# Patient Record
Sex: Male | Born: 1940 | Race: White | Hispanic: No | Marital: Married | Smoking: Former smoker
Health system: Southern US, Community
[De-identification: ages and names within clinical notes are randomized; demographics above are authoritative.]

## PROBLEM LIST (undated history)

## (undated) DIAGNOSIS — I2510 Atherosclerotic heart disease of native coronary artery without angina pectoris: Secondary | ICD-10-CM

## (undated) DIAGNOSIS — N19 Unspecified kidney failure: Secondary | ICD-10-CM

## (undated) DIAGNOSIS — N186 End stage renal disease: Secondary | ICD-10-CM

## (undated) DIAGNOSIS — Z992 Dependence on renal dialysis: Secondary | ICD-10-CM

## (undated) DIAGNOSIS — N289 Disorder of kidney and ureter, unspecified: Secondary | ICD-10-CM

## (undated) DIAGNOSIS — I351 Nonrheumatic aortic (valve) insufficiency: Secondary | ICD-10-CM

## (undated) DIAGNOSIS — E785 Hyperlipidemia, unspecified: Secondary | ICD-10-CM

## (undated) DIAGNOSIS — I10 Essential (primary) hypertension: Secondary | ICD-10-CM

## (undated) HISTORY — DX: Nonrheumatic aortic (valve) insufficiency: I35.1

## (undated) HISTORY — PX: FOOT SURGERY: SHX648

## (undated) HISTORY — DX: Hyperlipidemia, unspecified: E78.5

## (undated) HISTORY — PX: CHOLECYSTECTOMY: SHX55

## (undated) HISTORY — DX: Disorder of kidney and ureter, unspecified: N28.9

## (undated) HISTORY — DX: Essential (primary) hypertension: I10

## (undated) HISTORY — PX: HERNIA REPAIR: SHX51

## (undated) HISTORY — PX: APPENDECTOMY: SHX54

---

## 2009-05-24 HISTORY — PX: US ECHOCARDIOGRAPHY: HXRAD669

## 2009-06-08 HISTORY — PX: CARDIOVASCULAR STRESS TEST: SHX262

## 2009-09-10 ENCOUNTER — Emergency Department (HOSPITAL_BASED_OUTPATIENT_CLINIC_OR_DEPARTMENT_OTHER): Admission: EM | Admit: 2009-09-10 | Discharge: 2009-09-10 | Payer: Self-pay | Admitting: Emergency Medicine

## 2009-09-10 ENCOUNTER — Ambulatory Visit: Payer: Self-pay | Admitting: Interventional Radiology

## 2009-12-26 ENCOUNTER — Ambulatory Visit: Payer: Self-pay | Admitting: Cardiology

## 2010-03-28 ENCOUNTER — Ambulatory Visit: Payer: Self-pay | Admitting: Cardiology

## 2010-05-21 LAB — DIFFERENTIAL
Basophils Absolute: 0 10*3/uL (ref 0.0–0.1)
Basophils Relative: 1 % (ref 0–1)
Lymphs Abs: 1.8 10*3/uL (ref 0.7–4.0)
Monocytes Relative: 6 % (ref 3–12)

## 2010-05-21 LAB — POCT CARDIAC MARKERS: Troponin i, poc: <0.05 ng/mL (ref 0.00–0.09)

## 2010-05-21 LAB — BASIC METABOLIC PANEL
BUN: 18 mg/dL (ref 6–23)
CO2: 25 mEq/L (ref 19–32)
Chloride: 106 mEq/L (ref 96–112)
Creatinine, Ser: 1.7 mg/dL — ABNORMAL HIGH (ref 0.4–1.5)

## 2010-05-21 LAB — CBC
MCH: 32.9 pg (ref 26.0–34.0)
MCV: 97.7 fL (ref 78.0–100.0)
RDW: 11.9 % (ref 11.5–15.5)
WBC: 6.3 10*3/uL (ref 4.0–10.5)

## 2010-08-14 ENCOUNTER — Other Ambulatory Visit: Payer: Self-pay | Admitting: Cardiology

## 2010-08-14 MED ORDER — LOSARTAN POTASSIUM-HCTZ 100-25 MG PO TABS: 1.0000 | ORAL_TABLET | Freq: Every day | ORAL | Status: DC

## 2010-08-14 MED ORDER — AMLODIPINE BESYLATE 5 MG PO TABS: 5.0000 mg | ORAL_TABLET | Freq: Every day | ORAL | Status: DC

## 2010-08-14 NOTE — Telephone Encounter (Signed)
escribe medication per fax request  

## 2010-08-14 NOTE — Telephone Encounter (Signed)
PT NEEDS AMLODIPINE AND LOSARTAN REFILLED/USES [facility] AT [address] IN [place].

## 2010-09-27 ENCOUNTER — Encounter: Payer: Self-pay | Admitting: Cardiology

## 2010-09-29 ENCOUNTER — Encounter: Payer: Self-pay | Admitting: Cardiology

## 2010-09-29 ENCOUNTER — Ambulatory Visit (INDEPENDENT_AMBULATORY_CARE_PROVIDER_SITE_OTHER): Payer: Medicare Other | Admitting: Cardiology

## 2010-09-29 DIAGNOSIS — I359 Nonrheumatic aortic valve disorder, unspecified: Secondary | ICD-10-CM

## 2010-09-29 DIAGNOSIS — I10 Essential (primary) hypertension: Secondary | ICD-10-CM | POA: Insufficient documentation

## 2010-09-29 DIAGNOSIS — I351 Nonrheumatic aortic (valve) insufficiency: Secondary | ICD-10-CM

## 2010-09-29 DIAGNOSIS — E785 Hyperlipidemia, unspecified: Secondary | ICD-10-CM

## 2010-09-29 DIAGNOSIS — N289 Disorder of kidney and ureter, unspecified: Secondary | ICD-10-CM

## 2010-09-29 NOTE — Progress Notes (Signed)
   William [name] Date of Birth: [birth year]   History of Present Illness: William [name] is seen today for followup. He has done very well from a cardiac standpoint denies any symptoms of chest pain, shortness of breath, or palpitations. His blood pressure has been under good control. He states he is scheduled to see nephrology back in September and will have a renal ultrasound at that time.  Current Outpatient Prescriptions on File Prior to Visit  Medication Sig Dispense Refill  . amLODipine (NORVASC) 5 MG tablet Take 1 tablet (5 mg total) by mouth daily.  30 tablet  5  . Cholecalciferol (VITAMIN D PO) Take 200 Units by mouth daily.        . clonazePAM (KLONOPIN) 0.5 MG tablet Take 0.5 mg by mouth at bedtime.        . ranitidine (ZANTAC) 75 MG tablet Take 150 mg by mouth at bedtime.        Marland Kitchen DISCONTD: losartan-hydrochlorothiazide (HYZAAR) 100-25 MG per tablet Take 1 tablet by mouth daily.  30 tablet  5    Allergies  Allergen Reactions  . Lisinopril     Past Medical History  Diagnosis Date  . Aortic insufficiency   . Hypertension   . Renal insufficiency     WITH PROTEINURIA  . Hyperlipidemia     Past Surgical History  Procedure Date  . Cholecystectomy   . Appendectomy   . Hernia repair   . Foot surgery   . US echocardiography 05/24/2009    EF 60%  . Cardiovascular stress test 06/08/2009    EF 43%    History  Smoking status  . Former Smoker  . Quit date: 03/05/1966  Smokeless tobacco  . Not on file    History  Alcohol Use No    Family History  Problem Relation Age of Onset  . Cancer Mother   . Cirrhosis Father   . Diabetes Brother     Review of Systems: As noted in history of present illness.  All other systems were reviewed and are negative.  Physical Exam: BP 132/78  Pulse 60  Ht 6' (1.829 m)  Wt 179 lb (81.194 kg)  BMI 24.28 kg/m2 He is a pleasant white male in no acute distress. He is normocephalic, atraumatic. Pupils are round and reactive to light  accommodation. Extraocular movements are full. Oropharynx is clear. Neck is supple without JVD, adenopathy, thyromegaly, or bruits. Lungs are clear. Cardiac exam reveals a regular rate and rhythm. Normal S1-S2. No gallop, murmur, or click. Abdomen is soft and nontender. He has no hepatosplenomegaly. Femoral and pedal pulses are 2+. He has no edema. Neurologic exam is nonfocal. LABORATORY DATA:   Assessment / Plan:

## 2010-09-29 NOTE — Patient Instructions (Signed)
Continue your current medications.  We will see you again in 6 months.

## 2010-09-29 NOTE — Assessment & Plan Note (Signed)
Blood pressure control is excellent on his current medication.

## 2010-09-29 NOTE — Assessment & Plan Note (Signed)
Moderate aortic insufficiency was noted by an echocardiogram in March of 2011. His exam is benign. He is asymptomatic. His blood pressure control is optimal. Will continue on his current therapy and consider followup echocardiogram in one year.

## 2010-10-05 ENCOUNTER — Telehealth: Payer: Self-pay | Admitting: *Deleted

## 2010-10-05 NOTE — Telephone Encounter (Signed)
Faxed office note to Cristina Gong at [facility] center at [phone]

## 2010-11-29 ENCOUNTER — Emergency Department (INDEPENDENT_AMBULATORY_CARE_PROVIDER_SITE_OTHER): Payer: Medicare Other

## 2010-11-29 ENCOUNTER — Emergency Department (HOSPITAL_BASED_OUTPATIENT_CLINIC_OR_DEPARTMENT_OTHER)
Admission: EM | Admit: 2010-11-29 | Discharge: 2010-11-29 | Disposition: A | Discharge status: Home / Self Care | Payer: Medicare Other | Attending: Emergency Medicine | Admitting: Emergency Medicine

## 2010-11-29 ENCOUNTER — Encounter (HOSPITAL_BASED_OUTPATIENT_CLINIC_OR_DEPARTMENT_OTHER): Payer: Self-pay | Admitting: *Deleted

## 2010-11-29 ENCOUNTER — Other Ambulatory Visit: Payer: Self-pay

## 2010-11-29 DIAGNOSIS — R079 Chest pain, unspecified: Secondary | ICD-10-CM | POA: Insufficient documentation

## 2010-11-29 DIAGNOSIS — I359 Nonrheumatic aortic valve disorder, unspecified: Secondary | ICD-10-CM

## 2010-11-29 DIAGNOSIS — R0789 Other chest pain: Secondary | ICD-10-CM

## 2010-11-29 DIAGNOSIS — I10 Essential (primary) hypertension: Secondary | ICD-10-CM

## 2010-11-29 LAB — BASIC METABOLIC PANEL
CO2: 24 mEq/L (ref 19–32)
Calcium: 9.5 mg/dL (ref 8.4–10.5)
Chloride: 101 mEq/L (ref 96–112)
Creatinine, Ser: 1.9 mg/dL — ABNORMAL HIGH (ref 0.50–1.35)
GFR calc Af Amer: 43 mL/min — ABNORMAL LOW (ref >60)
GFR calc non Af Amer: 35 mL/min — ABNORMAL LOW (ref >60)
Glucose, Bld: 89 mg/dL (ref 70–99)
Potassium: 4.2 mEq/L (ref 3.5–5.1)

## 2010-11-29 LAB — CARDIAC PANEL(CRET KIN+CKTOT+MB+TROPI)
CK, MB: 3.2 ng/mL (ref 0.3–4.0)
Total CK: 182 U/L (ref 7–232)

## 2010-11-29 LAB — CBC
RBC: 4.29 MIL/uL (ref 4.22–5.81)
WBC: 6.5 10*3/uL (ref 4.0–10.5)

## 2010-11-29 NOTE — ED Notes (Signed)
Pt sts "normal heart rate is in the 50s".

## 2010-11-29 NOTE — ED Provider Notes (Signed)
History     CSN: [id number] Arrival date & time: 11/29/2010 12:44 PM  Chief Complaint  Patient presents with  . Chest Pain    (Consider location/radiation/quality/duration/timing/severity/associated sxs/prior treatment) HPI Comments: Patient presents with intermittent left-sided chest pain since 9 AM this morning. He notes that it actually improves with walking around. He was driving around in his truck when it started and he took a Zantac. He noted some symptom improvement but his symptoms did return. He did take another Zantac. At this point in time he has no chest pain. He has not had symptoms quite like this before. He has no associated shortness of breath, nausea, diaphoresis. No recent cough or fevers. No new leg swelling or leg pain.  Patient is a 70 y.o. male presenting with chest pain. The history is provided by the patient. No language interpreter was used.  Chest Pain The chest pain began 3 - 5 hours ago. Chest pain occurs intermittently. The chest pain is resolved. The severity of the pain is mild. The quality of the pain is described as aching. The pain does not radiate. Pertinent negatives for primary symptoms include no fever, no fatigue, no syncope, no shortness of breath, no cough, no wheezing, no palpitations, no abdominal pain, no nausea, no vomiting, no dizziness and no altered mental status.  Associated symptoms include weakness.  Pertinent negatives for associated symptoms include no claudication, no diaphoresis, no lower extremity edema, no near-syncope, no numbness, no orthopnea and no paroxysmal nocturnal dyspnea. He tried histamine-2 blockers for the symptoms. Risk factors include male gender.     Past Medical History  Diagnosis Date  . Aortic insufficiency   . Hypertension   . Renal insufficiency     WITH PROTEINURIA  . Hyperlipidemia     Past Surgical History  Procedure Date  . Cholecystectomy   . Appendectomy   . Hernia repair   . Foot surgery   . US  echocardiography 05/24/2009    EF 60%  . Cardiovascular stress test 06/08/2009    EF 43%    Family History  Problem Relation Age of Onset  . Cancer Mother   . Cirrhosis Father   . Diabetes Brother     History  Substance Use Topics  . Smoking status: Former Smoker    Quit date: 03/05/1966  . Smokeless tobacco: Not on file  . Alcohol Use: No      Review of Systems  Constitutional: Negative for fever, chills, diaphoresis and fatigue.  HENT: Negative.   Eyes: Negative.  Negative for discharge and redness.  Respiratory: Negative.  Negative for cough, shortness of breath and wheezing.   Cardiovascular: Positive for chest pain. Negative for palpitations, orthopnea, claudication, syncope and near-syncope.  Gastrointestinal: Negative.  Negative for nausea, vomiting and abdominal pain.  Genitourinary: Negative.  Negative for hematuria.  Musculoskeletal: Negative.  Negative for back pain.  Skin: Negative.  Negative for color change and rash.  Neurological: Positive for weakness. Negative for dizziness, syncope, numbness and headaches.       Patient notes some generalized weakness but no focal weakness.  Hematological: Negative.  Negative for adenopathy.  Psychiatric/Behavioral: Negative.  Negative for confusion and altered mental status.  All other systems reviewed and are negative.    Allergies  Lisinopril  Home Medications   Current Outpatient Rx  Name Route Sig Dispense Refill  . AMLODIPINE BESYLATE 5 MG PO TABS Oral Take 1 tablet (5 mg total) by mouth daily. 30 tablet 5  . VITAMIN D  PO Oral Take 200 Units by mouth daily.      Marland Kitchen CLONAZEPAM 0.5 MG PO TABS Oral Take 0.5 mg by mouth at bedtime.      Marland Kitchen LOSARTAN POTASSIUM-HCTZ 100-25 MG PO TABS Oral Take 1 tablet by mouth daily. Takes 1/2 tablet daily     . RANITIDINE HCL 75 MG PO TABS Oral Take 150 mg by mouth at bedtime.        BP 163/72  Pulse 56  Temp(Src) 98.2 F (36.8 C) (Oral)  Resp 18  Ht 6' (1.829 m)  Wt 172 lb  (78.019 kg)  BMI 23.33 kg/m2  SpO2 100%  Physical Exam  Constitutional: He is oriented to person, place, and time. He appears well-developed and well-nourished.  Non-toxic appearance. He does not have a sickly appearance.  HENT:  Head: Normocephalic and atraumatic.  Eyes: Conjunctivae, EOM and lids are normal. Pupils are equal, round, and reactive to light.  Neck: Trachea normal, normal range of motion and full passive range of motion without pain. Neck supple.  Cardiovascular: Normal rate, regular rhythm and normal heart sounds.   Pulmonary/Chest: Effort normal and breath sounds normal. No respiratory distress. He has no wheezes. He has no rales. He exhibits no tenderness.  Abdominal: Soft. Normal appearance. He exhibits no distension. There is no tenderness. There is no rebound and no CVA tenderness.  Musculoskeletal: Normal range of motion.  Neurological: He is alert and oriented to person, place, and time. He has normal strength.  Skin: Skin is warm, dry and intact. No rash noted.  Psychiatric: He has a normal mood and affect. His behavior is normal. Judgment and thought content normal.    ED Course  Procedures (including critical care time) Results for orders placed during the hospital encounter of 11/29/10  CBC      Component Value Range   WBC 6.5  4.0 - 10.5 (K/uL)   RBC 4.29  4.22 - 5.81 (MIL/uL)   Hemoglobin 14.2  13.0 - 17.0 (g/dL)   HCT 40.7  39.0 - 52.0 (%)   MCV 94.9  78.0 - 100.0 (fL)   MCH 33.1  26.0 - 34.0 (pg)   MCHC 34.9  30.0 - 36.0 (g/dL)   RDW 12.1  11.5 - 15.5 (%)   Platelets 202  150 - 400 (K/uL)  CARDIAC PANEL(CRET KIN+CKTOT+MB+TROPI)      Component Value Range   Total CK 182  7 - 232 (U/L)   CK, MB 3.2  0.3 - 4.0 (ng/mL)   Troponin I <0.30  <0.30 (ng/mL)   Relative Index 1.8  0.0 - 2.5   BASIC METABOLIC PANEL      Component Value Range   Sodium 136  135 - 145 (mEq/L)   Potassium 4.2  3.5 - 5.1 (mEq/L)   Chloride 101  96 - 112 (mEq/L)   CO2 24  19 -  32 (mEq/L)   Glucose, Bld 89  70 - 99 (mg/dL)   BUN 29 (*) 6 - 23 (mg/dL)   Creatinine, Ser 1.90 (*) 0.50 - 1.35 (mg/dL)   Calcium 9.5  8.4 - 10.5 (mg/dL)   GFR calc non Af Amer 35 (*) >60 (mL/min)   GFR calc Af Amer 43 (*) >60 (mL/min)   Dg Chest 2 View  11/29/2010  *RADIOLOGY REPORT*  Clinical Data: Left-sided chest pain.  Hypertension.  Aortic insufficiency.  CHEST - 2 VIEW  Comparison: 09/10/2009  Findings: Heart size is within normal limits.  Both lungs are clear.  No  evidence of pleural effusion.  No mass or lymphadenopathy identified.  No significant change seen compared to prior exam.  IMPRESSION: Stable exam.  No active disease.  Original Report Authenticated By: Marlaine Hind, M.D.       Date: 11/29/2010  Rate: 54  Rhythm: sinus bradycardia  QRS Axis: normal  Intervals: normal  ST/T Wave abnormalities: normal  Conduction Disutrbances:none  Narrative Interpretation:   Old EKG Reviewed: none available    MDM  Patient with symptoms that are atypical for ACS. They actually improve with exertion and are intermittent in origin. Patient also has no past history of coronary artery disease or MI. They are not typical for PE as they are not pleuritic and he has no specific risk factors by Wells criteria for PE. Patient has no symptoms for pneumonia. His symptoms are not characteristic for aortic dissection. Patient's EKG is normal and shows no signs of acute ischemia. Patient actually has no pain at the current time. He may have symptoms related to his known acid reflux disease. I will check a set of cardiac markers to further rule out MI but again this is unlikely.   Patient has normal laboratory studies at this point in time except an elevated BUN and creatinine. Evaluation of past medical records patient had creatinine of 1.7 on 09/10/2009. Patient does see a nephrologist regarding his renal insufficiency so this is not a new abnormality for him. Patient has normal cardiac markers  and no anemia. He has no acute changes on his chest x-ray. As noted previously this is unlikely to be ACS patient has had no pain while here in the emergency department. His symptoms may be due to acid reflux and at this point in time I feel he is safe for discharge home with followup with his primary care physician and/or his cardiologist as an outpatient.     Lezlie Octave, MD 11/29/10 [phone]

## 2010-11-29 NOTE — ED Notes (Signed)
Pt declines w/c, amb to room 7 with quick steady gait in nad. Reports sudden onset of sharp left sided chest pain at 9am, intermittant, improves with activity. Denies any sob or other c/o.

## 2010-11-29 NOTE — ED Notes (Signed)
Pt denies pain, shob, dizziness, n/v at present

## 2011-03-08 ENCOUNTER — Ambulatory Visit (INDEPENDENT_AMBULATORY_CARE_PROVIDER_SITE_OTHER): Payer: Medicare Other | Admitting: Cardiology

## 2011-03-08 ENCOUNTER — Encounter: Payer: Self-pay | Admitting: Cardiology

## 2011-03-08 VITALS — BP 120/60 | HR 55 | Ht 72.0 in | Wt 183.4 lb

## 2011-03-08 DIAGNOSIS — I10 Essential (primary) hypertension: Secondary | ICD-10-CM

## 2011-03-08 DIAGNOSIS — N289 Disorder of kidney and ureter, unspecified: Secondary | ICD-10-CM

## 2011-03-08 DIAGNOSIS — I359 Nonrheumatic aortic valve disorder, unspecified: Secondary | ICD-10-CM

## 2011-03-08 DIAGNOSIS — I351 Nonrheumatic aortic (valve) insufficiency: Secondary | ICD-10-CM

## 2011-03-08 MED ORDER — LOSARTAN POTASSIUM-HCTZ 100-25 MG PO TABS: 0.5000 | ORAL_TABLET | Freq: Every day | ORAL | Status: DC

## 2011-03-08 MED ORDER — AMLODIPINE BESYLATE 5 MG PO TABS: 5.0000 mg | ORAL_TABLET | Freq: Every day | ORAL | Status: DC

## 2011-03-08 NOTE — Assessment & Plan Note (Signed)
Blood pressure is under excellent control. I have renewed his losartan HCT and amlodipine today.

## 2011-03-08 NOTE — Progress Notes (Signed)
   William [name] Date of Birth: [birth year]   History of Present Illness: William [name] is seen today for followup. He has done very well from a cardiac standpoint denies any symptoms of chest pain, shortness of breath, or palpitations. His blood pressure has been under good control. He states he is saw nephrology back in September and that his creatinine was stable. His most recent lab work showed a creatinine of 2.1. He had complete lab work done in December was told that everything else looked okay. He remains active walking on a treadmill 3 days a week. He is also active on his job.  Current Outpatient Prescriptions on File Prior to Visit  Medication Sig Dispense Refill  . Cholecalciferol (VITAMIN D PO) Take 200 Units by mouth daily.        . ranitidine (ZANTAC) 75 MG tablet Take 150 mg by mouth at bedtime.        Marland Kitchen DISCONTD: amLODipine (NORVASC) 5 MG tablet Take 1 tablet (5 mg total) by mouth daily.  30 tablet  5  . DISCONTD: losartan-hydrochlorothiazide (HYZAAR) 100-25 MG per tablet Take by mouth daily. Takes 1/2 tablet daily      . DISCONTD: losartan-hydrochlorothiazide (HYZAAR) 100-25 MG per tablet Take 1 tablet by mouth daily.  30 tablet  5    Allergies  Allergen Reactions  . Lisinopril     Past Medical History  Diagnosis Date  . Aortic insufficiency   . Hypertension   . Renal insufficiency     WITH PROTEINURIA, creatnine 2.1  . Hyperlipidemia     Past Surgical History  Procedure Date  . Cholecystectomy   . Appendectomy   . Hernia repair   . Foot surgery   . US echocardiography 05/24/2009    EF 60%  . Cardiovascular stress test 06/08/2009    EF 43%    History  Smoking status  . Former Smoker  . Quit date: 03/05/1966  Smokeless tobacco  . Not on file    History  Alcohol Use No    Family History  Problem Relation Age of Onset  . Cancer Mother   . Cirrhosis Father   . Diabetes Brother     Review of Systems: As noted in history of present illness.  All  other systems were reviewed and are negative.  Physical Exam: BP 120/60  Pulse 55  Ht 6' (1.829 m)  Wt 183 lb 6.4 oz (83.19 kg)  BMI 24.87 kg/m2 He is a pleasant white male in no acute distress. He is normocephalic, atraumatic. Pupils are round and reactive to light accommodation. Extraocular movements are full. Oropharynx is clear. Neck is supple without JVD, adenopathy, thyromegaly, or bruits. Lungs are clear. Cardiac exam reveals a regular rate and rhythm. Normal S1-S2. No gallop, murmur, or click. Abdomen is soft and nontender. He has no hepatosplenomegaly. Femoral and pedal pulses are 2+. He has no edema. Neurologic exam is nonfocal. LABORATORY DATA:   Assessment / Plan:

## 2011-03-08 NOTE — Assessment & Plan Note (Signed)
Creatinine stable at 2.1.

## 2011-03-08 NOTE — Patient Instructions (Signed)
We will schedule you for an Echocardiogram.  Continue your current medication.  I will see you again in 6 months.

## 2011-03-08 NOTE — Assessment & Plan Note (Signed)
He has a history of moderate aortic insufficiency with last echocardiogram in March of 2011. We will repeat his echocardiogram at this time. Continue with his current antihypertensive therapy.

## 2011-03-12 ENCOUNTER — Other Ambulatory Visit (HOSPITAL_COMMUNITY): Payer: Medicare Other | Admitting: Radiology

## 2011-03-16 ENCOUNTER — Other Ambulatory Visit (HOSPITAL_COMMUNITY): Payer: Medicare Other | Admitting: Radiology

## 2011-03-23 ENCOUNTER — Other Ambulatory Visit (HOSPITAL_COMMUNITY): Payer: Medicare Other | Admitting: Radiology

## 2011-03-29 ENCOUNTER — Ambulatory Visit (HOSPITAL_COMMUNITY): Payer: Medicare Other | Attending: Cardiology | Admitting: Radiology

## 2011-03-29 DIAGNOSIS — Z87891 Personal history of nicotine dependence: Secondary | ICD-10-CM | POA: Insufficient documentation

## 2011-03-29 DIAGNOSIS — I351 Nonrheumatic aortic (valve) insufficiency: Secondary | ICD-10-CM

## 2011-03-29 DIAGNOSIS — I10 Essential (primary) hypertension: Secondary | ICD-10-CM | POA: Insufficient documentation

## 2011-03-29 DIAGNOSIS — E785 Hyperlipidemia, unspecified: Secondary | ICD-10-CM | POA: Insufficient documentation

## 2011-03-29 DIAGNOSIS — I359 Nonrheumatic aortic valve disorder, unspecified: Secondary | ICD-10-CM

## 2013-10-15 DIAGNOSIS — R972 Elevated prostate specific antigen [PSA]: Secondary | ICD-10-CM | POA: Insufficient documentation

## 2013-10-15 DIAGNOSIS — N401 Enlarged prostate with lower urinary tract symptoms: Secondary | ICD-10-CM | POA: Insufficient documentation

## 2013-10-26 DIAGNOSIS — M810 Age-related osteoporosis without current pathological fracture: Secondary | ICD-10-CM | POA: Insufficient documentation

## 2014-07-01 DIAGNOSIS — N186 End stage renal disease: Secondary | ICD-10-CM | POA: Insufficient documentation

## 2014-07-01 DIAGNOSIS — N184 Chronic kidney disease, stage 4 (severe): Secondary | ICD-10-CM | POA: Insufficient documentation

## 2014-07-21 DIAGNOSIS — M79604 Pain in right leg: Secondary | ICD-10-CM | POA: Insufficient documentation

## 2014-12-30 ENCOUNTER — Encounter (HOSPITAL_BASED_OUTPATIENT_CLINIC_OR_DEPARTMENT_OTHER): Payer: Self-pay | Admitting: Emergency Medicine

## 2014-12-30 ENCOUNTER — Emergency Department (HOSPITAL_BASED_OUTPATIENT_CLINIC_OR_DEPARTMENT_OTHER): Payer: Medicare HMO

## 2014-12-30 ENCOUNTER — Emergency Department (HOSPITAL_BASED_OUTPATIENT_CLINIC_OR_DEPARTMENT_OTHER)
Admission: EM | Admit: 2014-12-30 | Discharge: 2014-12-30 | Disposition: A | Discharge status: Home / Self Care | Payer: Medicare HMO | Attending: Emergency Medicine | Admitting: Emergency Medicine

## 2014-12-30 DIAGNOSIS — J159 Unspecified bacterial pneumonia: Secondary | ICD-10-CM | POA: Insufficient documentation

## 2014-12-30 DIAGNOSIS — R1013 Epigastric pain: Secondary | ICD-10-CM

## 2014-12-30 DIAGNOSIS — R0602 Shortness of breath: Secondary | ICD-10-CM | POA: Diagnosis present

## 2014-12-30 DIAGNOSIS — Z87448 Personal history of other diseases of urinary system: Secondary | ICD-10-CM | POA: Insufficient documentation

## 2014-12-30 DIAGNOSIS — Z79899 Other long term (current) drug therapy: Secondary | ICD-10-CM | POA: Insufficient documentation

## 2014-12-30 DIAGNOSIS — K219 Gastro-esophageal reflux disease without esophagitis: Secondary | ICD-10-CM | POA: Insufficient documentation

## 2014-12-30 DIAGNOSIS — E785 Hyperlipidemia, unspecified: Secondary | ICD-10-CM | POA: Diagnosis not present

## 2014-12-30 DIAGNOSIS — J189 Pneumonia, unspecified organism: Secondary | ICD-10-CM

## 2014-12-30 DIAGNOSIS — I10 Essential (primary) hypertension: Secondary | ICD-10-CM | POA: Insufficient documentation

## 2014-12-30 DIAGNOSIS — Z87891 Personal history of nicotine dependence: Secondary | ICD-10-CM | POA: Insufficient documentation

## 2014-12-30 LAB — URINALYSIS, ROUTINE W REFLEX MICROSCOPIC
BILIRUBIN URINE: NEGATIVE
GLUCOSE, UA: NEGATIVE mg/dL
KETONES UR: NEGATIVE mg/dL
NITRITE: NEGATIVE
PH: 5.5 (ref 5.0–8.0)
Protein, ur: 100 mg/dL — AB
SPECIFIC GRAVITY, URINE: 1.013 (ref 1.005–1.030)
Urobilinogen, UA: 0.2 mg/dL (ref 0.0–1.0)

## 2014-12-30 LAB — CBC
HCT: 38.2 % — ABNORMAL LOW (ref 39.0–52.0)
Hemoglobin: 13 g/dL (ref 13.0–17.0)
MCH: 33.2 pg (ref 26.0–34.0)
MCHC: 34 g/dL (ref 30.0–36.0)
MCV: 97.7 fL (ref 78.0–100.0)
PLATELETS: 241 10*3/uL (ref 150–400)
RBC: 3.91 MIL/uL — AB (ref 4.22–5.81)
RDW: 12.1 % (ref 11.5–15.5)
WBC: 8 10*3/uL (ref 4.0–10.5)

## 2014-12-30 LAB — URINE MICROSCOPIC-ADD ON

## 2014-12-30 LAB — HEPATIC FUNCTION PANEL
ALK PHOS: 50 U/L (ref 38–126)
ALT: 18 U/L (ref 17–63)
AST: 25 U/L (ref 15–41)
Albumin: 4.1 g/dL (ref 3.5–5.0)
BILIRUBIN INDIRECT: 0.7 mg/dL (ref 0.3–0.9)
BILIRUBIN TOTAL: 0.8 mg/dL (ref 0.3–1.2)
Bilirubin, Direct: 0.1 mg/dL (ref 0.1–0.5)
TOTAL PROTEIN: 6.9 g/dL (ref 6.5–8.1)

## 2014-12-30 LAB — BASIC METABOLIC PANEL
Anion gap: 8 (ref 5–15)
BUN: 34 mg/dL — AB (ref 6–20)
CALCIUM: 8.6 mg/dL — AB (ref 8.9–10.3)
CHLORIDE: 107 mmol/L (ref 101–111)
CO2: 24 mmol/L (ref 22–32)
CREATININE: 2.55 mg/dL — AB (ref 0.61–1.24)
GFR calc non Af Amer: 23 mL/min — ABNORMAL LOW (ref >60)
GFR, EST AFRICAN AMERICAN: 27 mL/min — AB (ref >60)
GLUCOSE: 97 mg/dL (ref 65–99)
Potassium: 4 mmol/L (ref 3.5–5.1)
Sodium: 139 mmol/L (ref 135–145)

## 2014-12-30 LAB — TROPONIN I

## 2014-12-30 LAB — LIPASE, BLOOD: Lipase: 31 U/L (ref 11–51)

## 2014-12-30 LAB — I-STAT CG4 LACTIC ACID, ED: Lactic Acid, Venous: 0.87 mmol/L (ref 0.5–2.0)

## 2014-12-30 MED ORDER — ONDANSETRON HCL 4 MG/2ML IJ SOLN
4.0000 mg | Freq: Once | INTRAMUSCULAR | Status: AC
  Administered 2014-12-30: 4 mg via INTRAVENOUS
  Filled 2014-12-30: qty 2

## 2014-12-30 MED ORDER — LEVOFLOXACIN 750 MG PO TABS: 750.0000 mg | ORAL_TABLET | Freq: Every day | ORAL | Status: DC

## 2014-12-30 MED ORDER — HYDROMORPHONE HCL 1 MG/ML IJ SOLN
0.5000 mg | Freq: Once | INTRAMUSCULAR | Status: AC
  Administered 2014-12-30: 0.5 mg via INTRAVENOUS
  Filled 2014-12-30: qty 1

## 2014-12-30 MED ORDER — LEVOFLOXACIN 750 MG PO TABS
750.0000 mg | ORAL_TABLET | Freq: Every day | ORAL | Status: DC
  Administered 2014-12-30: 750 mg via ORAL
  Filled 2014-12-30: qty 1

## 2014-12-30 MED ORDER — SODIUM CHLORIDE 0.9 % IV BOLUS (SEPSIS)
1000.0000 mL | Freq: Once | INTRAVENOUS | Status: AC
  Administered 2014-12-30: 1000 mL via INTRAVENOUS

## 2014-12-30 MED ORDER — DEXAMETHASONE SODIUM PHOSPHATE 10 MG/ML IJ SOLN
10.0000 mg | Freq: Once | INTRAMUSCULAR | Status: AC
  Administered 2014-12-30: 10 mg via INTRAVENOUS
  Filled 2014-12-30: qty 1

## 2014-12-30 MED ORDER — OXYCODONE-ACETAMINOPHEN 5-325 MG PO TABS
1.0000 | ORAL_TABLET | Freq: Once | ORAL | Status: AC
  Administered 2014-12-30: 1 via ORAL
  Filled 2014-12-30: qty 1

## 2014-12-30 MED ORDER — ONDANSETRON 4 MG PO TBDP: 4.0000 mg | ORAL_TABLET | Freq: Three times a day (TID) | ORAL | Status: DC | PRN

## 2014-12-30 MED ORDER — OXYCODONE-ACETAMINOPHEN 5-325 MG PO TABS: 1.0000 | ORAL_TABLET | ORAL | Status: DC | PRN

## 2014-12-30 NOTE — Discharge Instructions (Signed)

## 2014-12-30 NOTE — ED Notes (Signed)
Pt c/o SOB with throat tightening x 1 hour-received PNE booster this am-pt NAD but is coughing

## 2014-12-30 NOTE — ED Notes (Signed)
Patient with no pain at this time. The patient states that it resolved when he got up and walked around.

## 2014-12-31 NOTE — ED Provider Notes (Signed)
CSN: [id number]     Arrival date & time 12/30/14  1451 History   First MD Initiated Contact with Patient 12/30/14 1500     Chief Complaint  Patient presents with  . Shortness of Breath     (Consider location/radiation/quality/duration/timing/severity/associated sxs/prior Treatment) HPI Comments: 74 year old male with past medical history including hypertension, HLD, CKD, aortic insufficiency who presents with throat pain and nausea. Patient states that this morning at 10 AM he had a pneumonia booster and flu vaccination. This afternoon around 2 PM, he was working in his garage when he had a sudden onset of his throat feeling raw with a foreign body sensation when he swallowed. He also began having nausea. He came in for concern for allergic reaction to the medications. He denies any chest pain, shortness of breath, rash, or focal swelling. No abdominal pain or vomiting. 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 He reports several weeks of cough/cold symptoms for which his PCP has written a Z-Pak but patient has not started it yet. No fevers. He has had these vaccinations in the past with no difficulty.  Patient is a 74 y.o. male presenting with shortness of breath. The history is provided by the patient.  Shortness of Breath   Past Medical History  Diagnosis Date  . Aortic insufficiency   . Hypertension   . Renal insufficiency     WITH PROTEINURIA, creatnine 2.1  . Hyperlipidemia    Past Surgical History  Procedure Laterality Date  . Cholecystectomy    . Appendectomy    . Hernia repair    . Foot surgery    . US echocardiography  05/24/2009    EF 60%  . Cardiovascular stress test  06/08/2009    EF 43%   Family History  Problem Relation Age of Onset  . Cancer Mother   . Cirrhosis Father   . Diabetes Brother    Social History  Substance Use Topics  . Smoking status: Former Smoker    Quit date: 03/05/1966  . Smokeless tobacco: None  . Alcohol Use: No    Review of Systems   Respiratory: Positive for shortness of breath.     10 Systems reviewed and are negative for acute change except as noted in the HPI.   Allergies  Lisinopril  Home Medications   Prior to Admission medications   Medication Sig Start Date End Date Taking? Authorizing Provider  hydrochlorothiazide (MICROZIDE) 12.5 MG capsule Take 12.5 mg by mouth daily.   Yes Historical Provider, MD  losartan (COZAAR) 50 MG tablet Take 50 mg by mouth daily.   Yes Historical Provider, MD  omeprazole (PRILOSEC) 20 MG capsule Take 20 mg by mouth daily.   Yes Historical Provider, MD  tadalafil (CIALIS) 5 MG tablet Take 5 mg by mouth daily as needed for erectile dysfunction.   Yes Historical Provider, MD  amLODipine (NORVASC) 5 MG tablet Take 1 tablet (5 mg total) by mouth daily. 03/08/11 03/07/12  Peter M Martinique, MD  Cholecalciferol (VITAMIN D PO) Take 200 Units by mouth daily.      Historical Provider, MD  levofloxacin (LEVAQUIN) 750 MG tablet Take 1 tablet (750 mg total) by mouth daily. 12/30/14   Sharlett Iles, MD  losartan-hydrochlorothiazide (HYZAAR) 100-25 MG per tablet Take 0.5 tablets by mouth daily. Takes 1/2 tablet daily 03/08/11 03/07/12  Peter M Martinique, MD  ondansetron (ZOFRAN ODT) 4 MG disintegrating tablet Take 1 tablet (4 mg total) by mouth every 8 (eight) hours as needed for nausea or  vomiting. 12/30/14   Sharlett Iles, MD  oxyCODONE-acetaminophen (PERCOCET) 5-325 MG tablet Take 1-2 tablets by mouth every 4 (four) hours as needed. 12/30/14   Sharlett Iles, MD  ranitidine (ZANTAC) 75 MG tablet Take 150 mg by mouth at bedtime.      Historical Provider, MD   BP 139/76 mmHg  Pulse 68  Temp(Src) 99.1 F (37.3 C) (Oral)  Resp 20  Ht 6' (1.829 m)  Wt 175 lb (79.379 kg)  BMI 23.73 kg/m2  SpO2 95% Physical Exam  Constitutional: He is oriented to person, place, and time. He appears well-developed and well-nourished. No distress.  HENT:  Head: Normocephalic and atraumatic.  Nose:  Nose normal.  Mouth/Throat: Oropharynx is clear and moist.  Moist mucous membranes  Eyes: Conjunctivae are normal. Pupils are equal, round, and reactive to light.  Neck: Neck supple. No tracheal deviation present.  Cardiovascular: Normal rate, regular rhythm and normal heart sounds.   No murmur heard. Pulmonary/Chest: Effort normal and breath sounds normal. No stridor.  Abdominal: Soft. Bowel sounds are normal. He exhibits no distension. There is no tenderness.  Musculoskeletal: He exhibits no edema.  Neurological: He is alert and oriented to person, place, and time.  Fluent speech  Skin: Skin is warm and dry. No rash noted.  Psychiatric: He has a normal mood and affect. Judgment normal.  Nursing note and vitals reviewed.   ED Course  Procedures (including critical care time) Labs Review Labs Reviewed  BASIC METABOLIC PANEL - Abnormal; Notable for the following:    BUN 34 (*)    Creatinine, Ser 2.55 (*)    Calcium 8.6 (*)    GFR calc non Af Amer 23 (*)    GFR calc Af Amer 27 (*)    All other components within normal limits  CBC - Abnormal; Notable for the following:    RBC 3.91 (*)    HCT 38.2 (*)    All other components within normal limits  URINALYSIS, ROUTINE W REFLEX MICROSCOPIC (NOT AT [facility]) - Abnormal; Notable for the following:    APPearance CLOUDY (*)    Hgb urine dipstick TRACE (*)    Protein, ur 100 (*)    Leukocytes, UA LARGE (*)    All other components within normal limits  URINE MICROSCOPIC-ADD ON - Abnormal; Notable for the following:    Bacteria, UA FEW (*)    All other components within normal limits  TROPONIN I  LIPASE, BLOOD  HEPATIC FUNCTION PANEL  I-STAT CG4 LACTIC ACID, ED    Imaging Review Ct Abdomen Pelvis Wo Contrast  12/30/2014  ADDENDUM REPORT: 12/30/2014 19:35 ADDENDUM: Small parapelvic cysts are noted in both kidneys. There is also small exophytic cyst arising from the lower pole of the left kidney. Otherwise, the kidneys have normal  appearances. Electronically Signed   By: Claudie Revering M.D.   On: 12/30/2014 19:35  12/30/2014  CLINICAL DATA:  Sudden onset severe epigastric abdominal pain and nausea. Cough with some shortness of breath for the past 3 weeks. Increased density at the right lung base on chest radiographs earlier today. EXAM: CT ABDOMEN AND PELVIS WITHOUT CONTRAST TECHNIQUE: Multidetector CT imaging of the abdomen and pelvis was performed following the standard protocol without IV contrast. COMPARISON:  Chest radiographs obtained earlier today. FINDINGS: Small amount of patchy and linear density at the right lung base, not included in its entirety. Cholecystectomy clips. Calcified splenic granulomata. Unremarkable non contrasted appearance of the liver, pancreas, adrenal glands, ureters and urinary  bladder. Moderately enlarged prostate gland. Multiple sigmoid colon diverticula. No gastric or small bowel abnormalities. Atheromatous arterial calcifications. Lumbar and lower thoracic spine degenerative changes. IMPRESSION: 1. Small amount of atelectasis and probable pneumonia at the right lung base. 2. No acute abdomen or pelvic abnormality. 3. Sigmoid diverticulosis. 4. Moderately enlarged prostate gland. Electronically Signed: By: Claudie Revering M.D. On: 12/30/2014 17:32   Dg Chest 2 View  12/30/2014  CLINICAL DATA:  Shortness of breath with throat tightening. Cough for 3 weeks. EXAM: CHEST  2 VIEW COMPARISON:  11/29/2010 FINDINGS: Slightly prominent lung markings at the right lung base could represent normal markings versus mild atelectasis. No significant airspace disease. Heart and mediastinum are within normal limits. Trachea is midline. No pleural effusions. No acute bone abnormality. IMPRESSION: Few densities at the right lung base as described. No significant airspace disease. Electronically Signed   By: Markus Daft M.D.   On: 12/30/2014 15:41   I have personally reviewed and evaluated these lab results as part of my  medical decision-making.   EKG Interpretation   Date/Time:  Thursday December 30 2014 15:05:38 EDT Ventricular Rate:  69 PR Interval:  158 QRS Duration: 108 QT Interval:  420 QTC Calculation: 450 R Axis:   26 Text Interpretation:  Normal sinus rhythm Normal ECG No significant change  since last tracing Confirmed by LITTLE MD, RACHEL [id number]) on 12/30/2014  4:04:51 PM     Medications  ondansetron (ZOFRAN) injection 4 mg (4 mg Intravenous Given 12/30/14 1545)  dexamethasone (DECADRON) injection 10 mg (10 mg Intravenous Given 12/30/14 1546)  ondansetron (ZOFRAN) injection 4 mg (4 mg Intravenous Given 12/30/14 1632)  HYDROmorphone (DILAUDID) injection 0.5 mg (0.5 mg Intravenous Given 12/30/14 1633)  sodium chloride 0.9 % bolus 1,000 mL (0 mLs Intravenous Stopped 12/30/14 1944)  HYDROmorphone (DILAUDID) injection 0.5 mg (0.5 mg Intravenous Given 12/30/14 1742)  oxyCODONE-acetaminophen (PERCOCET/ROXICET) 5-325 MG per tablet 1 tablet (1 tablet Oral Given 12/30/14 1947)    MDM   Final diagnoses:  Community acquired pneumonia  Epigastric pain   74 year old male who presents with sudden onset of throat soreness and nausea that began this afternoon in the setting of receiving vaccinations this morning. Patient uncomfortable but in no acute distress at presentation. Vital signs unremarkable. O2 sat 100% on room air. No hives or facial edema noted on exam. EKG was unremarkable. Chest x-ray without acute process. Because of the patient's description of scratchy throat and nausea, ddx does include reaction to medications. Gave pt decadron.   Shortly after my evaluation, the patient began having severe midepigastric abdominal pain and was in moderate distress on exam. Gave him Zofran and Dilaudid and obtained above labs. Cr 2.5; pt aware of worsening CKD. Otherwise troponin, lipase, and LFTs normal. Because of severity of patient's upper abdominal pain and sudden onset, obtained CT to evaluate for  acute process such as obstruction. Also obtained a lactate to evaluate for mesenteric ischemia. Lactate was normal and CT showed no intra-abdominal process. CT did show probable right lower lung pneumonia. Gave the patient Levaquin. On reexamination, the patient was comfortable and stated that his symptoms had improved. He has demonstrated no vital sign abnormalities, rash, or angioedema to suggest anaphylaxis. I have provided him with Levaquin instead of z-pack given comorbidities and instructed to follow closely with PCP as well as nephrologist. Instructed to use Benadryl and Pepcid at home for the next few days in the event that he did have an adverse reaction to medication. Extensively reviewed return  precautions and patient voice understanding. Patient discharged in satisfactory condition.    Sharlett Iles, MD 12/31/14 [phone]

## 2015-02-16 ENCOUNTER — Encounter: Payer: Self-pay | Admitting: Podiatry

## 2015-02-16 ENCOUNTER — Ambulatory Visit (INDEPENDENT_AMBULATORY_CARE_PROVIDER_SITE_OTHER): Payer: Medicare HMO

## 2015-02-16 ENCOUNTER — Ambulatory Visit (INDEPENDENT_AMBULATORY_CARE_PROVIDER_SITE_OTHER): Payer: Medicare HMO | Admitting: Podiatry

## 2015-02-16 VITALS — BP 131/66 | HR 66 | Resp 16

## 2015-02-16 DIAGNOSIS — M8430XA Stress fracture, unspecified site, initial encounter for fracture: Secondary | ICD-10-CM | POA: Diagnosis not present

## 2015-02-16 DIAGNOSIS — M722 Plantar fascial fibromatosis: Secondary | ICD-10-CM | POA: Diagnosis not present

## 2015-02-16 DIAGNOSIS — L84 Corns and callosities: Secondary | ICD-10-CM

## 2015-02-16 DIAGNOSIS — M79673 Pain in unspecified foot: Secondary | ICD-10-CM

## 2015-02-16 MED ORDER — TRIAMCINOLONE ACETONIDE 10 MG/ML IJ SUSP
10.0000 mg | Freq: Once | INTRAMUSCULAR | Status: AC
  Administered 2015-02-16: 10 mg

## 2015-02-16 NOTE — Patient Instructions (Signed)

## 2015-02-16 NOTE — Progress Notes (Signed)
   Subjective:    Patient ID: William [name], male    DOB: [birth year], 74 y.o.   MRN: [id number]  HPI  Pt presents with right foot pain in heel and dorsal forefoot lasting 1 month, worse in the mornings. He also has a painful callus on the left foot sub 5th met  Review of Systems  All other systems reviewed and are negative.      Objective:   Physical Exam        Assessment & Plan:

## 2015-02-17 NOTE — Progress Notes (Signed)
Subjective:     Patient ID: William [name], male   DOB: 03-26-1940, 74 y.o.   MRN: [id number]  HPI patient presents stating I'm having a lot of pain in my right heel for a couple months and over the last couple weeks I have developed exquisite tenderness in the top of my right foot with swelling and also I have lesions on my fifth metatarsal of both feet.   Review of Systems  All other systems reviewed and are negative.      Objective:   Physical Exam  Constitutional: He is oriented to person, place, and time.  Cardiovascular: Intact distal pulses.   Musculoskeletal: Normal range of motion.  Neurological: He is oriented to person, place, and time.  Skin: Skin is warm and dry.  Nursing note and vitals reviewed.  neurovascular status found to be intact muscle strength adequate range of motion within normal limits with patient noted to have exquisite discomfort of the plantar heel right at the insertional point tendon into the calcaneus with significant edema in the forefoot right centered around the third metatarsal neck. Also found have keratotic lesion fifth metatarsal left over right negative Homan sign was noted and good digital perfusion was noted     Assessment:      acute plantar fasciitis right with probable change in gait and strong possibility for stress fracture right along with keratotic lesion formation    Plan:      H&P and x-rays reviewed with patient. Today I injected the plantar fascia right 3 mg Kenalog 5 mill grams Xylocaine reviewed x-rays and applied air fracture walker due to fracture of the neck of the third metatarsal right. Debrided lesions and reappoint 3 weeks and dispensed Ace wrap for compression and instructed on the fact this will probably take 8-12 weeks to heal completely

## 2015-03-16 ENCOUNTER — Encounter: Payer: Self-pay | Admitting: Podiatry

## 2015-03-16 ENCOUNTER — Ambulatory Visit (INDEPENDENT_AMBULATORY_CARE_PROVIDER_SITE_OTHER): Payer: Medicare HMO | Admitting: Podiatry

## 2015-03-16 ENCOUNTER — Ambulatory Visit (INDEPENDENT_AMBULATORY_CARE_PROVIDER_SITE_OTHER): Payer: Medicare HMO

## 2015-03-16 DIAGNOSIS — M8430XA Stress fracture, unspecified site, initial encounter for fracture: Secondary | ICD-10-CM

## 2015-03-16 DIAGNOSIS — M79673 Pain in unspecified foot: Secondary | ICD-10-CM

## 2015-03-18 NOTE — Progress Notes (Signed)
Subjective:     Patient ID: William [name], male   DOB: [birth year], 75 y.o.   MRN: [id number]  HPI patient states I'm doing quite a bit better still have swelling and pain but it is improved markedly   Review of Systems     Objective:   Physical Exam Neurovascular status intact with improvement second metatarsal shaft with pain still present upon deep palpation and mild plantar heel pain    Assessment:     Stress fracture right doing well with healing occurring and plantar fasciitis right    Plan:     X-ray taken and advised on ice therapy and continued boot usage with reduced usage over the next several weeks

## 2015-07-27 DIAGNOSIS — K591 Functional diarrhea: Secondary | ICD-10-CM | POA: Insufficient documentation

## 2015-08-08 DIAGNOSIS — I340 Nonrheumatic mitral (valve) insufficiency: Secondary | ICD-10-CM | POA: Insufficient documentation

## 2015-10-26 DIAGNOSIS — L989 Disorder of the skin and subcutaneous tissue, unspecified: Secondary | ICD-10-CM | POA: Insufficient documentation

## 2016-06-25 ENCOUNTER — Ambulatory Visit (INDEPENDENT_AMBULATORY_CARE_PROVIDER_SITE_OTHER): Payer: Medicare HMO | Admitting: Orthopaedic Surgery

## 2016-06-25 ENCOUNTER — Ambulatory Visit (INDEPENDENT_AMBULATORY_CARE_PROVIDER_SITE_OTHER): Payer: Medicare HMO

## 2016-06-25 ENCOUNTER — Encounter (INDEPENDENT_AMBULATORY_CARE_PROVIDER_SITE_OTHER): Payer: Self-pay | Admitting: Orthopaedic Surgery

## 2016-06-25 VITALS — BP 153/72 | HR 54 | Resp 12 | Ht 72.0 in | Wt 185.0 lb

## 2016-06-25 DIAGNOSIS — M25562 Pain in left knee: Secondary | ICD-10-CM

## 2016-06-25 MED ORDER — BUPIVACAINE HCL 0.5 % IJ SOLN
3.0000 mL | INTRAMUSCULAR | Status: AC | PRN
  Administered 2016-06-25: 3 mL via INTRA_ARTICULAR

## 2016-06-25 MED ORDER — LIDOCAINE HCL 1 % IJ SOLN
5.0000 mL | INTRAMUSCULAR | Status: AC | PRN
  Administered 2016-06-25: 5 mL

## 2016-06-25 MED ORDER — METHYLPREDNISOLONE ACETATE 40 MG/ML IJ SUSP
80.0000 mg | INTRAMUSCULAR | Status: AC | PRN
  Administered 2016-06-25: 80 mg

## 2016-06-25 NOTE — Progress Notes (Signed)
Office Visit Note   Patient: William [name]           Date of Birth: 12-Mar-1940           MRN: [id number] Visit Date: 06/25/2016              Requested by: Loraine Leriche, MD [place], [place] [address] PCP: Pcp Not In System   Assessment & Plan: Visit Diagnoses:  1. Left knee pain, unspecified chronicity   Osteoarthritis left knee  Plan: Long discussion regarding the arthritis in different treatment options. We'll try a cortisone injection today. Have discussed follow-up cortisone injections and even Visco supplementation and supplement with NSAIDs  Follow-Up Instructions: Return if symptoms worsen or fail to improve.   Orders:  Orders Placed This Encounter  Procedures  . XR KNEE 3 VIEW LEFT   No orders of the defined types were placed in this encounter.     Procedures: Large Joint Inj Date/Time: 06/25/2016 11:33 AM Performed by: Garald Balding Authorized by: Garald Balding   Consent Given by:  Patient Timeout: prior to procedure the correct patient, procedure, and site was verified   Indications:  Pain and joint swelling Location:  Knee Site:  L knee Prep: patient was prepped and draped in usual sterile fashion   Needle Size:  25 G Needle Length:  1.5 inches Approach:  Anteromedial Ultrasound Guidance: No   Fluoroscopic Guidance: No   Arthrogram: No   Medications:  5 mL lidocaine 1 %; 80 mg methylPREDNISolone acetate 40 MG/ML; 3 mL bupivacaine 0.5 % Aspiration Attempted: No   Patient tolerance:  Patient tolerated the procedure well with no immediate complications     Clinical Data: No additional findings.   Subjective: Chief Complaint  Patient presents with  . Left Knee - Pain    William [name] is a 76 y o that presents with L knee pain x 1 month. 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 He experienced insidious onset of pain approximately 1 month ago.  Pain is localized more along the anterior than either the medial lateral compartments. He feels like he's had slow but is "swelling". He has tried some over-the-counter medicines that seemed to help "a little bit HPI  Review of Systems   Objective: Vital Signs: BP (!) 153/72   Pulse (!) 54   Resp 12   Ht 6' (1.829 m)   Wt 185 lb (83.9 kg)   BMI 25.09 kg/m   Physical Exam  Ortho Exam left knee exam demonstrates positive patellar crepitation. Some pain with patella motion. Small palpable plica that's not symptomatic. Minimal medial joint pain. Mild increased varus. No pain laterally. Lacks just a few degrees to full extension and flexes over 105. No popliteal pain. No calf pain or swelling. +1 pulses distally. Foot was warm.  Specialty Comments:  No specialty comments available.  Imaging: Xr Knee 3 View Left  Result Date: 06/25/2016 Films of the left knee retained 3 projections standing. There is decrease in the medial joint space compared to the lateral joint space. 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 Small osteophytes are identified in the medial compartment on both femur and tibia to a lesser extent degenerative changes are identified the patellofemoral joint and lateral compartment    PMFS History: Patient Active Problem List   Diagnosis Date Noted  . Aortic insufficiency   .  Hypertension   . Renal insufficiency   . Hyperlipidemia    Past Medical History:  Diagnosis Date  . Aortic insufficiency   . Hyperlipidemia   . Hypertension   . Renal insufficiency    WITH PROTEINURIA, creatnine 2.1    Family History  Problem Relation Age of Onset  . Cancer Mother   . Cirrhosis Father   . Diabetes Brother     Past Surgical History:  Procedure Laterality Date  . APPENDECTOMY    . CARDIOVASCULAR STRESS TEST  06/08/2009   EF 43%  . CHOLECYSTECTOMY    . FOOT SURGERY    . HERNIA REPAIR    . US ECHOCARDIOGRAPHY  05/24/2009     EF 60%   Social History   Occupational History  . general contractor Retired   Social History Main Topics  . Smoking status: Former Smoker    Quit date: 03/05/1966  . Smokeless tobacco: Never Used  . Alcohol use No  . Drug use: No  . Sexual activity: Not on file     Garald Balding, MD   Note - This record has been created using [employer].  Chart creation errors have been sought, but may not always  have been located. Such creation errors do not reflect on  the standard of medical care.

## 2016-07-13 DIAGNOSIS — D649 Anemia, unspecified: Secondary | ICD-10-CM | POA: Insufficient documentation

## 2016-07-13 DIAGNOSIS — E038 Other specified hypothyroidism: Secondary | ICD-10-CM | POA: Insufficient documentation

## 2016-07-13 DIAGNOSIS — E039 Hypothyroidism, unspecified: Secondary | ICD-10-CM | POA: Insufficient documentation

## 2016-10-02 DIAGNOSIS — N529 Male erectile dysfunction, unspecified: Secondary | ICD-10-CM | POA: Insufficient documentation

## 2017-03-06 IMAGING — CT CT ABD-PELV W/O CM
1 of 2 series · 15 of 32 positions shown, 19 images · non-contrast
Comparison: Chest radiographs obtained earlier today.

ADDENDUM:
Small parapelvic cysts are noted in both kidneys. There is also
small exophytic cyst arising from the lower pole of the left kidney.
Otherwise, the kidneys have normal appearances.
CLINICAL DATA: Sudden onset severe epigastric abdominal pain and
nausea. Cough with some shortness of breath for the past 3 weeks.
Increased density at the right lung base on chest radiographs
earlier today.

EXAM:
CT ABDOMEN AND PELVIS WITHOUT CONTRAST
TECHNIQUE: Multidetector CT imaging of the abdomen and pelvis was performed
following the standard protocol without IV contrast.

[Series 2: abd/pelvis 5.0 b31f · axial · 0.79mm/px · z∈[-498,-63]mm · 15 of 95 slices shown, 19 images]
[im 4/95  soft-tissue]
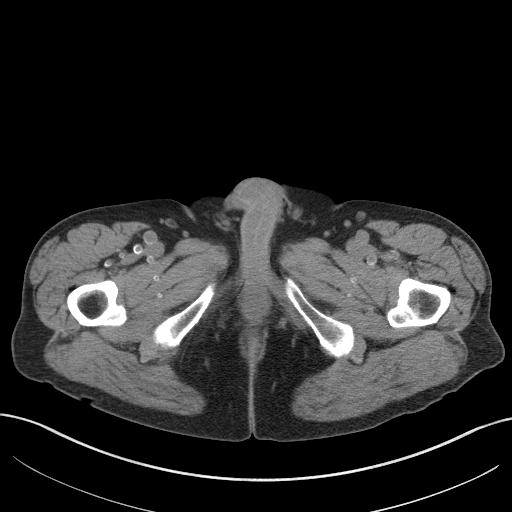
[im 4/95  bone]
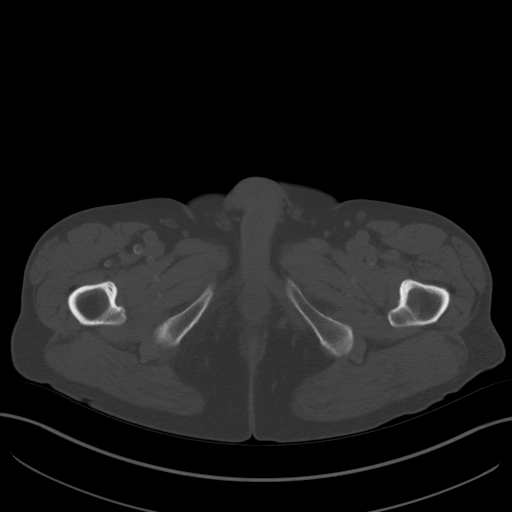
[im 12/95  soft-tissue]
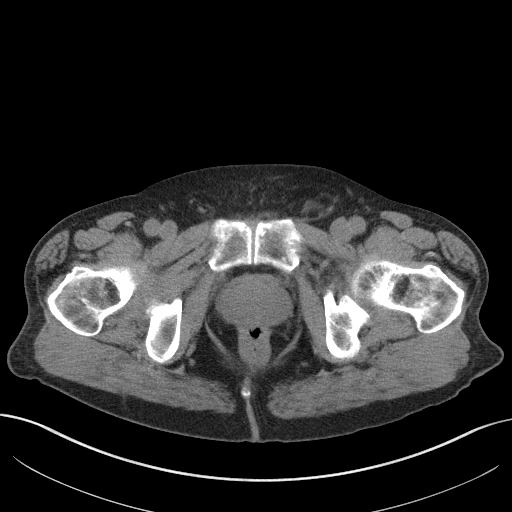
[im 20/95  soft-tissue]
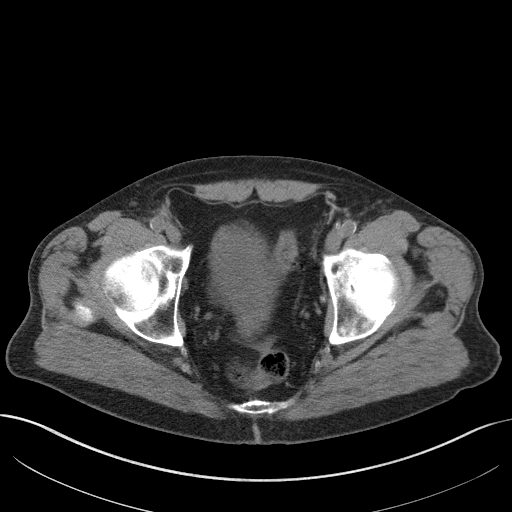
[im 28/95  soft-tissue]
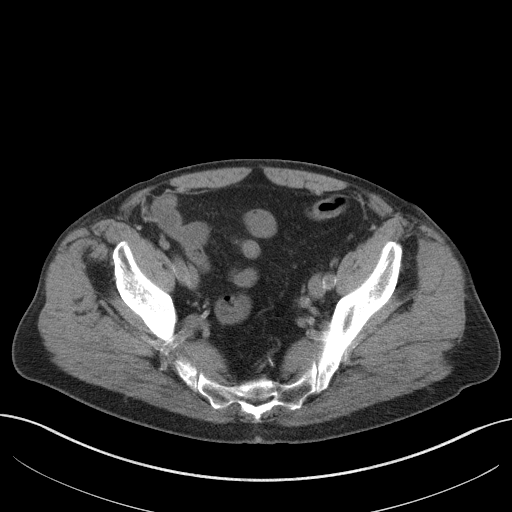
[im 32/95  soft-tissue]
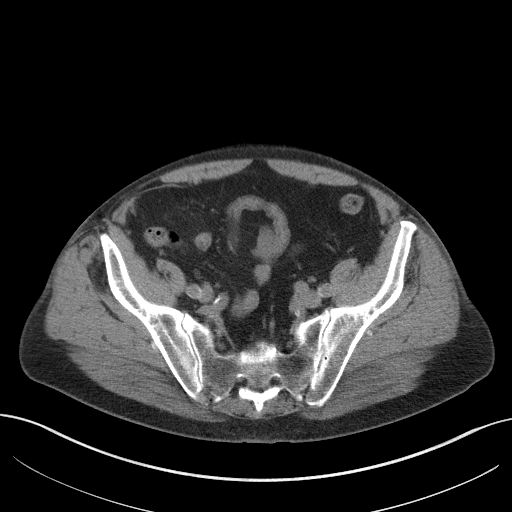
[im 40/95  soft-tissue]
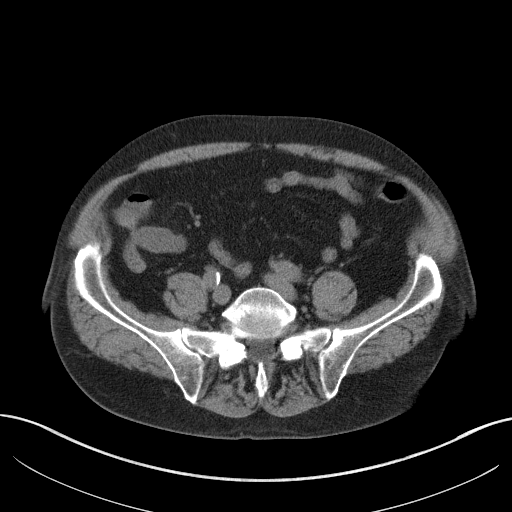
[im 48/95  soft-tissue]
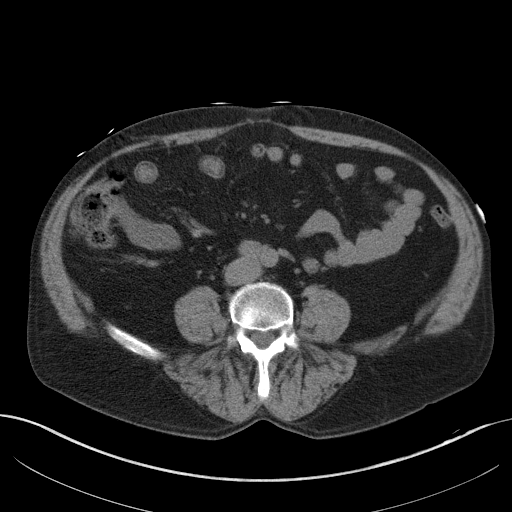
[im 55/95  soft-tissue]
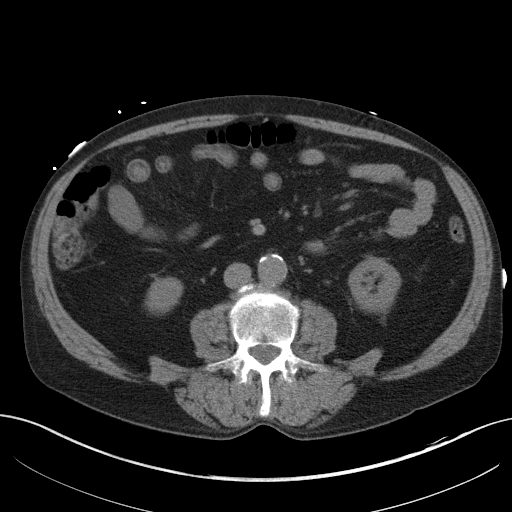
[im 63/95  soft-tissue]
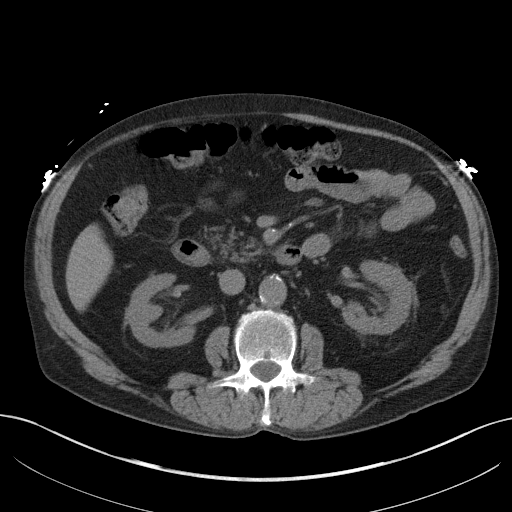
[im 63/95  bone]
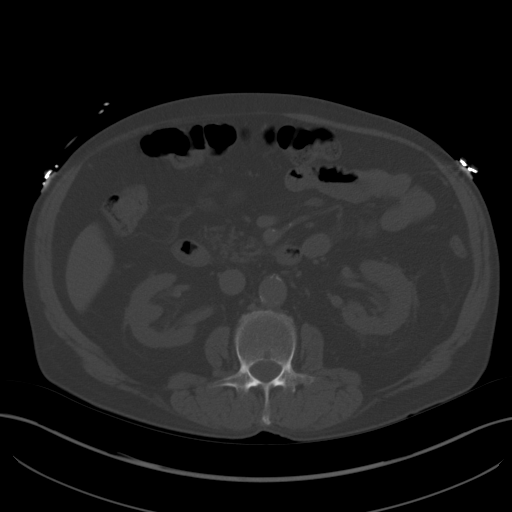
[im 67/95  soft-tissue]
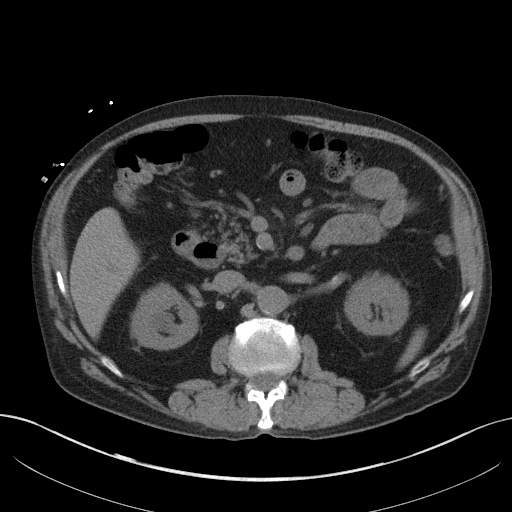
[im 75/95  soft-tissue]
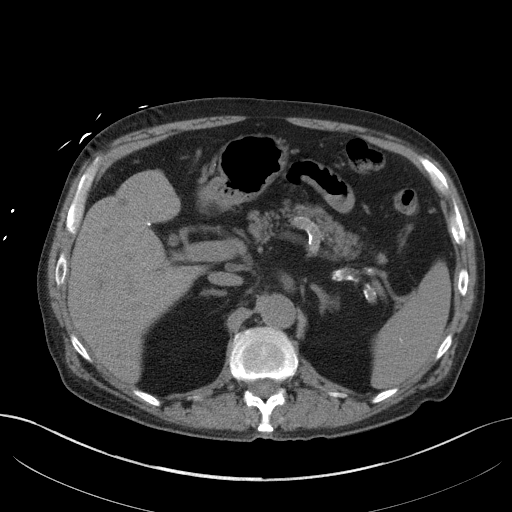
[im 79/95  lung]
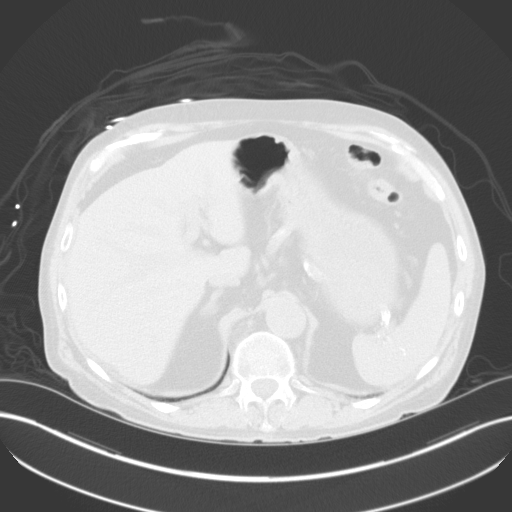
[im 83/95  soft-tissue]
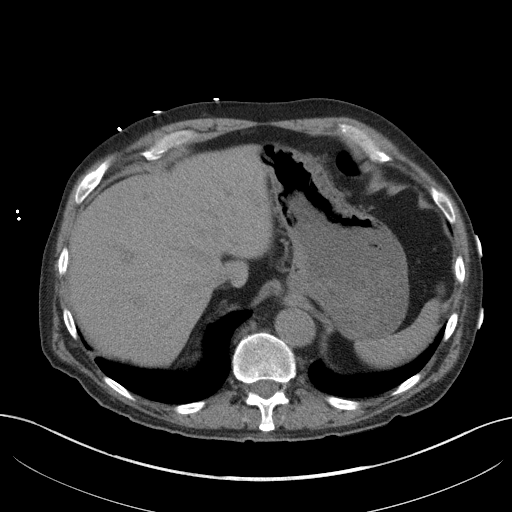
[im 83/95  lung]
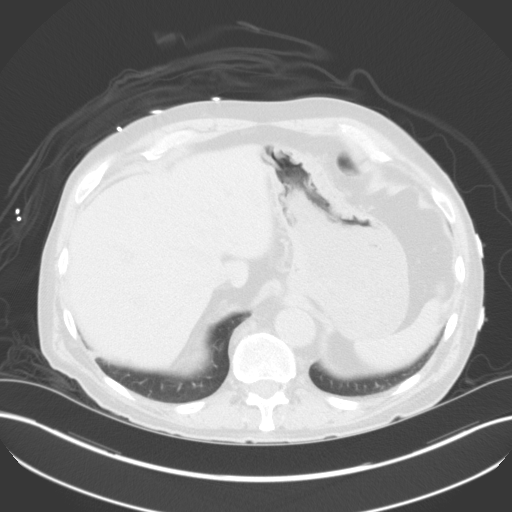
[im 87/95  lung]
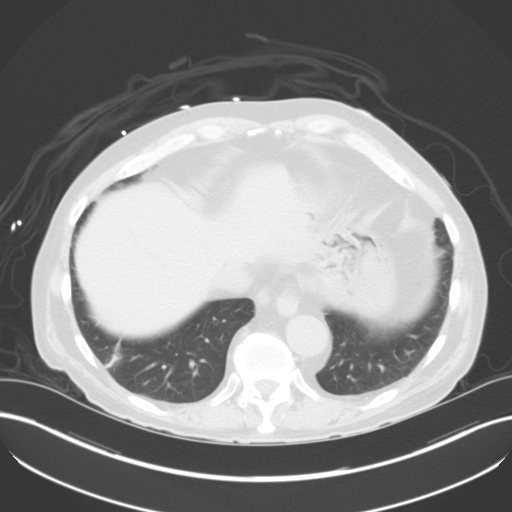
[im 91/95  soft-tissue]
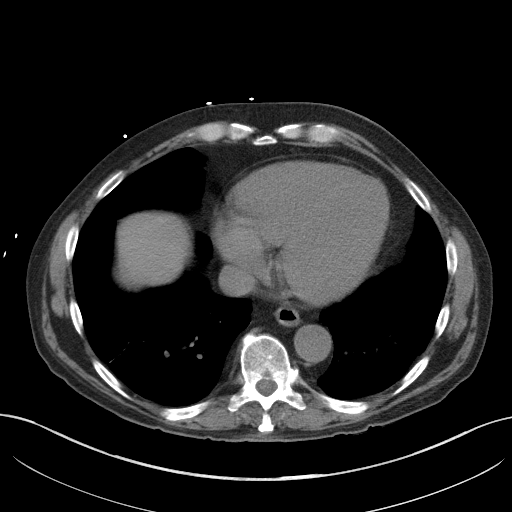
[im 91/95  lung]
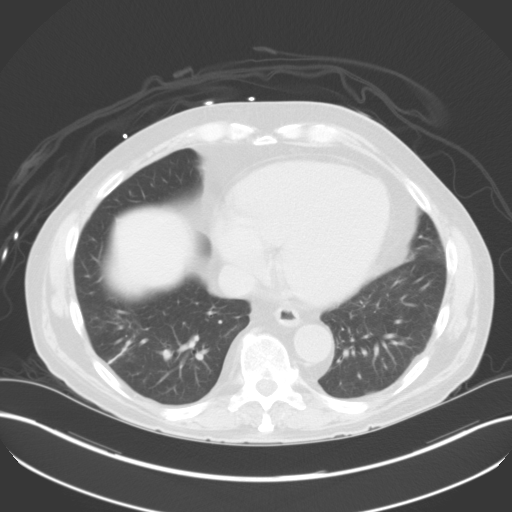

[15 of 32 positions shown; findings below may reference images not displayed]

FINDINGS: Small amount of patchy and linear density at the right lung base,
not included in its entirety. Cholecystectomy clips.

Calcified splenic granulomata. Unremarkable non contrasted
appearance of the liver, pancreas, adrenal glands, ureters and
urinary bladder.

Moderately enlarged prostate gland. Multiple sigmoid colon
diverticula. No gastric or small bowel abnormalities.

Atheromatous arterial calcifications. Lumbar and lower thoracic
spine degenerative changes.
IMPRESSION: 1. Small amount of atelectasis and probable pneumonia at the right
lung base.
2. No acute abdomen or pelvic abnormality.
3. Sigmoid diverticulosis.
4. Moderately enlarged prostate gland.

## 2017-04-04 DIAGNOSIS — R809 Proteinuria, unspecified: Secondary | ICD-10-CM | POA: Insufficient documentation

## 2018-02-03 DIAGNOSIS — R7301 Impaired fasting glucose: Secondary | ICD-10-CM | POA: Insufficient documentation

## 2018-02-19 ENCOUNTER — Encounter: Payer: Self-pay | Admitting: Podiatry

## 2018-02-19 ENCOUNTER — Ambulatory Visit (INDEPENDENT_AMBULATORY_CARE_PROVIDER_SITE_OTHER): Payer: Medicare HMO

## 2018-02-19 ENCOUNTER — Ambulatory Visit: Payer: Medicare HMO | Admitting: Podiatry

## 2018-02-19 DIAGNOSIS — S9031XA Contusion of right foot, initial encounter: Secondary | ICD-10-CM

## 2018-02-19 DIAGNOSIS — I059 Rheumatic mitral valve disease, unspecified: Secondary | ICD-10-CM | POA: Insufficient documentation

## 2018-02-19 NOTE — Progress Notes (Signed)
Subjective:   Patient ID: William [name], male   DOB: 77 y.o.   MRN: [id number]   HPI Patient presents stating that he traumatized his right foot a week ago and its been swollen and he is worried that he broke something   ROS      Objective:  Physical Exam  Neurovascular status intact muscle strength is adequate patient's right lateral forefoot showing swelling and pain upon palpation     Assessment:  Trauma of the right forefoot with possibility for fracture with history of stress fracture     Plan:  H&P x-ray reviewed and at this point I have recommended continued compression ice therapy and wearing a rigid bottom shoes.  Patient will be seen back if symptoms were to recur  X-ray indicates stress fracture third metatarsal but no indications of acute fracture

## 2018-08-18 DIAGNOSIS — I77810 Thoracic aortic ectasia: Secondary | ICD-10-CM | POA: Insufficient documentation

## 2019-02-05 DIAGNOSIS — K118 Other diseases of salivary glands: Secondary | ICD-10-CM | POA: Insufficient documentation

## 2019-02-05 DIAGNOSIS — E041 Nontoxic single thyroid nodule: Secondary | ICD-10-CM | POA: Insufficient documentation

## 2019-02-25 ENCOUNTER — Other Ambulatory Visit: Payer: Self-pay

## 2019-02-25 ENCOUNTER — Emergency Department (HOSPITAL_BASED_OUTPATIENT_CLINIC_OR_DEPARTMENT_OTHER)
Admission: EM | Admit: 2019-02-25 | Discharge: 2019-02-25 | Disposition: A | Discharge status: Home / Self Care | Payer: Medicare HMO | Attending: Emergency Medicine | Admitting: Emergency Medicine

## 2019-02-25 ENCOUNTER — Emergency Department (HOSPITAL_BASED_OUTPATIENT_CLINIC_OR_DEPARTMENT_OTHER): Payer: Medicare HMO

## 2019-02-25 ENCOUNTER — Encounter (HOSPITAL_BASED_OUTPATIENT_CLINIC_OR_DEPARTMENT_OTHER): Payer: Self-pay

## 2019-02-25 DIAGNOSIS — I129 Hypertensive chronic kidney disease with stage 1 through stage 4 chronic kidney disease, or unspecified chronic kidney disease: Secondary | ICD-10-CM | POA: Diagnosis not present

## 2019-02-25 DIAGNOSIS — M79671 Pain in right foot: Secondary | ICD-10-CM | POA: Diagnosis not present

## 2019-02-25 DIAGNOSIS — N184 Chronic kidney disease, stage 4 (severe): Secondary | ICD-10-CM | POA: Insufficient documentation

## 2019-02-25 DIAGNOSIS — Z79899 Other long term (current) drug therapy: Secondary | ICD-10-CM | POA: Diagnosis not present

## 2019-02-25 DIAGNOSIS — Z87891 Personal history of nicotine dependence: Secondary | ICD-10-CM | POA: Diagnosis not present

## 2019-02-25 HISTORY — DX: Dependence on renal dialysis: Z99.2

## 2019-02-25 HISTORY — DX: Unspecified kidney failure: N19

## 2019-02-25 MED ORDER — ACETAMINOPHEN 325 MG PO TABS
650.0000 mg | ORAL_TABLET | Freq: Once | ORAL | Status: AC
Start: 2019-02-25 — End: 2019-02-25
  Administered 2019-02-25: 650 mg via ORAL
  Filled 2019-02-25: qty 2

## 2019-02-25 NOTE — ED Notes (Signed)
ED Provider at bedside. 

## 2019-02-25 NOTE — Discharge Instructions (Addendum)
Ice affected area.  Take Tylenol or Motrin as needed for pain.  Please keep your follow-up appointment with your podiatrist next week.  Return to the ED immediately for new or worsening symptoms or concerns, such as weakness, numbness, tingling, new or worsening pain or any concerns at all.

## 2019-02-25 NOTE — ED Triage Notes (Signed)
Pt c/o right foot pain x 3 weeks-denies injury-pain worse x today-to triage in w/c

## 2019-02-25 NOTE — ED Provider Notes (Signed)
[name] Provider Note   CSN: [id number] Arrival date & time: 02/25/19  1608     History Chief Complaint  Patient presents with  . Foot Pain    [name] is a 78 y.o. male.  HPI   78 year old male presents with right foot pain for 3 weeks.  Patient states he has been working through the pain.  However today while he was at work when he was putting pressure on his right foot he noted significant pain.  He states a pinpoint area of pain to the plantar aspect of the right foot, approximately at the fifth metatarsal-tarsal joint.  He denies any numbness, tingling.  He denies any decreased range of motion or swelling.  Past Medical History:  Diagnosis Date  . Aortic insufficiency   . Hemodialysis patient ([facility])   . Hyperlipidemia   . Hypertension   . Renal failure   . Renal insufficiency    WITH PROTEINURIA, creatnine 2.1    Patient Active Problem List   Diagnosis Date Noted  . Mitral valve disorder 02/19/2018  . Impaired fasting glucose 02/03/2018  . Proteinuria 04/04/2017  . Impotence 10/02/2016  . Normocytic anemia 07/13/2016  . Subclinical hypothyroidism 07/13/2016  . Lesion of neck 10/26/2015  . Non-rheumatic mitral regurgitation 08/08/2015  . Functional diarrhea 07/27/2015  . Gastroesophageal reflux disease 12/30/2014  . Leg pain, right 07/21/2014  . CKD (chronic kidney disease) stage 4, GFR 15-29 ml/min (HCC) 07/01/2014  . Osteoporosis 10/26/2013  . Benign non-nodular prostatic hyperplasia with lower urinary tract symptoms 10/15/2013  . Elevated prostate specific antigen (PSA) 10/15/2013  . Aortic insufficiency   . Hypertension   . Renal insufficiency   . Hyperlipidemia     Past Surgical History:  Procedure Laterality Date  . APPENDECTOMY    . CARDIOVASCULAR STRESS TEST  06/08/2009   EF 43%  . CHOLECYSTECTOMY    . FOOT SURGERY    . HERNIA REPAIR    . US ECHOCARDIOGRAPHY  05/24/2009   EF 60%       Family History    Problem Relation Age of Onset  . Cancer Mother   . Cirrhosis Father   . Diabetes Brother     Social History   Tobacco Use  . Smoking status: Former Smoker    Quit date: 03/05/1966    Years since quitting: 53.0  . Smokeless tobacco: Never Used  Substance Use Topics  . Alcohol use: No  . Drug use: No    Home Medications Prior to Admission medications   Medication Sig Start Date End Date Taking? Authorizing Provider  amLODipine (NORVASC) 10 MG tablet TAKE ONE TABLET BY MOUTH ONCE DAILY 08/08/15  Yes [provider]  losartan (COZAAR) 50 MG tablet Take 50 mg by mouth daily. Reported on 02/16/2015   Yes [provider]  omeprazole (PRILOSEC) 20 MG capsule Take 20 mg by mouth daily. Reported on 02/16/2015   Yes [provider]  torsemide (DEMADEX) 20 MG tablet Take by mouth. 07/18/17  Yes [provider]  sodium bicarbonate 1 mEq/mL SOLN Take by mouth.    [provider]    Allergies    Nsaids and Lisinopril  Review of Systems   Review of Systems  Constitutional: Negative for chills and fever.  Respiratory: Negative for shortness of breath.   Cardiovascular: Negative for chest pain.  Gastrointestinal: Negative for abdominal pain, nausea and vomiting.  Musculoskeletal: Positive for arthralgias. Negative for joint swelling.    Physical Exam  Updated Vital Signs BP (!) 142/70 (BP Location: Left Arm)   Pulse 65   Temp 98.3 F (36.8 C) (Oral)   Resp 20   Ht 6' (1.829 m)   Wt 83.9 kg   SpO2 100%   BMI 25.09 kg/m   Physical Exam Vitals and nursing note reviewed.  Constitutional:      Appearance: He is well-developed.  HENT:     Head: Normocephalic and atraumatic.  Eyes:     Conjunctiva/sclera: Conjunctivae normal.  Cardiovascular:     Pulses:          Dorsalis pedis pulses are 3+ on the right side.  Pulmonary:     Effort: Pulmonary effort is normal.  Musculoskeletal:       Feet:  Feet:     Right foot:     Toenail  Condition: Right toenails are normal.  Skin:    Findings: No erythema or rash.  Neurological:     Mental Status: He is alert and oriented to person, place, and time.     ED Results / Procedures / Treatments   Labs (all labs ordered are listed, but only abnormal results are displayed) Labs Reviewed - No data to display  EKG None  Radiology DG Foot Complete Right  Result Date: 02/25/2019 CLINICAL DATA:  78 year old male with right foot pain. EXAM: RIGHT FOOT COMPLETE - 3+ VIEW COMPARISON:  Right foot radiograph dated 02/19/2018. FINDINGS: There is no acute fracture or dislocation. Old healed fracture of the third metatarsal. The bones are osteopenic. Vascular calcifications noted. The soft tissues are unremarkable. IMPRESSION: No acute fracture or dislocation. Electronically Signed   By: Anner Crete M.D.   On: 02/25/2019 17:11    Procedures Procedures (including critical care time)  Medications Ordered in ED Medications  acetaminophen (TYLENOL) tablet 650 mg (650 mg Oral Given 02/25/19 1633)    ED Course  I have reviewed the triage vital signs and the nursing notes.  Pertinent labs & imaging results that were available during my care of the patient were reviewed by me and considered in my medical decision making (see chart for details).    MDM Rules/Calculators/A&P                      Presents with right foot pain on the fifth metatarsal/tarsal joint.  There is no erythema, no crepitus, no palpable deformity.  There is no evidence of cellulitis or gout on physical exam.  Patient has full active range of motion of left foot and ankle and is neurovascularly intact distally.  X-ray is unremarkable.  I discussed with patient and encourage close follow-up with podiatry.  Patient states he already has an appointment with podiatrist next week.  Encouraged ice, Tylenol and Motrin.  Pain could be secondary to arthritis.  Patient agreeable with plan.  Given return precautions.  He  is ready and stable for discharge.   At this time there does not appear to be any evidence of an acute emergency medical condition and the patient appears stable for discharge with appropriate outpatient follow up.Diagnosis was discussed with patient who verbalizes understanding and is agreeable to discharge. Pt case discussed with Dr. Ronnald Nian who agrees with my plan.     Final Clinical Impression(s) / ED Diagnoses Final diagnoses:  Foot pain, right    Rx / DC Orders ED Discharge Orders    None       Rachel Moulds 02/25/19 1806    Lennice Sites,  DO 02/25/19 1951

## 2019-02-26 DIAGNOSIS — M7671 Peroneal tendinitis, right leg: Secondary | ICD-10-CM | POA: Insufficient documentation

## 2019-03-05 ENCOUNTER — Ambulatory Visit: Payer: Medicare HMO | Admitting: Podiatry

## 2019-03-05 DIAGNOSIS — H903 Sensorineural hearing loss, bilateral: Secondary | ICD-10-CM | POA: Insufficient documentation

## 2019-03-05 DIAGNOSIS — H6123 Impacted cerumen, bilateral: Secondary | ICD-10-CM | POA: Insufficient documentation

## 2019-03-06 DIAGNOSIS — D509 Iron deficiency anemia, unspecified: Secondary | ICD-10-CM | POA: Diagnosis not present

## 2019-03-06 DIAGNOSIS — N2581 Secondary hyperparathyroidism of renal origin: Secondary | ICD-10-CM | POA: Diagnosis not present

## 2019-03-06 DIAGNOSIS — D631 Anemia in chronic kidney disease: Secondary | ICD-10-CM | POA: Diagnosis not present

## 2019-03-06 DIAGNOSIS — Z23 Encounter for immunization: Secondary | ICD-10-CM | POA: Diagnosis not present

## 2019-03-06 DIAGNOSIS — N186 End stage renal disease: Secondary | ICD-10-CM | POA: Diagnosis not present

## 2019-03-06 DIAGNOSIS — I10 Essential (primary) hypertension: Secondary | ICD-10-CM | POA: Diagnosis not present

## 2019-04-06 DIAGNOSIS — Z4932 Encounter for adequacy testing for peritoneal dialysis: Secondary | ICD-10-CM | POA: Diagnosis not present

## 2019-04-06 DIAGNOSIS — N2581 Secondary hyperparathyroidism of renal origin: Secondary | ICD-10-CM | POA: Diagnosis not present

## 2019-04-06 DIAGNOSIS — D509 Iron deficiency anemia, unspecified: Secondary | ICD-10-CM | POA: Diagnosis not present

## 2019-04-06 DIAGNOSIS — I10 Essential (primary) hypertension: Secondary | ICD-10-CM | POA: Diagnosis not present

## 2019-04-06 DIAGNOSIS — N186 End stage renal disease: Secondary | ICD-10-CM | POA: Diagnosis not present

## 2019-04-06 DIAGNOSIS — D631 Anemia in chronic kidney disease: Secondary | ICD-10-CM | POA: Diagnosis not present

## 2019-04-07 DIAGNOSIS — H43812 Vitreous degeneration, left eye: Secondary | ICD-10-CM | POA: Diagnosis not present

## 2019-05-04 DIAGNOSIS — I10 Essential (primary) hypertension: Secondary | ICD-10-CM | POA: Diagnosis not present

## 2019-05-04 DIAGNOSIS — N2581 Secondary hyperparathyroidism of renal origin: Secondary | ICD-10-CM | POA: Diagnosis not present

## 2019-05-04 DIAGNOSIS — N186 End stage renal disease: Secondary | ICD-10-CM | POA: Diagnosis not present

## 2019-05-04 DIAGNOSIS — D509 Iron deficiency anemia, unspecified: Secondary | ICD-10-CM | POA: Diagnosis not present

## 2019-05-04 DIAGNOSIS — D631 Anemia in chronic kidney disease: Secondary | ICD-10-CM | POA: Diagnosis not present

## 2019-05-05 DIAGNOSIS — Z1159 Encounter for screening for other viral diseases: Secondary | ICD-10-CM | POA: Diagnosis not present

## 2019-05-05 DIAGNOSIS — Z114 Encounter for screening for human immunodeficiency virus [HIV]: Secondary | ICD-10-CM | POA: Diagnosis not present

## 2019-05-07 DIAGNOSIS — R600 Localized edema: Secondary | ICD-10-CM | POA: Diagnosis not present

## 2019-05-11 ENCOUNTER — Other Ambulatory Visit: Payer: Self-pay

## 2019-05-11 ENCOUNTER — Encounter: Payer: Self-pay | Admitting: Podiatry

## 2019-05-11 ENCOUNTER — Ambulatory Visit: Payer: PPO | Admitting: Podiatry

## 2019-05-11 VITALS — Temp 97.6°F

## 2019-05-11 DIAGNOSIS — M779 Enthesopathy, unspecified: Secondary | ICD-10-CM

## 2019-05-11 DIAGNOSIS — B351 Tinea unguium: Secondary | ICD-10-CM

## 2019-05-11 DIAGNOSIS — M79675 Pain in left toe(s): Secondary | ICD-10-CM | POA: Diagnosis not present

## 2019-05-11 DIAGNOSIS — M79674 Pain in right toe(s): Secondary | ICD-10-CM

## 2019-05-11 DIAGNOSIS — M778 Other enthesopathies, not elsewhere classified: Secondary | ICD-10-CM | POA: Diagnosis not present

## 2019-05-11 DIAGNOSIS — L84 Corns and callosities: Secondary | ICD-10-CM

## 2019-05-14 NOTE — Progress Notes (Signed)
Subjective:   Patient ID: [name], male   DOB: 79 y.o.   MRN: [id number]   HPI Patient presents stating is got a very painful lesion with fluid buildup around his fifth metatarsal left that is hard for him to walk on along with a separate lesion on his fourth toe.  Also states his nails are becoming increasingly sore incurvated and hard for him to cut both feet.  Patient does have good digital perfusion and he states he is not smoking   ROS      Objective:  Physical Exam  Neurovascular status intact muscle strength was found to be adequate range of motion within normal limits.  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  Patient tolerated well will be seen back to recheck

## 2019-06-04 DIAGNOSIS — N186 End stage renal disease: Secondary | ICD-10-CM | POA: Diagnosis not present

## 2019-06-04 DIAGNOSIS — D509 Iron deficiency anemia, unspecified: Secondary | ICD-10-CM | POA: Diagnosis not present

## 2019-06-04 DIAGNOSIS — I10 Essential (primary) hypertension: Secondary | ICD-10-CM | POA: Diagnosis not present

## 2019-06-04 DIAGNOSIS — N2581 Secondary hyperparathyroidism of renal origin: Secondary | ICD-10-CM | POA: Diagnosis not present

## 2019-06-04 DIAGNOSIS — D631 Anemia in chronic kidney disease: Secondary | ICD-10-CM | POA: Diagnosis not present

## 2019-07-04 DIAGNOSIS — N2581 Secondary hyperparathyroidism of renal origin: Secondary | ICD-10-CM | POA: Diagnosis not present

## 2019-07-04 DIAGNOSIS — N186 End stage renal disease: Secondary | ICD-10-CM | POA: Diagnosis not present

## 2019-07-04 DIAGNOSIS — D509 Iron deficiency anemia, unspecified: Secondary | ICD-10-CM | POA: Diagnosis not present

## 2019-07-04 DIAGNOSIS — I10 Essential (primary) hypertension: Secondary | ICD-10-CM | POA: Diagnosis not present

## 2019-07-04 DIAGNOSIS — D631 Anemia in chronic kidney disease: Secondary | ICD-10-CM | POA: Diagnosis not present

## 2019-07-06 ENCOUNTER — Ambulatory Visit: Payer: PPO | Admitting: Podiatry

## 2019-07-06 ENCOUNTER — Other Ambulatory Visit: Payer: Self-pay

## 2019-07-06 ENCOUNTER — Encounter: Payer: Self-pay | Admitting: Podiatry

## 2019-07-06 VITALS — Temp 97.7°F

## 2019-07-06 DIAGNOSIS — M2042 Other hammer toe(s) (acquired), left foot: Secondary | ICD-10-CM | POA: Diagnosis not present

## 2019-07-06 DIAGNOSIS — L84 Corns and callosities: Secondary | ICD-10-CM | POA: Diagnosis not present

## 2019-07-07 DIAGNOSIS — Z4932 Encounter for adequacy testing for peritoneal dialysis: Secondary | ICD-10-CM | POA: Diagnosis not present

## 2019-07-08 NOTE — Progress Notes (Signed)
Subjective:   Patient ID: [name], male   DOB: 79 y.o.   MRN: [id number]   HPI Patient presents stating this toe has started to bother me again and ultimately I know I might need surgery and I wanted to review   ROS      Objective:  Physical Exam  Neurovascular status intact with patient's left second digit moderately elongated with distal keratotic lesion noted on it that is painful with a period of relief that was short relatively last visit and also looking for some kind of pad therapy     Assessment:  Digital deformity second digit left with hammertoe and keratotic lesion formation that is painful when pressed secondary to the structure of the digit     Plan:  H&P reviewed condition recommended that at 1 point we will need to consider correction I discussed arthroplasty possible tenotomy but at this point I did do deep debridement of a sharp nature second digit applied padding discussed shoe gear modifications that could be tried along with activities.  Patient will be seen back as symptoms indicate and if it is a short period of time we will need to consider more aggressive which was all discussed with patient today

## 2019-07-22 DIAGNOSIS — N186 End stage renal disease: Secondary | ICD-10-CM | POA: Diagnosis not present

## 2019-07-22 DIAGNOSIS — I351 Nonrheumatic aortic (valve) insufficiency: Secondary | ICD-10-CM | POA: Diagnosis not present

## 2019-07-22 DIAGNOSIS — I120 Hypertensive chronic kidney disease with stage 5 chronic kidney disease or end stage renal disease: Secondary | ICD-10-CM | POA: Diagnosis not present

## 2019-07-22 DIAGNOSIS — I77810 Thoracic aortic ectasia: Secondary | ICD-10-CM | POA: Diagnosis not present

## 2019-07-22 DIAGNOSIS — I341 Nonrheumatic mitral (valve) prolapse: Secondary | ICD-10-CM | POA: Diagnosis not present

## 2019-07-22 DIAGNOSIS — E039 Hypothyroidism, unspecified: Secondary | ICD-10-CM | POA: Diagnosis not present

## 2019-07-22 DIAGNOSIS — B029 Zoster without complications: Secondary | ICD-10-CM | POA: Diagnosis not present

## 2019-07-22 DIAGNOSIS — Z992 Dependence on renal dialysis: Secondary | ICD-10-CM | POA: Diagnosis not present

## 2019-07-22 DIAGNOSIS — D631 Anemia in chronic kidney disease: Secondary | ICD-10-CM | POA: Diagnosis not present

## 2019-07-22 DIAGNOSIS — R21 Rash and other nonspecific skin eruption: Secondary | ICD-10-CM | POA: Diagnosis not present

## 2019-08-04 DIAGNOSIS — N186 End stage renal disease: Secondary | ICD-10-CM | POA: Diagnosis not present

## 2019-08-04 DIAGNOSIS — N2581 Secondary hyperparathyroidism of renal origin: Secondary | ICD-10-CM | POA: Diagnosis not present

## 2019-08-04 DIAGNOSIS — D509 Iron deficiency anemia, unspecified: Secondary | ICD-10-CM | POA: Diagnosis not present

## 2019-08-04 DIAGNOSIS — D631 Anemia in chronic kidney disease: Secondary | ICD-10-CM | POA: Diagnosis not present

## 2019-08-04 DIAGNOSIS — I10 Essential (primary) hypertension: Secondary | ICD-10-CM | POA: Diagnosis not present

## 2019-08-05 DIAGNOSIS — Z01818 Encounter for other preprocedural examination: Secondary | ICD-10-CM | POA: Diagnosis not present

## 2019-08-19 ENCOUNTER — Telehealth: Payer: Self-pay | Admitting: Gastroenterology

## 2019-08-19 NOTE — Telephone Encounter (Signed)
Received last colonoscopy report for patient, sending to Dr. Silverio Decamp for review per patient request. Patient states he is due for a colonoscopy.

## 2019-08-24 DIAGNOSIS — E785 Hyperlipidemia, unspecified: Secondary | ICD-10-CM | POA: Diagnosis not present

## 2019-08-24 DIAGNOSIS — Z992 Dependence on renal dialysis: Secondary | ICD-10-CM | POA: Diagnosis not present

## 2019-08-24 DIAGNOSIS — K219 Gastro-esophageal reflux disease without esophagitis: Secondary | ICD-10-CM | POA: Diagnosis not present

## 2019-08-24 DIAGNOSIS — Z01810 Encounter for preprocedural cardiovascular examination: Secondary | ICD-10-CM | POA: Diagnosis not present

## 2019-08-24 DIAGNOSIS — Z125 Encounter for screening for malignant neoplasm of prostate: Secondary | ICD-10-CM | POA: Diagnosis not present

## 2019-08-24 DIAGNOSIS — I120 Hypertensive chronic kidney disease with stage 5 chronic kidney disease or end stage renal disease: Secondary | ICD-10-CM | POA: Diagnosis not present

## 2019-08-24 DIAGNOSIS — N186 End stage renal disease: Secondary | ICD-10-CM | POA: Diagnosis not present

## 2019-08-24 DIAGNOSIS — H9190 Unspecified hearing loss, unspecified ear: Secondary | ICD-10-CM | POA: Diagnosis not present

## 2019-08-24 DIAGNOSIS — Z7289 Other problems related to lifestyle: Secondary | ICD-10-CM | POA: Diagnosis not present

## 2019-08-24 DIAGNOSIS — Z114 Encounter for screening for human immunodeficiency virus [HIV]: Secondary | ICD-10-CM | POA: Diagnosis not present

## 2019-08-24 DIAGNOSIS — Z9049 Acquired absence of other specified parts of digestive tract: Secondary | ICD-10-CM | POA: Diagnosis not present

## 2019-08-24 DIAGNOSIS — Z729 Problem related to lifestyle, unspecified: Secondary | ICD-10-CM | POA: Diagnosis not present

## 2019-08-24 DIAGNOSIS — R93429 Abnormal radiologic findings on diagnostic imaging of unspecified kidney: Secondary | ICD-10-CM | POA: Diagnosis not present

## 2019-08-24 DIAGNOSIS — Z01818 Encounter for other preprocedural examination: Secondary | ICD-10-CM | POA: Diagnosis not present

## 2019-08-25 NOTE — Telephone Encounter (Signed)
Ok to schedule for direct colonoscopy for colorectal ca screening. Last exam in 2013 at [facility], ?inadequate bowel prep.

## 2019-08-25 NOTE — Telephone Encounter (Signed)
Records are in your IN box Dr Silverio Decamp can you look at this

## 2019-08-25 NOTE — Telephone Encounter (Signed)
[name], could you please update [place] on the status of this records. Pt just called.

## 2019-09-03 DIAGNOSIS — N186 End stage renal disease: Secondary | ICD-10-CM | POA: Diagnosis not present

## 2019-09-03 DIAGNOSIS — I10 Essential (primary) hypertension: Secondary | ICD-10-CM | POA: Diagnosis not present

## 2019-09-03 DIAGNOSIS — N2581 Secondary hyperparathyroidism of renal origin: Secondary | ICD-10-CM | POA: Diagnosis not present

## 2019-09-03 DIAGNOSIS — D631 Anemia in chronic kidney disease: Secondary | ICD-10-CM | POA: Diagnosis not present

## 2019-09-10 DIAGNOSIS — Z1211 Encounter for screening for malignant neoplasm of colon: Secondary | ICD-10-CM | POA: Diagnosis not present

## 2019-09-10 DIAGNOSIS — K529 Noninfective gastroenteritis and colitis, unspecified: Secondary | ICD-10-CM | POA: Diagnosis not present

## 2019-09-11 DIAGNOSIS — K529 Noninfective gastroenteritis and colitis, unspecified: Secondary | ICD-10-CM | POA: Diagnosis not present

## 2019-10-04 DIAGNOSIS — D631 Anemia in chronic kidney disease: Secondary | ICD-10-CM | POA: Diagnosis not present

## 2019-10-04 DIAGNOSIS — N186 End stage renal disease: Secondary | ICD-10-CM | POA: Diagnosis not present

## 2019-10-04 DIAGNOSIS — I10 Essential (primary) hypertension: Secondary | ICD-10-CM | POA: Diagnosis not present

## 2019-10-04 DIAGNOSIS — N2581 Secondary hyperparathyroidism of renal origin: Secondary | ICD-10-CM | POA: Diagnosis not present

## 2019-10-06 DIAGNOSIS — Z4932 Encounter for adequacy testing for peritoneal dialysis: Secondary | ICD-10-CM | POA: Diagnosis not present

## 2019-10-30 DIAGNOSIS — D126 Benign neoplasm of colon, unspecified: Secondary | ICD-10-CM | POA: Diagnosis not present

## 2019-10-30 DIAGNOSIS — Z1211 Encounter for screening for malignant neoplasm of colon: Secondary | ICD-10-CM | POA: Diagnosis not present

## 2019-10-30 DIAGNOSIS — R197 Diarrhea, unspecified: Secondary | ICD-10-CM | POA: Diagnosis not present

## 2019-10-30 DIAGNOSIS — K529 Noninfective gastroenteritis and colitis, unspecified: Secondary | ICD-10-CM | POA: Diagnosis not present

## 2019-10-30 DIAGNOSIS — N186 End stage renal disease: Secondary | ICD-10-CM | POA: Diagnosis not present

## 2019-10-30 DIAGNOSIS — K635 Polyp of colon: Secondary | ICD-10-CM | POA: Diagnosis not present

## 2019-10-30 DIAGNOSIS — D122 Benign neoplasm of ascending colon: Secondary | ICD-10-CM | POA: Diagnosis not present

## 2019-10-30 DIAGNOSIS — K648 Other hemorrhoids: Secondary | ICD-10-CM | POA: Diagnosis not present

## 2019-11-04 DIAGNOSIS — D631 Anemia in chronic kidney disease: Secondary | ICD-10-CM | POA: Diagnosis not present

## 2019-11-04 DIAGNOSIS — N186 End stage renal disease: Secondary | ICD-10-CM | POA: Diagnosis not present

## 2019-11-04 DIAGNOSIS — N2581 Secondary hyperparathyroidism of renal origin: Secondary | ICD-10-CM | POA: Diagnosis not present

## 2019-11-04 DIAGNOSIS — I10 Essential (primary) hypertension: Secondary | ICD-10-CM | POA: Diagnosis not present

## 2019-11-04 DIAGNOSIS — D509 Iron deficiency anemia, unspecified: Secondary | ICD-10-CM | POA: Diagnosis not present

## 2019-11-05 ENCOUNTER — Ambulatory Visit: Payer: PPO | Admitting: Podiatry

## 2019-11-05 ENCOUNTER — Encounter: Payer: Self-pay | Admitting: Podiatry

## 2019-11-05 ENCOUNTER — Other Ambulatory Visit: Payer: Self-pay

## 2019-11-05 VITALS — Temp 97.2°F

## 2019-11-05 DIAGNOSIS — M722 Plantar fascial fibromatosis: Secondary | ICD-10-CM

## 2019-11-05 DIAGNOSIS — L84 Corns and callosities: Secondary | ICD-10-CM

## 2019-11-06 NOTE — Progress Notes (Signed)
Subjective:   Patient ID: [name], male   DOB: 79 y.o.   MRN: [id number]   HPI Patient presents stating that the right heel has started to get very sore again and makes it hard to walk and he has a lesion on the bottom of the left and right foot fifth metatarsal that gets sore when he tries to bear weight on them.  States that this is been getting gradually worse over the last month   ROS      Objective:  Physical Exam  Neurovascular status intact with inflammation fluid buildup of the plantar fascial right at the insertional point of the tendon into the calcaneus and I did note that there is keratotic lesion subfifth metatarsal bilateral     Assessment:  Plantar fasciitis right with inflammation fluid buildup along with keratotic lesion bilateral     Plan:  H&P conditions reviewed and I went ahead did sterile prep injected the plantar fascial right 3 mg Kenalog 5 mg Xylocaine and did sterile sharp debridement of lesions on both feet fifth metatarsal no iatrogenic bleeding noted

## 2019-11-30 DIAGNOSIS — D485 Neoplasm of uncertain behavior of skin: Secondary | ICD-10-CM | POA: Diagnosis not present

## 2019-11-30 DIAGNOSIS — Z86008 Personal history of in-situ neoplasm of other site: Secondary | ICD-10-CM | POA: Diagnosis not present

## 2019-11-30 DIAGNOSIS — L570 Actinic keratosis: Secondary | ICD-10-CM | POA: Diagnosis not present

## 2019-11-30 DIAGNOSIS — L820 Inflamed seborrheic keratosis: Secondary | ICD-10-CM | POA: Diagnosis not present

## 2019-12-02 DIAGNOSIS — Z005 Encounter for examination of potential donor of organ and tissue: Secondary | ICD-10-CM | POA: Diagnosis not present

## 2019-12-04 DIAGNOSIS — D509 Iron deficiency anemia, unspecified: Secondary | ICD-10-CM | POA: Diagnosis not present

## 2019-12-04 DIAGNOSIS — N186 End stage renal disease: Secondary | ICD-10-CM | POA: Diagnosis not present

## 2019-12-04 DIAGNOSIS — Z23 Encounter for immunization: Secondary | ICD-10-CM | POA: Diagnosis not present

## 2019-12-04 DIAGNOSIS — N2581 Secondary hyperparathyroidism of renal origin: Secondary | ICD-10-CM | POA: Diagnosis not present

## 2019-12-04 DIAGNOSIS — D631 Anemia in chronic kidney disease: Secondary | ICD-10-CM | POA: Diagnosis not present

## 2019-12-04 DIAGNOSIS — I10 Essential (primary) hypertension: Secondary | ICD-10-CM | POA: Diagnosis not present

## 2019-12-08 DIAGNOSIS — Z7682 Awaiting organ transplant status: Secondary | ICD-10-CM | POA: Diagnosis not present

## 2019-12-08 DIAGNOSIS — Z01818 Encounter for other preprocedural examination: Secondary | ICD-10-CM | POA: Diagnosis not present

## 2019-12-08 DIAGNOSIS — N186 End stage renal disease: Secondary | ICD-10-CM | POA: Diagnosis not present

## 2020-01-04 DIAGNOSIS — I10 Essential (primary) hypertension: Secondary | ICD-10-CM | POA: Diagnosis not present

## 2020-01-04 DIAGNOSIS — D631 Anemia in chronic kidney disease: Secondary | ICD-10-CM | POA: Diagnosis not present

## 2020-01-04 DIAGNOSIS — N2581 Secondary hyperparathyroidism of renal origin: Secondary | ICD-10-CM | POA: Diagnosis not present

## 2020-01-04 DIAGNOSIS — N186 End stage renal disease: Secondary | ICD-10-CM | POA: Diagnosis not present

## 2020-01-04 DIAGNOSIS — D509 Iron deficiency anemia, unspecified: Secondary | ICD-10-CM | POA: Diagnosis not present

## 2020-01-06 DIAGNOSIS — E042 Nontoxic multinodular goiter: Secondary | ICD-10-CM | POA: Diagnosis not present

## 2020-01-06 DIAGNOSIS — E041 Nontoxic single thyroid nodule: Secondary | ICD-10-CM | POA: Diagnosis not present

## 2020-01-07 DIAGNOSIS — Z992 Dependence on renal dialysis: Secondary | ICD-10-CM | POA: Diagnosis not present

## 2020-01-07 DIAGNOSIS — E782 Mixed hyperlipidemia: Secondary | ICD-10-CM | POA: Diagnosis not present

## 2020-01-07 DIAGNOSIS — I120 Hypertensive chronic kidney disease with stage 5 chronic kidney disease or end stage renal disease: Secondary | ICD-10-CM | POA: Diagnosis not present

## 2020-01-07 DIAGNOSIS — N186 End stage renal disease: Secondary | ICD-10-CM | POA: Diagnosis not present

## 2020-01-11 DIAGNOSIS — Z4932 Encounter for adequacy testing for peritoneal dialysis: Secondary | ICD-10-CM | POA: Diagnosis not present

## 2020-01-11 DIAGNOSIS — M109 Gout, unspecified: Secondary | ICD-10-CM | POA: Diagnosis not present

## 2020-01-15 ENCOUNTER — Encounter: Payer: Self-pay | Admitting: Podiatry

## 2020-01-15 ENCOUNTER — Ambulatory Visit (INDEPENDENT_AMBULATORY_CARE_PROVIDER_SITE_OTHER): Payer: PPO | Admitting: Podiatry

## 2020-01-15 ENCOUNTER — Other Ambulatory Visit: Payer: Self-pay

## 2020-01-15 DIAGNOSIS — L84 Corns and callosities: Secondary | ICD-10-CM | POA: Diagnosis not present

## 2020-01-15 DIAGNOSIS — M779 Enthesopathy, unspecified: Secondary | ICD-10-CM

## 2020-01-16 NOTE — Progress Notes (Signed)
Subjective:   Patient ID: [name], male   DOB: 79 y.o.   MRN: [id number]   HPI Patient presents stating he has a lesion on the plantar aspect of his right foot which has been very painful and make shoe gear difficult.  States it has been treated but it seems like it came back quick this time and he feels like there is fluid around it   ROS      Objective:  Physical Exam  Patient presents with inflammatory changes around the fourth metatarsal phalangeal joint right with deep keratotic lesion present that is painful when palpated     Assessment:  Inflammatory capsulitis right fourth MPJ with deep seeded lesion formation     Plan:  H&P reviewed condition discussed that there is no way most likely will be able to get rid of this permanently but we will continue to try to help him and I did sterile prep I injected the capsule 3 mg dexamethasone 5 mg Xylocaine I did deep debridement of the lesion with no iatrogenic bleeding noted.  Patient will be seen back to recheck as needed

## 2020-02-03 DIAGNOSIS — N2581 Secondary hyperparathyroidism of renal origin: Secondary | ICD-10-CM | POA: Diagnosis not present

## 2020-02-03 DIAGNOSIS — D631 Anemia in chronic kidney disease: Secondary | ICD-10-CM | POA: Diagnosis not present

## 2020-02-03 DIAGNOSIS — I10 Essential (primary) hypertension: Secondary | ICD-10-CM | POA: Diagnosis not present

## 2020-02-03 DIAGNOSIS — D509 Iron deficiency anemia, unspecified: Secondary | ICD-10-CM | POA: Diagnosis not present

## 2020-02-03 DIAGNOSIS — N186 End stage renal disease: Secondary | ICD-10-CM | POA: Diagnosis not present

## 2020-03-05 DIAGNOSIS — D509 Iron deficiency anemia, unspecified: Secondary | ICD-10-CM | POA: Diagnosis not present

## 2020-03-05 DIAGNOSIS — D631 Anemia in chronic kidney disease: Secondary | ICD-10-CM | POA: Diagnosis not present

## 2020-03-05 DIAGNOSIS — N2581 Secondary hyperparathyroidism of renal origin: Secondary | ICD-10-CM | POA: Diagnosis not present

## 2020-03-05 DIAGNOSIS — I10 Essential (primary) hypertension: Secondary | ICD-10-CM | POA: Diagnosis not present

## 2020-03-05 DIAGNOSIS — N186 End stage renal disease: Secondary | ICD-10-CM | POA: Diagnosis not present

## 2020-03-08 DIAGNOSIS — N186 End stage renal disease: Secondary | ICD-10-CM | POA: Diagnosis not present

## 2020-03-08 DIAGNOSIS — Z7682 Awaiting organ transplant status: Secondary | ICD-10-CM | POA: Diagnosis not present

## 2020-03-08 DIAGNOSIS — Z01818 Encounter for other preprocedural examination: Secondary | ICD-10-CM | POA: Diagnosis not present

## 2020-04-05 DIAGNOSIS — N2581 Secondary hyperparathyroidism of renal origin: Secondary | ICD-10-CM | POA: Diagnosis not present

## 2020-04-05 DIAGNOSIS — I10 Essential (primary) hypertension: Secondary | ICD-10-CM | POA: Diagnosis not present

## 2020-04-05 DIAGNOSIS — N186 End stage renal disease: Secondary | ICD-10-CM | POA: Diagnosis not present

## 2020-04-05 DIAGNOSIS — D631 Anemia in chronic kidney disease: Secondary | ICD-10-CM | POA: Diagnosis not present

## 2020-04-05 DIAGNOSIS — D509 Iron deficiency anemia, unspecified: Secondary | ICD-10-CM | POA: Diagnosis not present

## 2020-04-13 DIAGNOSIS — M1712 Unilateral primary osteoarthritis, left knee: Secondary | ICD-10-CM | POA: Diagnosis not present

## 2020-04-13 DIAGNOSIS — M25562 Pain in left knee: Secondary | ICD-10-CM | POA: Diagnosis not present

## 2020-04-13 DIAGNOSIS — G8929 Other chronic pain: Secondary | ICD-10-CM | POA: Diagnosis not present

## 2020-04-18 ENCOUNTER — Ambulatory Visit (INDEPENDENT_AMBULATORY_CARE_PROVIDER_SITE_OTHER): Payer: PPO

## 2020-04-18 ENCOUNTER — Ambulatory Visit: Payer: PPO | Admitting: Podiatry

## 2020-04-18 ENCOUNTER — Other Ambulatory Visit: Payer: Self-pay

## 2020-04-18 DIAGNOSIS — M79672 Pain in left foot: Secondary | ICD-10-CM | POA: Diagnosis not present

## 2020-04-18 DIAGNOSIS — M79671 Pain in right foot: Secondary | ICD-10-CM

## 2020-04-18 DIAGNOSIS — L84 Corns and callosities: Secondary | ICD-10-CM | POA: Diagnosis not present

## 2020-04-18 DIAGNOSIS — M722 Plantar fascial fibromatosis: Secondary | ICD-10-CM | POA: Diagnosis not present

## 2020-04-18 NOTE — Progress Notes (Signed)
Subjective:   Patient ID: [name], male   DOB: 80 y.o.   MRN: [id number]   HPI Patient presents stating of having a lot of pain in the bottom of my feet and I am walking on my bones and it is making increasingly difficult for me to be active.  States he also has knee problems and may have to have a knee replacement and is limping is not helping   ROS      Objective:  Physical Exam  Neurovascular status intact with prominent metatarsal hurt fourth metatarsal right fifth metatarsal rough that are painful with keratotic lesion and also discomfort into his arch and heels at times with limping pattern and only short periods of time relief with treatment      Assessment:  Chronic structural changes consistent with foot structure with inflammatory changes bone structural changes and callus formation      Plan:  H&P reviewed conditions and debrided lesions bilateral advised on long-term orthotics to try to control the symptoms and reduce the stress that he is experiencing on his feet.  Patient will be casted for functional orthotics to try to reduce the chronic stress that he is experiencing and patient also is on dialysis and has a lot of stress on his system

## 2020-04-20 ENCOUNTER — Telehealth: Payer: Self-pay | Admitting: Podiatry

## 2020-04-20 NOTE — Telephone Encounter (Signed)
Left message for pt that we did get authorization for the orthotics from [employer] and of course he may have deductible/copay or co insurance due and to call if any further questions.

## 2020-04-28 DIAGNOSIS — Z005 Encounter for examination of potential donor of organ and tissue: Secondary | ICD-10-CM | POA: Diagnosis not present

## 2020-04-28 DIAGNOSIS — Z125 Encounter for screening for malignant neoplasm of prostate: Secondary | ICD-10-CM | POA: Diagnosis not present

## 2020-05-03 DIAGNOSIS — I10 Essential (primary) hypertension: Secondary | ICD-10-CM | POA: Diagnosis not present

## 2020-05-03 DIAGNOSIS — D509 Iron deficiency anemia, unspecified: Secondary | ICD-10-CM | POA: Diagnosis not present

## 2020-05-03 DIAGNOSIS — N186 End stage renal disease: Secondary | ICD-10-CM | POA: Diagnosis not present

## 2020-05-03 DIAGNOSIS — D631 Anemia in chronic kidney disease: Secondary | ICD-10-CM | POA: Diagnosis not present

## 2020-05-03 DIAGNOSIS — N2581 Secondary hyperparathyroidism of renal origin: Secondary | ICD-10-CM | POA: Diagnosis not present

## 2020-05-24 ENCOUNTER — Ambulatory Visit (INDEPENDENT_AMBULATORY_CARE_PROVIDER_SITE_OTHER): Payer: PPO | Admitting: *Deleted

## 2020-05-24 ENCOUNTER — Other Ambulatory Visit: Payer: Self-pay

## 2020-05-24 DIAGNOSIS — M722 Plantar fascial fibromatosis: Secondary | ICD-10-CM

## 2020-05-24 NOTE — Progress Notes (Signed)
Patient presents today to pick up custom molded foot orthotics, diagnosed with plantar fasciitis by Dr. Paulla Dolly.   Orthotics were dispensed and fit was satisfactory. Reviewed instructions for break-in and wear. Written instructions given to patient.  Patient will follow up as needed.   [facility] - order # [id number]

## 2020-06-03 DIAGNOSIS — N2581 Secondary hyperparathyroidism of renal origin: Secondary | ICD-10-CM | POA: Diagnosis not present

## 2020-06-03 DIAGNOSIS — D631 Anemia in chronic kidney disease: Secondary | ICD-10-CM | POA: Diagnosis not present

## 2020-06-03 DIAGNOSIS — N186 End stage renal disease: Secondary | ICD-10-CM | POA: Diagnosis not present

## 2020-06-04 DIAGNOSIS — I10 Essential (primary) hypertension: Secondary | ICD-10-CM | POA: Diagnosis not present

## 2020-06-04 DIAGNOSIS — N186 End stage renal disease: Secondary | ICD-10-CM | POA: Diagnosis not present

## 2020-06-04 DIAGNOSIS — D649 Anemia, unspecified: Secondary | ICD-10-CM | POA: Diagnosis not present

## 2020-06-09 DIAGNOSIS — Z7682 Awaiting organ transplant status: Secondary | ICD-10-CM | POA: Diagnosis not present

## 2020-06-09 DIAGNOSIS — Z01818 Encounter for other preprocedural examination: Secondary | ICD-10-CM | POA: Diagnosis not present

## 2020-06-09 DIAGNOSIS — N186 End stage renal disease: Secondary | ICD-10-CM | POA: Diagnosis not present

## 2020-06-23 ENCOUNTER — Encounter: Payer: Self-pay | Admitting: Podiatry

## 2020-06-23 ENCOUNTER — Ambulatory Visit: Payer: PPO | Admitting: Podiatry

## 2020-06-23 ENCOUNTER — Other Ambulatory Visit: Payer: Self-pay

## 2020-06-23 DIAGNOSIS — L84 Corns and callosities: Secondary | ICD-10-CM | POA: Diagnosis not present

## 2020-06-24 DIAGNOSIS — R52 Pain, unspecified: Secondary | ICD-10-CM | POA: Diagnosis not present

## 2020-06-24 DIAGNOSIS — R109 Unspecified abdominal pain: Secondary | ICD-10-CM | POA: Diagnosis not present

## 2020-06-28 NOTE — Progress Notes (Signed)
Subjective:   Patient ID: [name], male   DOB: 80 y.o.   MRN: [id number]   HPI Patient presents stating that he is still having pain in his right foot and that the orthotics are helping but still having pain   ROS      Objective:  Physical Exam  Neurovascular status intact with lesions which appear to be more due to bone pressure which is occurring left over right with chronic pain associated with this     Assessment:  Bone pressure creating stress against the joint surfaces with keratotic tissue formation     Plan:  Orthotics are helping and working to try to modify them for different shoe gear and I debrided lesions today no iatrogenic bleeding which can be done routinely

## 2020-07-03 DIAGNOSIS — I10 Essential (primary) hypertension: Secondary | ICD-10-CM | POA: Diagnosis not present

## 2020-07-03 DIAGNOSIS — D509 Iron deficiency anemia, unspecified: Secondary | ICD-10-CM | POA: Diagnosis not present

## 2020-07-03 DIAGNOSIS — N186 End stage renal disease: Secondary | ICD-10-CM | POA: Diagnosis not present

## 2020-07-03 DIAGNOSIS — N2581 Secondary hyperparathyroidism of renal origin: Secondary | ICD-10-CM | POA: Diagnosis not present

## 2020-07-03 DIAGNOSIS — D631 Anemia in chronic kidney disease: Secondary | ICD-10-CM | POA: Diagnosis not present

## 2020-07-06 DIAGNOSIS — Z992 Dependence on renal dialysis: Secondary | ICD-10-CM | POA: Diagnosis not present

## 2020-07-06 DIAGNOSIS — N186 End stage renal disease: Secondary | ICD-10-CM | POA: Diagnosis not present

## 2020-07-06 DIAGNOSIS — E782 Mixed hyperlipidemia: Secondary | ICD-10-CM | POA: Diagnosis not present

## 2020-07-06 DIAGNOSIS — D631 Anemia in chronic kidney disease: Secondary | ICD-10-CM | POA: Diagnosis not present

## 2020-07-06 DIAGNOSIS — I120 Hypertensive chronic kidney disease with stage 5 chronic kidney disease or end stage renal disease: Secondary | ICD-10-CM | POA: Diagnosis not present

## 2020-07-06 DIAGNOSIS — E038 Other specified hypothyroidism: Secondary | ICD-10-CM | POA: Diagnosis not present

## 2020-07-06 DIAGNOSIS — I77810 Thoracic aortic ectasia: Secondary | ICD-10-CM | POA: Diagnosis not present

## 2020-08-03 DIAGNOSIS — Z961 Presence of intraocular lens: Secondary | ICD-10-CM | POA: Diagnosis not present

## 2020-08-03 DIAGNOSIS — D509 Iron deficiency anemia, unspecified: Secondary | ICD-10-CM | POA: Diagnosis not present

## 2020-08-03 DIAGNOSIS — D631 Anemia in chronic kidney disease: Secondary | ICD-10-CM | POA: Diagnosis not present

## 2020-08-03 DIAGNOSIS — Z01818 Encounter for other preprocedural examination: Secondary | ICD-10-CM | POA: Diagnosis not present

## 2020-08-03 DIAGNOSIS — N2581 Secondary hyperparathyroidism of renal origin: Secondary | ICD-10-CM | POA: Diagnosis not present

## 2020-08-03 DIAGNOSIS — Z7682 Awaiting organ transplant status: Secondary | ICD-10-CM | POA: Diagnosis not present

## 2020-08-03 DIAGNOSIS — H43812 Vitreous degeneration, left eye: Secondary | ICD-10-CM | POA: Diagnosis not present

## 2020-08-03 DIAGNOSIS — I10 Essential (primary) hypertension: Secondary | ICD-10-CM | POA: Diagnosis not present

## 2020-08-03 DIAGNOSIS — N186 End stage renal disease: Secondary | ICD-10-CM | POA: Diagnosis not present

## 2020-08-25 ENCOUNTER — Ambulatory Visit (INDEPENDENT_AMBULATORY_CARE_PROVIDER_SITE_OTHER): Payer: PPO | Admitting: Podiatry

## 2020-08-25 ENCOUNTER — Ambulatory Visit (INDEPENDENT_AMBULATORY_CARE_PROVIDER_SITE_OTHER): Payer: PPO

## 2020-08-25 ENCOUNTER — Other Ambulatory Visit: Payer: Self-pay

## 2020-08-25 DIAGNOSIS — L84 Corns and callosities: Secondary | ICD-10-CM

## 2020-08-25 DIAGNOSIS — B351 Tinea unguium: Secondary | ICD-10-CM

## 2020-08-25 DIAGNOSIS — M79675 Pain in left toe(s): Secondary | ICD-10-CM

## 2020-08-25 DIAGNOSIS — M79674 Pain in right toe(s): Secondary | ICD-10-CM | POA: Diagnosis not present

## 2020-08-25 DIAGNOSIS — M21622 Bunionette of left foot: Secondary | ICD-10-CM

## 2020-08-26 ENCOUNTER — Telehealth: Payer: Self-pay | Admitting: Urology

## 2020-08-26 NOTE — Telephone Encounter (Signed)
DOS - 09/13/20  MET HEAD RESECTION 5TH LEFT --- 28113   [employer] EFFECTIVE DATE - 03/06/19  RECEIVED FAX FROM [employer] STATING CPT CODE 40375 HAS BEEN APPROVED, AUTH # [id number], GOOD FROM 09/13/20 - 12/12/20.

## 2020-08-26 NOTE — Progress Notes (Signed)
Subjective:   Patient ID: [name], male   DOB: 80 y.o.   MRN: [id number]   HPI Patient presents stating this lesion on my left foot is very sore and I am simply not able to bear weight and I need a more permanent solution.  My nails are getting thickened again I cannot cut them and these lesions are very tender bilateral   ROS      Objective:  Physical Exam  Neurovascular status found to be intact good digital perfusion noted with patient found to have prominent fifth metatarsal head left that is painful when pressed and is making walking difficult with lesions bilateral feet first and fifth metatarsal thickened elongated nailbeds 1-5 both feet that become bothersome.  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  I did review there is no guarantees of healing or that this will solve his problem and he understands this completely and wants procedure done.  I then debrided lesions on both feet first and fifth metatarsal and nailbeds 1 through 5 both feet with no iatrogenic bleeding and patient will be seen back to recheck for surgical intervention in the next several weeks and is scheduled today with all questions answered

## 2020-09-02 DIAGNOSIS — N2581 Secondary hyperparathyroidism of renal origin: Secondary | ICD-10-CM | POA: Diagnosis not present

## 2020-09-02 DIAGNOSIS — I10 Essential (primary) hypertension: Secondary | ICD-10-CM | POA: Diagnosis not present

## 2020-09-02 DIAGNOSIS — Z7682 Awaiting organ transplant status: Secondary | ICD-10-CM | POA: Diagnosis not present

## 2020-09-02 DIAGNOSIS — D509 Iron deficiency anemia, unspecified: Secondary | ICD-10-CM | POA: Diagnosis not present

## 2020-09-02 DIAGNOSIS — N186 End stage renal disease: Secondary | ICD-10-CM | POA: Diagnosis not present

## 2020-09-02 DIAGNOSIS — D631 Anemia in chronic kidney disease: Secondary | ICD-10-CM | POA: Diagnosis not present

## 2020-09-02 DIAGNOSIS — Z01818 Encounter for other preprocedural examination: Secondary | ICD-10-CM | POA: Diagnosis not present

## 2020-09-12 MED ORDER — HYDROCODONE-ACETAMINOPHEN 10-325 MG PO TABS: 1.0000 | ORAL_TABLET | Freq: Three times a day (TID) | ORAL | 0 refills | Status: AC | PRN

## 2020-09-12 NOTE — Addendum Note (Signed)
Addended by: Wallene Huh on: 09/12/2020 01:39 PM   Modules accepted: Orders

## 2020-09-13 ENCOUNTER — Encounter: Payer: Self-pay | Admitting: Podiatry

## 2020-09-13 DIAGNOSIS — M2012 Hallux valgus (acquired), left foot: Secondary | ICD-10-CM | POA: Diagnosis not present

## 2020-09-13 DIAGNOSIS — M21622 Bunionette of left foot: Secondary | ICD-10-CM | POA: Diagnosis not present

## 2020-09-13 DIAGNOSIS — M21612 Bunion of left foot: Secondary | ICD-10-CM | POA: Diagnosis not present

## 2020-09-15 ENCOUNTER — Telehealth: Payer: Self-pay

## 2020-09-19 ENCOUNTER — Ambulatory Visit (INDEPENDENT_AMBULATORY_CARE_PROVIDER_SITE_OTHER): Payer: PPO | Admitting: Podiatry

## 2020-09-19 ENCOUNTER — Encounter: Payer: Self-pay | Admitting: Podiatry

## 2020-09-19 ENCOUNTER — Ambulatory Visit (INDEPENDENT_AMBULATORY_CARE_PROVIDER_SITE_OTHER): Payer: PPO

## 2020-09-19 ENCOUNTER — Other Ambulatory Visit: Payer: Self-pay

## 2020-09-19 DIAGNOSIS — Z9889 Other specified postprocedural states: Secondary | ICD-10-CM

## 2020-09-20 NOTE — Progress Notes (Signed)
Subjective:   Patient ID: [name], male   DOB: 80 y.o.   MRN: [id number]   HPI Patient presents stating that I am doing very well with minimal discomfort   ROS      Objective:  Physical Exam  Neurovascular status intact with patient's left fifth metatarsal healing well stitches intact wound edges well coapted     Assessment:  Been well post fifth metatarsal head resection left     Plan:  H&P reviewed condition sterile dressing reapplied reviewed x-ray and advised this patient on gradual increase in activity continue boot surgical shoe usage and reappoint 2 weeks for suture removal  X-rays indicate satisfactory removal of head of fifth metatarsal left

## 2020-10-03 DIAGNOSIS — N2581 Secondary hyperparathyroidism of renal origin: Secondary | ICD-10-CM | POA: Diagnosis not present

## 2020-10-03 DIAGNOSIS — I10 Essential (primary) hypertension: Secondary | ICD-10-CM | POA: Diagnosis not present

## 2020-10-03 DIAGNOSIS — D631 Anemia in chronic kidney disease: Secondary | ICD-10-CM | POA: Diagnosis not present

## 2020-10-03 DIAGNOSIS — D509 Iron deficiency anemia, unspecified: Secondary | ICD-10-CM | POA: Diagnosis not present

## 2020-10-03 DIAGNOSIS — N186 End stage renal disease: Secondary | ICD-10-CM | POA: Diagnosis not present

## 2020-10-05 ENCOUNTER — Other Ambulatory Visit: Payer: Self-pay

## 2020-10-05 ENCOUNTER — Ambulatory Visit (INDEPENDENT_AMBULATORY_CARE_PROVIDER_SITE_OTHER): Payer: PPO

## 2020-10-05 DIAGNOSIS — Z9889 Other specified postprocedural states: Secondary | ICD-10-CM

## 2020-10-05 NOTE — Progress Notes (Signed)
Patient in office today for suture removal. Sutures removed from the 5th digit, lateral aspect of the left foot. Patient tolerated the removal well. Patient denies nausea, vomiting, fever and chills. Advised patient to call the office with any questions, comments or concerns. Patient verbalized understanding.

## 2020-10-17 NOTE — Telephone Encounter (Signed)
POC

## 2020-10-19 ENCOUNTER — Other Ambulatory Visit: Payer: Self-pay

## 2020-10-19 ENCOUNTER — Ambulatory Visit (INDEPENDENT_AMBULATORY_CARE_PROVIDER_SITE_OTHER): Payer: PPO

## 2020-10-19 ENCOUNTER — Encounter: Payer: Self-pay | Admitting: Podiatry

## 2020-10-19 ENCOUNTER — Ambulatory Visit (INDEPENDENT_AMBULATORY_CARE_PROVIDER_SITE_OTHER): Payer: PPO | Admitting: Podiatry

## 2020-10-19 DIAGNOSIS — Z9889 Other specified postprocedural states: Secondary | ICD-10-CM | POA: Diagnosis not present

## 2020-10-19 DIAGNOSIS — L84 Corns and callosities: Secondary | ICD-10-CM

## 2020-10-20 NOTE — Progress Notes (Signed)
Subjective:   Patient ID: [name], male   DOB: 80 y.o.   MRN: [id number]   HPI Patient presents stating he is very happy with his left foot healing well and his right he is got a very painful lesion that he needs to trim   ROS      Objective:  Physical Exam  Neurovascular status intact negative Bevelyn Buckles' sign noted wound edges well coapted left no discomfort noted plantarly with patient right foot found to have plantar keratotic lesions of fourth fifth metatarsal painful when palpated     Assessment:  Doing well post fifth metatarsal head resection left with good healing occurring with right showing 2 keratotic lesions painful     Plan:  H&P reviewed condition and for the left I have recommended the continuation of wider shoe gear and gradual increase in activity and for the right I did deep debridement of lesions no iatrogenic bleeding reappoint as needed  X-rays indicate satisfactory resection head of metatarsal left

## 2020-11-03 DIAGNOSIS — D509 Iron deficiency anemia, unspecified: Secondary | ICD-10-CM | POA: Diagnosis not present

## 2020-11-03 DIAGNOSIS — D631 Anemia in chronic kidney disease: Secondary | ICD-10-CM | POA: Diagnosis not present

## 2020-11-03 DIAGNOSIS — I10 Essential (primary) hypertension: Secondary | ICD-10-CM | POA: Diagnosis not present

## 2020-11-03 DIAGNOSIS — D649 Anemia, unspecified: Secondary | ICD-10-CM | POA: Diagnosis not present

## 2020-11-03 DIAGNOSIS — N186 End stage renal disease: Secondary | ICD-10-CM | POA: Diagnosis not present

## 2020-11-03 DIAGNOSIS — N2581 Secondary hyperparathyroidism of renal origin: Secondary | ICD-10-CM | POA: Diagnosis not present

## 2020-12-03 DIAGNOSIS — I10 Essential (primary) hypertension: Secondary | ICD-10-CM | POA: Diagnosis not present

## 2020-12-03 DIAGNOSIS — D509 Iron deficiency anemia, unspecified: Secondary | ICD-10-CM | POA: Diagnosis not present

## 2020-12-03 DIAGNOSIS — N186 End stage renal disease: Secondary | ICD-10-CM | POA: Diagnosis not present

## 2020-12-03 DIAGNOSIS — N2581 Secondary hyperparathyroidism of renal origin: Secondary | ICD-10-CM | POA: Diagnosis not present

## 2020-12-03 DIAGNOSIS — D631 Anemia in chronic kidney disease: Secondary | ICD-10-CM | POA: Diagnosis not present

## 2020-12-03 DIAGNOSIS — D649 Anemia, unspecified: Secondary | ICD-10-CM | POA: Diagnosis not present

## 2020-12-07 DIAGNOSIS — L570 Actinic keratosis: Secondary | ICD-10-CM | POA: Diagnosis not present

## 2020-12-07 DIAGNOSIS — L821 Other seborrheic keratosis: Secondary | ICD-10-CM | POA: Diagnosis not present

## 2020-12-07 DIAGNOSIS — Z86008 Personal history of in-situ neoplasm of other site: Secondary | ICD-10-CM | POA: Diagnosis not present

## 2020-12-07 DIAGNOSIS — L218 Other seborrheic dermatitis: Secondary | ICD-10-CM | POA: Diagnosis not present

## 2021-01-03 DIAGNOSIS — D509 Iron deficiency anemia, unspecified: Secondary | ICD-10-CM | POA: Diagnosis not present

## 2021-01-03 DIAGNOSIS — Z23 Encounter for immunization: Secondary | ICD-10-CM | POA: Diagnosis not present

## 2021-01-03 DIAGNOSIS — D631 Anemia in chronic kidney disease: Secondary | ICD-10-CM | POA: Diagnosis not present

## 2021-01-03 DIAGNOSIS — N186 End stage renal disease: Secondary | ICD-10-CM | POA: Diagnosis not present

## 2021-01-03 DIAGNOSIS — N2581 Secondary hyperparathyroidism of renal origin: Secondary | ICD-10-CM | POA: Diagnosis not present

## 2021-01-03 DIAGNOSIS — I10 Essential (primary) hypertension: Secondary | ICD-10-CM | POA: Diagnosis not present

## 2021-01-03 DIAGNOSIS — D649 Anemia, unspecified: Secondary | ICD-10-CM | POA: Diagnosis not present

## 2021-01-06 DIAGNOSIS — Z992 Dependence on renal dialysis: Secondary | ICD-10-CM | POA: Diagnosis not present

## 2021-01-06 DIAGNOSIS — E782 Mixed hyperlipidemia: Secondary | ICD-10-CM | POA: Diagnosis not present

## 2021-01-06 DIAGNOSIS — E038 Other specified hypothyroidism: Secondary | ICD-10-CM | POA: Diagnosis not present

## 2021-01-06 DIAGNOSIS — D631 Anemia in chronic kidney disease: Secondary | ICD-10-CM | POA: Diagnosis not present

## 2021-01-06 DIAGNOSIS — I120 Hypertensive chronic kidney disease with stage 5 chronic kidney disease or end stage renal disease: Secondary | ICD-10-CM | POA: Diagnosis not present

## 2021-01-06 DIAGNOSIS — N186 End stage renal disease: Secondary | ICD-10-CM | POA: Diagnosis not present

## 2021-01-17 DIAGNOSIS — E038 Other specified hypothyroidism: Secondary | ICD-10-CM | POA: Diagnosis not present

## 2021-01-17 DIAGNOSIS — Z992 Dependence on renal dialysis: Secondary | ICD-10-CM | POA: Diagnosis not present

## 2021-01-17 DIAGNOSIS — N186 End stage renal disease: Secondary | ICD-10-CM | POA: Diagnosis not present

## 2021-01-17 DIAGNOSIS — E782 Mixed hyperlipidemia: Secondary | ICD-10-CM | POA: Diagnosis not present

## 2021-01-17 DIAGNOSIS — I120 Hypertensive chronic kidney disease with stage 5 chronic kidney disease or end stage renal disease: Secondary | ICD-10-CM | POA: Diagnosis not present

## 2021-05-02 IMAGING — DX DG FOOT COMPLETE 3+V*R*
3 series · 3 of 3 positions shown · non-contrast
Comparison: Right foot radiograph dated 02/19/2018.

CLINICAL DATA: 78-year-old male with right foot pain.

EXAM:
RIGHT FOOT COMPLETE - 3+ VIEW

[foot ap]
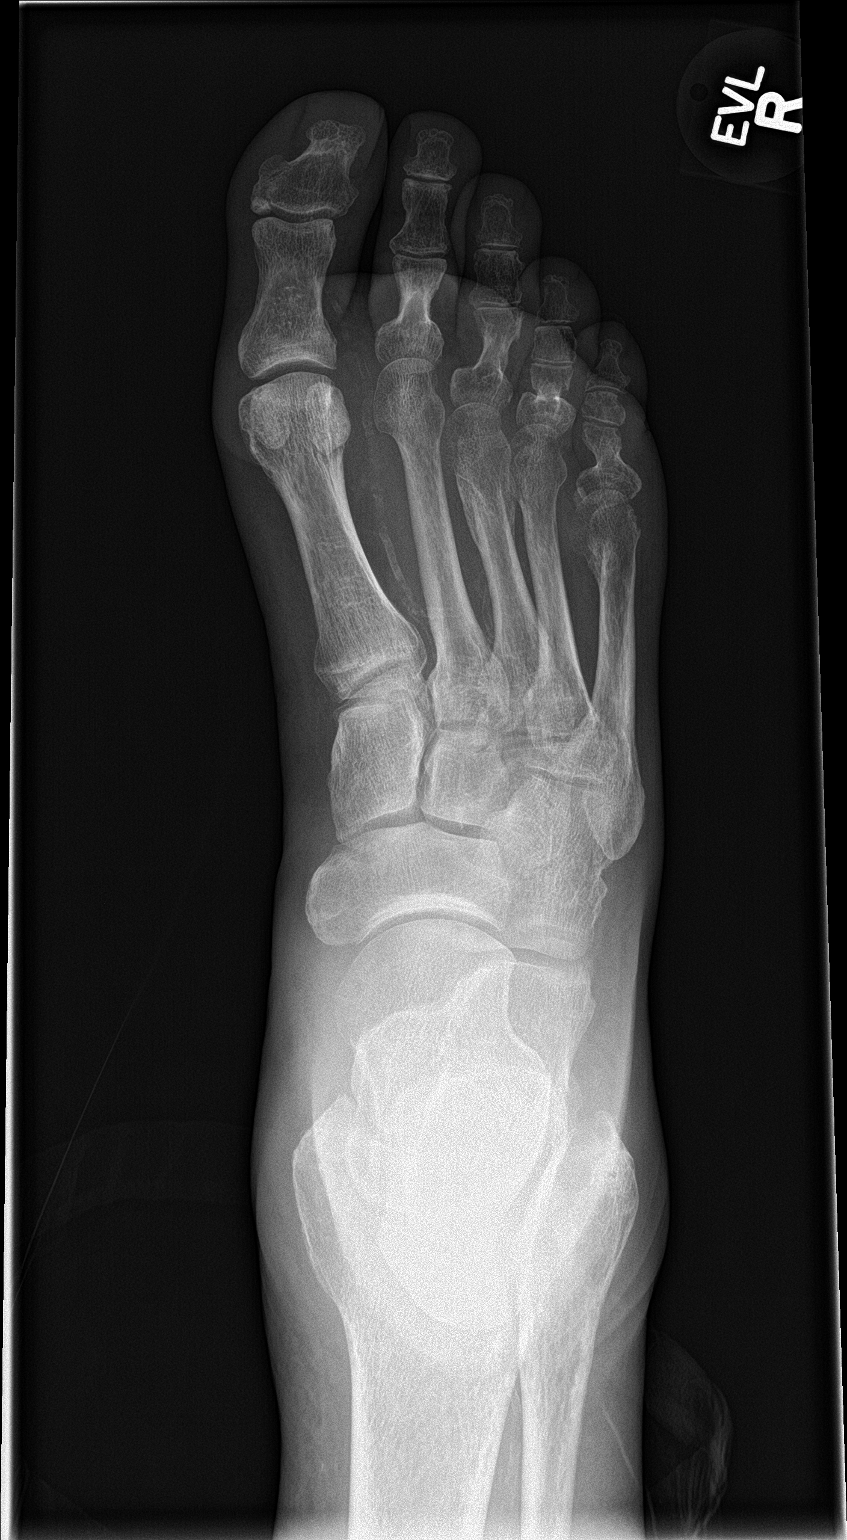

[foot obl]
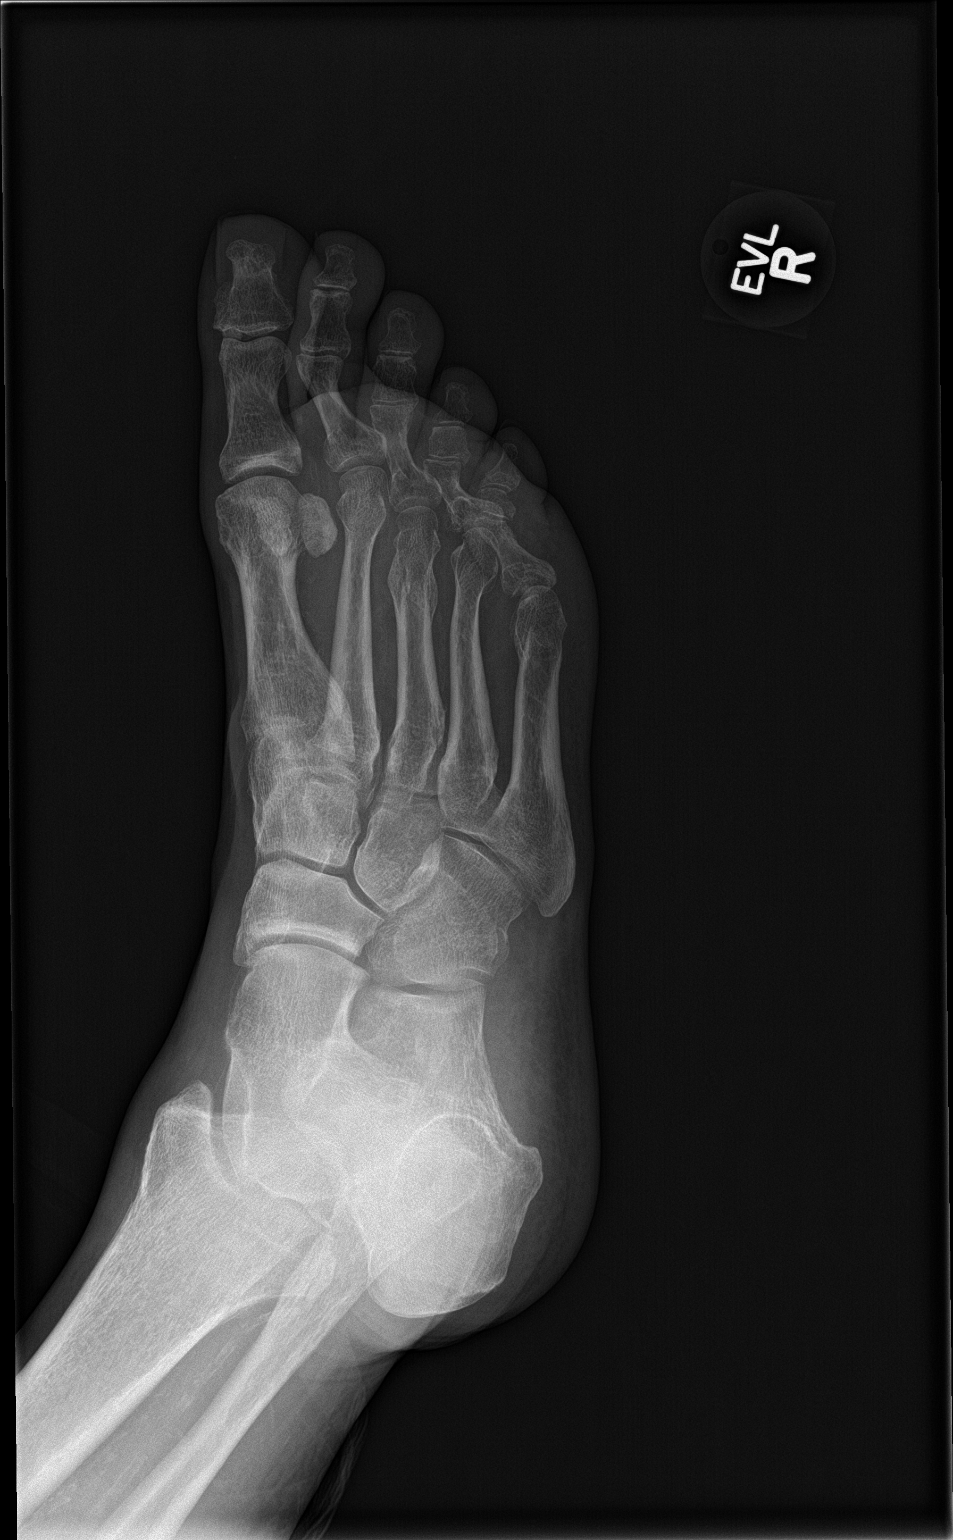

[foot lat]
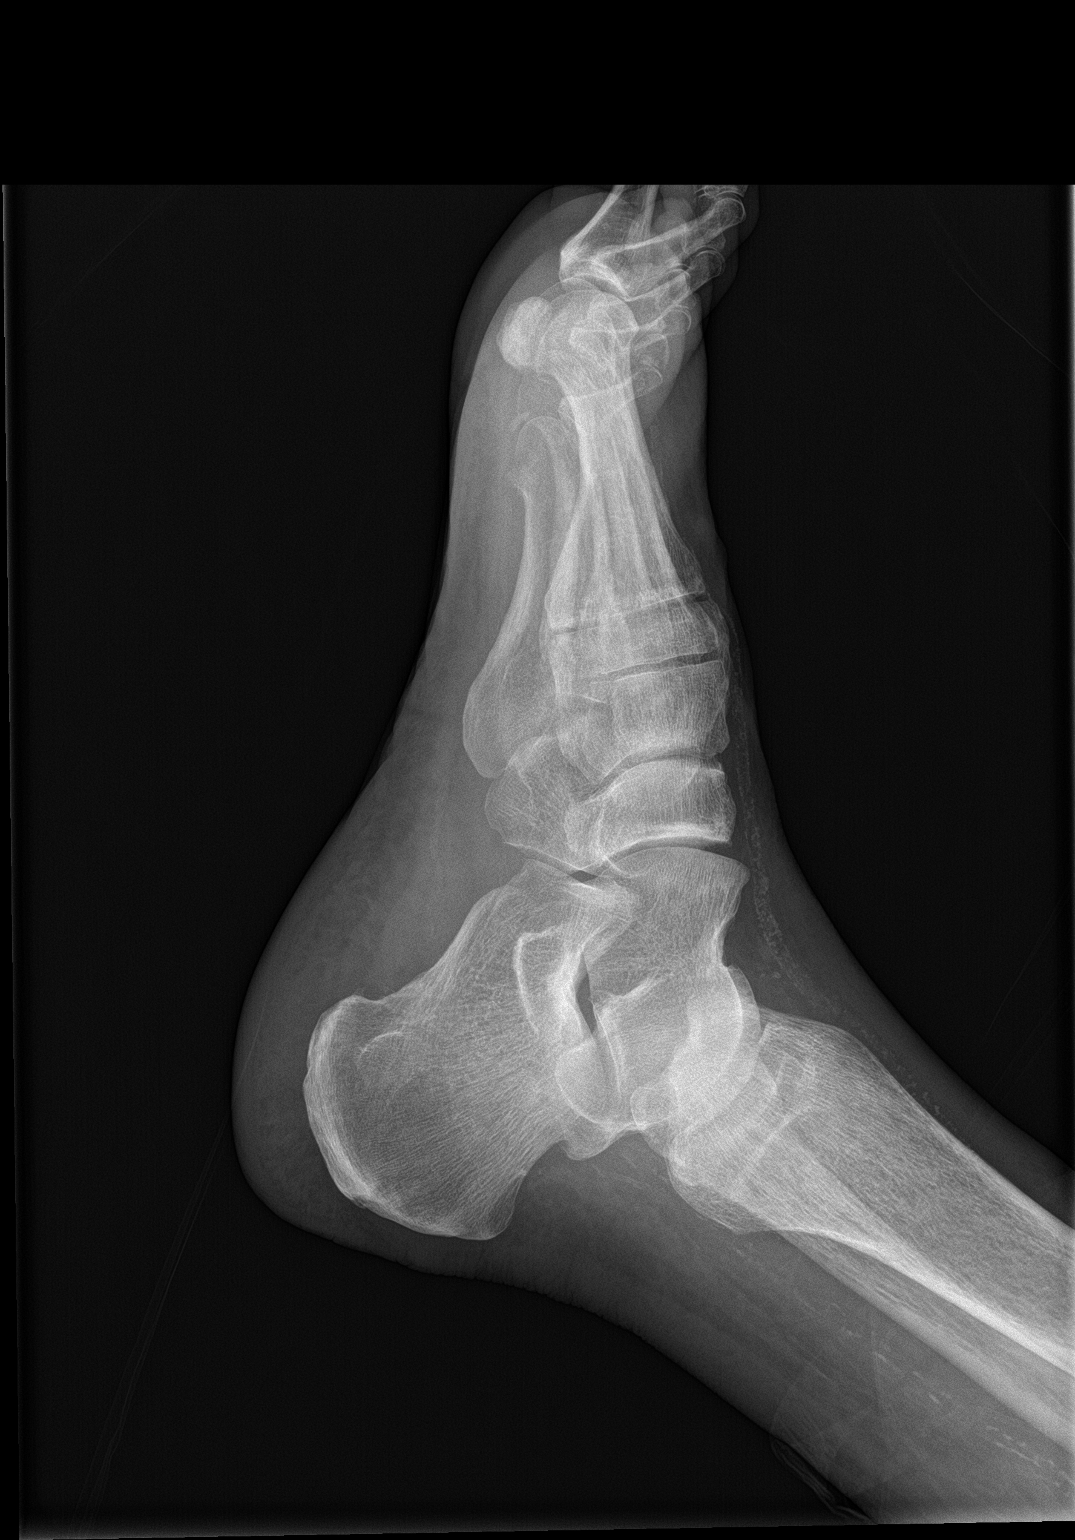

[3 of 3 positions shown; findings below may reference images not displayed]

FINDINGS: There is no acute fracture or dislocation. Old healed fracture of
the third metatarsal. The bones are osteopenic. Vascular
calcifications noted. The soft tissues are unremarkable.
IMPRESSION: No acute fracture or dislocation.

## 2021-05-03 ENCOUNTER — Other Ambulatory Visit: Payer: Self-pay

## 2021-05-03 ENCOUNTER — Encounter: Payer: Self-pay | Admitting: Podiatry

## 2021-05-03 ENCOUNTER — Ambulatory Visit: Payer: PPO | Admitting: Podiatry

## 2021-05-03 DIAGNOSIS — L84 Corns and callosities: Secondary | ICD-10-CM

## 2021-05-04 NOTE — Progress Notes (Signed)
Subjective:  ? ?Patient ID: [name], male   DOB: 81 y.o.   MRN: [id number]  ? ?HPI ?Patient has done well with surgery but does have lesions on both feet that are tender and make walking difficult ? ? ?ROS ? ? ?   ?Objective:  ?Physical Exam  ?Neurovascular status unchanged with thick keratotic lesions of fifth metatarsal bilateral lucent course pain ? ?   ?Assessment:  ?Corn callus formation painful with prominent bone structure ? ?   ?Plan:  ?Debridement of lesions no iatrogenic bleeding reappoint routine care ?   ? ? ?

## 2021-06-22 ENCOUNTER — Ambulatory Visit: Payer: PPO | Admitting: Podiatry

## 2021-06-22 ENCOUNTER — Encounter: Payer: Self-pay | Admitting: Podiatry

## 2021-06-22 ENCOUNTER — Ambulatory Visit (INDEPENDENT_AMBULATORY_CARE_PROVIDER_SITE_OTHER): Payer: PPO

## 2021-06-22 DIAGNOSIS — M722 Plantar fascial fibromatosis: Secondary | ICD-10-CM

## 2021-06-22 DIAGNOSIS — M7672 Peroneal tendinitis, left leg: Secondary | ICD-10-CM | POA: Diagnosis not present

## 2021-06-22 MED ORDER — TRIAMCINOLONE ACETONIDE 10 MG/ML IJ SUSP
10.0000 mg | Freq: Once | INTRAMUSCULAR | Status: AC
  Administered 2021-06-22: 10 mg

## 2021-06-23 NOTE — Progress Notes (Signed)
Subjective:  ? ?Patient ID: [name], male   DOB: 81 y.o.   MRN: [id number]  ? ?HPI ?Patient states he is doing very well with his previous surgery but is having pain in the base of the fifth metatarsal of his left foot ? ? ?ROS ? ? ?   ?Objective:  ?Physical Exam  ?Neurovascular status intact with pain discomfort at the base of the fifth metatarsal left with inflammation at the insertion of the tendon ? ?   ?Assessment:  ?Peroneal tendinitis left with inflammation ? ?   ?Plan:  ?Reviewed condition and did careful sterile prep and injected the sheath of the tendon 3 mg dexamethasone Kenalog 5 mg Xylocaine and advised on ice therapy support shoes and reappoint as needed ? ?X-rays were negative that there is a fracture associated with this increased pain and swelling.  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   ? ? ?

## 2021-07-12 ENCOUNTER — Ambulatory Visit: Payer: PPO | Admitting: Podiatry

## 2021-07-12 ENCOUNTER — Encounter: Payer: Self-pay | Admitting: Podiatry

## 2021-07-12 DIAGNOSIS — M778 Other enthesopathies, not elsewhere classified: Secondary | ICD-10-CM

## 2021-07-12 MED ORDER — TRIAMCINOLONE ACETONIDE 10 MG/ML IJ SUSP
10.0000 mg | Freq: Once | INTRAMUSCULAR | Status: AC
  Administered 2021-07-12: 10 mg

## 2021-07-12 NOTE — Progress Notes (Signed)
Subjective:  ? ?Patient ID: [name], male   DOB: 81 y.o.   MRN: [id number]  ? ?HPI ?Patient states I am still having 1 spot on my left foot that is sore and the surgery itself is done great ? ? ?ROS ? ? ?   ?Objective:  ?Physical Exam  ?Neurovascular status intact with inflammation now of the fifth metatarsal base around the plantar capsule and I also noted a small lesion in the area ? ?   ?Assessment:  ?Inflammatory capsulitis base of fifth MPJ left with keratotic tissue formation ? ?   ?Plan:  ?I injected the base 3 mg dexamethasone 5 mg Xylocaine Kenalog and I debrided the lesion and he will be seen back as needed hopefully this will be the end of the problem ?   ? ? ?

## 2021-08-24 ENCOUNTER — Encounter: Payer: Self-pay | Admitting: Podiatry

## 2021-08-24 ENCOUNTER — Ambulatory Visit: Payer: PPO | Admitting: Podiatry

## 2021-08-24 DIAGNOSIS — L84 Corns and callosities: Secondary | ICD-10-CM | POA: Diagnosis not present

## 2021-08-24 DIAGNOSIS — M7672 Peroneal tendinitis, left leg: Secondary | ICD-10-CM | POA: Diagnosis not present

## 2022-03-02 ENCOUNTER — Encounter (HOSPITAL_BASED_OUTPATIENT_CLINIC_OR_DEPARTMENT_OTHER): Payer: Self-pay | Admitting: Urology

## 2022-03-02 ENCOUNTER — Other Ambulatory Visit: Payer: Self-pay

## 2022-03-02 ENCOUNTER — Emergency Department (HOSPITAL_BASED_OUTPATIENT_CLINIC_OR_DEPARTMENT_OTHER): Payer: PPO

## 2022-03-02 ENCOUNTER — Encounter (HOSPITAL_COMMUNITY): Payer: Self-pay

## 2022-03-02 ENCOUNTER — Inpatient Hospital Stay (HOSPITAL_BASED_OUTPATIENT_CLINIC_OR_DEPARTMENT_OTHER)
Admission: EM | Admit: 2022-03-02 | Discharge: 2022-03-15 | DRG: 981 | Disposition: A | Discharge status: Home / Self Care | Payer: PPO | Attending: Student | Admitting: Student

## 2022-03-02 DIAGNOSIS — Z9049 Acquired absence of other specified parts of digestive tract: Secondary | ICD-10-CM

## 2022-03-02 DIAGNOSIS — I959 Hypotension, unspecified: Secondary | ICD-10-CM | POA: Diagnosis not present

## 2022-03-02 DIAGNOSIS — Z79899 Other long term (current) drug therapy: Secondary | ICD-10-CM

## 2022-03-02 DIAGNOSIS — Z955 Presence of coronary angioplasty implant and graft: Secondary | ICD-10-CM

## 2022-03-02 DIAGNOSIS — E876 Hypokalemia: Secondary | ICD-10-CM | POA: Diagnosis not present

## 2022-03-02 DIAGNOSIS — R12 Heartburn: Secondary | ICD-10-CM | POA: Diagnosis not present

## 2022-03-02 DIAGNOSIS — Z7189 Other specified counseling: Secondary | ICD-10-CM

## 2022-03-02 DIAGNOSIS — J13 Pneumonia due to Streptococcus pneumoniae: Secondary | ICD-10-CM | POA: Diagnosis not present

## 2022-03-02 DIAGNOSIS — K219 Gastro-esophageal reflux disease without esophagitis: Secondary | ICD-10-CM | POA: Diagnosis present

## 2022-03-02 DIAGNOSIS — J9601 Acute respiratory failure with hypoxia: Principal | ICD-10-CM | POA: Diagnosis present

## 2022-03-02 DIAGNOSIS — J189 Pneumonia, unspecified organism: Secondary | ICD-10-CM | POA: Insufficient documentation

## 2022-03-02 DIAGNOSIS — Z7682 Awaiting organ transplant status: Secondary | ICD-10-CM

## 2022-03-02 DIAGNOSIS — Z886 Allergy status to analgesic agent status: Secondary | ICD-10-CM

## 2022-03-02 DIAGNOSIS — K5900 Constipation, unspecified: Secondary | ICD-10-CM | POA: Diagnosis not present

## 2022-03-02 DIAGNOSIS — N186 End stage renal disease: Secondary | ICD-10-CM | POA: Diagnosis present

## 2022-03-02 DIAGNOSIS — Z1152 Encounter for screening for COVID-19: Secondary | ICD-10-CM

## 2022-03-02 DIAGNOSIS — I77810 Thoracic aortic ectasia: Secondary | ICD-10-CM | POA: Diagnosis present

## 2022-03-02 DIAGNOSIS — R7989 Other specified abnormal findings of blood chemistry: Secondary | ICD-10-CM

## 2022-03-02 DIAGNOSIS — I10 Essential (primary) hypertension: Secondary | ICD-10-CM | POA: Diagnosis present

## 2022-03-02 DIAGNOSIS — G4700 Insomnia, unspecified: Secondary | ICD-10-CM | POA: Diagnosis not present

## 2022-03-02 DIAGNOSIS — Z515 Encounter for palliative care: Secondary | ICD-10-CM

## 2022-03-02 DIAGNOSIS — I472 Ventricular tachycardia, unspecified: Secondary | ICD-10-CM | POA: Diagnosis not present

## 2022-03-02 DIAGNOSIS — N400 Enlarged prostate without lower urinary tract symptoms: Secondary | ICD-10-CM | POA: Diagnosis present

## 2022-03-02 DIAGNOSIS — E8721 Acute metabolic acidosis: Secondary | ICD-10-CM | POA: Diagnosis present

## 2022-03-02 DIAGNOSIS — N250 Renal osteodystrophy: Secondary | ICD-10-CM | POA: Diagnosis present

## 2022-03-02 DIAGNOSIS — I5043 Acute on chronic combined systolic (congestive) and diastolic (congestive) heart failure: Secondary | ICD-10-CM | POA: Diagnosis not present

## 2022-03-02 DIAGNOSIS — I132 Hypertensive heart and chronic kidney disease with heart failure and with stage 5 chronic kidney disease, or end stage renal disease: Secondary | ICD-10-CM | POA: Diagnosis present

## 2022-03-02 DIAGNOSIS — Z992 Dependence on renal dialysis: Secondary | ICD-10-CM

## 2022-03-02 DIAGNOSIS — E871 Hypo-osmolality and hyponatremia: Secondary | ICD-10-CM | POA: Diagnosis present

## 2022-03-02 DIAGNOSIS — R531 Weakness: Secondary | ICD-10-CM | POA: Diagnosis present

## 2022-03-02 DIAGNOSIS — I471 Supraventricular tachycardia, unspecified: Secondary | ICD-10-CM | POA: Diagnosis not present

## 2022-03-02 DIAGNOSIS — F05 Delirium due to known physiological condition: Secondary | ICD-10-CM | POA: Diagnosis not present

## 2022-03-02 DIAGNOSIS — I493 Ventricular premature depolarization: Secondary | ICD-10-CM | POA: Diagnosis not present

## 2022-03-02 DIAGNOSIS — R410 Disorientation, unspecified: Secondary | ICD-10-CM | POA: Insufficient documentation

## 2022-03-02 DIAGNOSIS — Z87891 Personal history of nicotine dependence: Secondary | ICD-10-CM

## 2022-03-02 DIAGNOSIS — R109 Unspecified abdominal pain: Secondary | ICD-10-CM | POA: Diagnosis not present

## 2022-03-02 DIAGNOSIS — E038 Other specified hypothyroidism: Secondary | ICD-10-CM | POA: Diagnosis present

## 2022-03-02 DIAGNOSIS — I2510 Atherosclerotic heart disease of native coronary artery without angina pectoris: Secondary | ICD-10-CM | POA: Diagnosis present

## 2022-03-02 DIAGNOSIS — I429 Cardiomyopathy, unspecified: Secondary | ICD-10-CM | POA: Diagnosis not present

## 2022-03-02 DIAGNOSIS — Z888 Allergy status to other drugs, medicaments and biological substances status: Secondary | ICD-10-CM

## 2022-03-02 DIAGNOSIS — D539 Nutritional anemia, unspecified: Secondary | ICD-10-CM | POA: Insufficient documentation

## 2022-03-02 DIAGNOSIS — I080 Rheumatic disorders of both mitral and aortic valves: Secondary | ICD-10-CM | POA: Diagnosis present

## 2022-03-02 DIAGNOSIS — I21A1 Myocardial infarction type 2: Secondary | ICD-10-CM | POA: Diagnosis present

## 2022-03-02 DIAGNOSIS — D631 Anemia in chronic kidney disease: Secondary | ICD-10-CM | POA: Diagnosis present

## 2022-03-02 DIAGNOSIS — Z951 Presence of aortocoronary bypass graft: Secondary | ICD-10-CM

## 2022-03-02 DIAGNOSIS — E7800 Pure hypercholesterolemia, unspecified: Secondary | ICD-10-CM | POA: Diagnosis present

## 2022-03-02 DIAGNOSIS — I214 Non-ST elevation (NSTEMI) myocardial infarction: Secondary | ICD-10-CM

## 2022-03-02 DIAGNOSIS — N2581 Secondary hyperparathyroidism of renal origin: Secondary | ICD-10-CM | POA: Diagnosis present

## 2022-03-02 DIAGNOSIS — Z66 Do not resuscitate: Secondary | ICD-10-CM | POA: Diagnosis present

## 2022-03-02 DIAGNOSIS — Z833 Family history of diabetes mellitus: Secondary | ICD-10-CM

## 2022-03-02 HISTORY — DX: Dependence on renal dialysis: N18.6

## 2022-03-02 HISTORY — DX: End stage renal disease: Z99.2

## 2022-03-02 LAB — CBC WITH DIFFERENTIAL/PLATELET
Abs Immature Granulocytes: 0.07 10*3/uL (ref 0.00–0.07)
Basophils Absolute: 0.1 10*3/uL (ref 0.0–0.1)
Basophils Relative: 0 %
Eosinophils Absolute: 0.1 10*3/uL (ref 0.0–0.5)
Eosinophils Relative: 1 %
HCT: 29.2 % — ABNORMAL LOW (ref 39.0–52.0)
Hemoglobin: 10 g/dL — ABNORMAL LOW (ref 13.0–17.0)
Immature Granulocytes: 1 %
Lymphocytes Relative: 3 %
Lymphs Abs: 0.3 10*3/uL — ABNORMAL LOW (ref 0.7–4.0)
MCH: 33.6 pg (ref 26.0–34.0)
MCHC: 34.2 g/dL (ref 30.0–36.0)
MCV: 98 fL (ref 80.0–100.0)
Monocytes Absolute: 0.8 10*3/uL (ref 0.1–1.0)
Monocytes Relative: 7 %
Neutro Abs: 9.8 10*3/uL — ABNORMAL HIGH (ref 1.7–7.7)
Neutrophils Relative %: 88 %
Platelets: 323 10*3/uL (ref 150–400)
RBC: 2.98 MIL/uL — ABNORMAL LOW (ref 4.22–5.81)
RDW: 11.8 % (ref 11.5–15.5)
WBC: 11.2 10*3/uL — ABNORMAL HIGH (ref 4.0–10.5)
nRBC: 0 % (ref 0.0–0.2)

## 2022-03-02 LAB — COMPREHENSIVE METABOLIC PANEL
ALT: 18 U/L (ref 0–44)
AST: 24 U/L (ref 15–41)
Albumin: 3.2 g/dL — ABNORMAL LOW (ref 3.5–5.0)
Alkaline Phosphatase: 52 U/L (ref 38–126)
Anion gap: 13 (ref 5–15)
BUN: 59 mg/dL — ABNORMAL HIGH (ref 8–23)
CO2: 20 mmol/L — ABNORMAL LOW (ref 22–32)
Calcium: 8.4 mg/dL — ABNORMAL LOW (ref 8.9–10.3)
Chloride: 96 mmol/L — ABNORMAL LOW (ref 98–111)
Creatinine, Ser: 6.08 mg/dL — ABNORMAL HIGH (ref 0.61–1.24)
GFR, Estimated: 9 mL/min — ABNORMAL LOW (ref >60)
Glucose, Bld: 120 mg/dL — ABNORMAL HIGH (ref 70–99)
Potassium: 4.3 mmol/L (ref 3.5–5.1)
Sodium: 129 mmol/L — ABNORMAL LOW (ref 135–145)
Total Bilirubin: 0.3 mg/dL (ref 0.3–1.2)
Total Protein: 7.1 g/dL (ref 6.5–8.1)

## 2022-03-02 LAB — RESP PANEL BY RT-PCR (RSV, FLU A&B, COVID)  RVPGX2
Influenza A by PCR: NEGATIVE
Influenza B by PCR: NEGATIVE
Resp Syncytial Virus by PCR: NEGATIVE
SARS Coronavirus 2 by RT PCR: NEGATIVE

## 2022-03-02 LAB — BRAIN NATRIURETIC PEPTIDE: B Natriuretic Peptide: 1301.2 pg/mL — ABNORMAL HIGH (ref 0.0–100.0)

## 2022-03-02 LAB — LIPASE, BLOOD: Lipase: 26 U/L (ref 11–51)

## 2022-03-02 LAB — TROPONIN I (HIGH SENSITIVITY): Troponin I (High Sensitivity): 690 ng/L (ref <18)

## 2022-03-02 MED ORDER — SODIUM CHLORIDE 0.9 % IV SOLN
500.0000 mg | Freq: Once | INTRAVENOUS | Status: AC
  Administered 2022-03-02: 500 mg via INTRAVENOUS
  Filled 2022-03-02: qty 5

## 2022-03-02 MED ORDER — ONDANSETRON HCL 4 MG/2ML IJ SOLN
4.0000 mg | Freq: Once | INTRAMUSCULAR | Status: AC
  Administered 2022-03-02: 4 mg via INTRAVENOUS
  Filled 2022-03-02: qty 2

## 2022-03-02 MED ORDER — SODIUM CHLORIDE 0.9 % IV SOLN
1.0000 g | Freq: Once | INTRAVENOUS | Status: AC
  Administered 2022-03-02: 1 g via INTRAVENOUS
  Filled 2022-03-02: qty 10

## 2022-03-02 NOTE — Progress Notes (Signed)
Patient's SPO2 was 88% on 4 liter nasal cannula.  Placed patient on 10 liter Hi-Flo nasal cannula.  Patient's SPO2 is now 94%.  RT will continue to monitor.

## 2022-03-02 NOTE — ED Triage Notes (Signed)
Per pt family has been vomiting x 2 days  SOB that started yesterday with exertion  Unable to tolerating po intake   Denies pain at this time   H/o Peritoneal Dialysis

## 2022-03-02 NOTE — ED Notes (Signed)
Rounded on pt and adjusted cardiac monitor leads. Pt appears to be comfortable; O2 sat steadily at 89%. Bumped O2 from 2 to 3L via [facility].

## 2022-03-02 NOTE — ED Provider Notes (Signed)
[facility] Provider Note   CSN: [id number] Arrival date & time: 03/02/22  2038     History  Chief Complaint  Patient presents with   Emesis   Shortness of Breath    William [name] is a 81 y.o. male.  Patient here with shortness of breath, cough, nausea.  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  History of high cholesterol, aortic insufficiency, hypertension.  He has not felt well the last couple days.  Has had cough and sputum production.  Denies any chest pain.  He is felt short of breath.  He had a lot of decreased energy.  He has been doing his dialysis at home.  Nothing makes it worse or better.  Denies any sick contacts.  Maybe has had some fevers.  The history is provided by the patient.       Home Medications Prior to Admission medications   Medication Sig Start Date End Date Taking? Authorizing Provider  amLODipine (NORVASC) 10 MG tablet TAKE ONE TABLET BY MOUTH ONCE DAILY 08/08/15   [provider]  doxazosin (CARDURA) 2 MG tablet Take 2 mg by mouth 2 (two) times daily. 05/04/19   [provider]  losartan (COZAAR) 50 MG tablet Take 50 mg by mouth daily. Reported on 02/16/2015    [provider]  omeprazole (PRILOSEC) 20 MG capsule Take 20 mg by mouth daily. Reported on 02/16/2015    [provider]  sodium bicarbonate 1 mEq/mL SOLN Take by mouth.    [provider]  torsemide (DEMADEX) 20 MG tablet Take by mouth. 07/18/17   [provider]      Allergies    Nsaids, Valacyclovir, and Lisinopril    Review of Systems   Review of Systems  Physical Exam Updated Vital Signs BP (!) 141/79   Pulse 87   Temp 97.7 F (36.5 C) (Oral)   Resp (!) 24   Ht 6' (1.829 m)   Wt 83.9 kg   SpO2 92%   BMI 25.09 kg/m  Physical Exam Vitals and nursing note reviewed.  Constitutional:      General: He is not in acute distress.    Appearance: He is well-developed. He is  ill-appearing.  HENT:     Head: Normocephalic and atraumatic.  Eyes:     Extraocular Movements: Extraocular movements intact.     Conjunctiva/sclera: Conjunctivae normal.     Pupils: Pupils are equal, round, and reactive to light.  Cardiovascular:     Rate and Rhythm: Normal rate and regular rhythm.     Pulses: Normal pulses.     Heart sounds: Normal heart sounds. No murmur heard. Pulmonary:     Effort: Pulmonary effort is normal. No respiratory distress.     Breath sounds: Decreased breath sounds present. No wheezing, rhonchi or rales.  Abdominal:     Palpations: Abdomen is soft.     Tenderness: There is no abdominal tenderness.  Musculoskeletal:        General: No swelling.     Cervical back: Normal range of motion and neck supple.     Right lower leg: Edema present.     Left lower leg: Edema present.  Skin:    General: Skin is warm and dry.     Capillary Refill: Capillary refill takes less than 2 seconds.  Neurological:     General: No focal deficit present.     Mental Status: He is alert.  Psychiatric:  Mood and Affect: Mood normal.     ED Results / Procedures / Treatments   Labs (all labs ordered are listed, but only abnormal results are displayed) Labs Reviewed  CBC WITH DIFFERENTIAL/PLATELET - Abnormal; Notable for the following components:      Result Value   WBC 11.2 (*)    RBC 2.98 (*)    Hemoglobin 10.0 (*)    HCT 29.2 (*)    Neutro Abs 9.8 (*)    Lymphs Abs 0.3 (*)    All other components within normal limits  COMPREHENSIVE METABOLIC PANEL - Abnormal; Notable for the following components:   Sodium 129 (*)    Chloride 96 (*)    CO2 20 (*)    Glucose, Bld 120 (*)    BUN 59 (*)    Creatinine, Ser 6.08 (*)    Calcium 8.4 (*)    Albumin 3.2 (*)    GFR, Estimated 9 (*)    All other components within normal limits  BRAIN NATRIURETIC PEPTIDE - Abnormal; Notable for the following components:   B Natriuretic Peptide 1,301.2 (*)    All other  components within normal limits  TROPONIN I (HIGH SENSITIVITY) - Abnormal; Notable for the following components:   Troponin I (High Sensitivity) 690 (*)    All other components within normal limits  RESP PANEL BY RT-PCR (RSV, FLU A&B, COVID)  RVPGX2  CULTURE, BLOOD (ROUTINE X 2)  CULTURE, BLOOD (ROUTINE X 2)  LIPASE, BLOOD  TROPONIN I (HIGH SENSITIVITY)    EKG EKG Interpretation  Date/Time:  Friday March 02 2022 21:16:09 EST Ventricular Rate:  95 PR Interval:  175 QRS Duration: 102 QT Interval:  382 QTC Calculation: 481 R Axis:   92 Text Interpretation: Sinus rhythm Right axis deviation Confirmed by Lennice Sites ([id number]) on 03/02/2022 9:39:25 PM  Radiology DG Chest 2 View  Result Date: 03/02/2022 CLINICAL DATA:  Dyspnea EXAM: CHEST - 2 VIEW COMPARISON:  03/11/2021 FINDINGS: There is consolidation within the right upper lobe in keeping with changes of acute lobar pneumonia in the appropriate clinical setting. Left lung is clear. No pneumothorax or pleural effusion. Cardiac size within normal limits. No acute bone abnormality. IMPRESSION: 1. Right upper lobe pneumonia. Follow-up chest radiograph is recommended in 3-4 weeks to document resolution. Electronically Signed   By: Fidela Salisbury M.D.   On: 03/02/2022 22:00    Procedures .Critical Care  Performed by: Lennice Sites, DO Authorized by: Lennice Sites, DO   Critical care provider statement:    Critical care time (minutes):  35   Critical care was necessary to treat or prevent imminent or life-threatening deterioration of the following conditions:  Respiratory failure   Critical care was time spent personally by me on the following activities:  Blood draw for specimens, development of treatment plan with patient or surrogate, discussions with primary provider, discussions with consultants, evaluation of patient's response to treatment, examination of patient, obtaining history from patient or surrogate, ordering and  performing treatments and interventions, ordering and review of laboratory studies, ordering and review of radiographic studies, pulse oximetry and re-evaluation of patient's condition   I assumed direction of critical care for this patient from another provider in my specialty: no       Medications Ordered in ED Medications  azithromycin (ZITHROMAX) 500 mg in sodium chloride 0.9 % 250 mL IVPB (500 mg Intravenous New Bag/Given 03/02/22 2345)  ondansetron (ZOFRAN) injection 4 mg (4 mg Intravenous Given 03/02/22 2146)  cefTRIAXone (ROCEPHIN) 1 g  in sodium chloride 0.9 % 100 mL IVPB (0 g Intravenous Stopped 03/02/22 2345)    ED Course/ Medical Decision Making/ A&P                           Medical Decision Making Amount and/or Complexity of Data Reviewed Labs: ordered. Radiology: ordered.  Risk Prescription drug management. Decision regarding hospitalization.   Jodey Burbano is here with nausea, vomiting, shortness of breath, cough.  Patient with history of CKD on peritoneal dialysis, hypertension, high cholesterol.  EKG shows sinus rhythm.  Some ST depressions inferiorly but otherwise unremarkable EKG.  Patient not feeling well the last few days.  Some viral symptoms with cough and nausea and vomiting.  No abdominal pain.  Differential includes viral process versus pneumonia versus possibly cardiac process including ACS or volume overload.  Patient has unremarkable vitals except for he is hypoxic in the upper 80s.  Placed on 2 L of oxygen.  Workup pursued including CBC, CMP, troponin, BNP, viral swab, chest x-ray.  Per my review and interpretation of labs he is negative for COVID and flu.  Chest x-ray consistent with pneumonia.  BNP is 1300, troponin is 690.  He has mild leukocytosis.  Otherwise no significant electrolyte abnormality.  Creatinine is 6.  Last creatinine I can see in the system was about a year or so ago that was almost 5.  Overall I suspect that he has pneumonia with may be  some secondary demand.  Will continue to trend troponin.  I talked with cardiology on the phone and at this time would continue to trend troponin.  He had a stress test in 2021 that did not show any signs of ischemia.  He has no history of CAD.  I have low suspicion for PE given pneumonia on chest x-ray.  Will admit to hospitalist for further care.  This chart was dictated using voice recognition software.  Despite best efforts to proofread,  errors can occur which can change the documentation meaning.         Final Clinical Impression(s) / ED Diagnoses Final diagnoses:  Acute respiratory failure with hypoxia ([place])  Community acquired pneumonia, unspecified laterality  Elevated troponin    Rx / DC Orders ED Discharge Orders     None         Lennice Sites, DO 03/02/22 2358

## 2022-03-02 NOTE — ED Notes (Signed)
Increased O2 from 3 to 4L [place]. Pt still hypoxic.

## 2022-03-03 ENCOUNTER — Inpatient Hospital Stay (HOSPITAL_COMMUNITY): Payer: PPO

## 2022-03-03 ENCOUNTER — Encounter (HOSPITAL_COMMUNITY): Payer: Self-pay | Admitting: Internal Medicine

## 2022-03-03 DIAGNOSIS — D539 Nutritional anemia, unspecified: Secondary | ICD-10-CM | POA: Insufficient documentation

## 2022-03-03 DIAGNOSIS — E785 Hyperlipidemia, unspecified: Secondary | ICD-10-CM | POA: Diagnosis not present

## 2022-03-03 DIAGNOSIS — F05 Delirium due to known physiological condition: Secondary | ICD-10-CM | POA: Diagnosis not present

## 2022-03-03 DIAGNOSIS — J9601 Acute respiratory failure with hypoxia: Principal | ICD-10-CM | POA: Diagnosis present

## 2022-03-03 DIAGNOSIS — E8721 Acute metabolic acidosis: Secondary | ICD-10-CM | POA: Insufficient documentation

## 2022-03-03 DIAGNOSIS — I255 Ischemic cardiomyopathy: Secondary | ICD-10-CM | POA: Diagnosis not present

## 2022-03-03 DIAGNOSIS — Z66 Do not resuscitate: Secondary | ICD-10-CM | POA: Diagnosis present

## 2022-03-03 DIAGNOSIS — N2581 Secondary hyperparathyroidism of renal origin: Secondary | ICD-10-CM | POA: Diagnosis present

## 2022-03-03 DIAGNOSIS — I214 Non-ST elevation (NSTEMI) myocardial infarction: Secondary | ICD-10-CM | POA: Diagnosis not present

## 2022-03-03 DIAGNOSIS — I5020 Unspecified systolic (congestive) heart failure: Secondary | ICD-10-CM | POA: Diagnosis not present

## 2022-03-03 DIAGNOSIS — I080 Rheumatic disorders of both mitral and aortic valves: Secondary | ICD-10-CM | POA: Diagnosis present

## 2022-03-03 DIAGNOSIS — I471 Supraventricular tachycardia, unspecified: Secondary | ICD-10-CM | POA: Diagnosis not present

## 2022-03-03 DIAGNOSIS — I2510 Atherosclerotic heart disease of native coronary artery without angina pectoris: Secondary | ICD-10-CM | POA: Diagnosis not present

## 2022-03-03 DIAGNOSIS — Z7682 Awaiting organ transplant status: Secondary | ICD-10-CM | POA: Diagnosis not present

## 2022-03-03 DIAGNOSIS — E871 Hypo-osmolality and hyponatremia: Secondary | ICD-10-CM | POA: Insufficient documentation

## 2022-03-03 DIAGNOSIS — D631 Anemia in chronic kidney disease: Secondary | ICD-10-CM | POA: Insufficient documentation

## 2022-03-03 DIAGNOSIS — R9431 Abnormal electrocardiogram [ECG] [EKG]: Secondary | ICD-10-CM | POA: Diagnosis not present

## 2022-03-03 DIAGNOSIS — I77810 Thoracic aortic ectasia: Secondary | ICD-10-CM | POA: Diagnosis present

## 2022-03-03 DIAGNOSIS — E038 Other specified hypothyroidism: Secondary | ICD-10-CM | POA: Diagnosis present

## 2022-03-03 DIAGNOSIS — Z1152 Encounter for screening for COVID-19: Secondary | ICD-10-CM | POA: Diagnosis not present

## 2022-03-03 DIAGNOSIS — N186 End stage renal disease: Secondary | ICD-10-CM | POA: Diagnosis present

## 2022-03-03 DIAGNOSIS — Z7189 Other specified counseling: Secondary | ICD-10-CM | POA: Diagnosis not present

## 2022-03-03 DIAGNOSIS — R410 Disorientation, unspecified: Secondary | ICD-10-CM | POA: Diagnosis not present

## 2022-03-03 DIAGNOSIS — I21A1 Myocardial infarction type 2: Secondary | ICD-10-CM | POA: Diagnosis present

## 2022-03-03 DIAGNOSIS — J189 Pneumonia, unspecified organism: Secondary | ICD-10-CM | POA: Insufficient documentation

## 2022-03-03 DIAGNOSIS — D638 Anemia in other chronic diseases classified elsewhere: Secondary | ICD-10-CM | POA: Diagnosis not present

## 2022-03-03 DIAGNOSIS — E7849 Other hyperlipidemia: Secondary | ICD-10-CM | POA: Diagnosis not present

## 2022-03-03 DIAGNOSIS — J13 Pneumonia due to Streptococcus pneumoniae: Secondary | ICD-10-CM | POA: Diagnosis present

## 2022-03-03 DIAGNOSIS — Z515 Encounter for palliative care: Secondary | ICD-10-CM | POA: Diagnosis not present

## 2022-03-03 DIAGNOSIS — I429 Cardiomyopathy, unspecified: Secondary | ICD-10-CM | POA: Diagnosis not present

## 2022-03-03 DIAGNOSIS — I959 Hypotension, unspecified: Secondary | ICD-10-CM | POA: Diagnosis not present

## 2022-03-03 DIAGNOSIS — I132 Hypertensive heart and chronic kidney disease with heart failure and with stage 5 chronic kidney disease, or end stage renal disease: Secondary | ICD-10-CM | POA: Diagnosis present

## 2022-03-03 DIAGNOSIS — Z992 Dependence on renal dialysis: Secondary | ICD-10-CM | POA: Diagnosis not present

## 2022-03-03 DIAGNOSIS — I5043 Acute on chronic combined systolic (congestive) and diastolic (congestive) heart failure: Secondary | ICD-10-CM | POA: Diagnosis not present

## 2022-03-03 LAB — RESPIRATORY PANEL BY PCR

## 2022-03-03 LAB — MRSA NEXT GEN BY PCR, NASAL: MRSA by PCR Next Gen: NOT DETECTED

## 2022-03-03 LAB — POCT I-STAT 7, (LYTES, BLD GAS, ICA,H+H)
Acid-base deficit: 9 mmol/L — ABNORMAL HIGH (ref 0.0–2.0)
Bicarbonate: 16.3 mmol/L — ABNORMAL LOW (ref 20.0–28.0)
Calcium, Ion: 1.11 mmol/L — ABNORMAL LOW (ref 1.15–1.40)
HCT: 24 % — ABNORMAL LOW (ref 39.0–52.0)
Hemoglobin: 8.2 g/dL — ABNORMAL LOW (ref 13.0–17.0)
O2 Saturation: 97 %
Patient temperature: 98.6
Potassium: 5.2 mmol/L — ABNORMAL HIGH (ref 3.5–5.1)
Sodium: 127 mmol/L — ABNORMAL LOW (ref 135–145)
TCO2: 17 mmol/L — ABNORMAL LOW (ref 22–32)
pCO2 arterial: 33 mmHg (ref 32–48)
pH, Arterial: 7.302 — ABNORMAL LOW (ref 7.35–7.45)
pO2, Arterial: 104 mmHg (ref 83–108)

## 2022-03-03 LAB — I-STAT ARTERIAL BLOOD GAS, ED
Acid-base deficit: 9 mmol/L — ABNORMAL HIGH (ref 0.0–2.0)
Bicarbonate: 16.7 mmol/L — ABNORMAL LOW (ref 20.0–28.0)
Calcium, Ion: 1.15 mmol/L (ref 1.15–1.40)
HCT: 28 % — ABNORMAL LOW (ref 39.0–52.0)
Hemoglobin: 9.5 g/dL — ABNORMAL LOW (ref 13.0–17.0)
O2 Saturation: 90 %
Patient temperature: 97.9
Potassium: 4.3 mmol/L (ref 3.5–5.1)
Sodium: 130 mmol/L — ABNORMAL LOW (ref 135–145)
TCO2: 18 mmol/L — ABNORMAL LOW (ref 22–32)
pCO2 arterial: 32.7 mmHg (ref 32–48)
pH, Arterial: 7.316 — ABNORMAL LOW (ref 7.35–7.45)
pO2, Arterial: 61 mmHg — ABNORMAL LOW (ref 83–108)

## 2022-03-03 LAB — STREP PNEUMONIAE URINARY ANTIGEN: Strep Pneumo Urinary Antigen: NEGATIVE

## 2022-03-03 LAB — PROCALCITONIN: Procalcitonin: 0.38 ng/mL

## 2022-03-03 LAB — TROPONIN I (HIGH SENSITIVITY)
Troponin I (High Sensitivity): 582 ng/L (ref <18)
Troponin I (High Sensitivity): 825 ng/L (ref <18)

## 2022-03-03 MED ORDER — ETOMIDATE 2 MG/ML IV SOLN: 20.0000 mg | Freq: Once | INTRAVENOUS | Status: AC

## 2022-03-03 MED ORDER — MIDAZOLAM HCL 2 MG/2ML IJ SOLN
2.0000 mg | Freq: Once | INTRAMUSCULAR | Status: AC
  Administered 2022-03-03: 2 mg via INTRAVENOUS

## 2022-03-03 MED ORDER — ACETAMINOPHEN 650 MG RE SUPP: 650.0000 mg | Freq: Four times a day (QID) | RECTAL | Status: DC | PRN

## 2022-03-03 MED ORDER — ETOMIDATE 2 MG/ML IV SOLN
INTRAVENOUS | Status: AC
  Administered 2022-03-03: 20 mg via INTRAVENOUS
  Filled 2022-03-03: qty 10

## 2022-03-03 MED ORDER — HEPARIN BOLUS VIA INFUSION
2000.0000 [IU] | Freq: Once | INTRAVENOUS | Status: DC
  Filled 2022-03-03: qty 2000

## 2022-03-03 MED ORDER — NOREPINEPHRINE 4 MG/250ML-% IV SOLN
0.0000 ug/min | INTRAVENOUS | Status: DC
  Administered 2022-03-03 – 2022-03-04 (×2): 5 ug/min via INTRAVENOUS
  Administered 2022-03-04: 7 ug/min via INTRAVENOUS
  Filled 2022-03-03 (×2): qty 250

## 2022-03-03 MED ORDER — FENTANYL CITRATE PF 50 MCG/ML IJ SOSY: 100.0000 ug | PREFILLED_SYRINGE | Freq: Once | INTRAMUSCULAR | Status: AC

## 2022-03-03 MED ORDER — FUROSEMIDE 10 MG/ML IJ SOLN
120.0000 mg | Freq: Once | INTRAVENOUS | Status: AC
  Administered 2022-03-03: 120 mg via INTRAVENOUS
  Filled 2022-03-03: qty 10

## 2022-03-03 MED ORDER — GENTAMICIN SULFATE 0.1 % EX CREA
1.0000 | TOPICAL_CREAM | Freq: Every day | CUTANEOUS | Status: DC
  Administered 2022-03-03 – 2022-03-08 (×7): 1 via TOPICAL
  Filled 2022-03-03 (×2): qty 15

## 2022-03-03 MED ORDER — FENTANYL CITRATE PF 50 MCG/ML IJ SOSY
PREFILLED_SYRINGE | INTRAMUSCULAR | Status: AC
  Administered 2022-03-03: 100 ug via INTRAVENOUS
  Filled 2022-03-03: qty 2

## 2022-03-03 MED ORDER — ROCURONIUM BROMIDE 50 MG/5ML IV SOLN: 80.0000 mg | Freq: Once | INTRAVENOUS | Status: AC

## 2022-03-03 MED ORDER — DOCUSATE SODIUM 50 MG/5ML PO LIQD
100.0000 mg | Freq: Two times a day (BID) | ORAL | Status: DC
  Administered 2022-03-03 – 2022-03-05 (×4): 100 mg
  Filled 2022-03-03 (×4): qty 10

## 2022-03-03 MED ORDER — ROCURONIUM BROMIDE 10 MG/ML (PF) SYRINGE
PREFILLED_SYRINGE | INTRAVENOUS | Status: AC
  Administered 2022-03-03: 80 mg via INTRAVENOUS
  Filled 2022-03-03: qty 10

## 2022-03-03 MED ORDER — FENTANYL CITRATE PF 50 MCG/ML IJ SOSY
PREFILLED_SYRINGE | INTRAMUSCULAR | Status: AC
  Filled 2022-03-03: qty 2

## 2022-03-03 MED ORDER — NOREPINEPHRINE 4 MG/250ML-% IV SOLN
INTRAVENOUS | Status: AC
  Administered 2022-03-03: 5 ug/min via INTRAVENOUS
  Filled 2022-03-03: qty 250

## 2022-03-03 MED ORDER — ASPIRIN 325 MG PO TABS
325.0000 mg | ORAL_TABLET | Freq: Once | ORAL | Status: AC
  Administered 2022-03-03: 325 mg
  Filled 2022-03-03: qty 1

## 2022-03-03 MED ORDER — HEPARIN BOLUS VIA INFUSION
4000.0000 [IU] | Freq: Once | INTRAVENOUS | Status: AC
  Administered 2022-03-03: 4000 [IU] via INTRAVENOUS
  Filled 2022-03-03: qty 4000

## 2022-03-03 MED ORDER — MIDAZOLAM HCL 2 MG/2ML IJ SOLN
INTRAMUSCULAR | Status: AC
  Filled 2022-03-03: qty 2

## 2022-03-03 MED ORDER — ENOXAPARIN SODIUM 30 MG/0.3ML IJ SOSY: 30.0000 mg | PREFILLED_SYRINGE | INTRAMUSCULAR | Status: DC

## 2022-03-03 MED ORDER — PANTOPRAZOLE SODIUM 40 MG PO TBEC
40.0000 mg | DELAYED_RELEASE_TABLET | Freq: Every day | ORAL | Status: DC
  Administered 2022-03-03 – 2022-03-04 (×2): 40 mg via ORAL
  Filled 2022-03-03 (×3): qty 1

## 2022-03-03 MED ORDER — IPRATROPIUM-ALBUTEROL 0.5-2.5 (3) MG/3ML IN SOLN
RESPIRATORY_TRACT | Status: AC
  Administered 2022-03-03: 3 mL via RESPIRATORY_TRACT
  Filled 2022-03-03: qty 3

## 2022-03-03 MED ORDER — SODIUM CHLORIDE 0.9 % IV SOLN: INTRAVENOUS | Status: DC | PRN

## 2022-03-03 MED ORDER — MIDAZOLAM HCL 2 MG/2ML IJ SOLN
INTRAMUSCULAR | Status: AC
  Administered 2022-03-03: 2 mg via INTRAVENOUS
  Filled 2022-03-03: qty 4

## 2022-03-03 MED ORDER — HEPARIN SODIUM (PORCINE) 5000 UNIT/ML IJ SOLN: 5000.0000 [IU] | Freq: Three times a day (TID) | INTRAMUSCULAR | Status: DC

## 2022-03-03 MED ORDER — ONDANSETRON HCL 4 MG PO TABS: 4.0000 mg | ORAL_TABLET | Freq: Four times a day (QID) | ORAL | Status: DC | PRN

## 2022-03-03 MED ORDER — METHYLPREDNISOLONE SODIUM SUCC 125 MG IJ SOLR
125.0000 mg | Freq: Every day | INTRAMUSCULAR | Status: DC
  Administered 2022-03-03 – 2022-03-04 (×2): 125 mg via INTRAVENOUS
  Filled 2022-03-03 (×2): qty 2

## 2022-03-03 MED ORDER — DELFLEX-LC/4.25% DEXTROSE 483 MOSM/L IP SOLN: INTRAPERITONEAL | Status: DC

## 2022-03-03 MED ORDER — POLYETHYLENE GLYCOL 3350 17 G PO PACK
17.0000 g | PACK | Freq: Every day | ORAL | Status: DC
  Administered 2022-03-03 – 2022-03-05 (×3): 17 g
  Filled 2022-03-03 (×4): qty 1

## 2022-03-03 MED ORDER — VANCOMYCIN VARIABLE DOSE PER UNSTABLE RENAL FUNCTION (PHARMACIST DOSING): Status: DC

## 2022-03-03 MED ORDER — ETOMIDATE 2 MG/ML IV SOLN
INTRAVENOUS | Status: AC
  Filled 2022-03-03: qty 20

## 2022-03-03 MED ORDER — ACETAMINOPHEN 325 MG PO TABS
650.0000 mg | ORAL_TABLET | Freq: Four times a day (QID) | ORAL | Status: DC | PRN
  Administered 2022-03-07 – 2022-03-14 (×3): 650 mg via ORAL
  Filled 2022-03-03 (×3): qty 2

## 2022-03-03 MED ORDER — MIDAZOLAM HCL 2 MG/2ML IJ SOLN: 2.0000 mg | Freq: Once | INTRAMUSCULAR | Status: AC

## 2022-03-03 MED ORDER — DEXMEDETOMIDINE HCL IN NACL 400 MCG/100ML IV SOLN
0.0000 ug/kg/h | INTRAVENOUS | Status: DC
  Administered 2022-03-03: 0.4 ug/kg/h via INTRAVENOUS
  Administered 2022-03-04: 0.6 ug/kg/h via INTRAVENOUS
  Filled 2022-03-03 (×2): qty 100

## 2022-03-03 MED ORDER — ORAL CARE MOUTH RINSE
15.0000 mL | OROMUCOSAL | Status: DC
  Administered 2022-03-03 – 2022-03-05 (×19): 15 mL via OROMUCOSAL

## 2022-03-03 MED ORDER — ONDANSETRON HCL 4 MG/2ML IJ SOLN
4.0000 mg | Freq: Four times a day (QID) | INTRAMUSCULAR | Status: DC | PRN
  Administered 2022-03-06 – 2022-03-08 (×2): 4 mg via INTRAVENOUS
  Filled 2022-03-03 (×3): qty 2

## 2022-03-03 MED ORDER — ONDANSETRON HCL 4 MG/2ML IJ SOLN
4.0000 mg | Freq: Once | INTRAMUSCULAR | Status: AC
  Administered 2022-03-03: 4 mg via INTRAVENOUS
  Filled 2022-03-03: qty 2

## 2022-03-03 MED ORDER — SUCCINYLCHOLINE CHLORIDE 200 MG/10ML IV SOSY
PREFILLED_SYRINGE | INTRAVENOUS | Status: AC
  Filled 2022-03-03: qty 10

## 2022-03-03 MED ORDER — ORAL CARE MOUTH RINSE: 15.0000 mL | OROMUCOSAL | Status: DC | PRN

## 2022-03-03 MED ORDER — ROCURONIUM BROMIDE 10 MG/ML (PF) SYRINGE
PREFILLED_SYRINGE | INTRAVENOUS | Status: AC
  Filled 2022-03-03: qty 10

## 2022-03-03 MED ORDER — SODIUM CHLORIDE 0.9% FLUSH: 10.0000 mL | INTRAVENOUS | Status: DC | PRN

## 2022-03-03 MED ORDER — VANCOMYCIN HCL 2000 MG/400ML IV SOLN
2000.0000 mg | INTRAVENOUS | Status: AC
  Administered 2022-03-03: 2000 mg via INTRAVENOUS
  Filled 2022-03-03: qty 400

## 2022-03-03 MED ORDER — CHLORHEXIDINE GLUCONATE CLOTH 2 % EX PADS
6.0000 | MEDICATED_PAD | Freq: Every day | CUTANEOUS | Status: DC
  Administered 2022-03-03 – 2022-03-15 (×11): 6 via TOPICAL

## 2022-03-03 MED ORDER — FENTANYL 2500MCG IN NS 250ML (10MCG/ML) PREMIX INFUSION
25.0000 ug/h | INTRAVENOUS | Status: DC
  Administered 2022-03-03: 100 ug/h via INTRAVENOUS
  Administered 2022-03-04 – 2022-03-05 (×3): 200 ug/h via INTRAVENOUS
  Filled 2022-03-03 (×4): qty 250

## 2022-03-03 MED ORDER — SODIUM CHLORIDE 0.9% FLUSH
10.0000 mL | Freq: Two times a day (BID) | INTRAVENOUS | Status: DC
  Administered 2022-03-03 – 2022-03-04 (×2): 10 mL
  Administered 2022-03-05: 30 mL
  Administered 2022-03-06 – 2022-03-07 (×4): 10 mL
  Administered 2022-03-08: 20 mL
  Administered 2022-03-08 – 2022-03-11 (×6): 10 mL

## 2022-03-03 MED ORDER — SODIUM CHLORIDE 0.9 % IV SOLN
500.0000 mg | INTRAVENOUS | Status: AC
  Administered 2022-03-03 – 2022-03-06 (×4): 500 mg via INTRAVENOUS
  Filled 2022-03-03 (×5): qty 5

## 2022-03-03 MED ORDER — HEPARIN (PORCINE) 25000 UT/250ML-% IV SOLN
1300.0000 [IU]/h | INTRAVENOUS | Status: DC
  Administered 2022-03-03: 1150 [IU]/h via INTRAVENOUS
  Administered 2022-03-04: 1300 [IU]/h via INTRAVENOUS
  Filled 2022-03-03 (×2): qty 250

## 2022-03-03 MED ORDER — KETAMINE HCL 50 MG/5ML IJ SOSY
PREFILLED_SYRINGE | INTRAMUSCULAR | Status: AC
  Filled 2022-03-03: qty 10

## 2022-03-03 MED ORDER — FENTANYL BOLUS VIA INFUSION: 25.0000 ug | INTRAVENOUS | Status: DC | PRN

## 2022-03-03 MED ORDER — IPRATROPIUM-ALBUTEROL 0.5-2.5 (3) MG/3ML IN SOLN: 3.0000 mL | Freq: Once | RESPIRATORY_TRACT | Status: AC

## 2022-03-03 MED ORDER — SODIUM CHLORIDE 0.9 % IN NEBU: 3.0000 mL | INHALATION_SOLUTION | Freq: Three times a day (TID) | RESPIRATORY_TRACT | Status: DC | PRN

## 2022-03-03 MED ORDER — FENTANYL CITRATE PF 50 MCG/ML IJ SOSY
50.0000 ug | PREFILLED_SYRINGE | Freq: Once | INTRAMUSCULAR | Status: AC
  Administered 2022-03-03: 50 ug via INTRAVENOUS
  Filled 2022-03-03: qty 1

## 2022-03-03 MED ORDER — SODIUM CHLORIDE 0.9 % IV SOLN
2.0000 g | INTRAVENOUS | Status: AC
  Administered 2022-03-03 – 2022-03-09 (×7): 2 g via INTRAVENOUS
  Filled 2022-03-03 (×7): qty 20

## 2022-03-03 MED ORDER — FENTANYL CITRATE PF 50 MCG/ML IJ SOSY: 25.0000 ug | PREFILLED_SYRINGE | Freq: Once | INTRAMUSCULAR | Status: DC

## 2022-03-03 MED ORDER — FAMOTIDINE 20 MG PO TABS
20.0000 mg | ORAL_TABLET | Freq: Every day | ORAL | Status: DC
  Administered 2022-03-03 – 2022-03-06 (×4): 20 mg
  Filled 2022-03-03 (×5): qty 1

## 2022-03-03 NOTE — Progress Notes (Addendum)
Pt arrived to unit. Pt is SOB and currently on BiPAP at 88-91% oxygen saturation. Respiratory at bedside.  Paged admissions to have attending assigned.  Pt placed on cardiac monitoring.  Pt requesting to have peritoneal dialysis done today since he already missed one.

## 2022-03-03 NOTE — Procedures (Signed)
Central Venous Catheter Insertion Procedure Note  Estel Scholze  [id number]  1940-06-09  Date:03/03/22  Time:6:18 PM   Provider Performing:Rosita Guzzetta Loletha Grayer Tamala Julian   Procedure: Insertion of Non-tunneled Central Venous Catheter(36556) with US guidance (83291)   Indication(s) Difficult access  Consent Unable to obtain consent due to emergent nature of procedure.  Anesthesia Topical only with 1% lidocaine   Timeout Verified patient identification, verified procedure, site/side was marked, verified correct patient position, special equipment/implants available, medications/allergies/relevant history reviewed, required imaging and test results available.  Sterile Technique Maximal sterile technique including full sterile barrier drape, hand hygiene, sterile gown, sterile gloves, mask, hair covering, sterile ultrasound probe cover (if used).  Procedure Description Area of catheter insertion was cleaned with chlorhexidine and draped in sterile fashion.  With real-time ultrasound guidance a central venous catheter was placed into the left internal jugular vein. Nonpulsatile blood flow and easy flushing noted in all ports.  The catheter was sutured in place and sterile dressing applied.  Complications/Tolerance None; patient tolerated the procedure well. Chest X-ray is ordered to verify placement for internal jugular or subclavian cannulation.   Chest x-ray is not ordered for femoral cannulation.  EBL Minimal  Specimen(s) None

## 2022-03-03 NOTE — Progress Notes (Addendum)
ANTICOAGULATION CONSULT NOTE - Initial Consult  Pharmacy Consult for Heparin Indication: chest pain/ACS  Allergies  Allergen Reactions   Nsaids Other (See Comments)    Chronic kidney disease   Valacyclovir Nausea And Vomiting   Lisinopril Rash    Patient Measurements: Height: 6' (182.9 cm) Weight: 83.9 kg (184 lb 15.5 oz) IBW/kg (Calculated) : 77.6 Heparin Dosing Weight: 84 kg  Vital Signs: Temp: 98.4 F (36.9 C) (12/30 1403) Temp Source: Axillary (12/30 1403) BP: 135/81 (12/30 1848) Pulse Rate: 119 (12/30 1848)  Labs: Recent Labs    03/02/22 2153 03/02/22 2352 03/03/22 0133  HGB 10.0*  --  9.5*  HCT 29.2*  --  28.0*  PLT 323  --   --   CREATININE 6.08*  --   --   TROPONINIHS 690* 582*  --     Estimated Creatinine Clearance: 10.6 mL/min (A) (by C-G formula based on SCr of 6.08 mg/dL (H)).   Medical History: Past Medical History:  Diagnosis Date   Aortic insufficiency    ESRD on peritoneal dialysis ([place])    Hyperlipidemia    Hypertension     Medications:  Medications Prior to Admission  Medication Sig Dispense Refill Last Dose   amLODipine (NORVASC) 10 MG tablet Take 10 mg by mouth daily.      doxazosin (CARDURA) 2 MG tablet Take 2 mg by mouth 2 (two) times daily.      losartan (COZAAR) 50 MG tablet Take 50 mg by mouth daily. Reported on 02/16/2015      omeprazole (PRILOSEC) 20 MG capsule Take 20 mg by mouth daily. Reported on 02/16/2015      sodium bicarbonate 1 mEq/mL SOLN Take by mouth.      torsemide (DEMADEX) 20 MG tablet Take 20 mg by mouth daily.       Assessment: 81 y.o.M presents with PNA and SOB. Pt to begin heparin for ACS. Trop up to 582. Hgb 9.5. Noted pt with ESRD and on PD. Not on AC PTA.  Goal of Therapy:  Heparin level 0.3-0.7 units/ml Monitor platelets by anticoagulation protocol: Yes   Plan:  Heparin IV bolus 4000 units Heparin gtt at 1150 units/hr Will f/u heparin level in 8 hours Daily heparin level and CBC   Sherlon Handing,  PharmD, BCPS Please see amion for complete clinical pharmacist phone list 03/03/2022,7:05 PM

## 2022-03-03 NOTE — Procedures (Signed)
Intubation Procedure Note  Harutyun Monteverde  [id number]  November 30, 1940  Date:03/03/22  Time:6:18 PM   Provider Performing:Lynwood Kubisiak C Tamala Julian    Procedure: Intubation (14239)  Indication(s) Respiratory Failure  Consent Verbal   Anesthesia Etomidate, Versed, and Fentanyl   Time Out Verified patient identification, verified procedure, site/side was marked, verified correct patient position, special equipment/implants available, medications/allergies/relevant history reviewed, required imaging and test results available.   Sterile Technique Usual hand hygeine, masks, and gloves were used   Procedure Description Patient positioned in bed supine.  Sedation given as noted above.  Patient was intubated with endotracheal tube using Glidescope.  View was Grade 1 full glottis .  Number of attempts was 1.  Colorimetric CO2 detector was consistent with tracheal placement.   Complications/Tolerance None; patient tolerated the procedure well. Chest X-ray is ordered to verify placement.   EBL Minimal   Specimen(s) None

## 2022-03-03 NOTE — ED Provider Notes (Signed)
Patient signed out to me at 0700 pending admission for pneumonia on high flow nasal cannula.  Patient has had his high flow increased to 30 L and he is currently satting 90%.  He is persistent coughing on exam and is mildly tachypneic but has no retractions and is speaking in full sentences.  Patient will have repeat ABG performed due to his persistent hypoxia.  He is agreeable for a trial on BiPAP.  He was trialed with a DuoNeb without significant improvement and will be trialed with a saline neb.  He was recommended to lay with his left side up to help with oxygenation.  Patient was discussed if he continues to have worsening hypoxia they may require intubation and he states that he would like to be DNI.  Patient does have a bed assigned at [facility] and is pending transport at this time.   Leanord Asal K, DO 03/03/22 [phone]

## 2022-03-03 NOTE — Progress Notes (Signed)
[facility] Progress Note Patient Name: Deadrick Stidd DOB: [birth year] MRN: [id number]   Date of Service  03/03/2022  HPI/Events of Note  Patient prescribed  Aspirin in the context of elevated troponin, acute coronary syndrome.  eICU Interventions  Okay to continue 81 mg of ASA daily, benefit outweighs risk.        Christell Steinmiller U Kajah Santizo 03/03/2022, 10:00 PM

## 2022-03-03 NOTE — ED Notes (Signed)
Pt had BM. Used bedside commode.

## 2022-03-03 NOTE — ED Notes (Signed)
Pt was provided 4oz of orange juice, 4 oz of milk 2%, 6oz coffee, dry cereal for breakfast

## 2022-03-03 NOTE — ED Notes (Signed)
Pt is in pain at this time and states it is from coughing so much that it makes him throw up. Pt noted to be continuously coughing. Provider assessed patient and spoke with him.

## 2022-03-03 NOTE — H&P (Signed)
History and Physical    Patient: William [name] HYQ:[id number] DOB: 09/30/1940 DOA: 03/02/2022 DOS: the patient was seen and examined on 03/03/2022 PCP: [name], PA-C  Patient coming from: Home  Chief Complaint:  Chief Complaint  Patient presents with   Emesis   Shortness of Breath       HPI:  William [name] is an 81 y.o. M with ESRD on PD for 3 years, followed at [place], HTN and subclinical hypothyroidism who presented with few days malaise and SOB, cough.  In the ER, CXR showed a lobar pneumonia, and he was hypoxic on room air.  Started on antibiotics and planned for transfer.  Had significant worsening of oxygen needs overnight while waiting in the ER.      Review of Systems  Constitutional:  Positive for malaise/fatigue. Negative for chills and fever.  Respiratory:  Positive for cough and shortness of breath. Negative for hemoptysis and sputum production.   Cardiovascular:  Positive for orthopnea. Negative for chest pain and leg swelling.  Musculoskeletal:  Negative for myalgias.  All other systems reviewed and are negative.    Past Medical History:  Diagnosis Date   Aortic insufficiency    ESRD on peritoneal dialysis ([place])    Hyperlipidemia    Hypertension    Past Surgical History:  Procedure Laterality Date   APPENDECTOMY     CARDIOVASCULAR STRESS TEST  06/08/2009   EF 43%   CHOLECYSTECTOMY     FOOT SURGERY     HERNIA REPAIR     US ECHOCARDIOGRAPHY  05/24/2009   EF 60%   Social History:  reports that he quit smoking about 56 years ago. He has never used smokeless tobacco. He reports that he does not drink alcohol and does not use drugs.  Allergies  Allergen Reactions   Nsaids Other (See Comments)    Chronic kidney disease   Valacyclovir Nausea And Vomiting   Lisinopril Rash    Family History  Problem Relation Age of Onset   Cancer Mother    Cirrhosis Father    Diabetes Brother     Prior to Admission medications   Medication  Sig Start Date End Date Taking? Authorizing Provider  amLODipine (NORVASC) 10 MG tablet TAKE ONE TABLET BY MOUTH ONCE DAILY 08/08/15   [provider]  doxazosin (CARDURA) 2 MG tablet Take 2 mg by mouth 2 (two) times daily. 05/04/19   [provider]  losartan (COZAAR) 50 MG tablet Take 50 mg by mouth daily. Reported on 02/16/2015    [provider]  omeprazole (PRILOSEC) 20 MG capsule Take 20 mg by mouth daily. Reported on 02/16/2015    [provider]  sodium bicarbonate 1 mEq/mL SOLN Take by mouth.    [provider]  torsemide (DEMADEX) 20 MG tablet Take by mouth. 07/18/17   [provider]    Physical Exam: Vitals:   03/03/22 1245 03/03/22 1403 03/03/22 1500 03/03/22 1516  BP:  137/79    Pulse: 80 91    Resp:  (!) 32    Temp:  98.4 F (36.9 C)    TempSrc:  Axillary    SpO2: 91% 91% 92% 92%  Weight:  83.9 kg    Height:  6' (1.829 m)     Elderly adult male, on BiPAP, appears dyspneic and in respiratory distress, makes eye contact and is alert and oriented Tachycardic, regular, no murmurs, JVP not visible, no peripheral edema Lung sounds diminished, rales on the right, lung sounds are  very audible in the left, no wheezing Abdomen soft without tenderness palpation or guarding, no ascites or distention Attention normal, affect blunted by respiratory distress, face symmetric, speech fluent, moves all extremities with generalized weakness but symmetric strength    Data Reviewed: Discussed with nephrology critical care Patient metabolic panel shows sodium 129, BUN 59, creatinine 6.08 Transaminases normal Complete blood count shows mild anemia, leukocytes 11.2 K COVID, flu, and RSV negative Lipase normal Troponin in the 600s, myocardial injury due to renal failure and pneumonia BNP 1300 ABG 7.3, pCO2 32, pO2 61, bicarb 16 Chest x-ray with clear focal right upper lobe pneumonia Repeat chest x-ray, personally reviewed by me, shows  now bilateral infiltrates    Assessment and Plan: * Acute respiratory failure with hypoxia (HCC)    Acute metabolic acidosis    Anemia in chronic kidney disease (CKD)    Hyponatremia    Community acquired pneumonia of right upper lobe of lung    ESRD (end stage renal disease) ([place])    Hypertension     The patient deteriorated overnight while at the [facility].  This morning he had deteriorated to the point that he had to be escalated to BiPAP and ABG obtained, but the hospitalist service were not notified.  I was paged about the patient's arrival at 2:27PM.  I evaluated the patient immediately and found the patient in significant distress.  I discussed the case with Respiratory therapy and Nephrology and started IV Lasix.  For his pneumonia, I initiated broad spectrum antibiotics and ordered urine antigens for strep, and MRSA swab for the nose.  Blood cultures were obtained in the ER.  For his worsening respiratory failure, after discussion with family the patient reversed his CODE STATUS to full code, critical care was consulted, and recommended transfer to the ICU.  For his metabolic acidosis and end stage renal disease with fluid overload, nephrology plan dialysis, will coordinate with critical care the medium.      Advance Care Planning: See above, patient initially reported to me being DNR.  Subsequent discussions with family, they convinced him to undergo trial of intubation if needed  Consults: Nephrology, Critical Care  Family Communication: Wife, daughter  Severity of Illness: The appropriate patient status for this patient is INPATIENT. Inpatient status is judged to be reasonable and necessary in order to provide the required intensity of service to ensure the patient's safety. The patient's presenting symptoms, physical exam findings, and initial radiographic and laboratory data in the context of their chronic comorbidities is felt to place  them at high risk for further clinical deterioration. Furthermore, it is not anticipated that the patient will be medically stable for discharge from the hospital within 2 midnights of admission.   * I certify that at the point of admission it is my clinical judgment that the patient will require inpatient hospital care spanning beyond 2 midnights from the point of admission due to high intensity of service, high risk for further deterioration and high frequency of surveillance required.*  The patient is critically ill with multi-organ failure.  Critical care was necessary to treat or prevent imminent or life-threatening deterioration of   respiratory failure, renal failure and was exclusive of separately billable procedures and treating other patients. Total critical care time spent by me: 70 minutes Time spent personally by me on obtaining history from patient or surrogate, evaluation of the patient, evaluation of patient's response to treatment, ordering and review of laboratory studies, ordering and review of radiographic  studies, ordering and performing treatments and interventions, and re-evaluation of the patient's condition.     Author: Edwin Dada, MD 03/03/2022 6:04 PM  For on call review [email or web address].

## 2022-03-03 NOTE — ED Notes (Signed)
Pt on heated high flow at 20L and set at 35 celcius. Pt seems to be doing much better with this set-up.

## 2022-03-03 NOTE — Progress Notes (Signed)
Patient's SPO2 is 89% on 10 liter Hi Flo nasal cannula.  Increased patient's Hi-Flo nasal cannula to 15 liters.  Patient tolerating well.  Patient's SPO2 is now 95%.  RT will continue to monitor.

## 2022-03-03 NOTE — Progress Notes (Signed)
[place]ter Hi-Flo irritated patien't nasal passages.  Patient did not tolerate non-rebreather well. Placed patient on heated Hi-Flo at 20 liters, Celcius 35, and blender set at 100%.  Patient tolerating well.  RT will continue to monitor.

## 2022-03-03 NOTE — Progress Notes (Signed)
Pharmacy Antibiotic Note  William [name] is a 81 y.o. male admitted on 03/02/2022 with pneumonia.  Pharmacy has been consulted for Vancomycin dosing. Pt also on Rocephin and Azithromycin.   Pt with ESRD - on peritoneal dialysis and will continue this in the hospital.  Plan: Vancomycin '2000mg'$  IV now  Will f/u vancomycin level in 3-4 days and redose if <20 Will f/u peritoneal dialysis tolerance and micro data  Height: 6' (182.9 cm) Weight: 83.9 kg (184 lb 15.5 oz) IBW/kg (Calculated) : 77.6  Temp (24hrs), Avg:97.9 F (36.6 C), Min:97.7 F (36.5 C), Max:98.4 F (36.9 C)  Recent Labs  Lab 03/02/22 2153  WBC 11.2*  CREATININE 6.08*    Estimated Creatinine Clearance: 10.6 mL/min (A) (by C-G formula based on SCr of 6.08 mg/dL (H)).    Allergies  Allergen Reactions   Nsaids Other (See Comments)    Chronic kidney disease   Valacyclovir Nausea And Vomiting   Lisinopril Rash    Antimicrobials this admission: 12/29 Ceftriaxone >>  12/29 Azith >>  12/30 Vanc >>  Microbiology results: 12/29 BCx:   Sputum:    MRSA PCR:   Thank you for allowing pharmacy to be a part of this patient's care.  Sherlon Handing, PharmD, BCPS Please see amion for complete clinical pharmacist phone list 03/03/2022 3:50 PM

## 2022-03-03 NOTE — Progress Notes (Signed)
Patient admitted to [place] around 1700 on 45L 100 FiO2 HHFNC. Patient arrived via wheelchair and walked from it to bed. Was SOB with that activity. Patient insisting on eating dinner. Increasing SOB and SpO2 dropped to 83% with eating. Tamala Julian MD to bedside. Patient lost IV access before transfer. This RN tried twice for IV for intubation without success. IV team to bedside and placed ultrasound guided IV for intubation medications. Patient intubated around 1800. See MAR for medications given. Triple lumen CVC also placed in left IJ. HR up to 130s, monitor alarming ST elevation. EKG obtained and sent to [facility] MD. Orders received for heparin gtt, aspiring, and limited echo. Will update night shift RN on new orders.

## 2022-03-03 NOTE — ED Provider Notes (Addendum)
Care of the patient assumed at the change of shift pending consult from [facility] with Dr. [place] Crane who will accept the patient for admission. She has requested an ABG.    5:07 AM Patient continues to cough, SpO2 improved on HFNC but now also having abdominal pain and headache, likely from the persistent cough. He is not tender and no peritoneal signs to suggest SBP. Additional pain meds ordered while awaiting transport to inpatient bed.   Truddie Hidden, MD 03/03/22 [phone]

## 2022-03-03 NOTE — Progress Notes (Signed)
Report given to receiving 2H RN.

## 2022-03-03 NOTE — Progress Notes (Signed)
Plan of Care Note for accepted transfer   Patient: William [name] MRN: [id number]   DOA: 03/02/2022  Facility requesting transfer: [facility] ED Requesting Provider: Dr. Karle Starch, [place]  Reason for transfer: Right upper lobe pneumonia.  Facility course: The patient is an 81 year old male with a history of ESRD on peritoneal dialysis, presents with a few days of cough, congestion.  In the ED, hypoxic on arrival.  COVID-19 virus screening test, RSV by PCR, influenza A, influenza B, all negative.  Chest x-ray showing right upper lobe infiltrates suggestive of pneumonia.    Lab studies remarkable for mild leukocytosis with WBC 11.2 K.  Elevated troponin, peaked at 690 and downtrended.    EDP discussed the case with cardiology, elevation in troponin thought to be demand ischemia secondary to hypoxia.  He was placed on high flow nasal cannula up to 20 L, to maintain O2 saturation greater than 92%.  ABG 7.316/32.7/61/16.7.  The patient was accepted at [facility] progressive care unit as inpatient status.  Contact nephrology once the patient arrives to the hospital to assist with peritoneal dialysis management.    Plan of care: The patient is accepted for admission to Progressive unit, at [facility] as inpatient status.  Author: Kayleen Memos, DO 03/03/2022  Check [email or web address] for on-call coverage.  Nursing staff, Please call [place] number on Amion as soon as patient's arrival, so appropriate admitting provider can evaluate the pt.

## 2022-03-03 NOTE — Progress Notes (Signed)
Pt transported from 3east to 2 H 23  on HHFNC without difficulty.

## 2022-03-03 NOTE — Progress Notes (Addendum)
Dialysis consent signed and placed on pt chart. Urinary antigen, Mrsa pcr , Respiratory panel collected.

## 2022-03-03 NOTE — Consult Note (Signed)
Renal Service Consult Note [facility]  William [name] 03/03/2022 William Blazing, MD Requesting Physician: Dr. Loleta Books  Reason for Consult: ESRD pt on PD w/ acute SOB and PNA HPI: The patient is a 81 y.o. year-old w/ PMH as below who presented to ED for N/V and SOB x 2 days, unable to take pos. Hx of CCPD x 3 yrs for ESRD.  In ED CXR showed RUL PNA. O2 needs rising rapidly and repeat CXR shows sig worsening R > L. Asked to see for ESRD.   Pt seen in room. Has had good appetite up until the last 1-2 days. Has not been losing weight. Usual wt is 181- 185lb. Wt here is 185 lb. No swelling of LE's.     ROS - denies CP, no joint pain, no HA, no blurry vision, no rash, no diarrhea, no nausea/ vomiting, no dysuria, no difficulty voiding   Past Medical History  Past Medical History:  Diagnosis Date   Aortic insufficiency    Hemodialysis patient ([place])    Hyperlipidemia    Hypertension    Renal failure    Renal insufficiency    WITH PROTEINURIA, creatnine 2.1   Past Surgical History  Past Surgical History:  Procedure Laterality Date   APPENDECTOMY     CARDIOVASCULAR STRESS TEST  06/08/2009   EF 43%   CHOLECYSTECTOMY     FOOT SURGERY     HERNIA REPAIR     US ECHOCARDIOGRAPHY  05/24/2009   EF 60%   Family History  Family History  Problem Relation Age of Onset   Cancer Mother    Cirrhosis Father    Diabetes Brother    Social History  reports that he quit smoking about 56 years ago. He has never used smokeless tobacco. He reports that he does not drink alcohol and does not use drugs. Allergies  Allergies  Allergen Reactions   Nsaids Other (See Comments)    Chronic kidney disease   Valacyclovir Nausea And Vomiting   Lisinopril Rash   Home medications Prior to Admission medications   Medication Sig Start Date End Date Taking? Authorizing Provider  amLODipine (NORVASC) 10 MG tablet TAKE ONE TABLET BY MOUTH ONCE DAILY 08/08/15   [provider]   doxazosin (CARDURA) 2 MG tablet Take 2 mg by mouth 2 (two) times daily. 05/04/19   [provider]  losartan (COZAAR) 50 MG tablet Take 50 mg by mouth daily. Reported on 02/16/2015    [provider]  omeprazole (PRILOSEC) 20 MG capsule Take 20 mg by mouth daily. Reported on 02/16/2015    [provider]  sodium bicarbonate 1 mEq/mL SOLN Take by mouth.    [provider]  torsemide (DEMADEX) 20 MG tablet Take by mouth. 07/18/17   [provider]     Vitals:   03/03/22 1245 03/03/22 1403 03/03/22 1500 03/03/22 1516  BP:  137/79    Pulse: 80 91    Resp:  (!) 32    Temp:  98.4 F (36.9 C)    TempSrc:  Axillary    SpO2: 91% 91% 92% 92%  Weight:  83.9 kg    Height:  6' (1.829 m)     Exam Gen alert, HFNC, mild-mod ^wob No rash, cyanosis or gangrene Sclera anicteric, throat clear  JVP is 8-10, no  bruits Chest scattered rales R > L, no wheezing RRR no MRG Abd soft ntnd no mass or ascites +bs GU normal male MS no joint effusions or  deformity Ext no LE or UE edema, no wounds or ulcers Neuro is alert, Ox 3 , nf    PD cath in mid abdomen      Home meds include - norvasc 10, cardura 2 bid, losartan 50 qd, prilosec, sod bicarb, torsemide 20 qd, prns     OP PD: f/b Dr Smitty Cords from [facility]. 4 exchanges overnight, 2500 cc dwell vol, uses mostly 1.5% and 2.5% bags.  On PD for 3 years.     Assessment/ Plan: PNA - R > L, worsening clinically and by CXR over last 24 hrs. Seen by CCM, will be moved to ICU. Agrees to intubation if needed.  ESRD - on PD x 3 yrs. Plan PD tonight, all 4.25% just in case there is a volume component.  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  HTN - home BP lowering meds are on hold, BP's are good      Kelly Splinter  MD 03/03/2022, 5:11 PM Recent Labs  Lab 03/02/22 2153 03/03/22 0133  HGB 10.0* 9.5*  ALBUMIN 3.2*  --    CALCIUM 8.4*  --   CREATININE 6.08*  --   K 4.3 4.3   Inpatient medications:  Chlorhexidine Gluconate Cloth  6 each Topical Daily   gentamicin cream  1 Application Topical Daily   heparin injection (subcutaneous)  5,000 Units Subcutaneous Q8H   pantoprazole  40 mg Oral Daily   vancomycin variable dose per unstable renal function (pharmacist dosing)   Does not apply See admin instructions    azithromycin     cefTRIAXone (ROCEPHIN)  IV     dialysis solution 4.25% low-MG/low-CA     vancomycin     acetaminophen **OR** acetaminophen, ondansetron **OR** ondansetron (ZOFRAN) IV

## 2022-03-03 NOTE — Procedures (Signed)
Bronchoscopy Procedure Note  William [name]  [id number]  1940/09/19  Date:03/03/22  Time:6:19 PM   Provider Performing:Rylinn Linzy C Tamala Julian   Procedure(s):  Flexible bronchoscopy with bronchial alveolar lavage (88828)  Indication(s) Pneumonia  Consent Verbal  Anesthesia In place from intubation   Time Out Verified patient identification, verified procedure, site/side was marked, verified correct patient position, special equipment/implants available, medications/allergies/relevant history reviewed, required imaging and test results available.   Sterile Technique Usual hand hygiene, masks, gowns, and gloves were used   Procedure Description Bronchoscope advanced through endotracheal tube and into airway.  Airways were examined down to subsegmental level with findings noted below.   Following diagnostic evaluation, BAL(s) performed in RUL with normal saline and return of mostly clear fluid  Findings:  - ETT slightly high, advanced - Thin secretions - Return suprisingly not very purulent   Complications/Tolerance None; patient tolerated the procedure well. Chest X-ray is not needed post procedure.   EBL Minimal   Specimen(s) RUL BAL

## 2022-03-03 NOTE — Consult Note (Signed)
NAME:  William [name], MRN:  [id number], DOB:  Jul 15, 1940, LOS: 0 ADMISSION DATE:  03/02/2022, CONSULTATION DATE:  03/03/22 REFERRING MD:  Loleta Books, CHIEF COMPLAINT:  SOB   History of Present Illness:  81 year old man with hx of CHF, ESRD on PD, HTN, HLD presenting with cough, DOE, intermittent sputum production.  Denies sick contacts.  +subjective fevers.  CXR showing RUL PNA.  O2 needs have escalated rapidly and CXR deteriorated.  PCCM consulted.   Pertinent  Medical History  ESRD on PD Aortic root dilation Mildly reduced EF HTN BPH  Interim History / Subjective:  Consulted  Objective   Blood pressure 137/79, pulse 91, temperature 98.4 F (36.9 C), temperature source Axillary, resp. rate (!) 32, height 6' (1.829 m), weight 83.9 kg, SpO2 92 %.    FiO2 (%):  [60 %-100 %] 100 %   Intake/Output Summary (Last 24 hours) at 03/03/2022 1603 Last data filed at 03/03/2022 0108 Gross per 24 hour  Intake 350 ml  Output --  Net 350 ml   Filed Weights   03/02/22 2107 03/03/22 1403  Weight: 83.9 kg 83.9 kg    Examination: Sitting up in bed coughing HHFNC in place 40LPM sats 92% Ext warm without edema PD catheter dressed Abd soft  CXR reviewed: severe developing ARDS Trops mildly up TTE June 2023 SUMMARY  There is mild-moderate eccentric left ventricular hypertrophy.  The left ventricle is moderately dilated.  Left ventricular systolic function is moderately reduced.  The LV inferoseptal and inferior walls are akinetic.  LV ejection fraction = 35-40%.  There is aortic valve sclerosis.  There is no aortic stenosis.  There is mild to moderate aortic regurgitation.  There is moderate mitral regurgitation.  The aortic root is moderately dilated at 4.6 cm.  There is no comparison study available.    Resolved Hospital Problem list   N/A  Assessment & Plan:  Acute hypoxemic respiratory failure secondary to severe R sided CAP  ESRD on PD  Hx HTN  Hx  HFrEF  Troponemia, type II response  His clinical trajectory is concerning. Pilar Plate talk with him and family at bedside that if intubation is not on table we should consider moving to a more comfort-based approach.  On discussing this with family, patient now is open to trial of mechanical ventilation but does not want to die on ventilator.  - Vanc/azithro/ceftriaxone - PD per Nephro - HHFNC for now, move to ICU in anticipation for needing intubation (already getting restless, BIPAP not helpful) - If intubated, bronch - Check strep/legionella Ag, Pct  Best Practice (right click and "Reselect all SmartList Selections" daily)   Diet/type: NPO DVT prophylaxis: prophylactic heparin  GI prophylaxis: N/A Lines: N/A Foley:  N/A Code Status:  DNR Last date of multidisciplinary goals of care discussion [Today]  Labs   CBC: Recent Labs  Lab 03/02/22 2153 03/03/22 0133  WBC 11.2*  --   NEUTROABS 9.8*  --   HGB 10.0* 9.5*  HCT 29.2* 28.0*  MCV 98.0  --   PLT 323  --     Basic Metabolic Panel: Recent Labs  Lab 03/02/22 2153 03/03/22 0133  NA 129* 130*  K 4.3 4.3  CL 96*  --   CO2 20*  --   GLUCOSE 120*  --   BUN 59*  --   CREATININE 6.08*  --   CALCIUM 8.4*  --    GFR: Estimated Creatinine Clearance: 10.6 mL/min (A) (by C-G formula based on SCr of  6.08 mg/dL (H)). Recent Labs  Lab 03/02/22 2153  WBC 11.2*    Liver Function Tests: Recent Labs  Lab 03/02/22 2153  AST 24  ALT 18  ALKPHOS 52  BILITOT 0.3  PROT 7.1  ALBUMIN 3.2*   Recent Labs  Lab 03/02/22 2153  LIPASE 26   No results for input(s): "AMMONIA" in the last 168 hours.  ABG    Component Value Date/Time   PHART 7.316 (L) 03/03/2022 0133   PCO2ART 32.7 03/03/2022 0133   PO2ART 61 (L) 03/03/2022 0133   HCO3 16.7 (L) 03/03/2022 0133   TCO2 18 (L) 03/03/2022 0133   ACIDBASEDEF 9.0 (H) 03/03/2022 0133   O2SAT 90 03/03/2022 0133     Coagulation Profile: No results for input(s): "INR",  "PROTIME" in the last 168 hours.  Cardiac Enzymes: No results for input(s): "CKTOTAL", "CKMB", "CKMBINDEX", "TROPONINI" in the last 168 hours.  HbA1C: No results found for: "HGBA1C"  CBG: No results for input(s): "GLUCAP" in the last 168 hours.  Review of Systems:    Positive Symptoms in bold:  Constitutional fevers, chills, weight loss, fatigue, anorexia, malaise  Eyes decreased vision, double vision, eye irritation  Ears, Nose, Mouth, Throat sore throat, trouble swallowing, sinus congestion  Cardiovascular chest pain, paroxysmal nocturnal dyspnea, lower ext edema, palpitations   Respiratory SOB, cough, DOE, hemoptysis, wheezing  Gastrointestinal nausea, vomiting, diarrhea  Genitourinary burning with urination, trouble urinating  Musculoskeletal joint aches, joint swelling, back pain  Integumentary  rashes, skin lesions  Neurological focal weakness, focal numbness, trouble speaking, headaches  Psychiatric depression, anxiety, confusion  Endocrine polyuria, polydipsia, cold intolerance, heat intolerance  Hematologic abnormal bruising, abnormal bleeding, unexplained nose bleeds  Allergic/Immunologic recurrent infections, hives, swollen lymph nodes     Past Medical History:  He,  has a past medical history of Aortic insufficiency, Hemodialysis patient ([place]), Hyperlipidemia, Hypertension, Renal failure, and Renal insufficiency.   Surgical History:   Past Surgical History:  Procedure Laterality Date   APPENDECTOMY     CARDIOVASCULAR STRESS TEST  06/08/2009   EF 43%   CHOLECYSTECTOMY     FOOT SURGERY     HERNIA REPAIR     US ECHOCARDIOGRAPHY  05/24/2009   EF 60%     Social History:   reports that he quit smoking about 56 years ago. He has never used smokeless tobacco. He reports that he does not drink alcohol and does not use drugs.   Family History:  His family history includes Cancer in his mother; Cirrhosis in his father; Diabetes in his brother.    Allergies Allergies  Allergen Reactions   Nsaids Other (See Comments)    Chronic kidney disease   Valacyclovir Nausea And Vomiting   Lisinopril Rash     Home Medications  Prior to Admission medications   Medication Sig Start Date End Date Taking? Authorizing Provider  amLODipine (NORVASC) 10 MG tablet TAKE ONE TABLET BY MOUTH ONCE DAILY 08/08/15   [provider]  doxazosin (CARDURA) 2 MG tablet Take 2 mg by mouth 2 (two) times daily. 05/04/19   [provider]  losartan (COZAAR) 50 MG tablet Take 50 mg by mouth daily. Reported on 02/16/2015    [provider]  omeprazole (PRILOSEC) 20 MG capsule Take 20 mg by mouth daily. Reported on 02/16/2015    [provider]  sodium bicarbonate 1 mEq/mL SOLN Take by mouth.    [provider]  torsemide (DEMADEX) 20 MG tablet Take by mouth. 07/18/17   [provider]     Critical care time: 34 minutes independent of procedures

## 2022-03-03 NOTE — Hospital Course (Signed)
Mr. Woodmansee is an 81 y.o. M with ESRD on PD for 3 years, followed at [place], HTN and subclinical hypothyroidism who presented with few days malaise and SOB, cough.  In the ER, CXR showed a lobar pneumonia, and he was hypoxic on room air.  Started on antibiotics and planned for transfer.  Had significant worsening of oxygen needs overnight while waiting in the ER.

## 2022-03-04 ENCOUNTER — Inpatient Hospital Stay (HOSPITAL_COMMUNITY): Payer: PPO

## 2022-03-04 DIAGNOSIS — I255 Ischemic cardiomyopathy: Secondary | ICD-10-CM

## 2022-03-04 DIAGNOSIS — R9431 Abnormal electrocardiogram [ECG] [EKG]: Secondary | ICD-10-CM

## 2022-03-04 DIAGNOSIS — I5020 Unspecified systolic (congestive) heart failure: Secondary | ICD-10-CM

## 2022-03-04 DIAGNOSIS — J9601 Acute respiratory failure with hypoxia: Secondary | ICD-10-CM | POA: Diagnosis not present

## 2022-03-04 LAB — ECHOCARDIOGRAM COMPLETE
AR max vel: 3.34 cm2
AV Area VTI: 3.31 cm2
AV Area mean vel: 3.54 cm2
AV Mean grad: 4 mmHg
AV Peak grad: 8 mmHg
Ao pk vel: 1.41 m/s
Area-P 1/2: 1.96 cm2
Calc EF: 43.8 %
Height: 72 in
P 1/2 time: 775 msec
S' Lateral: 4.9 cm
Single Plane A2C EF: 46.1 %
Single Plane A4C EF: 39.8 %
Weight: 3054.69 oz

## 2022-03-04 LAB — POCT I-STAT 7, (LYTES, BLD GAS, ICA,H+H)
Acid-base deficit: 6 mmol/L — ABNORMAL HIGH (ref 0.0–2.0)
Bicarbonate: 18.1 mmol/L — ABNORMAL LOW (ref 20.0–28.0)
Calcium, Ion: 1.11 mmol/L — ABNORMAL LOW (ref 1.15–1.40)
HCT: 28 % — ABNORMAL LOW (ref 39.0–52.0)
Hemoglobin: 9.5 g/dL — ABNORMAL LOW (ref 13.0–17.0)
O2 Saturation: 96 %
Patient temperature: 97
Potassium: 3.9 mmol/L (ref 3.5–5.1)
Sodium: 132 mmol/L — ABNORMAL LOW (ref 135–145)
TCO2: 19 mmol/L — ABNORMAL LOW (ref 22–32)
pCO2 arterial: 29.2 mmHg — ABNORMAL LOW (ref 32–48)
pH, Arterial: 7.396 (ref 7.35–7.45)
pO2, Arterial: 79 mmHg — ABNORMAL LOW (ref 83–108)

## 2022-03-04 LAB — GLUCOSE, CAPILLARY
Glucose-Capillary: 105 mg/dL — ABNORMAL HIGH (ref 70–99)
Glucose-Capillary: 113 mg/dL — ABNORMAL HIGH (ref 70–99)
Glucose-Capillary: 128 mg/dL — ABNORMAL HIGH (ref 70–99)
Glucose-Capillary: 130 mg/dL — ABNORMAL HIGH (ref 70–99)
Glucose-Capillary: 134 mg/dL — ABNORMAL HIGH (ref 70–99)
Glucose-Capillary: 141 mg/dL — ABNORMAL HIGH (ref 70–99)
Glucose-Capillary: 151 mg/dL — ABNORMAL HIGH (ref 70–99)
Glucose-Capillary: 185 mg/dL — ABNORMAL HIGH (ref 70–99)
Glucose-Capillary: 200 mg/dL — ABNORMAL HIGH (ref 70–99)
Glucose-Capillary: 322 mg/dL — ABNORMAL HIGH (ref 70–99)
Glucose-Capillary: 73 mg/dL (ref 70–99)
Glucose-Capillary: 99 mg/dL (ref 70–99)

## 2022-03-04 LAB — BASIC METABOLIC PANEL
Anion gap: 16 — ABNORMAL HIGH (ref 5–15)
BUN: 61 mg/dL — ABNORMAL HIGH (ref 8–23)
CO2: 17 mmol/L — ABNORMAL LOW (ref 22–32)
Calcium: 8 mg/dL — ABNORMAL LOW (ref 8.9–10.3)
Chloride: 95 mmol/L — ABNORMAL LOW (ref 98–111)
Creatinine, Ser: 6.32 mg/dL — ABNORMAL HIGH (ref 0.61–1.24)
GFR, Estimated: 8 mL/min — ABNORMAL LOW (ref >60)
Glucose, Bld: 374 mg/dL — ABNORMAL HIGH (ref 70–99)
Potassium: 4.1 mmol/L (ref 3.5–5.1)
Sodium: 128 mmol/L — ABNORMAL LOW (ref 135–145)

## 2022-03-04 LAB — CBC
HCT: 25 % — ABNORMAL LOW (ref 39.0–52.0)
Hemoglobin: 8.3 g/dL — ABNORMAL LOW (ref 13.0–17.0)
MCH: 33.7 pg (ref 26.0–34.0)
MCHC: 33.2 g/dL (ref 30.0–36.0)
MCV: 101.6 fL — ABNORMAL HIGH (ref 80.0–100.0)
Platelets: 360 10*3/uL (ref 150–400)
RBC: 2.46 MIL/uL — ABNORMAL LOW (ref 4.22–5.81)
RDW: 11.9 % (ref 11.5–15.5)
WBC: 12.7 10*3/uL — ABNORMAL HIGH (ref 4.0–10.5)
nRBC: 0 % (ref 0.0–0.2)

## 2022-03-04 LAB — TROPONIN I (HIGH SENSITIVITY)
Troponin I (High Sensitivity): 912 ng/L (ref <18)
Troponin I (High Sensitivity): 933 ng/L (ref <18)

## 2022-03-04 LAB — HEPARIN LEVEL (UNFRACTIONATED)
Heparin Unfractionated: 0.27 IU/mL — ABNORMAL LOW (ref 0.30–0.70)
Heparin Unfractionated: 0.44 IU/mL (ref 0.30–0.70)

## 2022-03-04 LAB — PHOSPHORUS: Phosphorus: 6.2 mg/dL — ABNORMAL HIGH (ref 2.5–4.6)

## 2022-03-04 LAB — MAGNESIUM: Magnesium: 1.7 mg/dL (ref 1.7–2.4)

## 2022-03-04 MED ORDER — MAGNESIUM SULFATE 2 GM/50ML IV SOLN
2.0000 g | Freq: Once | INTRAVENOUS | Status: AC
  Administered 2022-03-04: 2 g via INTRAVENOUS
  Filled 2022-03-04: qty 50

## 2022-03-04 MED ORDER — INSULIN REGULAR(HUMAN) IN NACL 100-0.9 UT/100ML-% IV SOLN
INTRAVENOUS | Status: DC
  Administered 2022-03-04: 11 [IU]/h via INTRAVENOUS
  Filled 2022-03-04: qty 100

## 2022-03-04 MED ORDER — INSULIN DETEMIR 100 UNIT/ML ~~LOC~~ SOLN
5.0000 [IU] | Freq: Two times a day (BID) | SUBCUTANEOUS | Status: DC
  Administered 2022-03-04 – 2022-03-14 (×17): 5 [IU] via SUBCUTANEOUS
  Filled 2022-03-04 (×25): qty 0.05

## 2022-03-04 MED ORDER — MIDAZOLAM HCL 2 MG/2ML IJ SOLN
1.0000 mg | INTRAMUSCULAR | Status: DC | PRN
  Administered 2022-03-04 – 2022-03-05 (×7): 2 mg via INTRAVENOUS
  Filled 2022-03-04 (×7): qty 2

## 2022-03-04 MED ORDER — DEXTROSE 50 % IV SOLN: 0.0000 mL | INTRAVENOUS | Status: DC | PRN

## 2022-03-04 MED ORDER — QUETIAPINE FUMARATE 50 MG PO TABS
50.0000 mg | ORAL_TABLET | Freq: Two times a day (BID) | ORAL | Status: DC
  Administered 2022-03-04 – 2022-03-05 (×3): 50 mg
  Filled 2022-03-04 (×3): qty 1

## 2022-03-04 MED ORDER — DELFLEX-LC/2.5% DEXTROSE 394 MOSM/L IP SOLN: INTRAPERITONEAL | Status: DC

## 2022-03-04 MED ORDER — HEPARIN SODIUM (PORCINE) 5000 UNIT/ML IJ SOLN
5000.0000 [IU] | Freq: Three times a day (TID) | INTRAMUSCULAR | Status: DC
  Administered 2022-03-04 – 2022-03-12 (×23): 5000 [IU] via SUBCUTANEOUS
  Filled 2022-03-04 (×23): qty 1

## 2022-03-04 MED ORDER — PANTOPRAZOLE SODIUM 40 MG IV SOLR
40.0000 mg | INTRAVENOUS | Status: DC
  Administered 2022-03-05 – 2022-03-06 (×2): 40 mg via INTRAVENOUS
  Filled 2022-03-04 (×2): qty 10

## 2022-03-04 MED ORDER — METHYLPREDNISOLONE SODIUM SUCC 40 MG IJ SOLR
40.0000 mg | Freq: Every day | INTRAMUSCULAR | Status: AC
  Administered 2022-03-05 – 2022-03-09 (×5): 40 mg via INTRAVENOUS
  Filled 2022-03-04 (×5): qty 1

## 2022-03-04 MED ORDER — ASPIRIN 81 MG PO CHEW
81.0000 mg | CHEWABLE_TABLET | Freq: Every day | ORAL | Status: DC
  Administered 2022-03-04 – 2022-03-06 (×3): 81 mg
  Filled 2022-03-04 (×3): qty 1

## 2022-03-04 MED ORDER — INSULIN ASPART 100 UNIT/ML IJ SOLN
1.0000 [IU] | INTRAMUSCULAR | Status: DC
  Administered 2022-03-04: 2 [IU] via SUBCUTANEOUS
  Administered 2022-03-04 – 2022-03-06 (×3): 1 [IU] via SUBCUTANEOUS
  Administered 2022-03-06: 2 [IU] via SUBCUTANEOUS
  Administered 2022-03-07: 3 [IU] via SUBCUTANEOUS
  Administered 2022-03-07 – 2022-03-08 (×2): 1 [IU] via SUBCUTANEOUS
  Administered 2022-03-08: 2 [IU] via SUBCUTANEOUS
  Administered 2022-03-08: 1 [IU] via SUBCUTANEOUS
  Administered 2022-03-08 – 2022-03-09 (×2): 2 [IU] via SUBCUTANEOUS
  Administered 2022-03-09: 3 [IU] via SUBCUTANEOUS
  Administered 2022-03-09: 2 [IU] via SUBCUTANEOUS
  Administered 2022-03-09 – 2022-03-10 (×3): 1 [IU] via SUBCUTANEOUS
  Administered 2022-03-10 (×2): 2 [IU] via SUBCUTANEOUS
  Administered 2022-03-11: 1 [IU] via SUBCUTANEOUS
  Administered 2022-03-11: 2 [IU] via SUBCUTANEOUS
  Administered 2022-03-11 – 2022-03-12 (×3): 1 [IU] via SUBCUTANEOUS
  Administered 2022-03-12: 3 [IU] via SUBCUTANEOUS
  Administered 2022-03-12: 1 [IU] via SUBCUTANEOUS
  Administered 2022-03-13: 2 [IU] via SUBCUTANEOUS
  Administered 2022-03-13: 3 [IU] via SUBCUTANEOUS
  Administered 2022-03-13 (×2): 2 [IU] via SUBCUTANEOUS
  Administered 2022-03-14 (×2): 1 [IU] via SUBCUTANEOUS
  Administered 2022-03-14 (×2): 2 [IU] via SUBCUTANEOUS

## 2022-03-04 NOTE — Progress Notes (Signed)
ANTICOAGULATION CONSULT NOTE  Pharmacy Consult for Heparin Indication: chest pain/ACS Brief A/P: Heparin level subtherapeutic Increase Heparin rate  Allergies  Allergen Reactions   Nsaids Other (See Comments)    Chronic kidney disease   Valacyclovir Nausea And Vomiting   Lisinopril Rash    Patient Measurements: Height: 6' (182.9 cm) Weight: 83.9 kg (184 lb 15.5 oz) IBW/kg (Calculated) : 77.6 Heparin Dosing Weight: 84 kg  Vital Signs: Temp: 97.8 F (36.6 C) (12/31 0400) Temp Source: Axillary (12/31 0400) BP: 100/57 (12/31 0330) Pulse Rate: 45 (12/31 0330)  Labs: Recent Labs    03/02/22 2153 03/02/22 2352 03/03/22 0133 03/03/22 2015 03/03/22 2047 03/04/22 0036 03/04/22 0220  HGB 10.0*  --  9.5* 8.2*  --   --  8.3*  HCT 29.2*  --  28.0* 24.0*  --   --  25.0*  PLT 323  --   --   --   --   --  360  HEPARINUNFRC  --   --   --   --   --   --  0.27*  CREATININE 6.08*  --   --   --   --   --   --   TROPONINIHS 690* 582*  --   --  825* 912*  --      Estimated Creatinine Clearance: 10.6 mL/min (A) (by C-G formula based on SCr of 6.08 mg/dL (H)).  Assessment: 81 y.o. male with SOB, elevated troponin and possible ACS for heparin   Goal of Therapy:  Heparin level 0.3-0.7 units/ml Monitor platelets by anticoagulation protocol: Yes   Plan:  Increase Heparin 1300 units/hr Check heparin level in 6 hours.   Phillis Knack, PharmD, BCPS 03/04/2022,4:35 AM

## 2022-03-04 NOTE — Progress Notes (Addendum)
ANTICOAGULATION CONSULT NOTE  Pharmacy Consult for Heparin Indication: chest pain/ACS  Allergies  Allergen Reactions   Nsaids Other (See Comments)    Chronic kidney disease   Valacyclovir Nausea And Vomiting   Lisinopril Rash    Patient Measurements: Height: 6' (182.9 cm) Weight: 86.6 kg (190 lb 14.7 oz) IBW/kg (Calculated) : 77.6 Heparin Dosing Weight: 84 kg  Vital Signs: Temp: 96.7 F (35.9 C) (12/31 1142) Temp Source: Axillary (12/31 1142) BP: 84/64 (12/31 1345) Pulse Rate: 53 (12/31 1345)  Labs: Recent Labs    03/02/22 2153 03/02/22 2352 03/03/22 2015 03/03/22 2047 03/04/22 0036 03/04/22 0220 03/04/22 1116 03/04/22 1127  HGB 10.0*   < > 8.2*  --   --  8.3* 9.5*  --   HCT 29.2*   < > 24.0*  --   --  25.0* 28.0*  --   PLT 323  --   --   --   --  360  --   --   HEPARINUNFRC  --   --   --   --   --  0.27*  --  0.44  CREATININE 6.08*  --   --   --   --  6.32*  --   --   TROPONINIHS 690*   < >  --  825* 912* 933*  --   --    < > = values in this interval not displayed.     Estimated Creatinine Clearance: 10.2 mL/min (A) (by C-G formula based on SCr of 6.32 mg/dL (H)).   Medical History: Past Medical History:  Diagnosis Date   Aortic insufficiency    ESRD on peritoneal dialysis ([place])    Hyperlipidemia    Hypertension     Medications:  Medications Prior to Admission  Medication Sig Dispense Refill Last Dose   amLODipine (NORVASC) 10 MG tablet Take 10 mg by mouth daily.   03/02/2022   doxazosin (CARDURA) 2 MG tablet Take 2 mg by mouth 2 (two) times daily.   03/02/2022   losartan (COZAAR) 50 MG tablet Take 50 mg by mouth daily.   03/02/2022   omeprazole (PRILOSEC) 20 MG capsule Take 20 mg by mouth daily. Reported on 02/16/2015   03/02/2022   torsemide (DEMADEX) 20 MG tablet Take 20 mg by mouth daily.   03/02/2022    Assessment: 81 y.o.M presents with PNA and SOB. Pt to begin heparin for ACS. Trop up to 582. Hgb 9.5. Noted pt with ESRD and on PD. Not on  AC PTA.  Heparin level this afternoon is therapeutic at 0.44 on 1300 units/hr.  No overt bleeding or complications noted.  Goal of Therapy:  Heparin level 0.3-0.7 units/ml Monitor platelets by anticoagulation protocol: Yes   Plan:  Discussed with Dr. Harl Bowie, no plans for cath lab, okay to stop heparin infusion.  Will switch to DVT prophylaxis dosing.  [place] Crane, Roylene Reason, BCCP Clinical Pharmacist  03/04/2022 1:58 PM   [facility] pharmacy phone numbers are listed on [email or web address]

## 2022-03-04 NOTE — Progress Notes (Signed)
  Echocardiogram 2D Echocardiogram has been performed.  William [name] 03/04/2022, 7:49 AM

## 2022-03-04 NOTE — Progress Notes (Signed)
[facility] Progress Note  Subjective: seen in ICU, on vent now  Vitals:   03/04/22 1027 03/04/22 1046 03/04/22 1100 03/04/22 1107  BP: 133/64 108/60 (!) 92/58   Pulse: 77 (!) 53 (!) 52   Resp: (!) 28 (!) 24 (!) 22   Temp:      TempSrc:      SpO2: 100% 100% 100% 100%  Weight:      Height:        Exam: Gen on vent, sedated No rash, cyanosis or gangrene Sclera anicteric, throat w/ ETT No jvd or bruits Chest clear anterior/ lateral RRR no MRG Abd soft ntnd no mass or ascites +bs GU normal MS no joint effusions or deformity Ext no LE edema, no wounds or ulcers Neuro is on vent, sedated   PD cath mid-abdomen w/ dressing in place    Home meds include - norvasc 10, cardura 2 bid, losartan 50 qd, prilosec, sod bicarb, torsemide 20 qd, prns      OP PD: f/b Dr Smitty Cords from [facility]. 4 exchanges overnight, 2500 cc dwell vol, uses mostly 1.5% and 2.5% bags.  On PD for 3 years. Dry wt 181- 185 per the pt.    Assessment/ Plan: PNA severe - pt worsened clinically and was intubated 12/30. Per CCM, on IV abx.  Hypotension -- on levo gtt 4-8 ug/min Vol - CVP 9-15, up 2-3kg, no edema on exam. Will use all 2.5% w/ PD tonight.  ESRD - on PD x 3 yrs, f/b [facility]. PD nightly while here.  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 Follow.  H/o HTN - holding home BP meds DNR - pt agreed to intubation    Rob Royale Lennartz 03/04/2022, 11:13 AM   Recent Labs  Lab 03/02/22 2153 03/03/22 0133 03/03/22 2015 03/04/22 0220  HGB 10.0*   < > 8.2* 8.3*  ALBUMIN 3.2*  --   --   --   CALCIUM 8.4*  --   --  8.0*  PHOS  --   --   --  6.2*  CREATININE 6.08*  --   --  6.32*  K 4.3   < > 5.2* 4.1   < > = values in this interval not displayed.   No results for input(s): "IRON", "TIBC", "FERRITIN" in the last 168 hours. Inpatient medications:  aspirin  81 mg Per Tube Daily   Chlorhexidine Gluconate Cloth  6 each Topical  Daily   docusate  100 mg Per Tube BID   famotidine  20 mg Per Tube Daily   fentaNYL (SUBLIMAZE) injection  25 mcg Intravenous Once   gentamicin cream  1 Application Topical Daily   [START ON 03/05/2022] methylPREDNISolone (SOLU-MEDROL) injection  40 mg Intravenous Daily   mouth rinse  15 mL Mouth Rinse Q2H   [START ON 03/05/2022] pantoprazole (PROTONIX) IV  40 mg Intravenous Q24H   polyethylene glycol  17 g Per Tube Daily   QUEtiapine  50 mg Per Tube BID   sodium chloride flush  10-40 mL Intracatheter Q12H   vancomycin variable dose per unstable renal function (pharmacist dosing)   Does not apply See admin instructions    sodium chloride Stopped (03/04/22 1031)   azithromycin Stopped (03/03/22 2302)   cefTRIAXone (ROCEPHIN)  IV Stopped (03/04/22 0018)   dialysis solution 4.25% low-MG/low-CA     fentaNYL infusion INTRAVENOUS 200 mcg/hr (03/04/22 1100)   heparin 1,300 Units/hr (03/04/22 1100)  insulin 1.4 Units/hr (03/04/22 1100)   norepinephrine (LEVOPHED) Adult infusion 5 mcg/min (03/04/22 1100)   sodium chloride, acetaminophen **OR** acetaminophen, dextrose, fentaNYL, midazolam, ondansetron **OR** ondansetron (ZOFRAN) IV, mouth rinse, sodium chloride flush

## 2022-03-04 NOTE — Progress Notes (Signed)
[place] Progress Note Patient Name: William [name] DOB: [birth year] MRN: [id number]   Date of Service  03/04/2022  HPI/Events of Note  Patient is bradycardic into the 40's on 0.2 mcg of Precedex gtt. Evolving EKG changes.  eICU Interventions  Precedex discontinued and PRN iv Versed substituted. Continue to trend troponin.        Ashlan Dignan U Destony Prevost 03/04/2022, 3:10 AM

## 2022-03-04 NOTE — Consult Note (Signed)
NAME:  William [name], MRN:  [id number], DOB:  01/09/1941, LOS: 1 ADMISSION DATE:  03/02/2022, CONSULTATION DATE:  03/03/22 REFERRING MD:  Loleta Books, CHIEF COMPLAINT:  SOB   History of Present Illness:  81 year old man with hx of CHF, ESRD on PD, HTN, HLD presenting with cough, DOE, intermittent sputum production.  Denies sick contacts.  +subjective fevers.  CXR showing RUL PNA.  O2 needs have escalated rapidly and CXR deteriorated.  PCCM consulted.   Pertinent  Medical History  ESRD on PD Aortic root dilation Mildly reduced EF HTN BPH  Interim History / Subjective:  Intubated yesterday for increasing respiratory distress.  Improving oxygenation but periods of agitation.  Objective   Blood pressure 106/61, pulse (!) 50, temperature (!) 96.7 F (35.9 C), temperature source Axillary, resp. rate (!) 28, height 6' (1.829 m), weight 86.6 kg, SpO2 100 %. CVP:  [9 mmHg-25 mmHg] 9 mmHg  Vent Mode: PRVC FiO2 (%):  [40 %-100 %] 40 % Set Rate:  [24 bmp] 24 bmp Vt Set:  [620 mL] 620 mL PEEP:  [10 cmH20] 10 cmH20 Plateau Pressure:  [21 cmH20-26 cmH20] 23 cmH20   Intake/Output Summary (Last 24 hours) at 03/04/2022 1258 Last data filed at 03/04/2022 1200 Gross per 24 hour  Intake 1868.41 ml  Output 845 ml  Net 1023.41 ml    Filed Weights   03/02/22 2107 03/03/22 1403 03/04/22 0447  Weight: 83.9 kg 83.9 kg 86.6 kg   Examination: General: Intubated on sedation no distress HEENT: ET tube OG tube in place.  Trachea midline Pulmonary: Bronchial breath sounds at right base.  No adventitial sounds.  Acceptable airway pressures.  FiO2 weaned to 50% CV: Heart sounds normal.  No edema.  Extremities warm well-perfused. Abdomen: Soft nontender.  PD catheter in place. Extremities: No mottling. GU: Foley catheter in place still producing small amount of urine.  ancillary tests personally reviewed:  Chest x-ray shows markedly asymmetric infiltrate affecting predominantly the right lung. ABG:  7.39/29.2/79  Assessment & Plan:   Critically ill due to acute hypoxemic respiratory failure secondary to severe R sided CAP, requiring mechanical ventilation -Full ventilatory support wean FiO2 and PEEP as tolerated. -CVP is 7 therefore unlikely to need diuresis -Likely will be ready for SBT's in the next 48 hours. -Will need to be relatively short course of mechanical ventilation if patient is to have best chance of recovery. -Patient is open to trial of mechanical ventilation but does not want to die on ventilator.  Community-acquired pneumonia -Continue empiric ceftriaxone and azithromycin pending culture results. -Course of adjunctive steroids.  Agitated delirium -Continue fentanyl infusion.  Became excessively bradycardic with dexmedetomidine -Continue as needed Versed -Have added Seroquel as benzodiazepine sparing agent.  ESRD on PD -Continue PD exchanges running even for now  Hx HTN -Antihypertensives on hold. -Titrate norepinephrine to keep MAP greater than 65, likely offsetting effects of sedation at this time.  Hx HFrEF Troponemia, type II response -No signs of cardiac decompensation.  No specific therapy at this time.  Best Practice (right click and "Reselect all SmartList Selections" daily)   Diet/type: NPO start tube feeds DVT prophylaxis: prophylactic heparin  GI prophylaxis: Pantoprazole Lines: Central line left subclavian Foley: In place Code Status:  DNR Last date of multidisciplinary goals of care discussion [family updated at the bedside.]  CRITICAL CARE Performed by: Kipp Brood   Total critical care time: 40 minutes  Critical care time was exclusive of separately billable procedures and treating other patients.  Critical  care was necessary to treat or prevent imminent or life-threatening deterioration.  Critical care was time spent personally by me on the following activities: development of treatment plan with patient and/or surrogate as  well as nursing, discussions with consultants, evaluation of patient's response to treatment, examination of patient, obtaining history from patient or surrogate, ordering and performing treatments and interventions, ordering and review of laboratory studies, ordering and review of radiographic studies, pulse oximetry, re-evaluation of patient's condition and participation in multidisciplinary rounds.  Kipp Brood, MD [facility] ICU Physician [facility]  Pager: [phone] Mobile: [phone] After hours: [phone].

## 2022-03-04 NOTE — Progress Notes (Signed)
[facility] Progress Note Patient Name: William [name] DOB: 1940-10-16 MRN: [id number]   Date of Service  03/04/2022  HPI/Events of Note  Blood sugar 374 mg / dl, Mg++ 1.7, Rising Troponin, Heart rate in the mid-forties but MAP still > 65.  eICU Interventions  Insulin gtt ordered, Mg++ sulfate 2 gm iv x 1, Levophed gtt increased by 2 mcg to hopefully bring the heart rate into the 50's range, cardiology consulted for rising Troponin concerning for acute coronary syndrome.        Kerry Kass [name] 03/04/2022, 5:13 AM

## 2022-03-04 NOTE — Consult Note (Signed)
Cardiology Consultation   Patient ID: William [name] MRN: [id number]; DOB: 10/26/1940  Admit date: 03/02/2022 Date of Consult: 03/04/2022  PCP:  [name], [place] [facility] Cardiologist:  New  Patient Profile:   William [name] is a 81 y.o. male with a hx of ESRD on PD, hypertension, hypothyroidism, hyperlipidemia, mild MR who is being seen 03/04/2022 for the evaluation of elevated troponin at the request of Dr. Tamala Julian.  History of Present Illness:   William [name] is an 81 year old male with past medical history noted above.  He was seen remotely by heart care, Dr. Martinique back in 2012.   Presented to the ED on 12/29 with complaints of increased shortness of breath, nausea vomiting and fevers.  In the ED labs showed sodium 129, potassium 4.3, creatinine 6, albumin 3.2, BNP 1301, high-sensitivity troponin 690>> 582>> 825>> 912>> 933, WBC 11.2, hemoglobin 10.  Chest x-ray concerning for right upper lobe pneumonia.  EKG showed sinus rhythm, 95 bpm, T wave inversion in inferior lateral leads.  Respiratory panel negative.  Patient was accepted by internal medicine with with plans to transfer to progressive unit.  Developed progressive hypoxic respiratory failure and was transitioned to BiPAP.  Repeat EKG 12/30 showed sinus tachycardia, 104 bpm, ST depression in lateral leads with slight elevation in aVR, LVH.   Transferred to to heart and ultimately intubated with progressive acute hypoxic respiratory failure.  He was placed on IV antibiotics.  Nephrology consulted.  Cardiology asked to evaluate in regards to elevated troponin along with EKG changes.  In talking with his wife at the bedside she reports he began feeling unwell Tuesday of last week.  Had nausea and vomiting.  Poor oral intake and no appetite.  He actually went to a local urgent care on Thursday but the wait time was prolonged and he did not want to wait.  Ultimately presented to [place] with symptoms.  She denies any sick contacts in the home.  Regards to his medical care, she denies any regular follow-up with cardiology.  Notes indicate he did have a stress test back in 2021 which was negative for ischemia, EF was reported at 48% with dilated left ventricle, coronary calcifications noted.  He has been followed by [facility] on the transplant list.   Past Medical History:  Diagnosis Date   Aortic insufficiency    ESRD on peritoneal dialysis ([place])    Hyperlipidemia    Hypertension     Past Surgical History:  Procedure Laterality Date   APPENDECTOMY     CARDIOVASCULAR STRESS TEST  06/08/2009   EF 43%   CHOLECYSTECTOMY     FOOT SURGERY     HERNIA REPAIR     US ECHOCARDIOGRAPHY  05/24/2009   EF 60%     Home Medications:  Prior to Admission medications   Medication Sig Start Date End Date Taking? Authorizing Provider  amLODipine (NORVASC) 10 MG tablet Take 10 mg by mouth daily. 08/08/15  Yes [provider]  doxazosin (CARDURA) 2 MG tablet Take 2 mg by mouth 2 (two) times daily. 05/04/19  Yes [provider]  losartan (COZAAR) 50 MG tablet Take 50 mg by mouth daily.   Yes [provider]  omeprazole (PRILOSEC) 20 MG capsule Take 20 mg by mouth daily. Reported on 02/16/2015   Yes [provider]  torsemide (DEMADEX) 20 MG tablet Take 20 mg by mouth daily. 07/18/17  Yes [provider]    Inpatient  Medications: Scheduled Meds:  Chlorhexidine Gluconate Cloth  6 each Topical Daily   docusate  100 mg Per Tube BID   famotidine  20 mg Per Tube Daily   fentaNYL (SUBLIMAZE) injection  25 mcg Intravenous Once   gentamicin cream  1 Application Topical Daily   methylPREDNISolone (SOLU-MEDROL) injection  125 mg Intravenous Daily   mouth rinse  15 mL Mouth Rinse Q2H   pantoprazole  40 mg Oral Daily   polyethylene glycol  17 g Per Tube Daily   sodium chloride flush  10-40 mL Intracatheter Q12H   vancomycin variable dose per unstable  renal function (pharmacist dosing)   Does not apply See admin instructions   Continuous Infusions:  sodium chloride 10 mL/hr at 03/04/22 0800   azithromycin Stopped (03/03/22 2302)   cefTRIAXone (ROCEPHIN)  IV Stopped (03/04/22 0018)   dialysis solution 4.25% low-MG/low-CA     fentaNYL infusion INTRAVENOUS 200 mcg/hr (03/04/22 0800)   heparin 1,300 Units/hr (03/04/22 0800)   insulin Stopped (03/04/22 0740)   norepinephrine (LEVOPHED) Adult infusion 7 mcg/min (03/04/22 0800)   PRN Meds: sodium chloride, acetaminophen **OR** acetaminophen, dextrose, fentaNYL, midazolam, ondansetron **OR** ondansetron (ZOFRAN) IV, mouth rinse, sodium chloride flush  Allergies:    Allergies  Allergen Reactions   Nsaids Other (See Comments)    Chronic kidney disease   Valacyclovir Nausea And Vomiting   Lisinopril Rash    Social History:   Social History   Socioeconomic History   Marital status: Married    Spouse name: Not on file   Number of children: 4   Years of education: Not on file   Highest education level: Not on file  Occupational History   Occupation: general Marine scientist: RETIRED  Tobacco Use   Smoking status: Former    Types: Cigarettes    Quit date: 03/05/1966    Years since quitting: 56.0   Smokeless tobacco: Never  Substance and Sexual Activity   Alcohol use: No   Drug use: No   Sexual activity: Not on file  Other Topics Concern   Not on file  Social History Narrative   Not on file   Social Determinants of Health   Financial Resource Strain: Not on file  Food Insecurity: Not on file  Transportation Needs: Not on file  Physical Activity: Not on file  Stress: Not on file  Social Connections: Not on file  Intimate Partner Violence: Not on file    Family History:    Family History  Problem Relation Age of Onset   Cancer Mother    Cirrhosis Father    Diabetes Brother      ROS:  Please see the history of present illness.   All other ROS reviewed and  negative.     Physical Exam/Data:   Vitals:   03/04/22 0447 03/04/22 0700 03/04/22 0759 03/04/22 0800  BP:  (!) 98/58  114/62  Pulse:  (!) 46 (!) 58 (!) 51  Resp:  (!) 24 (!) 34 (!) 24  Temp:   (!) 97 F (36.1 C)   TempSrc:   Axillary   SpO2:  100% 100% 100%  Weight: 86.6 kg     Height:        Intake/Output Summary (Last 24 hours) at 03/04/2022 0854 Last data filed at 03/04/2022 0800 Gross per 24 hour  Intake 1617.41 ml  Output 805 ml  Net 812.41 ml      03/04/2022    4:47 AM 03/03/2022    2:03  PM 03/02/2022    9:07 PM  Last 3 Weights  Weight (lbs) 190 lb 14.7 oz 184 lb 15.5 oz 184 lb 15.5 oz  Weight (kg) 86.6 kg 83.9 kg 83.9 kg     Body mass index is 25.89 kg/m.  General: Patient is intubated, does respond to verbal stimuli HEENT:  ETT in place Neck: no JVD Vascular: No carotid bruits; Distal pulses 2+ bilaterally Cardiac:  normal S1, S2; RRR; no murmur  Lungs: Diminished, coarse rhonchi Abd: soft, nontender, no hepatomegaly  Ext: no edema Musculoskeletal:  No deformities, BUE and BLE strength normal and equal Skin: warm and dry  Neuro:  CNs 2-12 intact, no focal abnormalities noted  EKG:  The EKG was personally reviewed and demonstrates:   12/29 sinus rhythm, 95 bpm, T wave inversion in inferior lateral leads 12/30 sinus tachycardia, 104 bpm, ST depression in lateral leads with slight elevation in aVR, LVH  Telemetry:  Telemetry was personally reviewed and demonstrates: Sinus tachycardia--> sinus rhythm--> sinus bradycardia with PVCs--> intermittent junctional rhythm--> sinus bradycardia  Relevant CV Studies:  Echo: 03/2011  Study Conclusions   - Left ventricle: The cavity size was normal. Systolic    function was mildly reduced. The estimated ejection    fraction was in the range of 45% to 50%. Doppler    parameters are consistent with abnormal left ventricular    relaxation (grade 1 diastolic dysfunction).  - Aortic valve: Mild regurgitation.  -  Mitral valve: Mild regurgitation.  - Left atrium: The atrium was moderately dilated.  - Right ventricle: The cavity size was mildly dilated. Wall    thickness was normal.  - Right atrium: The atrium was mildly dilated.  Echocardiography.  M-mode, complete 2D, spectral Doppler,  and color Doppler.  Height:  Height: 182.9cm. Height: 72in.  Weight:  Weight: 78kg. Weight: 171.6lb.  Body mass index:  BMI: 23.3kg/m^2.  Body surface area:    BSA: 47m2.  Blood  pressure:     163/91.  Patient status:  Outpatient.  [facility]   Echo: 03/04/2022  IMPRESSIONS     1. Left ventricular ejection fraction, by estimation, is 40 to 45%. The  left ventricle has mildly decreased function. The left ventricle  demonstrates global hypokinesis. There is mild left ventricular  hypertrophy. Left ventricular diastolic parameters  are consistent with Grade I diastolic dysfunction (impaired relaxation).   2. Right ventricular systolic function is mildly reduced. The right  ventricular size is normal. Tricuspid regurgitation signal is inadequate  for assessing PA pressure.   3. Moderate pleural effusion.   4. Mild mitral valve regurgitation.   5. Aortic valve regurgitation is mild.   6. The inferior vena cava is normal in size with <50% respiratory  variability, suggesting right atrial pressure of 8 mmHg.   Comparison(s): No prior Echocardiogram.   FINDINGS   Left Ventricle: Left ventricular ejection fraction, by estimation, is 40  to 45%. The left ventricle has mildly decreased function. The left  ventricle demonstrates global hypokinesis. The left ventricular internal  cavity size was normal in size. There is   mild left ventricular hypertrophy. Left ventricular diastolic parameters  are consistent with Grade I diastolic dysfunction (impaired relaxation).   Right Ventricle: The right ventricular size is normal. Right ventricular  systolic function is mildly reduced. Tricuspid  regurgitation signal is  inadequate for assessing PA pressure.   Left Atrium: Left atrial size was normal in size.   Right Atrium: Right atrial size was  normal in size.   Pericardium: There is no evidence of pericardial effusion.   Mitral Valve: Mild mitral valve regurgitation.   Tricuspid Valve: Tricuspid valve regurgitation is not demonstrated.   Aortic Valve: Aortic valve regurgitation is mild. Aortic regurgitation PHT  measures 775 msec. Aortic valve mean gradient measures 4.0 mmHg. Aortic  valve peak gradient measures 8.0 mmHg. Aortic valve area, by VTI measures  3.31 cm.   Pulmonic Valve: Pulmonic valve regurgitation is mild.   Aorta: The aortic root and ascending aorta are structurally normal, with  no evidence of dilitation.   Venous: The inferior vena cava is normal in size with less than 50%  respiratory variability, suggesting right atrial pressure of 8 mmHg.   IAS/Shunts: The interatrial septum was not well visualized.   Additional Comments: There is a moderate pleural effusion.       Laboratory Data:  High Sensitivity Troponin:   Recent Labs  Lab 03/02/22 2153 03/02/22 2352 03/03/22 2047 03/04/22 0036 03/04/22 0220  TROPONINIHS 690* 582* 825* 912* 933*     Chemistry Recent Labs  Lab 03/02/22 2153 03/03/22 0133 03/03/22 2015 03/04/22 0220  NA 129* 130* 127* 128*  K 4.3 4.3 5.2* 4.1  CL 96*  --   --  95*  CO2 20*  --   --  17*  GLUCOSE 120*  --   --  374*  BUN 59*  --   --  61*  CREATININE 6.08*  --   --  6.32*  CALCIUM 8.4*  --   --  8.0*  MG  --   --   --  1.7  GFRNONAA 9*  --   --  8*  ANIONGAP 13  --   --  16*    Recent Labs  Lab 03/02/22 2153  PROT 7.1  ALBUMIN 3.2*  AST 24  ALT 18  ALKPHOS 52  BILITOT 0.3   Lipids No results for input(s): "CHOL", "TRIG", "HDL", "LABVLDL", "LDLCALC", "CHOLHDL" in the last 168 hours.  Hematology Recent Labs  Lab 03/02/22 2153 03/03/22 0133 03/03/22 2015 03/04/22 0220  WBC 11.2*  --   --   12.7*  RBC 2.98*  --   --  2.46*  HGB 10.0* 9.5* 8.2* 8.3*  HCT 29.2* 28.0* 24.0* 25.0*  MCV 98.0  --   --  101.6*  MCH 33.6  --   --  33.7  MCHC 34.2  --   --  33.2  RDW 11.8  --   --  11.9  PLT 323  --   --  360   Thyroid No results for input(s): "TSH", "FREET4" in the last 168 hours.  BNP Recent Labs  Lab 03/02/22 2153  BNP 1,301.2*    DDimer No results for input(s): "DDIMER" in the last 168 hours.   Radiology/Studies:  ECHOCARDIOGRAM COMPLETE  Result Date: 03/04/2022    ECHOCARDIOGRAM REPORT   Patient Name:   William [name] Date of Exam: 03/04/2022 Medical Rec #:  [id number]      Height:       72.0 in Accession #:    [id number]     Weight:       190.9 lb Date of Birth:  01-22-1941     BSA:          2.089 m Patient Age:    26 years       BP:           98/58 mmHg Patient Gender: M  HR:           51 bpm. Exam Location:  Inpatient Procedure: 2D Echo, Cardiac Doppler and Color Doppler                       STAT ECHO Reported to: Dr Phineas Inches on 03/04/2022 7:47:00 AM. Indications:    Cardiomyopathy-ischemic  History:        Patient has prior history of Echocardiogram examinations, most                 recent 03/29/2011. Risk Factors:Hypertension and Dyslipidemia.                 ESRD.  Sonographer:    Clayton Lefort RDCS (AE) Referring Phys: Frederik Pear  Sonographer Comments: Echo performed with patient supine and on artificial respirator. IMPRESSIONS  1. Left ventricular ejection fraction, by estimation, is 40 to 45%. The left ventricle has mildly decreased function. The left ventricle demonstrates global hypokinesis. There is mild left ventricular hypertrophy. Left ventricular diastolic parameters are consistent with Grade I diastolic dysfunction (impaired relaxation).  2. Right ventricular systolic function is mildly reduced. The right ventricular size is normal. Tricuspid regurgitation signal is inadequate for assessing PA pressure.  3. Moderate pleural effusion.  4. Mild  mitral valve regurgitation.  5. Aortic valve regurgitation is mild.  6. The inferior vena cava is normal in size with <50% respiratory variability, suggesting right atrial pressure of 8 mmHg. Comparison(s): No prior Echocardiogram. FINDINGS  Left Ventricle: Left ventricular ejection fraction, by estimation, is 40 to 45%. The left ventricle has mildly decreased function. The left ventricle demonstrates global hypokinesis. The left ventricular internal cavity size was normal in size. There is  mild left ventricular hypertrophy. Left ventricular diastolic parameters are consistent with Grade I diastolic dysfunction (impaired relaxation). Right Ventricle: The right ventricular size is normal. Right ventricular systolic function is mildly reduced. Tricuspid regurgitation signal is inadequate for assessing PA pressure. Left Atrium: Left atrial size was normal in size. Right Atrium: Right atrial size was normal in size. Pericardium: There is no evidence of pericardial effusion. Mitral Valve: Mild mitral valve regurgitation. Tricuspid Valve: Tricuspid valve regurgitation is not demonstrated. Aortic Valve: Aortic valve regurgitation is mild. Aortic regurgitation PHT measures 775 msec. Aortic valve mean gradient measures 4.0 mmHg. Aortic valve peak gradient measures 8.0 mmHg. Aortic valve area, by VTI measures 3.31 cm. Pulmonic Valve: Pulmonic valve regurgitation is mild. Aorta: The aortic root and ascending aorta are structurally normal, with no evidence of dilitation. Venous: The inferior vena cava is normal in size with less than 50% respiratory variability, suggesting right atrial pressure of 8 mmHg. IAS/Shunts: The interatrial septum was not well visualized. Additional Comments: There is a moderate pleural effusion.  LEFT VENTRICLE PLAX 2D LVIDd:         6.50 cm      Diastology LVIDs:         4.90 cm      LV e' medial:    3.59 cm/s LV PW:         2.10 cm      LV E/e' medial:  17.0 LV IVS:        1.40 cm      LV e'  lateral:   5.44 cm/s LVOT diam:     2.50 cm      LV E/e' lateral: 11.2 LV SV:         95 LV SV Index:   45  LVOT Area:     4.91 cm  LV Volumes (MOD) LV vol d, MOD A2C: 258.0 ml LV vol d, MOD A4C: 186.0 ml LV vol s, MOD A2C: 139.0 ml LV vol s, MOD A4C: 112.0 ml LV SV MOD A2C:     119.0 ml LV SV MOD A4C:     186.0 ml LV SV MOD BP:      100.0 ml RIGHT VENTRICLE            IVC RV Basal diam:  3.20 cm    IVC diam: 2.10 cm RV S prime:     7.72 cm/s TAPSE (M-mode): 1.4 cm LEFT ATRIUM              Index        RIGHT ATRIUM           Index LA diam:        3.60 cm  1.72 cm/m   RA Area:     18.10 cm LA Vol (A2C):   101.0 ml 48.35 ml/m  RA Volume:   47.70 ml  22.84 ml/m LA Vol (A4C):   54.5 ml  26.09 ml/m LA Biplane Vol: 74.6 ml  35.71 ml/m  AORTIC VALVE AV Area (Vmax):    3.34 cm AV Area (Vmean):   3.54 cm AV Area (VTI):     3.31 cm AV Vmax:           141.00 cm/s AV Vmean:          87.000 cm/s AV VTI:            0.286 m AV Peak Grad:      8.0 mmHg AV Mean Grad:      4.0 mmHg LVOT Vmax:         95.90 cm/s LVOT Vmean:        62.700 cm/s LVOT VTI:          0.193 m LVOT/AV VTI ratio: 0.67 AI PHT:            775 msec  AORTA Ao Root diam: 3.50 cm Ao Asc diam:  3.80 cm MITRAL VALVE MV Area (PHT): 1.96 cm    SHUNTS MV Decel Time: 388 msec    Systemic VTI:  0.19 m MV E velocity: 61.20 cm/s  Systemic Diam: 2.50 cm Phineas Inches Electronically signed by Phineas Inches Signature Date/Time: 03/04/2022/8:04:15 AM    Final    DG Abd 1 View  Result Date: 03/03/2022 CLINICAL DATA:  Orogastric tube placement EXAM: ABDOMEN - 1 VIEW COMPARISON:  08/30/2021 FINDINGS: Distal course of orogastric tube is coiled within the fundus of the stomach. Tip of enteric tube is seen in the region of antrum of the stomach. There are surgical clips in right upper quadrant. Bowel gas pattern in the upper abdomen is unremarkable. Lower abdomen and pelvis are not included in the image. There is a catheter in the mid abdomen suggesting possible peritoneal  dialysis catheter. IMPRESSION: Tip of enteric tube is seen in the antrum of the stomach. Electronically Signed   By: Elmer Picker M.D.   On: 03/03/2022 18:48   DG CHEST PORT 1 VIEW  Result Date: 03/03/2022 CLINICAL DATA:  Evaluate endotracheal tube and enteric tube location EXAM: PORTABLE CHEST 1 VIEW COMPARISON:  Previous studies including the examination done earlier today FINDINGS: Transverse diameter of heart is increased. Tip of endotracheal tube is 5.6 cm above the carina. There is interval placement of left IJ central venous catheter with its  tip in superior vena cava. Enteric tube is noted traversing the esophagus. There is large alveolar infiltrate in right upper lung field with possible interval worsening. There are faint alveolar densities in both parahilar regions and lower lung fields suggesting multifocal pneumonia and possibly pulmonary edema. There is blunting of lateral CP angles. There is no pneumothorax. IMPRESSION: Dense alveolar infiltrate is seen in right upper lobe. There are alveolar densities in both parahilar regions and both lower lung fields with possible interval worsening suggesting multifocal pneumonia and possibly pulmonary edema. Small bilateral pleural effusions. Support devices as described in the body of the report. Electronically Signed   By: Elmer Picker M.D.   On: 03/03/2022 18:46   DG CHEST PORT 1 VIEW  Result Date: 03/03/2022 CLINICAL DATA:  Shortness of breath and cough. EXAM: PORTABLE CHEST 1 VIEW COMPARISON:  12/31/2021 FINDINGS: RIGHT UPPER lobe consolidation/airspace opacities noted, stable to slightly increased. New bilateral airspace opacities within the remainder of the lungs noted and may represent pneumonia and/or edema. There may be trace bilateral pleural effusions present. Cardiomediastinal silhouette is not significantly changed. Probable bibasilar atelectasis noted. IMPRESSION: 1. RIGHT UPPER lobe consolidation/airspace opacities, likely  representing pneumonia. 2. New bilateral airspace opacities within the remainder of the lungs which may represent pneumonia and/or edema. Electronically Signed   By: Margarette Canada M.D.   On: 03/03/2022 15:05   DG Chest 2 View  Result Date: 03/02/2022 CLINICAL DATA:  Dyspnea EXAM: CHEST - 2 VIEW COMPARISON:  03/11/2021 FINDINGS: There is consolidation within the right upper lobe in keeping with changes of acute lobar pneumonia in the appropriate clinical setting. Left lung is clear. No pneumothorax or pleural effusion. Cardiac size within normal limits. No acute bone abnormality. IMPRESSION: 1. Right upper lobe pneumonia. Follow-up chest radiograph is recommended in 3-4 weeks to document resolution. Electronically Signed   By: Fidela Salisbury M.D.   On: 03/02/2022 22:00     Assessment and Plan:   William [name] is a 81 y.o. male with a hx of ESRD on PD, hypertension, hypothyroidism, hyperlipidemia, mild MR who is being seen 03/04/2022 for the evaluation of elevated troponin at the request of Dr. Tamala Julian.  Non-STEMI (suspect type II) Abnormal EKG -- Presented with progressive dyspnea, nausea and vomiting with poor p.o. intake.  High-sensitivity troponin noted at 690>> 582>> 825>> 912>> 933.  EKG with T wave inversion as well as ST depression in lateral leads.  Given his acute illness as well as comorbidities would continue to treat medically at this time -- Continue IV heparin, add ASA -- given hypotension and bradycardia unable to add additional therapy at this time  Acute hypoxic respiratory failure Pneumonia -- Initially admitted to internal medicine, now transition to PCCM as the patient required intubation -- Antibiotics per primary -- on IV pressor support  HFrEF -- Echo this admission with LVEF of 40 to 45%, global hypokinesis, grade 1 diastolic dysfunction, normal RV size with mildly reduced function, mild MR -- Etiology unclear though suspect could be NICM in the setting of acute illness.  Coronary anatomy is unknown -- current requiring IV pressors therefore unable to add addition therapy   HLD -- hx of the same, but has apparently been managed through lifestyle alone -- Check lipids  ESRD on PD -- Nephrology following  Risk Assessment/Risk Scores:   The patient's TIMI risk score is 3, which indicates a 13% risk of all cause mortality, new or recurrent myocardial infarction or need for urgent revascularization in the next 14  days.     For questions or updates, please contact [place] Please consult [email or web address] for contact info under    Signed, Reino Bellis, NP  03/04/2022 8:54 AM

## 2022-03-05 ENCOUNTER — Inpatient Hospital Stay (HOSPITAL_COMMUNITY): Payer: PPO

## 2022-03-05 DIAGNOSIS — I5021 Acute systolic (congestive) heart failure: Secondary | ICD-10-CM

## 2022-03-05 DIAGNOSIS — I214 Non-ST elevation (NSTEMI) myocardial infarction: Secondary | ICD-10-CM | POA: Diagnosis not present

## 2022-03-05 DIAGNOSIS — J9601 Acute respiratory failure with hypoxia: Secondary | ICD-10-CM | POA: Diagnosis not present

## 2022-03-05 DIAGNOSIS — N186 End stage renal disease: Secondary | ICD-10-CM

## 2022-03-05 DIAGNOSIS — J189 Pneumonia, unspecified organism: Secondary | ICD-10-CM | POA: Diagnosis not present

## 2022-03-05 LAB — GLUCOSE, CAPILLARY
Glucose-Capillary: 102 mg/dL — ABNORMAL HIGH (ref 70–99)
Glucose-Capillary: 108 mg/dL — ABNORMAL HIGH (ref 70–99)
Glucose-Capillary: 109 mg/dL — ABNORMAL HIGH (ref 70–99)
Glucose-Capillary: 123 mg/dL — ABNORMAL HIGH (ref 70–99)
Glucose-Capillary: 123 mg/dL — ABNORMAL HIGH (ref 70–99)
Glucose-Capillary: 96 mg/dL (ref 70–99)

## 2022-03-05 LAB — BASIC METABOLIC PANEL
Anion gap: 14 (ref 5–15)
BUN: 65 mg/dL — ABNORMAL HIGH (ref 8–23)
CO2: 22 mmol/L (ref 22–32)
Calcium: 8.2 mg/dL — ABNORMAL LOW (ref 8.9–10.3)
Chloride: 99 mmol/L (ref 98–111)
Creatinine, Ser: 6.68 mg/dL — ABNORMAL HIGH (ref 0.61–1.24)
GFR, Estimated: 8 mL/min — ABNORMAL LOW (ref >60)
Glucose, Bld: 104 mg/dL — ABNORMAL HIGH (ref 70–99)
Potassium: 4 mmol/L (ref 3.5–5.1)
Sodium: 135 mmol/L (ref 135–145)

## 2022-03-05 LAB — CBC
HCT: 24.3 % — ABNORMAL LOW (ref 39.0–52.0)
Hemoglobin: 8 g/dL — ABNORMAL LOW (ref 13.0–17.0)
MCH: 33.5 pg (ref 26.0–34.0)
MCHC: 32.9 g/dL (ref 30.0–36.0)
MCV: 101.7 fL — ABNORMAL HIGH (ref 80.0–100.0)
Platelets: 330 10*3/uL (ref 150–400)
RBC: 2.39 MIL/uL — ABNORMAL LOW (ref 4.22–5.81)
RDW: 12 % (ref 11.5–15.5)
WBC: 15.5 10*3/uL — ABNORMAL HIGH (ref 4.0–10.5)
nRBC: 0 % (ref 0.0–0.2)

## 2022-03-05 LAB — PHOSPHORUS: Phosphorus: 6.5 mg/dL — ABNORMAL HIGH (ref 2.5–4.6)

## 2022-03-05 LAB — MAGNESIUM: Magnesium: 2.4 mg/dL (ref 1.7–2.4)

## 2022-03-05 MED ORDER — GENTAMICIN SULFATE 0.1 % EX CREA
1.0000 | TOPICAL_CREAM | Freq: Every day | CUTANEOUS | Status: DC
  Filled 2022-03-05: qty 15

## 2022-03-05 MED ORDER — WHITE PETROLATUM EX OINT: TOPICAL_OINTMENT | CUTANEOUS | Status: DC | PRN

## 2022-03-05 MED ORDER — DARBEPOETIN ALFA 200 MCG/0.4ML IJ SOSY
200.0000 ug | PREFILLED_SYRINGE | INTRAMUSCULAR | Status: DC
  Administered 2022-03-05: 200 ug via SUBCUTANEOUS
  Filled 2022-03-05 (×2): qty 0.4

## 2022-03-05 MED ORDER — ORAL CARE MOUTH RINSE
15.0000 mL | OROMUCOSAL | Status: DC
  Administered 2022-03-05 (×3): 15 mL via OROMUCOSAL

## 2022-03-05 MED ORDER — ORAL CARE MOUTH RINSE: 15.0000 mL | OROMUCOSAL | Status: DC | PRN

## 2022-03-05 MED ORDER — DELFLEX-LC/1.5% DEXTROSE 344 MOSM/L IP SOLN: INTRAPERITONEAL | Status: DC

## 2022-03-05 NOTE — Procedures (Signed)
PD Note  Dressing change done using gentamicin cream.  Site without signs or symptoms of infection.

## 2022-03-05 NOTE — Procedures (Signed)
Extubation Procedure Note  Patient Details:   Name: William [name] DOB: 08/27/1940 MRN: [id number]   Airway Documentation:    Vent end date: (not recorded) Vent end time: (not recorded)   Evaluation  O2 sats: stable throughout Complications: No apparent complications Patient did tolerate procedure well. Bilateral Breath Sounds: Clear, Diminished   Yes  Patient was extubated to a 4L [place] without any complications, dyspnea or stridor noted. Positive cuff leak prior to extubation.   Claretta Fraise 03/05/2022, 9:32 AM

## 2022-03-05 NOTE — Progress Notes (Signed)
Subjective:  Remains on the vent-  completed PD-  UF was 542 Objective Vital signs in last 24 hours: Vitals:   03/05/22 0545 03/05/22 0600 03/05/22 0615 03/05/22 0700  BP: 115/61 115/63 118/61 136/73  Pulse: (!) 56 60 (!) 56 68  Resp: 17 (!) 26 18 (!) 24  Temp:      TempSrc:      SpO2: 100% 100% 100% 100%  Weight:      Height:       Weight change: 2 kg  Intake/Output Summary (Last 24 hours) at 03/05/2022 0757 Last data filed at 03/05/2022 2947 Gross per 24 hour  Intake 1166.88 ml  Output 742 ml  Net 424.88 ml    OP PD: f/b Dr Smitty Cords from [facility]. 4 exchanges overnight, 2500 cc dwell vol, uses mostly 1.5% and 2.5% bags.  On PD for 3 years. Dry wt 181- 185 per the pt.    Assessment/ Plan: Pt is a 82 y.o. yo male with ESRD on PD who was admitted on 03/02/2022 with  PNA but also felt to have NSTEMI Assessment/Plan: 1. PNA-  now on vent-  rocephin/axithro/vanc/steroids-  per CCM 2. ESRD - normally on PD at home - have been doing his usual regimen-  PD cath appears to be in good position 3. Anemia-  hgb low-  will give ESA 4. Secondary hyperparathyroidism-  not on any bone meds at present-  calc OK-  phos is fine for situation  5. HTN/volume-  normally on 3 BP meds plus diuretic-  he does make urine-   CVP is adequate-  using all 2.5 % bags here plus occasional lasix-  sodium is better so I dont think volume is a big issue at this time -  will use 1.5% bags 6. CV-  though to have NSTEMI-  cards involved-  bradycardia resolved-  supportive care   Louis Meckel    Labs: Basic Metabolic Panel: Recent Labs  Lab 03/02/22 2153 03/03/22 0133 03/04/22 0220 03/04/22 1116 03/05/22 0416  NA 129*   < > 128* 132* 135  K 4.3   < > 4.1 3.9 4.0  CL 96*  --  95*  --  99  CO2 20*  --  17*  --  22  GLUCOSE 120*  --  374*  --  104*  BUN 59*  --  61*  --  65*  CREATININE 6.08*  --  6.32*  --  6.68*  CALCIUM 8.4*  --  8.0*  --  8.2*  PHOS  --   --  6.2*  --  6.5*   < > =  values in this interval not displayed.   Liver Function Tests: Recent Labs  Lab 03/02/22 2153  AST 24  ALT 18  ALKPHOS 52  BILITOT 0.3  PROT 7.1  ALBUMIN 3.2*   Recent Labs  Lab 03/02/22 2153  LIPASE 26   No results for input(s): "AMMONIA" in the last 168 hours. CBC: Recent Labs  Lab 03/02/22 2153 03/03/22 0133 03/04/22 0220 03/04/22 1116 03/05/22 0416  WBC 11.2*  --  12.7*  --  15.5*  NEUTROABS 9.8*  --   --   --   --   HGB 10.0*   < > 8.3* 9.5* 8.0*  HCT 29.2*   < > 25.0* 28.0* 24.3*  MCV 98.0  --  101.6*  --  101.7*  PLT 323  --  360  --  330   < > = values in  this interval not displayed.   Cardiac Enzymes: No results for input(s): "CKTOTAL", "CKMB", "CKMBINDEX", "TROPONINI" in the last 168 hours. CBG: Recent Labs  Lab 03/04/22 1430 03/04/22 1518 03/04/22 1957 03/04/22 2334 03/05/22 0423  GLUCAP 134* 128* 185* 113* 102*    Iron Studies: No results for input(s): "IRON", "TIBC", "TRANSFERRIN", "FERRITIN" in the last 72 hours. Studies/Results: DG Abd 1 View  Result Date: 03/04/2022 CLINICAL DATA:  Peritoneal dialysis EXAM: ABDOMEN - 1 VIEW COMPARISON:  03/03/2022 FINDINGS: Enteric tube is coiled within the stomach. Tip of the peritoneal dialysis catheter is coiled within the low right pelvis. Nonobstructive bowel gas pattern. Cholecystectomy clips. Calcified splenic artery. IMPRESSION: Tip of the peritoneal dialysis catheter is coiled within the low right pelvis. Electronically Signed   By: Davina Poke D.O.   On: 03/04/2022 14:12   DG Chest Port 1 View  Result Date: 03/04/2022 CLINICAL DATA:  Endotracheal tube in-situ. EXAM: PORTABLE CHEST 1 VIEW COMPARISON:  One-view chest x-ray 03/03/2022 FINDINGS: Heart is enlarged. Atherosclerotic changes are present the aortic arch. Endotracheal tube is stable, 5 cm above the carina. Left IJ line terminates at the cavoatrial junction. Diffuse interstitial and airspace opacities are present throughout the right lung,  increasing in the right lower lobe. Perihilar opacities bilaterally are similar the prior study. Retrocardiac opacity is increasing. Bilateral pleural effusions are increasing. IMPRESSION: 1. Increasing interstitial and airspace opacities bilaterally consistent with multifocal pneumonia and probable pulmonary edema. 2. Increasing bilateral effusions. 3. Stable support apparatus. Electronically Signed   By: San Morelle M.D.   On: 03/04/2022 09:53   ECHOCARDIOGRAM COMPLETE  Result Date: 03/04/2022    ECHOCARDIOGRAM REPORT   Patient Name:   William [name] Date of Exam: 03/04/2022 Medical Rec #:  [id number]      Height:       72.0 in Accession #:    [id number]     Weight:       190.9 lb Date of Birth:  07/30/1940     BSA:          2.089 m Patient Age:    10 years       BP:           98/58 mmHg Patient Gender: M              HR:           51 bpm. Exam Location:  Inpatient Procedure: 2D Echo, Cardiac Doppler and Color Doppler                       STAT ECHO Reported to: Dr Phineas Inches on 03/04/2022 7:47:00 AM. Indications:    Cardiomyopathy-ischemic  History:        Patient has prior history of Echocardiogram examinations, most                 recent 03/29/2011. Risk Factors:Hypertension and Dyslipidemia.                 ESRD.  Sonographer:    Clayton Lefort RDCS (AE) Referring Phys: Frederik Pear  Sonographer Comments: Echo performed with patient supine and on artificial respirator. IMPRESSIONS  1. Left ventricular ejection fraction, by estimation, is 40 to 45%. The left ventricle has mildly decreased function. The left ventricle demonstrates global hypokinesis. There is mild left ventricular hypertrophy. Left ventricular diastolic parameters are consistent with Grade I diastolic dysfunction (impaired relaxation).  2. Right ventricular systolic function is mildly reduced. The right ventricular size is  normal. Tricuspid regurgitation signal is inadequate for assessing PA pressure.  3. Moderate pleural  effusion.  4. Mild mitral valve regurgitation.  5. Aortic valve regurgitation is mild.  6. The inferior vena cava is normal in size with <50% respiratory variability, suggesting right atrial pressure of 8 mmHg. Comparison(s): No prior Echocardiogram. FINDINGS  Left Ventricle: Left ventricular ejection fraction, by estimation, is 40 to 45%. The left ventricle has mildly decreased function. The left ventricle demonstrates global hypokinesis. The left ventricular internal cavity size was normal in size. There is  mild left ventricular hypertrophy. Left ventricular diastolic parameters are consistent with Grade I diastolic dysfunction (impaired relaxation). Right Ventricle: The right ventricular size is normal. Right ventricular systolic function is mildly reduced. Tricuspid regurgitation signal is inadequate for assessing PA pressure. Left Atrium: Left atrial size was normal in size. Right Atrium: Right atrial size was normal in size. Pericardium: There is no evidence of pericardial effusion. Mitral Valve: Mild mitral valve regurgitation. Tricuspid Valve: Tricuspid valve regurgitation is not demonstrated. Aortic Valve: Aortic valve regurgitation is mild. Aortic regurgitation PHT measures 775 msec. Aortic valve mean gradient measures 4.0 mmHg. Aortic valve peak gradient measures 8.0 mmHg. Aortic valve area, by VTI measures 3.31 cm. Pulmonic Valve: Pulmonic valve regurgitation is mild. Aorta: The aortic root and ascending aorta are structurally normal, with no evidence of dilitation. Venous: The inferior vena cava is normal in size with less than 50% respiratory variability, suggesting right atrial pressure of 8 mmHg. IAS/Shunts: The interatrial septum was not well visualized. Additional Comments: There is a moderate pleural effusion.  LEFT VENTRICLE PLAX 2D LVIDd:         6.50 cm      Diastology LVIDs:         4.90 cm      LV e' medial:    3.59 cm/s LV PW:         2.10 cm      LV E/e' medial:  17.0 LV IVS:        1.40  cm      LV e' lateral:   5.44 cm/s LVOT diam:     2.50 cm      LV E/e' lateral: 11.2 LV SV:         95 LV SV Index:   45 LVOT Area:     4.91 cm  LV Volumes (MOD) LV vol d, MOD A2C: 258.0 ml LV vol d, MOD A4C: 186.0 ml LV vol s, MOD A2C: 139.0 ml LV vol s, MOD A4C: 112.0 ml LV SV MOD A2C:     119.0 ml LV SV MOD A4C:     186.0 ml LV SV MOD BP:      100.0 ml RIGHT VENTRICLE            IVC RV Basal diam:  3.20 cm    IVC diam: 2.10 cm RV S prime:     7.72 cm/s TAPSE (M-mode): 1.4 cm LEFT ATRIUM              Index        RIGHT ATRIUM           Index LA diam:        3.60 cm  1.72 cm/m   RA Area:     18.10 cm LA Vol (A2C):   101.0 ml 48.35 ml/m  RA Volume:   47.70 ml  22.84 ml/m LA Vol (A4C):   54.5 ml  26.09 ml/m LA Biplane Vol: 74.6  ml  35.71 ml/m  AORTIC VALVE AV Area (Vmax):    3.34 cm AV Area (Vmean):   3.54 cm AV Area (VTI):     3.31 cm AV Vmax:           141.00 cm/s AV Vmean:          87.000 cm/s AV VTI:            0.286 m AV Peak Grad:      8.0 mmHg AV Mean Grad:      4.0 mmHg LVOT Vmax:         95.90 cm/s LVOT Vmean:        62.700 cm/s LVOT VTI:          0.193 m LVOT/AV VTI ratio: 0.67 AI PHT:            775 msec  AORTA Ao Root diam: 3.50 cm Ao Asc diam:  3.80 cm MITRAL VALVE MV Area (PHT): 1.96 cm    SHUNTS MV Decel Time: 388 msec    Systemic VTI:  0.19 m MV E velocity: 61.20 cm/s  Systemic Diam: 2.50 cm Phineas Inches Electronically signed by Phineas Inches Signature Date/Time: 03/04/2022/8:04:15 AM    Final    DG Abd 1 View  Result Date: 03/03/2022 CLINICAL DATA:  Orogastric tube placement EXAM: ABDOMEN - 1 VIEW COMPARISON:  08/30/2021 FINDINGS: Distal course of orogastric tube is coiled within the fundus of the stomach. Tip of enteric tube is seen in the region of antrum of the stomach. There are surgical clips in right upper quadrant. Bowel gas pattern in the upper abdomen is unremarkable. Lower abdomen and pelvis are not included in the image. There is a catheter in the mid abdomen suggesting  possible peritoneal dialysis catheter. IMPRESSION: Tip of enteric tube is seen in the antrum of the stomach. Electronically Signed   By: Elmer Picker M.D.   On: 03/03/2022 18:48   DG CHEST PORT 1 VIEW  Result Date: 03/03/2022 CLINICAL DATA:  Evaluate endotracheal tube and enteric tube location EXAM: PORTABLE CHEST 1 VIEW COMPARISON:  Previous studies including the examination done earlier today FINDINGS: Transverse diameter of heart is increased. Tip of endotracheal tube is 5.6 cm above the carina. There is interval placement of left IJ central venous catheter with its tip in superior vena cava. Enteric tube is noted traversing the esophagus. There is large alveolar infiltrate in right upper lung field with possible interval worsening. There are faint alveolar densities in both parahilar regions and lower lung fields suggesting multifocal pneumonia and possibly pulmonary edema. There is blunting of lateral CP angles. There is no pneumothorax. IMPRESSION: Dense alveolar infiltrate is seen in right upper lobe. There are alveolar densities in both parahilar regions and both lower lung fields with possible interval worsening suggesting multifocal pneumonia and possibly pulmonary edema. Small bilateral pleural effusions. Support devices as described in the body of the report. Electronically Signed   By: Elmer Picker M.D.   On: 03/03/2022 18:46   DG CHEST PORT 1 VIEW  Result Date: 03/03/2022 CLINICAL DATA:  Shortness of breath and cough. EXAM: PORTABLE CHEST 1 VIEW COMPARISON:  12/31/2021 FINDINGS: RIGHT UPPER lobe consolidation/airspace opacities noted, stable to slightly increased. New bilateral airspace opacities within the remainder of the lungs noted and may represent pneumonia and/or edema. There may be trace bilateral pleural effusions present. Cardiomediastinal silhouette is not significantly changed. Probable bibasilar atelectasis noted. IMPRESSION: 1. RIGHT UPPER lobe  consolidation/airspace opacities, likely representing pneumonia. 2.  New bilateral airspace opacities within the remainder of the lungs which may represent pneumonia and/or edema. Electronically Signed   By: Margarette Canada M.D.   On: 03/03/2022 15:05   Medications: Infusions:  sodium chloride Stopped (03/04/22 1031)   azithromycin Stopped (03/04/22 2006)   cefTRIAXone (ROCEPHIN)  IV Stopped (03/04/22 2314)   dialysis solution 2.5% low-MG/low-CA     fentaNYL infusion INTRAVENOUS 200 mcg/hr (03/05/22 0700)   insulin Stopped (03/04/22 1519)   norepinephrine (LEVOPHED) Adult infusion 1 mcg/min (03/05/22 0703)    Scheduled Medications:  aspirin  81 mg Per Tube Daily   Chlorhexidine Gluconate Cloth  6 each Topical Daily   docusate  100 mg Per Tube BID   famotidine  20 mg Per Tube Daily   fentaNYL (SUBLIMAZE) injection  25 mcg Intravenous Once   gentamicin cream  1 Application Topical Daily   heparin injection (subcutaneous)  5,000 Units Subcutaneous Q8H   insulin aspart  1-3 Units Subcutaneous Q4H   insulin detemir  5 Units Subcutaneous Q12H   methylPREDNISolone (SOLU-MEDROL) injection  40 mg Intravenous Daily   mouth rinse  15 mL Mouth Rinse Q2H   pantoprazole (PROTONIX) IV  40 mg Intravenous Q24H   polyethylene glycol  17 g Per Tube Daily   QUEtiapine  50 mg Per Tube BID   sodium chloride flush  10-40 mL Intracatheter Q12H   vancomycin variable dose per unstable renal function (pharmacist dosing)   Does not apply See admin instructions    have reviewed scheduled and prn medications.  Physical Exam: General: alert on vent but with mittens on  Heart: RRR Lungs: CBS bilat Abdomen: distended-  non tender-  PD cath non tender Extremities: no edema  Dialysis Access: PD cath     03/05/2022,7:57 AM  LOS: 2 days

## 2022-03-05 NOTE — Progress Notes (Signed)
Rounding Note    Patient Name: William [name] Date of Encounter: 03/05/2022  [place] Cardiologist: None   Subjective   Patient awake on vent. Denies chest pain.  Inpatient Medications    Scheduled Meds:  aspirin  81 mg Per Tube Daily   Chlorhexidine Gluconate Cloth  6 each Topical Daily   docusate  100 mg Per Tube BID   famotidine  20 mg Per Tube Daily   fentaNYL (SUBLIMAZE) injection  25 mcg Intravenous Once   gentamicin cream  1 Application Topical Daily   heparin injection (subcutaneous)  5,000 Units Subcutaneous Q8H   insulin aspart  1-3 Units Subcutaneous Q4H   insulin detemir  5 Units Subcutaneous Q12H   methylPREDNISolone (SOLU-MEDROL) injection  40 mg Intravenous Daily   mouth rinse  15 mL Mouth Rinse Q2H   pantoprazole (PROTONIX) IV  40 mg Intravenous Q24H   polyethylene glycol  17 g Per Tube Daily   QUEtiapine  50 mg Per Tube BID   sodium chloride flush  10-40 mL Intracatheter Q12H   vancomycin variable dose per unstable renal function (pharmacist dosing)   Does not apply See admin instructions   Continuous Infusions:  sodium chloride Stopped (03/04/22 1031)   azithromycin Stopped (03/04/22 2006)   cefTRIAXone (ROCEPHIN)  IV Stopped (03/04/22 2314)   dialysis solution 2.5% low-MG/low-CA     fentaNYL infusion INTRAVENOUS 200 mcg/hr (03/05/22 0700)   insulin Stopped (03/04/22 1519)   norepinephrine (LEVOPHED) Adult infusion 1 mcg/min (03/05/22 0703)   PRN Meds: sodium chloride, acetaminophen **OR** acetaminophen, dextrose, fentaNYL, midazolam, ondansetron **OR** ondansetron (ZOFRAN) IV, mouth rinse, sodium chloride flush   Vital Signs    Vitals:   03/05/22 0545 03/05/22 0600 03/05/22 0615 03/05/22 0700  BP: 115/61 115/63 118/61 136/73  Pulse: (!) 56 60 (!) 56 68  Resp: 17 (!) 26 18 (!) 24  Temp:      TempSrc:      SpO2: 100% 100% 100% 100%  Weight:      Height:        Intake/Output Summary (Last 24 hours) at 03/05/2022 0709 Last data  filed at 03/05/2022 0703 Gross per 24 hour  Intake 1166.88 ml  Output 200 ml  Net 966.88 ml      03/05/2022    5:00 AM 03/04/2022    4:47 AM 03/03/2022    2:03 PM  Last 3 Weights  Weight (lbs) 189 lb 6 oz 190 lb 14.7 oz 184 lb 15.5 oz  Weight (kg) 85.9 kg 86.6 kg 83.9 kg      Telemetry    NSR with occ PVC - Personally Reviewed  ECG    12/31: sinus tachy rate 104. Lateral ST depression - Personally Reviewed  Physical Exam    GEN: No acute distress. ETT in place   Neck: No JVD Cardiac: RRR, no murmurs, rubs, or gallops.  Respiratory: coarse rhonchi. Diminished BS in bases. Peritoneal catheter in place GI: Soft, nontender, non-distended  MS: No edema; No deformity. Neuro:  Nonfocal  Psych: Normal affect   Labs    High Sensitivity Troponin:   Recent Labs  Lab 03/02/22 2153 03/02/22 2352 03/03/22 2047 03/04/22 0036 03/04/22 0220  TROPONINIHS 690* 582* 825* 912* 933*     Chemistry Recent Labs  Lab 03/02/22 2153 03/03/22 0133 03/04/22 0220 03/04/22 1116 03/05/22 0416  NA 129*   < > 128* 132* 135  K 4.3   < > 4.1 3.9 4.0  CL 96*  --  95*  --  99  CO2 20*  --  17*  --  22  GLUCOSE 120*  --  374*  --  104*  BUN 59*  --  61*  --  65*  CREATININE 6.08*  --  6.32*  --  6.68*  CALCIUM 8.4*  --  8.0*  --  8.2*  MG  --   --  1.7  --  2.4  PROT 7.1  --   --   --   --   ALBUMIN 3.2*  --   --   --   --   AST 24  --   --   --   --   ALT 18  --   --   --   --   ALKPHOS 52  --   --   --   --   BILITOT 0.3  --   --   --   --   GFRNONAA 9*  --  8*  --  8*  ANIONGAP 13  --  16*  --  14   < > = values in this interval not displayed.    Lipids No results for input(s): "CHOL", "TRIG", "HDL", "LABVLDL", "LDLCALC", "CHOLHDL" in the last 168 hours.  Hematology Recent Labs  Lab 03/02/22 2153 03/03/22 0133 03/04/22 0220 03/04/22 1116 03/05/22 0416  WBC 11.2*  --  12.7*  --  15.5*  RBC 2.98*  --  2.46*  --  2.39*  HGB 10.0*   < > 8.3* 9.5* 8.0*  HCT 29.2*   < > 25.0*  28.0* 24.3*  MCV 98.0  --  101.6*  --  101.7*  MCH 33.6  --  33.7  --  33.5  MCHC 34.2  --  33.2  --  32.9  RDW 11.8  --  11.9  --  12.0  PLT 323  --  360  --  330   < > = values in this interval not displayed.   Thyroid No results for input(s): "TSH", "FREET4" in the last 168 hours.  BNP Recent Labs  Lab 03/02/22 2153  BNP 1,301.2*    DDimer No results for input(s): "DDIMER" in the last 168 hours.   Radiology    DG Abd 1 View  Result Date: 03/04/2022 CLINICAL DATA:  Peritoneal dialysis EXAM: ABDOMEN - 1 VIEW COMPARISON:  03/03/2022 FINDINGS: Enteric tube is coiled within the stomach. Tip of the peritoneal dialysis catheter is coiled within the low right pelvis. Nonobstructive bowel gas pattern. Cholecystectomy clips. Calcified splenic artery. IMPRESSION: Tip of the peritoneal dialysis catheter is coiled within the low right pelvis. Electronically Signed   By: Davina Poke D.O.   On: 03/04/2022 14:12   DG Chest Port 1 View  Result Date: 03/04/2022 CLINICAL DATA:  Endotracheal tube in-situ. EXAM: PORTABLE CHEST 1 VIEW COMPARISON:  One-view chest x-ray 03/03/2022 FINDINGS: Heart is enlarged. Atherosclerotic changes are present the aortic arch. Endotracheal tube is stable, 5 cm above the carina. Left IJ line terminates at the cavoatrial junction. Diffuse interstitial and airspace opacities are present throughout the right lung, increasing in the right lower lobe. Perihilar opacities bilaterally are similar the prior study. Retrocardiac opacity is increasing. Bilateral pleural effusions are increasing. IMPRESSION: 1. Increasing interstitial and airspace opacities bilaterally consistent with multifocal pneumonia and probable pulmonary edema. 2. Increasing bilateral effusions. 3. Stable support apparatus. Electronically Signed   By: San Morelle M.D.   On: 03/04/2022 09:53   ECHOCARDIOGRAM COMPLETE  Result Date: 03/04/2022    ECHOCARDIOGRAM  REPORT   Patient Name:   William [name] Date of Exam: 03/04/2022 Medical Rec #:  [id number]      Height:       72.0 in Accession #:    [id number]     Weight:       190.9 lb Date of Birth:  16-Nov-1940     BSA:          2.089 m Patient Age:    82 years       BP:           98/58 mmHg Patient Gender: M              HR:           51 bpm. Exam Location:  Inpatient Procedure: 2D Echo, Cardiac Doppler and Color Doppler                       STAT ECHO Reported to: Dr Phineas Inches on 03/04/2022 7:47:00 AM. Indications:    Cardiomyopathy-ischemic  History:        Patient has prior history of Echocardiogram examinations, most                 recent 03/29/2011. Risk Factors:Hypertension and Dyslipidemia.                 ESRD.  Sonographer:    Clayton Lefort RDCS (AE) Referring Phys: Frederik Pear  Sonographer Comments: Echo performed with patient supine and on artificial respirator. IMPRESSIONS  1. Left ventricular ejection fraction, by estimation, is 40 to 45%. The left ventricle has mildly decreased function. The left ventricle demonstrates global hypokinesis. There is mild left ventricular hypertrophy. Left ventricular diastolic parameters are consistent with Grade I diastolic dysfunction (impaired relaxation).  2. Right ventricular systolic function is mildly reduced. The right ventricular size is normal. Tricuspid regurgitation signal is inadequate for assessing PA pressure.  3. Moderate pleural effusion.  4. Mild mitral valve regurgitation.  5. Aortic valve regurgitation is mild.  6. The inferior vena cava is normal in size with <50% respiratory variability, suggesting right atrial pressure of 8 mmHg. Comparison(s): No prior Echocardiogram. FINDINGS  Left Ventricle: Left ventricular ejection fraction, by estimation, is 40 to 45%. The left ventricle has mildly decreased function. The left ventricle demonstrates global hypokinesis. The left ventricular internal cavity size was normal in size. There is  mild left ventricular hypertrophy. Left ventricular  diastolic parameters are consistent with Grade I diastolic dysfunction (impaired relaxation). Right Ventricle: The right ventricular size is normal. Right ventricular systolic function is mildly reduced. Tricuspid regurgitation signal is inadequate for assessing PA pressure. Left Atrium: Left atrial size was normal in size. Right Atrium: Right atrial size was normal in size. Pericardium: There is no evidence of pericardial effusion. Mitral Valve: Mild mitral valve regurgitation. Tricuspid Valve: Tricuspid valve regurgitation is not demonstrated. Aortic Valve: Aortic valve regurgitation is mild. Aortic regurgitation PHT measures 775 msec. Aortic valve mean gradient measures 4.0 mmHg. Aortic valve peak gradient measures 8.0 mmHg. Aortic valve area, by VTI measures 3.31 cm. Pulmonic Valve: Pulmonic valve regurgitation is mild. Aorta: The aortic root and ascending aorta are structurally normal, with no evidence of dilitation. Venous: The inferior vena cava is normal in size with less than 50% respiratory variability, suggesting right atrial pressure of 8 mmHg. IAS/Shunts: The interatrial septum was not well visualized. Additional Comments: There is a moderate pleural effusion.  LEFT VENTRICLE PLAX 2D LVIDd:  6.50 cm      Diastology LVIDs:         4.90 cm      LV e' medial:    3.59 cm/s LV PW:         2.10 cm      LV E/e' medial:  17.0 LV IVS:        1.40 cm      LV e' lateral:   5.44 cm/s LVOT diam:     2.50 cm      LV E/e' lateral: 11.2 LV SV:         95 LV SV Index:   45 LVOT Area:     4.91 cm  LV Volumes (MOD) LV vol d, MOD A2C: 258.0 ml LV vol d, MOD A4C: 186.0 ml LV vol s, MOD A2C: 139.0 ml LV vol s, MOD A4C: 112.0 ml LV SV MOD A2C:     119.0 ml LV SV MOD A4C:     186.0 ml LV SV MOD BP:      100.0 ml RIGHT VENTRICLE            IVC RV Basal diam:  3.20 cm    IVC diam: 2.10 cm RV S prime:     7.72 cm/s TAPSE (M-mode): 1.4 cm LEFT ATRIUM              Index        RIGHT ATRIUM           Index LA diam:         3.60 cm  1.72 cm/m   RA Area:     18.10 cm LA Vol (A2C):   101.0 ml 48.35 ml/m  RA Volume:   47.70 ml  22.84 ml/m LA Vol (A4C):   54.5 ml  26.09 ml/m LA Biplane Vol: 74.6 ml  35.71 ml/m  AORTIC VALVE AV Area (Vmax):    3.34 cm AV Area (Vmean):   3.54 cm AV Area (VTI):     3.31 cm AV Vmax:           141.00 cm/s AV Vmean:          87.000 cm/s AV VTI:            0.286 m AV Peak Grad:      8.0 mmHg AV Mean Grad:      4.0 mmHg LVOT Vmax:         95.90 cm/s LVOT Vmean:        62.700 cm/s LVOT VTI:          0.193 m LVOT/AV VTI ratio: 0.67 AI PHT:            775 msec  AORTA Ao Root diam: 3.50 cm Ao Asc diam:  3.80 cm MITRAL VALVE MV Area (PHT): 1.96 cm    SHUNTS MV Decel Time: 388 msec    Systemic VTI:  0.19 m MV E velocity: 61.20 cm/s  Systemic Diam: 2.50 cm Phineas Inches Electronically signed by Phineas Inches Signature Date/Time: 03/04/2022/8:04:15 AM    Final    DG Abd 1 View  Result Date: 03/03/2022 CLINICAL DATA:  Orogastric tube placement EXAM: ABDOMEN - 1 VIEW COMPARISON:  08/30/2021 FINDINGS: Distal course of orogastric tube is coiled within the fundus of the stomach. Tip of enteric tube is seen in the region of antrum of the stomach. There are surgical clips in right upper quadrant. Bowel gas pattern in the upper abdomen is unremarkable. Lower abdomen and pelvis  are not included in the image. There is a catheter in the mid abdomen suggesting possible peritoneal dialysis catheter. IMPRESSION: Tip of enteric tube is seen in the antrum of the stomach. Electronically Signed   By: Elmer Picker M.D.   On: 03/03/2022 18:48   DG CHEST PORT 1 VIEW  Result Date: 03/03/2022 CLINICAL DATA:  Evaluate endotracheal tube and enteric tube location EXAM: PORTABLE CHEST 1 VIEW COMPARISON:  Previous studies including the examination done earlier today FINDINGS: Transverse diameter of heart is increased. Tip of endotracheal tube is 5.6 cm above the carina. There is interval placement of left IJ central venous  catheter with its tip in superior vena cava. Enteric tube is noted traversing the esophagus. There is large alveolar infiltrate in right upper lung field with possible interval worsening. There are faint alveolar densities in both parahilar regions and lower lung fields suggesting multifocal pneumonia and possibly pulmonary edema. There is blunting of lateral CP angles. There is no pneumothorax. IMPRESSION: Dense alveolar infiltrate is seen in right upper lobe. There are alveolar densities in both parahilar regions and both lower lung fields with possible interval worsening suggesting multifocal pneumonia and possibly pulmonary edema. Small bilateral pleural effusions. Support devices as described in the body of the report. Electronically Signed   By: Elmer Picker M.D.   On: 03/03/2022 18:46   DG CHEST PORT 1 VIEW  Result Date: 03/03/2022 CLINICAL DATA:  Shortness of breath and cough. EXAM: PORTABLE CHEST 1 VIEW COMPARISON:  12/31/2021 FINDINGS: RIGHT UPPER lobe consolidation/airspace opacities noted, stable to slightly increased. New bilateral airspace opacities within the remainder of the lungs noted and may represent pneumonia and/or edema. There may be trace bilateral pleural effusions present. Cardiomediastinal silhouette is not significantly changed. Probable bibasilar atelectasis noted. IMPRESSION: 1. RIGHT UPPER lobe consolidation/airspace opacities, likely representing pneumonia. 2. New bilateral airspace opacities within the remainder of the lungs which may represent pneumonia and/or edema. Electronically Signed   By: Margarette Canada M.D.   On: 03/03/2022 15:05    Cardiac Studies   Echo: 03/2011   Study Conclusions   - Left ventricle: The cavity size was normal. Systolic    function was mildly reduced. The estimated ejection    fraction was in the range of 45% to 50%. Doppler    parameters are consistent with abnormal left ventricular    relaxation (grade 1 diastolic dysfunction).  -  Aortic valve: Mild regurgitation.  - Mitral valve: Mild regurgitation.  - Left atrium: The atrium was moderately dilated.  - Right ventricle: The cavity size was mildly dilated. Wall    thickness was normal.  - Right atrium: The atrium was mildly dilated.  Echocardiography.  M-mode, complete 2D, spectral Doppler,  and color Doppler.  Height:  Height: 182.9cm. Height: 72in.  Weight:  Weight: 78kg. Weight: 171.6lb.  Body mass index:  BMI: 23.3kg/m^2.  Body surface area:    BSA: 10m2.  Blood  pressure:     163/91.  Patient status:  Outpatient.  [facility]    Echo: 03/04/2022   IMPRESSIONS     1. Left ventricular ejection fraction, by estimation, is 40 to 45%. The  left ventricle has mildly decreased function. The left ventricle  demonstrates global hypokinesis. There is mild left ventricular  hypertrophy. Left ventricular diastolic parameters  are consistent with Grade I diastolic dysfunction (impaired relaxation).   2. Right ventricular systolic function is mildly reduced. The right  ventricular size is normal. Tricuspid regurgitation signal  is inadequate  for assessing PA pressure.   3. Moderate pleural effusion.   4. Mild mitral valve regurgitation.   5. Aortic valve regurgitation is mild.   6. The inferior vena cava is normal in size with <50% respiratory  variability, suggesting right atrial pressure of 8 mmHg.   Comparison(s): No prior Echocardiogram.   FINDINGS   Left Ventricle: Left ventricular ejection fraction, by estimation, is 40  to 45%. The left ventricle has mildly decreased function. The left  ventricle demonstrates global hypokinesis. The left ventricular internal  cavity size was normal in size. There is   mild left ventricular hypertrophy. Left ventricular diastolic parameters  are consistent with Grade I diastolic dysfunction (impaired relaxation).   Right Ventricle: The right ventricular size is normal. Right ventricular  systolic  function is mildly reduced. Tricuspid regurgitation signal is  inadequate for assessing PA pressure.   Left Atrium: Left atrial size was normal in size.   Right Atrium: Right atrial size was normal in size.   Pericardium: There is no evidence of pericardial effusion.   Mitral Valve: Mild mitral valve regurgitation.   Tricuspid Valve: Tricuspid valve regurgitation is not demonstrated.   Aortic Valve: Aortic valve regurgitation is mild. Aortic regurgitation PHT  measures 775 msec. Aortic valve mean gradient measures 4.0 mmHg. Aortic  valve peak gradient measures 8.0 mmHg. Aortic valve area, by VTI measures  3.31 cm.   Pulmonic Valve: Pulmonic valve regurgitation is mild.   Aorta: The aortic root and ascending aorta are structurally normal, with  no evidence of dilitation.   Venous: The inferior vena cava is normal in size with less than 50%  respiratory variability, suggesting right atrial pressure of 8 mmHg.   IAS/Shunts: The interatrial septum was not well visualized.   Additional Comments: There is a moderate pleural effusion.    Patient Profile     82 y.o. male  with a hx of ESRD on PD, hypertension, hypothyroidism, hyperlipidemia, mild MR who is being seen 03/04/2022 for the evaluation of elevated troponin at the request of Dr. Tamala Julian.   Assessment & Plan    Non-STEMI (suspect type II) Abnormal EKG -- Presented with progressive dyspnea, nausea and vomiting with poor p.o. intake. Has severe PNA requiring vent support.  High-sensitivity troponin noted at 690>> 582>> 825>> 912>> 933. Flat trend. EKG with  ST depression in lateral leads. EF 40-45% with global HK. This all is consistent with demand ischemia.  Given his acute illness as well as comorbidities would continue to treat medically at this time -- Continue IV heparin, add ASA -- given hypotension  unable to add additional therapy at this time -- wean Levophed as tolerated.  -- no plans for invasive evaluation at this  time.  -- prior to PNA patient was active doing yard work with no angina per spouse.    Acute hypoxic respiratory failure Pneumonia -- on Vent. Oxygenation improved but still tenuous.  -- Antibiotics per CCM -- on IV pressor support -- vent management per CCM   HFrEF -- Echo this admission with LVEF of 40 to 45%, global hypokinesis, grade 1 diastolic dysfunction, normal RV size with mildly reduced function, mild MR -- Etiology unclear though suspect could be related to  acute illness. Coronary anatomy is unknown -- current requiring IV pressors therefore unable to add addition therapy    HLD -- hx of the same, but has apparently been managed through lifestyle alone -- last LDL 122 in November -- Check lipids  ESRD on PD -- Nephrology following     For questions or updates, please contact [place] Please consult [email or web address] for contact info under        Signed, Kailan Laws [place], MD  03/05/2022, 7:09 AM

## 2022-03-06 ENCOUNTER — Inpatient Hospital Stay (HOSPITAL_COMMUNITY): Payer: PPO

## 2022-03-06 DIAGNOSIS — J9601 Acute respiratory failure with hypoxia: Secondary | ICD-10-CM | POA: Diagnosis not present

## 2022-03-06 DIAGNOSIS — J189 Pneumonia, unspecified organism: Secondary | ICD-10-CM | POA: Diagnosis not present

## 2022-03-06 DIAGNOSIS — N186 End stage renal disease: Secondary | ICD-10-CM | POA: Diagnosis not present

## 2022-03-06 DIAGNOSIS — I214 Non-ST elevation (NSTEMI) myocardial infarction: Secondary | ICD-10-CM | POA: Diagnosis not present

## 2022-03-06 LAB — BASIC METABOLIC PANEL
Anion gap: 19 — ABNORMAL HIGH (ref 5–15)
BUN: 71 mg/dL — ABNORMAL HIGH (ref 8–23)
CO2: 21 mmol/L — ABNORMAL LOW (ref 22–32)
Calcium: 8.4 mg/dL — ABNORMAL LOW (ref 8.9–10.3)
Chloride: 99 mmol/L (ref 98–111)
Creatinine, Ser: 6.67 mg/dL — ABNORMAL HIGH (ref 0.61–1.24)
GFR, Estimated: 8 mL/min — ABNORMAL LOW (ref >60)
Glucose, Bld: 129 mg/dL — ABNORMAL HIGH (ref 70–99)
Potassium: 3.7 mmol/L (ref 3.5–5.1)
Sodium: 139 mmol/L (ref 135–145)

## 2022-03-06 LAB — CBC
HCT: 25.6 % — ABNORMAL LOW (ref 39.0–52.0)
Hemoglobin: 8.5 g/dL — ABNORMAL LOW (ref 13.0–17.0)
MCH: 34.1 pg — ABNORMAL HIGH (ref 26.0–34.0)
MCHC: 33.2 g/dL (ref 30.0–36.0)
MCV: 102.8 fL — ABNORMAL HIGH (ref 80.0–100.0)
Platelets: 318 10*3/uL (ref 150–400)
RBC: 2.49 MIL/uL — ABNORMAL LOW (ref 4.22–5.81)
RDW: 12.3 % (ref 11.5–15.5)
WBC: 13.3 10*3/uL — ABNORMAL HIGH (ref 4.0–10.5)
nRBC: 0 % (ref 0.0–0.2)

## 2022-03-06 LAB — GLUCOSE, CAPILLARY
Glucose-Capillary: 114 mg/dL — ABNORMAL HIGH (ref 70–99)
Glucose-Capillary: 112 mg/dL — ABNORMAL HIGH (ref 70–99)
Glucose-Capillary: 96 mg/dL (ref 70–99)
Glucose-Capillary: 160 mg/dL — ABNORMAL HIGH (ref 70–99)
Glucose-Capillary: 119 mg/dL — ABNORMAL HIGH (ref 70–99)
Glucose-Capillary: 113 mg/dL — ABNORMAL HIGH (ref 70–99)
Glucose-Capillary: 140 mg/dL — ABNORMAL HIGH (ref 70–99)

## 2022-03-06 LAB — CULTURE, RESPIRATORY W GRAM STAIN: Culture: NORMAL

## 2022-03-06 LAB — PHOSPHORUS: Phosphorus: 7.6 mg/dL — ABNORMAL HIGH (ref 2.5–4.6)

## 2022-03-06 LAB — MAGNESIUM: Magnesium: 2.4 mg/dL (ref 1.7–2.4)

## 2022-03-06 LAB — VANCOMYCIN, TROUGH: Vancomycin Tr: 18 ug/mL (ref 15–20)

## 2022-03-06 MED ORDER — ORAL CARE MOUTH RINSE: 15.0000 mL | OROMUCOSAL | Status: DC | PRN

## 2022-03-06 MED ORDER — FUROSEMIDE 10 MG/ML IJ SOLN
40.0000 mg | Freq: Once | INTRAMUSCULAR | Status: AC
  Administered 2022-03-06: 40 mg via INTRAVENOUS
  Filled 2022-03-06: qty 4

## 2022-03-06 MED ORDER — GENTAMICIN SULFATE 0.1 % EX CREA: 1.0000 | TOPICAL_CREAM | Freq: Every day | CUTANEOUS | Status: DC

## 2022-03-06 MED ORDER — FUROSEMIDE 10 MG/ML IJ SOLN
80.0000 mg | Freq: Two times a day (BID) | INTRAMUSCULAR | Status: DC
  Administered 2022-03-06 – 2022-03-07 (×3): 80 mg via INTRAVENOUS
  Filled 2022-03-06 (×3): qty 8

## 2022-03-06 MED ORDER — DELFLEX-LC/2.5% DEXTROSE 394 MOSM/L IP SOLN: INTRAPERITONEAL | Status: DC

## 2022-03-06 MED ORDER — DELFLEX-LC/1.5% DEXTROSE 344 MOSM/L IP SOLN: INTRAPERITONEAL | Status: DC

## 2022-03-06 NOTE — Progress Notes (Signed)
Rounding Note    Patient Name: William [name] Date of Encounter: 03/06/2022  [facility] Cardiologist: None   Subjective   Extubated; no chest pain; breathing much better, but occasionally coughing  Inpatient Medications    Scheduled Meds:  aspirin  81 mg Per Tube Daily   Chlorhexidine Gluconate Cloth  6 each Topical Daily   darbepoetin (ARANESP) injection - NON-DIALYSIS  200 mcg Subcutaneous Q Mon-1800   famotidine  20 mg Per Tube Daily   gentamicin cream  1 Application Topical Daily   gentamicin cream  1 Application Topical Daily   heparin injection (subcutaneous)  5,000 Units Subcutaneous Q8H   insulin aspart  1-3 Units Subcutaneous Q4H   insulin detemir  5 Units Subcutaneous Q12H   methylPREDNISolone (SOLU-MEDROL) injection  40 mg Intravenous Daily   pantoprazole (PROTONIX) IV  40 mg Intravenous Q24H   sodium chloride flush  10-40 mL Intracatheter Q12H   Continuous Infusions:  sodium chloride Stopped (03/04/22 1031)   azithromycin Stopped (03/05/22 2131)   cefTRIAXone (ROCEPHIN)  IV Stopped (03/05/22 2246)   dialysis solution 1.5% low-MG/low-CA     dialysis solution 2.5% low-MG/low-CA     PRN Meds: sodium chloride, acetaminophen **OR** acetaminophen, ondansetron **OR** ondansetron (ZOFRAN) IV, mouth rinse, sodium chloride flush, white petrolatum   Vital Signs    Vitals:   03/06/22 0500 03/06/22 0600 03/06/22 0645 03/06/22 0800  BP: 124/61 121/74    Pulse: 75 73    Resp: 18 (!) 28    Temp:    98 F (36.7 C)  TempSrc:    Oral  SpO2: 91% 97%    Weight:   82.3 kg   Height:        Intake/Output Summary (Last 24 hours) at 03/06/2022 0921 Last data filed at 03/06/2022 0830 Gross per 24 hour  Intake 650.03 ml  Output 1048 ml  Net -397.97 ml      03/06/2022    6:45 AM 03/05/2022    5:00 AM 03/04/2022    4:47 AM  Last 3 Weights  Weight (lbs) 181 lb 7 oz 189 lb 6 oz 190 lb 14.7 oz  Weight (kg) 82.3 kg 85.9 kg 86.6 kg      Telemetry    Sinus rhythm  at 78 - Personally Reviewed  ECG    03/03/23 ECG (independently read by me): NSR inferolater ST changes - Personally Reviewed  Physical Exam   GEN: No acute distress.  Breathing more comfortably.  Occasional cough. Neck: No JVD Cardiac: Regular heart rate and rhythm in the 80s.  Faint 1/6 systolic murmur.  No audible diastolic murmur. Respiratory: Slightly decreased breath sounds most prominent at bases GI: Soft, nontender, non-distended  MS: No edema; No deformity. Neuro:  Nonfocal  Psych: Normal affect   Labs    High Sensitivity Troponin:   Recent Labs  Lab 03/02/22 2153 03/02/22 2352 03/03/22 2047 03/04/22 0036 03/04/22 0220  TROPONINIHS 690* 582* 825* 912* 933*     Chemistry Recent Labs  Lab 03/02/22 2153 03/03/22 0133 03/04/22 0220 03/04/22 1116 03/05/22 0416 03/06/22 0313  NA 129*   < > 128* 132* 135 139  K 4.3   < > 4.1 3.9 4.0 3.7  CL 96*  --  95*  --  99 99  CO2 20*  --  17*  --  22 21*  GLUCOSE 120*  --  374*  --  104* 129*  BUN 59*  --  61*  --  65* 71*  CREATININE  6.08*  --  6.32*  --  6.68* 6.67*  CALCIUM 8.4*  --  8.0*  --  8.2* 8.4*  MG  --   --  1.7  --  2.4 2.4  PROT 7.1  --   --   --   --   --   ALBUMIN 3.2*  --   --   --   --   --   AST 24  --   --   --   --   --   ALT 18  --   --   --   --   --   ALKPHOS 52  --   --   --   --   --   BILITOT 0.3  --   --   --   --   --   GFRNONAA 9*  --  8*  --  8* 8*  ANIONGAP 13  --  16*  --  14 19*   < > = values in this interval not displayed.    Lipids No results for input(s): "CHOL", "TRIG", "HDL", "LABVLDL", "LDLCALC", "CHOLHDL" in the last 168 hours.  Hematology Recent Labs  Lab 03/04/22 0220 03/04/22 1116 03/05/22 0416 03/06/22 0313  WBC 12.7*  --  15.5* 13.3*  RBC 2.46*  --  2.39* 2.49*  HGB 8.3* 9.5* 8.0* 8.5*  HCT 25.0* 28.0* 24.3* 25.6*  MCV 101.6*  --  101.7* 102.8*  MCH 33.7  --  33.5 34.1*  MCHC 33.2  --  32.9 33.2  RDW 11.9  --  12.0 12.3  PLT 360  --  330 318   Thyroid No  results for input(s): "TSH", "FREET4" in the last 168 hours.  BNP Recent Labs  Lab 03/02/22 2153  BNP 1,301.2*    DDimer No results for input(s): "DDIMER" in the last 168 hours.   Lipid Panel  No results found for: "CHOL", "TRIG", "HDL", "CHOLHDL", "VLDL", "LDLCALC", "LDLDIRECT", "LABVLDL"   Radiology    DG CHEST PORT 1 VIEW  Result Date: 03/05/2022 CLINICAL DATA:  Respiratory failure EXAM: PORTABLE CHEST 1 VIEW COMPARISON:  03/04/2022 FINDINGS: Diffuse opacity right hemithorax again noted. Left base consolidation. Vascular congestion. Probable left-sided pleural effusion. No pneumothorax identified. NG tube below the diaphragm and off x-ray. Endotracheal tube tip just below thoracic inlet. Left IJ CVC tip distal SVC. IMPRESSION: Diffuse opacity right hemithorax and left base consolidation or volume loss. Electronically Signed   By: Sammie Bench M.D.   On: 03/05/2022 11:11   DG Abd 1 View  Result Date: 03/04/2022 CLINICAL DATA:  Peritoneal dialysis EXAM: ABDOMEN - 1 VIEW COMPARISON:  03/03/2022 FINDINGS: Enteric tube is coiled within the stomach. Tip of the peritoneal dialysis catheter is coiled within the low right pelvis. Nonobstructive bowel gas pattern. Cholecystectomy clips. Calcified splenic artery. IMPRESSION: Tip of the peritoneal dialysis catheter is coiled within the low right pelvis. Electronically Signed   By: Davina Poke D.O.   On: 03/04/2022 14:12    Cardiac Studies   Echo: 03/2011   Study Conclusions   - Left ventricle: The cavity size was normal. Systolic    function was mildly reduced. The estimated ejection    fraction was in the range of 45% to 50%. Doppler    parameters are consistent with abnormal left ventricular    relaxation (grade 1 diastolic dysfunction).  - Aortic valve: Mild regurgitation.  - Mitral valve: Mild regurgitation.  - Left atrium: The atrium was moderately dilated.  -  Right ventricle: The cavity size was mildly dilated. Wall     thickness was normal.  - Right atrium: The atrium was mildly dilated.  Echocardiography.  M-mode, complete 2D, spectral Doppler,  and color Doppler.  Height:  Height: 182.9cm. Height: 72in.  Weight:  Weight: 78kg. Weight: 171.6lb.  Body mass index:  BMI: 23.3kg/m^2.  Body surface area:    BSA: 43m2.  Blood  pressure:     163/91.  Patient status:  Outpatient.  [facility]    Echo: 03/04/2022   IMPRESSIONS     1. Left ventricular ejection fraction, by estimation, is 40 to 45%. The  left ventricle has mildly decreased function. The left ventricle  demonstrates global hypokinesis. There is mild left ventricular  hypertrophy. Left ventricular diastolic parameters  are consistent with Grade I diastolic dysfunction (impaired relaxation).   2. Right ventricular systolic function is mildly reduced. The right  ventricular size is normal. Tricuspid regurgitation signal is inadequate  for assessing PA pressure.   3. Moderate pleural effusion.   4. Mild mitral valve regurgitation.   5. Aortic valve regurgitation is mild.   6. The inferior vena cava is normal in size with <50% respiratory  variability, suggesting right atrial pressure of 8 mmHg.   Comparison(s): No prior Echocardiogram.   FINDINGS   Left Ventricle: Left ventricular ejection fraction, by estimation, is 40  to 45%. The left ventricle has mildly decreased function. The left  ventricle demonstrates global hypokinesis. The left ventricular internal  cavity size was normal in size. There is   mild left ventricular hypertrophy. Left ventricular diastolic parameters  are consistent with Grade I diastolic dysfunction (impaired relaxation).   Right Ventricle: The right ventricular size is normal. Right ventricular  systolic function is mildly reduced. Tricuspid regurgitation signal is  inadequate for assessing PA pressure.   Left Atrium: Left atrial size was normal in size.   Right Atrium: Right atrial size was  normal in size.   Pericardium: There is no evidence of pericardial effusion.   Mitral Valve: Mild mitral valve regurgitation.   Tricuspid Valve: Tricuspid valve regurgitation is not demonstrated.   Aortic Valve: Aortic valve regurgitation is mild. Aortic regurgitation PHT  measures 775 msec. Aortic valve mean gradient measures 4.0 mmHg. Aortic  valve peak gradient measures 8.0 mmHg. Aortic valve area, by VTI measures  3.31 cm.   Pulmonic Valve: Pulmonic valve regurgitation is mild.   Aorta: The aortic root and ascending aorta are structurally normal, with  no evidence of dilitation.   Venous: The inferior vena cava is normal in size with less than 50%  respiratory variability, suggesting right atrial pressure of 8 mmHg.   IAS/Shunts: The interatrial septum was not well visualized.   Additional Comments: There is a moderate pleural effusion.      Patient Profile     82y.o. male who has a history of end-stage renal disease on peritoneal dialysis, hypertension, hypothyroidism, hyperlipidemia, and mild mitral regurgitation.  He has been found to have acute hypoxic respiratory failure secondary to significant pneumonia and ruled in for non-STEMI.  Assessment & Plan    Non-STEMI (suspect type II): Prior ECGs with inferolateral ST changes.  Will repeat today.  Patient feels well.  No chest pain or significant shortness of breath.  Mild troponin elevation with flat plateau arguing against ACS.  As per Dr. JMartinique no plans for invasive evaluation at this time.  Prior to his significant pneumonia he was very active doing  yard work with no exertional chest pain symptomatology. Acute hypoxic respiratory failure/pneumonia.  Extubated later yesterday.  He continues to be on vancomycin in addition to Rocephin.  Per CCM, patient is to transfer out of ICU today. HFrEF: EF on echo 40 to 45% with global hypokinesis.  Normal RV size with mild reduction in function.  Mild aortic and mitral  regurgitation.  Suspect global hypokinesis related to ongoing infectious process. End-stage renal disease on peritoneal dialysis.  Seen by Dr. Moshe Cipro today. Hyperlipidemia: Not on therapy.  LDL 122 in November 2023.  Will recheck fasting lipid panel. Macrocytic anemia: MCV 102.8      For questions or updates, please contact [place] Please consult [email or web address] for contact info under        Signed, Shelva Majestic, MD  03/06/2022, 9:21 AM

## 2022-03-06 NOTE — Progress Notes (Signed)
   03/06/22 2038  Assess: MEWS Score  Temp 99.2 F (37.3 C)  BP (!) 153/81  MAP (mmHg) 101  Pulse Rate (!) 109  ECG Heart Rate (!) 108  Resp (!) 29  SpO2 96 %  O2 Device Non-rebreather Mask  O2 Flow Rate (L/min) 15 L/min  Assess: MEWS Score  MEWS Temp 0  MEWS Systolic 0  MEWS Pulse 1  MEWS RR 2  MEWS LOC 0  MEWS Score 3  MEWS Score Color Yellow  Assess: if the MEWS score is Yellow or Red  Were vital signs taken at a resting state? Yes  Focused Assessment No change from prior assessment  Does the patient meet 2 or more of the SIRS criteria? Yes  Does the patient have a confirmed or suspected source of infection?  (on antibitics  - MD aware)  MEWS guidelines implemented *See Row Information* Yes  Treat  MEWS Interventions Administered scheduled meds/treatments  Pain Scale 0-10  Pain Score 0  Notify: Charge Nurse/RN  Name of Charge Nurse/RN Notified Jaquetta, RN  Date Charge Nurse/RN Notified 03/06/22  Time Charge Nurse/RN Notified 2038  Document  Patient Outcome Other (Comment) (Verbalized improvement of breathing.)  Progress note created (see row info) Yes  Assess: SIRS CRITERIA  SIRS Temperature  0  SIRS Pulse 1  SIRS Respirations  1  SIRS WBC 0  SIRS Score Sum  2

## 2022-03-06 NOTE — Progress Notes (Signed)
   NAME:  William [name], MRN:  [id number], DOB:  1941/02/12, LOS: 3 ADMISSION DATE:  03/02/2022, CONSULTATION DATE:  03/03/22 REFERRING MD:  Loleta Books, CHIEF COMPLAINT:  SOB   History of Present Illness:  82 year old man with hx of CHF, ESRD on PD, HTN, HLD presenting with cough, DOE, intermittent sputum production.  Denies sick contacts.  +subjective fevers.  CXR showing RUL PNA.  O2 needs have escalated rapidly and CXR deteriorated.  PCCM consulted.   Pertinent  Medical History  ESRD on PD Aortic root dilation Mildly reduced EF HTN BPH  Interim History / Subjective:  Patient was successfully extubated yesterday, remains on 3 L nasal cannula oxygen Continue to complain of dry cough, denies shortness of breath  Objective   Blood pressure 121/74, pulse 73, temperature 98 F (36.7 C), temperature source Oral, resp. rate (!) 28, height 6' (1.829 m), weight 82.3 kg, SpO2 97 %.        Intake/Output Summary (Last 24 hours) at 03/06/2022 2979 Last data filed at 03/06/2022 0830 Gross per 24 hour  Intake 665.73 ml  Output 1098 ml  Net -432.27 ml   Filed Weights   03/04/22 0447 03/05/22 0500 03/06/22 0645  Weight: 86.6 kg 85.9 kg 82.3 kg   Examination: Physical exam: General: Acute on chronically ill-appearing male, lying on the bed HEENT: [place]/AT, eyes anicteric.  moist mucus membranes Neuro: Alert, awake following commands Chest: Crackles heard on right lower lung zone, otherwise clear bilaterally, no wheezes Heart: Regular rate and rhythm, no murmurs or gallops Abdomen: Soft, nontender, nondistended, bowel sounds present Skin: No rash  Assessment & Plan:  Acute hypoxemic respiratory failure secondary to right-sided community-acquired pneumonia  Patient was successfully extubated yesterday Remains on 3 L nasal cannula oxygen, continue titrate with O2 sat goal 92% Continue IV ceftriaxone and azithromycin Patient has received vancomycin, will give him 1 last dose today Cultures have  been negative so far, including viral panel is negative  Agitated delirium, improved Patient mental status have improved, he is calm, sitting in the chair  ESRD on PD Continue peritoneal dialysis per nephrology  HTN Resume amlodipine Hold losartan for now  Acute biventricular HFrEF Demand ischemia Appreciate cardiology input No plan for cardiac intervention at this time  Best Practice (right click and "Reselect all SmartList Selections" daily)   Diet/type: Regular diet DVT prophylaxis: prophylactic heparin  GI prophylaxis: Pantoprazole Lines: Central line left subclavian, discontinue Foley: In place, discontinue Code Status:  DNR Last date of multidisciplinary goals of care discussion 1/2: [family updated at the bedside.]    Jacky Kindle, MD [place] [facility] See Amion for pager If no response to pager, please call [phone] until 7pm After 7pm, Please call E-link [phone]

## 2022-03-06 NOTE — Progress Notes (Signed)
77 - Nehrology at bedside.

## 2022-03-06 NOTE — Progress Notes (Signed)
Unable to get blood gas. Patient is uncooperative. Relative at bedside. Informed PCCM via E link. Plan of care ongoing.

## 2022-03-06 NOTE — Plan of Care (Signed)
  Problem: Education: Goal: Knowledge of General Education information will improve Description: Including pain rating scale, medication(s)/side effects and non-pharmacologic comfort measures Outcome: Progressing   Problem: Health Behavior/Discharge Planning: Goal: Ability to manage health-related needs will improve Outcome: Progressing   Problem: Clinical Measurements: Goal: Respiratory complications will improve Outcome: Progressing   Problem: Clinical Measurements: Goal: Cardiovascular complication will be avoided Outcome: Progressing   Problem: Coping: Goal: Level of anxiety will decrease Outcome: Progressing   Problem: Nutrition: Goal: Adequate nutrition will be maintained Outcome: Progressing   Problem: Pain Managment: Goal: General experience of comfort will improve Outcome: Progressing   Problem: Safety: Goal: Ability to remain free from injury will improve Outcome: Progressing

## 2022-03-06 NOTE — Progress Notes (Signed)
Heart Failure Navigator Progress Note  Assessed for Heart & Vascular TOC clinic readiness.  Patient does not meet criteria due to ESRD on Peritoneal dialysis.   Navigator will sign off at this time.   Earnestine Leys, BSN, Clinical cytogeneticist Only

## 2022-03-06 NOTE — Progress Notes (Signed)
Pharmacy Antibiotic Note  William [name] is a 82 y.o. male admitted on 03/02/2022 with pneumonia.  Pharmacy has been consulted for Vancomycin dosing (day 4). Pt also on Rocephin (ends 1/5) and Azithromycin (ends 1/2).   Pt with ESRD - on peritoneal dialysis and will continue this in the hospital. -vancomycin level= 18  Plan: -Vancomycin '1250mg'$  IV x1 then no further dosing needed as this dose with cover for about 72 hours -Rocephin for 7 days total    Height: 6' (182.9 cm) Weight: 82.3 kg (181 lb 7 oz) IBW/kg (Calculated) : 77.6  Temp (24hrs), Avg:98 F (36.7 C), Min:97.9 F (36.6 C), Max:98.2 F (36.8 C)  Recent Labs  Lab 03/02/22 2153 03/04/22 0220 03/05/22 0416 03/06/22 0313  WBC 11.2* 12.7* 15.5* 13.3*  CREATININE 6.08* 6.32* 6.68* 6.67*  VANCOTROUGH  --   --   --  18     Estimated Creatinine Clearance: 9.7 mL/min (A) (by C-G formula based on SCr of 6.67 mg/dL (H)).    Allergies  Allergen Reactions   Nsaids Other (See Comments)    Chronic kidney disease   Valacyclovir Nausea And Vomiting   Lisinopril Rash    Antimicrobials this admission: 12/29 Ceftriaxone >> 1/5 12/29 Azith >> 1/2 12/30 Vanc >> 1/5  Microbiology results: 12/29 BCx:   Sputum:    MRSA PCR:   Thank you for allowing pharmacy to be a part of this patient's care.  Hildred Laser, PharmD Clinical Pharmacist **Pharmacist phone directory can now be found on [email or web address] ([email or web address]).  Listed under [place].

## 2022-03-06 NOTE — Progress Notes (Signed)
[facility] Progress Note Patient Name: Bleu Minerd DOB: 10/14/1940 MRN: [id number]   Date of Service  03/06/2022  HPI/Events of Note  Nursing reporting that patient has increased confusion. Patient has not received medications that might account for confusion. Blood glucose = 140.   eICU Interventions  Plan: ABG STAT.     Intervention Category Major Interventions: Delirium, psychosis, severe agitation - evaluation and management  [place] Leap Eugene 03/06/2022, 11:21 PM

## 2022-03-06 NOTE — Progress Notes (Signed)
Pt. c/o nausea, Zofran given with good effect, Telemetry called, pt. HR 120's, irregular, upon assessment, pt. more lethargic, arouses to verbal stimuli and answers questions appropriately, LS crackles throughout, RR 24-28, labored, O2 sat 80% on 3L, pt. placed on NRB, O2 sat 100%, RT in to place pt. on 50% venti, EKG performed, Dr. Tacy Learn made aware, RR made aware, Dr. Tacy Learn in to see pt., order placed for Lasix 40 mg IV and given, pt. will be transferred to progressive care per Dr. Tacy Learn, will cont. to monitor  Anastasio Auerbach, RN

## 2022-03-06 NOTE — Progress Notes (Signed)
Subjective:  Extubated yesterday--  on 3 L 02-   looks good Objective Vital signs in last 24 hours: Vitals:   03/06/22 0411 03/06/22 0500 03/06/22 0600 03/06/22 0645  BP: 136/83 124/61 121/74   Pulse: (!) 106 75 73   Resp: (!) 24 18 (!) 28   Temp:      TempSrc:      SpO2: 91% 91% 97%   Weight:    82.3 kg  Height:       Weight change: -3.6 kg  Intake/Output Summary (Last 24 hours) at 03/06/2022 0737 Last data filed at 03/06/2022 0400 Gross per 24 hour  Intake 495.32 ml  Output 575 ml  Net -79.68 ml    OP PD: f/b Dr Smitty Cords from [facility]. 4 exchanges overnight, 2500 cc dwell vol, uses mostly 1.5% and 2.5% bags.  On PD for 3 years. Dry wt 181- 185 per the pt.    Assessment/ Plan: Pt is a 82 y.o. yo male with ESRD on PD who was admitted on 03/02/2022 with  PNA but also felt to have NSTEMI Assessment/Plan: 1. PNA-  previously on vent, now extubated  rocephin/axithro/vanc/steroids-  per CCM 2. ESRD - normally on PD at home - have been doing his usual regimen-  PD cath appears to be in good position 3. Anemia-  hgb low-  giving ESA 4. Secondary hyperparathyroidism-  not on any bone meds at present-  calc OK-  phos is fine for situation -  he says that he has never been on a phos binder -  phos is always good 5. HTN/volume-  normally on 3 BP meds plus diuretic-  he does make urine-   CVP is adequate-  using all 2.5 % bags here plus occasional lasix-  sodium is better so I dont think volume is a big issue at this time -  used1.5% bags last night-  is at EDW 6. CV-  though to have NSTEMI-  cards involved-  bradycardia resolved-  supportive care   William [name]    Labs: Basic Metabolic Panel: Recent Labs  Lab 03/04/22 0220 03/04/22 1116 03/05/22 0416 03/06/22 0313  NA 128* 132* 135 139  K 4.1 3.9 4.0 3.7  CL 95*  --  99 99  CO2 17*  --  22 21*  GLUCOSE 374*  --  104* 129*  BUN 61*  --  65* 71*  CREATININE 6.32*  --  6.68* 6.67*  CALCIUM 8.0*  --  8.2* 8.4*  PHOS  6.2*  --  6.5* 7.6*   Liver Function Tests: Recent Labs  Lab 03/02/22 2153  AST 24  ALT 18  ALKPHOS 52  BILITOT 0.3  PROT 7.1  ALBUMIN 3.2*   Recent Labs  Lab 03/02/22 2153  LIPASE 26   No results for input(s): "AMMONIA" in the last 168 hours. CBC: Recent Labs  Lab 03/02/22 2153 03/03/22 0133 03/04/22 0220 03/04/22 1116 03/05/22 0416 03/06/22 0313  WBC 11.2*  --  12.7*  --  15.5* 13.3*  NEUTROABS 9.8*  --   --   --   --   --   HGB 10.0*   < > 8.3* 9.5* 8.0* 8.5*  HCT 29.2*   < > 25.0* 28.0* 24.3* 25.6*  MCV 98.0  --  101.6*  --  101.7* 102.8*  PLT 323  --  360  --  330 318   < > = values in this interval not displayed.   Cardiac Enzymes: No results for  input(s): "CKTOTAL", "CKMB", "CKMBINDEX", "TROPONINI" in the last 168 hours. CBG: Recent Labs  Lab 03/05/22 1532 03/05/22 1944 03/05/22 2350 03/06/22 0322 03/06/22 0658  GLUCAP 109* 123* 123* 114* 112*    Iron Studies: No results for input(s): "IRON", "TIBC", "TRANSFERRIN", "FERRITIN" in the last 72 hours. Studies/Results: DG CHEST PORT 1 VIEW  Result Date: 03/05/2022 CLINICAL DATA:  Respiratory failure EXAM: PORTABLE CHEST 1 VIEW COMPARISON:  03/04/2022 FINDINGS: Diffuse opacity right hemithorax again noted. Left base consolidation. Vascular congestion. Probable left-sided pleural effusion. No pneumothorax identified. NG tube below the diaphragm and off x-ray. Endotracheal tube tip just below thoracic inlet. Left IJ CVC tip distal SVC. IMPRESSION: Diffuse opacity right hemithorax and left base consolidation or volume loss. Electronically Signed   By: Sammie Bench M.D.   On: 03/05/2022 11:11   DG Abd 1 View  Result Date: 03/04/2022 CLINICAL DATA:  Peritoneal dialysis EXAM: ABDOMEN - 1 VIEW COMPARISON:  03/03/2022 FINDINGS: Enteric tube is coiled within the stomach. Tip of the peritoneal dialysis catheter is coiled within the low right pelvis. Nonobstructive bowel gas pattern. Cholecystectomy clips. Calcified  splenic artery. IMPRESSION: Tip of the peritoneal dialysis catheter is coiled within the low right pelvis. Electronically Signed   By: Davina Poke D.O.   On: 03/04/2022 14:12   ECHOCARDIOGRAM COMPLETE  Result Date: 03/04/2022    ECHOCARDIOGRAM REPORT   Patient Name:   William [name] Date of Exam: 03/04/2022 Medical Rec #:  [id number]      Height:       72.0 in Accession #:    [id number]     Weight:       190.9 lb Date of Birth:  1940-03-16     BSA:          2.089 m Patient Age:    62 years       BP:           98/58 mmHg Patient Gender: M              HR:           51 bpm. Exam Location:  Inpatient Procedure: 2D Echo, Cardiac Doppler and Color Doppler                       STAT ECHO Reported to: Dr Phineas Inches on 03/04/2022 7:47:00 AM. Indications:    Cardiomyopathy-ischemic  History:        Patient has prior history of Echocardiogram examinations, most                 recent 03/29/2011. Risk Factors:Hypertension and Dyslipidemia.                 ESRD.  Sonographer:    Clayton Lefort RDCS (AE) Referring Phys: Frederik Pear  Sonographer Comments: Echo performed with patient supine and on artificial respirator. IMPRESSIONS  1. Left ventricular ejection fraction, by estimation, is 40 to 45%. The left ventricle has mildly decreased function. The left ventricle demonstrates global hypokinesis. There is mild left ventricular hypertrophy. Left ventricular diastolic parameters are consistent with Grade I diastolic dysfunction (impaired relaxation).  2. Right ventricular systolic function is mildly reduced. The right ventricular size is normal. Tricuspid regurgitation signal is inadequate for assessing PA pressure.  3. Moderate pleural effusion.  4. Mild mitral valve regurgitation.  5. Aortic valve regurgitation is mild.  6. The inferior vena cava is normal in size with <50% respiratory variability, suggesting right atrial pressure of 8 mmHg.  Comparison(s): No prior Echocardiogram. FINDINGS  Left Ventricle: Left  ventricular ejection fraction, by estimation, is 40 to 45%. The left ventricle has mildly decreased function. The left ventricle demonstrates global hypokinesis. The left ventricular internal cavity size was normal in size. There is  mild left ventricular hypertrophy. Left ventricular diastolic parameters are consistent with Grade I diastolic dysfunction (impaired relaxation). Right Ventricle: The right ventricular size is normal. Right ventricular systolic function is mildly reduced. Tricuspid regurgitation signal is inadequate for assessing PA pressure. Left Atrium: Left atrial size was normal in size. Right Atrium: Right atrial size was normal in size. Pericardium: There is no evidence of pericardial effusion. Mitral Valve: Mild mitral valve regurgitation. Tricuspid Valve: Tricuspid valve regurgitation is not demonstrated. Aortic Valve: Aortic valve regurgitation is mild. Aortic regurgitation PHT measures 775 msec. Aortic valve mean gradient measures 4.0 mmHg. Aortic valve peak gradient measures 8.0 mmHg. Aortic valve area, by VTI measures 3.31 cm. Pulmonic Valve: Pulmonic valve regurgitation is mild. Aorta: The aortic root and ascending aorta are structurally normal, with no evidence of dilitation. Venous: The inferior vena cava is normal in size with less than 50% respiratory variability, suggesting right atrial pressure of 8 mmHg. IAS/Shunts: The interatrial septum was not well visualized. Additional Comments: There is a moderate pleural effusion.  LEFT VENTRICLE PLAX 2D LVIDd:         6.50 cm      Diastology LVIDs:         4.90 cm      LV e' medial:    3.59 cm/s LV PW:         2.10 cm      LV E/e' medial:  17.0 LV IVS:        1.40 cm      LV e' lateral:   5.44 cm/s LVOT diam:     2.50 cm      LV E/e' lateral: 11.2 LV SV:         95 LV SV Index:   45 LVOT Area:     4.91 cm  LV Volumes (MOD) LV vol d, MOD A2C: 258.0 ml LV vol d, MOD A4C: 186.0 ml LV vol s, MOD A2C: 139.0 ml LV vol s, MOD A4C: 112.0 ml LV SV  MOD A2C:     119.0 ml LV SV MOD A4C:     186.0 ml LV SV MOD BP:      100.0 ml RIGHT VENTRICLE            IVC RV Basal diam:  3.20 cm    IVC diam: 2.10 cm RV S prime:     7.72 cm/s TAPSE (M-mode): 1.4 cm LEFT ATRIUM              Index        RIGHT ATRIUM           Index LA diam:        3.60 cm  1.72 cm/m   RA Area:     18.10 cm LA Vol (A2C):   101.0 ml 48.35 ml/m  RA Volume:   47.70 ml  22.84 ml/m LA Vol (A4C):   54.5 ml  26.09 ml/m LA Biplane Vol: 74.6 ml  35.71 ml/m  AORTIC VALVE AV Area (Vmax):    3.34 cm AV Area (Vmean):   3.54 cm AV Area (VTI):     3.31 cm AV Vmax:           141.00 cm/s AV Vmean:  87.000 cm/s AV VTI:            0.286 m AV Peak Grad:      8.0 mmHg AV Mean Grad:      4.0 mmHg LVOT Vmax:         95.90 cm/s LVOT Vmean:        62.700 cm/s LVOT VTI:          0.193 m LVOT/AV VTI ratio: 0.67 AI PHT:            775 msec  AORTA Ao Root diam: 3.50 cm Ao Asc diam:  3.80 cm MITRAL VALVE MV Area (PHT): 1.96 cm    SHUNTS MV Decel Time: 388 msec    Systemic VTI:  0.19 m MV E velocity: 61.20 cm/s  Systemic Diam: 2.50 cm Phineas Inches Electronically signed by Phineas Inches Signature Date/Time: 03/04/2022/8:04:15 AM    Final    Medications: Infusions:  sodium chloride Stopped (03/04/22 1031)   azithromycin Stopped (03/05/22 2131)   cefTRIAXone (ROCEPHIN)  IV Stopped (03/05/22 2246)   dialysis solution 1.5% low-MG/low-CA     dialysis solution 2.5% low-MG/low-CA     norepinephrine (LEVOPHED) Adult infusion Stopped (03/05/22 0706)    Scheduled Medications:  aspirin  81 mg Per Tube Daily   Chlorhexidine Gluconate Cloth  6 each Topical Daily   darbepoetin (ARANESP) injection - NON-DIALYSIS  200 mcg Subcutaneous Q Mon-1800   famotidine  20 mg Per Tube Daily   gentamicin cream  1 Application Topical Daily   gentamicin cream  1 Application Topical Daily   heparin injection (subcutaneous)  5,000 Units Subcutaneous Q8H   insulin aspart  1-3 Units Subcutaneous Q4H   insulin detemir  5 Units  Subcutaneous Q12H   methylPREDNISolone (SOLU-MEDROL) injection  40 mg Intravenous Daily   pantoprazole (PROTONIX) IV  40 mg Intravenous Q24H   sodium chloride flush  10-40 mL Intracatheter Q12H   vancomycin variable dose per unstable renal function (pharmacist dosing)   Does not apply See admin instructions    have reviewed scheduled and prn medications.  Physical Exam: General: alert , now extubated  Heart: RRR Lungs: CBS bilat Abdomen: distended-  non tender-  PD cath non tender Extremities:really  no edema  Dialysis Access: PD cath     03/06/2022,7:37 AM  LOS: 3 days

## 2022-03-06 NOTE — Progress Notes (Addendum)
   NAME:  William [name], MRN:  [id number], DOB:  10/06/1940, LOS: 3 ADMISSION DATE:  03/02/2022, CONSULTATION DATE:  03/03/22 REFERRING MD:  Loleta Books, CHIEF COMPLAINT:  SOB   History of Present Illness:  82 year old man with hx of CHF, ESRD on PD, HTN, HLD presenting with cough, DOE, intermittent sputum production.  Denies sick contacts.  +subjective fevers.  CXR showing RUL PNA.  O2 needs have escalated rapidly and CXR deteriorated.  PCCM consulted.   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 Awake and cooperative.   Objective   Blood pressure 138/69, pulse 84, temperature 98 F (36.7 C), temperature source Oral, resp. rate (!) 24, height 6' (1.829 m), weight 82.3 kg, SpO2 94 %.        Intake/Output Summary (Last 24 hours) at 03/06/2022 1052 Last data filed at 03/06/2022 0830 Gross per 24 hour  Intake 650.03 ml  Output 1048 ml  Net -397.97 ml    Filed Weights   03/04/22 0447 03/05/22 0500 03/06/22 0645  Weight: 86.6 kg 85.9 kg 82.3 kg   Examination: General: Intubated on sedation no distress HEENT: ET tube OG tube in place.  Trachea midline Pulmonary: Bronchial breath sounds at right base.  No adventitial sounds.  No distress on PSV 10/5 CV: Heart sounds normal.  No edema.  Extremities warm well-perfused. Abdomen: Soft nontender.  PD catheter in place. Extremities: No mottling. GU: Foley catheter in place still producing small amount of urine.  ancillary tests personally reviewed:  Chest x-ray shows markedly asymmetric infiltrate affecting predominantly the right lung. ABG: 7.39/29.2/79  Assessment & Plan:   Critically ill due to acute hypoxemic respiratory failure secondary to severe R sided CAP, requiring mechanical ventilation -Extubate today following successful SBT.  Community-acquired pneumonia -Continue empiric ceftriaxone and azithromycin pending culture  results. -Course of adjunctive steroids.  Agitated delirium- now resolved - Stop all sedation following extubation - Delirium precautions.   ESRD on PD -Continue PD exchanges running even for now  Hx HTN -Antihypertensives on hold. -Titrate norepinephrine to keep MAP greater than 65, likely offsetting effects of sedation at this time.  Hx HFrEF Troponemia, type II response -No signs of cardiac decompensation.  No specific therapy at this time.  Best Practice (right click and "Reselect all SmartList Selections" daily)   Diet/type: NPO advance diet post extubation.  DVT prophylaxis: prophylactic heparin  GI prophylaxis: Pantoprazole Lines: Central line left subclavian Foley: In place Code Status:  DNR Last date of multidisciplinary goals of care discussion [family updated at the bedside.]  CRITICAL CARE Performed by: Kipp Brood   Total critical care time: 35 minutes  Critical care time was exclusive of separately billable procedures and treating other patients.  Critical care was necessary to treat or prevent imminent or life-threatening deterioration.  Critical care was time spent personally by me on the following activities: development of treatment plan with patient and/or surrogate as well as nursing, discussions with consultants, evaluation of patient's response to treatment, examination of patient, obtaining history from patient or surrogate, ordering and performing treatments and interventions, ordering and review of laboratory studies, ordering and review of radiographic studies, pulse oximetry, re-evaluation of patient's condition and participation in multidisciplinary rounds.  Kipp Brood, MD [facility] ICU Physician [place]  Pager: [phone] Mobile: [phone] After hours: [phone].

## 2022-03-06 NOTE — Evaluation (Signed)
Physical Therapy Evaluation Patient Details Name: William [name] MRN: [id number] DOB: 10-17-1940 Today's Date: 03/06/2022  History of Present Illness  Pt is a 82 y.o. M who presents 03/02/2022 with cough, DOE, intermittent sputum production. CXR showing RUL PNA. Intubated 12/30-1/1. Significant PMH: CHF, ESRD on PD, HTN, HLD.  Clinical Impression  Pt presents with above. 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 Pt presents with decreased cardiopulmonary endurance and dynamic balance deficits. Pt ambulating 80 ft with no assistive device at a min assist level; episodes of frequent coughing throughout on 3L O2. Education reinforced regarding incentive spirometer and flutter valve use. Suspect pt will progress well. Will continue to follow acutely.     Recommendations for follow up therapy are one component of a multi-disciplinary discharge planning process, led by the attending physician.  Recommendations may be updated based on patient status, additional functional criteria and insurance authorization.  Follow Up Recommendations No PT follow up      Assistance Recommended at Discharge PRN  Patient can return home with the following  A little help with walking and/or transfers;A little help with bathing/dressing/bathroom;Assistance with cooking/housework;Assist for transportation;Help with stairs or ramp for entrance    Equipment Recommendations None recommended by PT (pt reports he has a RW)  Recommendations for Other Services       Functional Status Assessment Patient has had a recent decline in their functional status and demonstrates the ability to make significant improvements in function in a reasonable and predictable amount of time.     Precautions / Restrictions Precautions Precautions: Fall Restrictions Weight Bearing Restrictions: No      Mobility  Bed Mobility Overal bed mobility: Needs  Assistance Bed Mobility: Supine to Sit     Supine to sit: Supervision          Transfers Overall transfer level: Needs assistance Equipment used: None Transfers: Sit to/from Stand Sit to Stand: Min guard                Ambulation/Gait Ambulation/Gait assistance: Min assist Gait Distance (Feet): 80 Feet Assistive device: None Gait Pattern/deviations: Step-through pattern, Decreased stride length, Drifts right/left Gait velocity: decreased     General Gait Details: Pt requiring minA for balance, several episodes of mild lateral vs posterior LOB, cues for stopping to reset prior to continuing to take steps  Stairs            Wheelchair Mobility    Modified Rankin (Stroke Patients Only)       Balance Overall balance assessment: Needs assistance Sitting-balance support: Feet supported Sitting balance-Leahy Scale: Good     Standing balance support: No upper extremity supported, During functional activity Standing balance-Leahy Scale: Fair                               Pertinent Vitals/Pain Pain Assessment Pain Assessment: No/denies pain    Home Living Family/patient expects to be discharged to:: Private residence Living Arrangements: Spouse/significant other;Children (daughter) Available Help at Discharge: Family Type of Home: House Home Access: Stairs to enter   [employer] of Steps: 2 (through garage; no steps through the sun room)   Home Layout: One level Home Equipment: BSC/3in1;Rolling Walker (2 wheels);Cane - single point;Wheelchair - manual (mother in [place] old equipment)      Prior Function Prior Level of Function : Independent/Modified Independent  Mobility Comments: Holiday representative        Extremity/Trunk Assessment   Upper Extremity Assessment Upper Extremity Assessment: Overall WFL for tasks assessed    Lower Extremity  Assessment Lower Extremity Assessment: Overall WFL for tasks assessed       Communication   Communication: HOH  Cognition Arousal/Alertness: Awake/alert Behavior During Therapy: Impulsive Overall Cognitive Status: Within Functional Limits for tasks assessed                                 General Comments: Mildly impulsive        General Comments      Exercises     Assessment/Plan    PT Assessment Patient needs continued PT services  PT Problem List Decreased strength;Decreased activity tolerance;Decreased balance;Decreased mobility;Cardiopulmonary status limiting activity       PT Treatment Interventions DME instruction;Gait training;Functional mobility training;Therapeutic activities;Stair training;Therapeutic exercise;Balance training;Patient/family education    PT Goals (Current goals can be found in the Care Plan section)  Acute Rehab PT Goals Patient Stated Goal: to get out of bed PT Goal Formulation: With patient/family Time For Goal Achievement: 03/20/22 Potential to Achieve Goals: Good    Frequency Min 3X/week     Co-evaluation               AM-PAC PT "6 Clicks" Mobility  Outcome Measure Help needed turning from your back to your side while in a flat bed without using bedrails?: None Help needed moving from lying on your back to sitting on the side of a flat bed without using bedrails?: A Little Help needed moving to and from a bed to a chair (including a wheelchair)?: A Little Help needed standing up from a chair using your arms (e.g., wheelchair or bedside chair)?: A Little Help needed to walk in hospital room?: A Little Help needed climbing 3-5 steps with a railing? : A Lot 6 Click Score: 18    End of Session Equipment Utilized During Treatment: Gait belt Activity Tolerance: Patient tolerated treatment well Patient left: in chair;with call bell/phone within reach;with chair alarm set Nurse Communication: Mobility status PT  Visit Diagnosis: Unsteadiness on feet (R26.81);Difficulty in walking, not elsewhere classified (R26.2)    Time: 2595-6387 PT Time Calculation (min) (ACUTE ONLY): 21 min   Charges:   PT Evaluation $PT Eval Moderate Complexity: 1 Mod          Wyona Almas, PT, DPT Acute Rehabilitation Services Office [phone]   Deno Etienne 03/06/2022, 1:09 PM

## 2022-03-07 ENCOUNTER — Other Ambulatory Visit: Payer: Self-pay

## 2022-03-07 ENCOUNTER — Inpatient Hospital Stay (HOSPITAL_COMMUNITY): Payer: PPO

## 2022-03-07 DIAGNOSIS — J189 Pneumonia, unspecified organism: Secondary | ICD-10-CM | POA: Diagnosis not present

## 2022-03-07 DIAGNOSIS — E785 Hyperlipidemia, unspecified: Secondary | ICD-10-CM | POA: Diagnosis not present

## 2022-03-07 DIAGNOSIS — J9601 Acute respiratory failure with hypoxia: Secondary | ICD-10-CM | POA: Diagnosis not present

## 2022-03-07 DIAGNOSIS — N186 End stage renal disease: Secondary | ICD-10-CM | POA: Diagnosis not present

## 2022-03-07 LAB — CBC
HCT: 28.4 % — ABNORMAL LOW (ref 39.0–52.0)
Hemoglobin: 9.6 g/dL — ABNORMAL LOW (ref 13.0–17.0)
MCH: 34.2 pg — ABNORMAL HIGH (ref 26.0–34.0)
MCHC: 33.8 g/dL (ref 30.0–36.0)
MCV: 101.1 fL — ABNORMAL HIGH (ref 80.0–100.0)
Platelets: 352 10*3/uL (ref 150–400)
RBC: 2.81 MIL/uL — ABNORMAL LOW (ref 4.22–5.81)
RDW: 12.1 % (ref 11.5–15.5)
WBC: 12.3 10*3/uL — ABNORMAL HIGH (ref 4.0–10.5)
nRBC: 0.2 % (ref 0.0–0.2)

## 2022-03-07 LAB — BASIC METABOLIC PANEL
Anion gap: 19 — ABNORMAL HIGH (ref 5–15)
BUN: 73 mg/dL — ABNORMAL HIGH (ref 8–23)
CO2: 21 mmol/L — ABNORMAL LOW (ref 22–32)
Calcium: 8.4 mg/dL — ABNORMAL LOW (ref 8.9–10.3)
Chloride: 98 mmol/L (ref 98–111)
Creatinine, Ser: 6.74 mg/dL — ABNORMAL HIGH (ref 0.61–1.24)
GFR, Estimated: 8 mL/min — ABNORMAL LOW (ref >60)
Glucose, Bld: 164 mg/dL — ABNORMAL HIGH (ref 70–99)
Potassium: 3.6 mmol/L (ref 3.5–5.1)
Sodium: 138 mmol/L (ref 135–145)

## 2022-03-07 LAB — MAGNESIUM: Magnesium: 2.2 mg/dL (ref 1.7–2.4)

## 2022-03-07 LAB — LIPID PANEL
Cholesterol: 168 mg/dL (ref 0–200)
HDL: 47 mg/dL (ref >40)
LDL Cholesterol: 94 mg/dL (ref 0–99)
Total CHOL/HDL Ratio: 3.6 RATIO
Triglycerides: 136 mg/dL (ref <150)
VLDL: 27 mg/dL (ref 0–40)

## 2022-03-07 LAB — PHOSPHORUS: Phosphorus: 7.9 mg/dL — ABNORMAL HIGH (ref 2.5–4.6)

## 2022-03-07 LAB — GLUCOSE, CAPILLARY
Glucose-Capillary: 123 mg/dL — ABNORMAL HIGH (ref 70–99)
Glucose-Capillary: 120 mg/dL — ABNORMAL HIGH (ref 70–99)
Glucose-Capillary: 113 mg/dL — ABNORMAL HIGH (ref 70–99)
Glucose-Capillary: 136 mg/dL — ABNORMAL HIGH (ref 70–99)
Glucose-Capillary: 211 mg/dL — ABNORMAL HIGH (ref 70–99)

## 2022-03-07 LAB — MRSA NEXT GEN BY PCR, NASAL: MRSA by PCR Next Gen: NOT DETECTED

## 2022-03-07 MED ORDER — DEXMEDETOMIDINE HCL IN NACL 400 MCG/100ML IV SOLN
0.0000 ug/kg/h | INTRAVENOUS | Status: DC
  Administered 2022-03-07 – 2022-03-08 (×2): 0.4 ug/kg/h via INTRAVENOUS
  Administered 2022-03-08: 0.8 ug/kg/h via INTRAVENOUS
  Filled 2022-03-07: qty 100
  Filled 2022-03-07: qty 200

## 2022-03-07 MED ORDER — PANTOPRAZOLE SODIUM 40 MG PO TBEC
40.0000 mg | DELAYED_RELEASE_TABLET | Freq: Every day | ORAL | Status: DC
  Administered 2022-03-07 – 2022-03-15 (×9): 40 mg via ORAL
  Filled 2022-03-07 (×9): qty 1

## 2022-03-07 MED ORDER — SEVELAMER CARBONATE 800 MG PO TABS
800.0000 mg | ORAL_TABLET | Freq: Three times a day (TID) | ORAL | Status: DC
  Administered 2022-03-07 – 2022-03-15 (×11): 800 mg via ORAL
  Filled 2022-03-07 (×14): qty 1

## 2022-03-07 MED ORDER — HALOPERIDOL LACTATE 5 MG/ML IJ SOLN
1.0000 mg | Freq: Four times a day (QID) | INTRAMUSCULAR | Status: AC | PRN
  Administered 2022-03-13 – 2022-03-14 (×2): 1 mg via INTRAVENOUS
  Filled 2022-03-07 (×2): qty 1

## 2022-03-07 MED ORDER — QUETIAPINE FUMARATE 25 MG PO TABS
25.0000 mg | ORAL_TABLET | Freq: Every day | ORAL | Status: DC
  Administered 2022-03-07: 25 mg via ORAL
  Filled 2022-03-07: qty 1

## 2022-03-07 MED ORDER — ASPIRIN 81 MG PO CHEW
81.0000 mg | CHEWABLE_TABLET | Freq: Every day | ORAL | Status: DC
  Administered 2022-03-07 – 2022-03-15 (×7): 81 mg via ORAL
  Filled 2022-03-07 (×8): qty 1

## 2022-03-07 MED ORDER — ATORVASTATIN CALCIUM 10 MG PO TABS
20.0000 mg | ORAL_TABLET | Freq: Every day | ORAL | Status: DC
  Administered 2022-03-08 – 2022-03-12 (×5): 20 mg via ORAL
  Filled 2022-03-07 (×5): qty 2

## 2022-03-07 MED ORDER — HALOPERIDOL LACTATE 5 MG/ML IJ SOLN
1.0000 mg | Freq: Once | INTRAMUSCULAR | Status: AC
  Administered 2022-03-07: 1 mg via INTRAVENOUS
  Filled 2022-03-07: qty 1

## 2022-03-07 NOTE — Progress Notes (Signed)
Transferred to 3M02.

## 2022-03-07 NOTE — Progress Notes (Signed)
[place] Progress Note Patient Name: William [name] DOB: 1940/05/08 MRN: [id number]   Date of Service  03/07/2022  HPI/Events of Note  Nursing reports that RT is not able to get ABG as the patient is not cooperative. Patient is also trying to take off his oximetry probe and BiPAP. QTc interval = 0.42 seconds.   eICU Interventions  Plan: Haldol 1 mg IV now.         Trevonn Hallum [place] 03/07/2022, 12:09 AM

## 2022-03-07 NOTE — Progress Notes (Signed)
Reached out to HD at [phone], request Dialysis nurse to come look at dialysis machine. State will send someone soon.

## 2022-03-07 NOTE — Progress Notes (Signed)
Patient taken off of BiPAP and placed on salter. Physician in the room and states it is okay to leave the patient on current 5L.
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
## 2022-03-07 NOTE — Progress Notes (Signed)
Rounding Note    Patient Name: William [name] Date of Encounter: 03/07/2022  [place] Cardiologist: None   Subjective   Transferred to 16M overnight; no chest pain.  Cough has improved  Inpatient Medications    Scheduled Meds:  aspirin  81 mg Oral Daily   Chlorhexidine Gluconate Cloth  6 each Topical Daily   darbepoetin (ARANESP) injection - NON-DIALYSIS  200 mcg Subcutaneous Q Mon-1800   furosemide  80 mg Intravenous Q12H   gentamicin cream  1 Application Topical Daily   heparin injection (subcutaneous)  5,000 Units Subcutaneous Q8H   insulin aspart  1-3 Units Subcutaneous Q4H   insulin detemir  5 Units Subcutaneous Q12H   methylPREDNISolone (SOLU-MEDROL) injection  40 mg Intravenous Daily   pantoprazole  40 mg Oral Daily   QUEtiapine  25 mg Oral QHS   sevelamer carbonate  800 mg Oral TID WC   sodium chloride flush  10-40 mL Intracatheter Q12H   Continuous Infusions:  sodium chloride Stopped (03/04/22 1031)   cefTRIAXone (ROCEPHIN)  IV Stopped (03/06/22 2325)   dexmedetomidine (PRECEDEX) IV infusion 0.7 mcg/kg/hr (03/07/22 0600)   dialysis solution 1.5% low-MG/low-CA     dialysis solution 2.5% low-MG/low-CA     PRN Meds: sodium chloride, acetaminophen **OR** acetaminophen, haloperidol lactate, ondansetron **OR** ondansetron (ZOFRAN) IV, mouth rinse, sodium chloride flush, white petrolatum   Vital Signs    Vitals:   03/07/22 0700 03/07/22 0723 03/07/22 0800 03/07/22 1104  BP: 96/63  93/67   Pulse: 61  62   Resp: 16  16   Temp:    (!) 97.5 F (36.4 C)  TempSrc:  Axillary  Oral  SpO2: 99%  99%   Weight:      Height:        Intake/Output Summary (Last 24 hours) at 03/07/2022 1106 Last data filed at 03/07/2022 0909 Gross per 24 hour  Intake -423.73 ml  Output 525 ml  Net -948.73 ml      03/07/2022    4:00 AM 03/07/2022    2:28 AM 03/06/2022    6:45 AM  Last 3 Weights  Weight (lbs) 188 lb 11.4 oz 190 lb 4.1 oz 181 lb 7 oz  Weight (kg) 85.6 kg 86.3  kg 82.3 kg      Telemetry    Sinus rhythm at 80 - Personally Reviewed  ECG   03/07/2022 ECG (independently read by me): Normal sinus rhythm at 77, ST segment changes 1, aVL, V4 through V6.   03/03/23 ECG (independently read by me): NSR inferolater ST changes - Personally Reviewed  Physical Exam   GEN: No acute distress.  Breathing more comfortably.  Occasional cough. Neck: No JVD Cardiac: Regular heart rate and rhythm in the 80s.  Faint 1/6 systolic murmur.  No audible diastolic murmur. Respiratory: Slightly decreased breath sounds most prominent at bases GI: Soft, nontender, non-distended  MS: No edema; No deformity. Neuro:  Nonfocal  Psych: Normal affect   Labs    High Sensitivity Troponin:   Recent Labs  Lab 03/02/22 2153 03/02/22 2352 03/03/22 2047 03/04/22 0036 03/04/22 0220  TROPONINIHS 690* 582* 825* 912* 933*     Chemistry Recent Labs  Lab 03/02/22 2153 03/03/22 0133 03/05/22 0416 03/06/22 0313 03/07/22 0043  NA 129*   < > 135 139 138  K 4.3   < > 4.0 3.7 3.6  CL 96*   < > 99 99 98  CO2 20*   < > 22 21* 21*  GLUCOSE 120*   < >  104* 129* 164*  BUN 59*   < > 65* 71* 73*  CREATININE 6.08*   < > 6.68* 6.67* 6.74*  CALCIUM 8.4*   < > 8.2* 8.4* 8.4*  MG  --    < > 2.4 2.4 2.2  PROT 7.1  --   --   --   --   ALBUMIN 3.2*  --   --   --   --   AST 24  --   --   --   --   ALT 18  --   --   --   --   ALKPHOS 52  --   --   --   --   BILITOT 0.3  --   --   --   --   GFRNONAA 9*   < > 8* 8* 8*  ANIONGAP 13   < > 14 19* 19*   < > = values in this interval not displayed.    Lipids  Recent Labs  Lab 03/07/22 0043  CHOL 168  TRIG 136  HDL 47  LDLCALC 94  CHOLHDL 3.6    Hematology Recent Labs  Lab 03/05/22 0416 03/06/22 0313 03/07/22 0043  WBC 15.5* 13.3* 12.3*  RBC 2.39* 2.49* 2.81*  HGB 8.0* 8.5* 9.6*  HCT 24.3* 25.6* 28.4*  MCV 101.7* 102.8* 101.1*  MCH 33.5 34.1* 34.2*  MCHC 32.9 33.2 33.8  RDW 12.0 12.3 12.1  PLT 330 318 352   Thyroid No  results for input(s): "TSH", "FREET4" in the last 168 hours.  BNP Recent Labs  Lab 03/02/22 2153  BNP 1,301.2*    DDimer No results for input(s): "DDIMER" in the last 168 hours.   Lipid Panel     Component Value Date/Time   CHOL 168 03/07/2022 0043   TRIG 136 03/07/2022 0043   HDL 47 03/07/2022 0043   CHOLHDL 3.6 03/07/2022 0043   VLDL 27 03/07/2022 0043   LDLCALC 94 03/07/2022 0043     Radiology    DG CHEST PORT 1 VIEW  Result Date: 03/07/2022 CLINICAL DATA:  Acute respiratory failure EXAM: PORTABLE CHEST 1 VIEW COMPARISON:  03/06/2022 FINDINGS: Stable perihilar airspace opacities. Borderline enlargement of the cardiopericardial silhouette. Minimal blunting of the left lateral costophrenic angle. Right lateral seventh rib deformity compatible with age-indeterminate fracture. Atherosclerotic calcification of the aortic arch. IMPRESSION: 1. Stable perihilar airspace opacities favoring pulmonary edema over pneumonia. 2. Borderline enlargement of the cardiopericardial silhouette. 3. Right lateral seventh rib deformity compatible with age-indeterminate fracture. 4. Minimal blunting of the left lateral costophrenic angle, possibly from a trace left pleural effusion or pleural thickening. Electronically Signed   By: Van Clines M.D.   On: 03/07/2022 09:34   DG CHEST PORT 1 VIEW  Result Date: 03/06/2022 CLINICAL DATA:  Shortness of breath. EXAM: PORTABLE CHEST 1 VIEW COMPARISON:  03/04/2022 FINDINGS: Interval removal of left IJ catheter, endotracheal tube catheter endotracheal tube, and enteric tube. Stable cardiac enlargement. Multifocal airspace disease throughout the right lung is unchanged from previous exam. New left upper lobe airspace opacity. Partial improved aeration to the left lung base. Small pleural effusions suspected. IMPRESSION: 1. Interval removal of support apparatus. 2. New left upper lobe airspace opacity. 3. Improved aeration to the left lung base. Electronically Signed    By: Kerby Moors M.D.   On: 03/06/2022 18:13    Cardiac Studies   Echo: 03/2011   Study Conclusions   - Left ventricle: The cavity size was normal. Systolic  function was mildly reduced. The estimated ejection    fraction was in the range of 45% to 50%. Doppler    parameters are consistent with abnormal left ventricular    relaxation (grade 1 diastolic dysfunction).  - Aortic valve: Mild regurgitation.  - Mitral valve: Mild regurgitation.  - Left atrium: The atrium was moderately dilated.  - Right ventricle: The cavity size was mildly dilated. Wall    thickness was normal.  - Right atrium: The atrium was mildly dilated.  Echocardiography.  M-mode, complete 2D, spectral Doppler,  and color Doppler.  Height:  Height: 182.9cm. Height: 72in.  Weight:  Weight: 78kg. Weight: 171.6lb.  Body mass index:  BMI: 23.3kg/m^2.  Body surface area:    BSA: 37m2.  Blood  pressure:     163/91.  Patient status:  Outpatient.  [facility]    Echo: 03/04/2022 IMPRESSIONS   1. Left ventricular ejection fraction, by estimation, is 40 to 45%. The  left ventricle has mildly decreased function. The left ventricle  demonstrates global hypokinesis. There is mild left ventricular  hypertrophy. Left ventricular diastolic parameters  are consistent with Grade I diastolic dysfunction (impaired relaxation).   2. Right ventricular systolic function is mildly reduced. The right  ventricular size is normal. Tricuspid regurgitation signal is inadequate  for assessing PA pressure.   3. Moderate pleural effusion.   4. Mild mitral valve regurgitation.   5. Aortic valve regurgitation is mild.   6. The inferior vena cava is normal in size with <50% respiratory  variability, suggesting right atrial pressure of 8 mmHg.   Comparison(s): No prior Echocardiogram.   FINDINGS   Left Ventricle: Left ventricular ejection fraction, by estimation, is 40  to 45%. The left ventricle has mildly decreased  function. The left  ventricle demonstrates global hypokinesis. The left ventricular internal  cavity size was normal in size. There is   mild left ventricular hypertrophy. Left ventricular diastolic parameters  are consistent with Grade I diastolic dysfunction (impaired relaxation).   Right Ventricle: The right ventricular size is normal. Right ventricular  systolic function is mildly reduced. Tricuspid regurgitation signal is  inadequate for assessing PA pressure.   Left Atrium: Left atrial size was normal in size.   Right Atrium: Right atrial size was normal in size.   Pericardium: There is no evidence of pericardial effusion.   Mitral Valve: Mild mitral valve regurgitation.   Tricuspid Valve: Tricuspid valve regurgitation is not demonstrated.   Aortic Valve: Aortic valve regurgitation is mild. Aortic regurgitation PHT  measures 775 msec. Aortic valve mean gradient measures 4.0 mmHg. Aortic  valve peak gradient measures 8.0 mmHg. Aortic valve area, by VTI measures  3.31 cm.   Pulmonic Valve: Pulmonic valve regurgitation is mild.   Aorta: The aortic root and ascending aorta are structurally normal, with  no evidence of dilitation.   Venous: The inferior vena cava is normal in size with less than 50%  respiratory variability, suggesting right atrial pressure of 8 mmHg.   IAS/Shunts: The interatrial septum was not well visualized.   Additional Comments: There is a moderate pleural effusion.      Patient Profile     82y.o. male who has a history of end-stage renal disease on peritoneal dialysis, hypertension, hypothyroidism, hyperlipidemia, and mild mitral regurgitation.  He has been found to have acute hypoxic respiratory failure secondary to significant pneumonia and ruled in for non-STEMI.  Assessment & Plan    Non-STEMI (suspect type II): Prior  ECGs with inferolateral ST changes.  Repeat ECG today continues to show inferolateral/latereal ST-T abnormalities.  Patient  feels well.  No chest pain or significant shortness of breath.  Patient has troponin elevation with flat plateau consistent with demand ischemia rather than ACS.  As per Dr. Martinique, no plans for invasive evaluation at this time.  Prior to his significant pneumonia he was very active doing yard work with no exertional chest pain symptomatology. Acute hypoxic respiratory failure/pneumonia.  Extubated 03/05/2022. he continues to be on vancomycin in addition to Rocephin.  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  Normal RV size with mild reduction in function.  Mild aortic and mitral regurgitation.  Suspect global hypokinesis related to ongoing infectious process. End-stage renal disease on peritoneal dialysis.  Seen by Dr. Royce Macadamia today. Hyperlipidemia: Not on therapy.  LDL 122 in November 2023.  Repeat lipid panel with LDL at 94.  High likelihood for CAD, will initiate low-dose atorvastatin 20 mg daily. Macrocytic anemia: MCV 102.8      For questions or updates, please contact [place] Please consult [email or web address] for contact info under        Signed, Shelva Majestic, MD  03/07/2022, 11:06 AM

## 2022-03-07 NOTE — Progress Notes (Signed)
   NAME:  Gregroy [name], MRN:  [id number], DOB:  01-Apr-1940, LOS: 4 ADMISSION DATE:  03/02/2022, CONSULTATION DATE:  03/03/22 REFERRING MD:  Loleta Books, CHIEF COMPLAINT:  SOB   History of Present Illness:   82 year old man with hx of CHF, ESRD on PD, HTN, HLD presenting with cough, DOE, intermittent sputum production.  Denies sick contacts.  +subjective fevers.  CXR showing RUL PNA.  O2 needs have escalated rapidly and CXR deteriorated.  PCCM consulted.   Pertinent  Medical History  ESRD on PD Aortic root dilation Mildly reduced EF HTN BPH  Interim History / Subjective:   Extubated 1/1 Transfer out of ICU on 1/2 Overnight he had increasing agitation, confusion and was started on Precedex drip and brought back to ICU. Taken off BiPAP and placed on high flow 5 L  Objective   Blood pressure 93/67, pulse 62, temperature 97.7 F (36.5 C), temperature source Oral, resp. rate 16, height 6' (1.829 m), weight 85.6 kg, SpO2 99 %.    FiO2 (%):  [60 %] 60 %   Intake/Output Summary (Last 24 hours) at 03/07/2022 0829 Last data filed at 03/07/2022 0600 Gross per 24 hour  Intake 112.27 ml  Output 1048 ml  Net -935.73 ml   Filed Weights   03/06/22 0645 03/07/22 0228 03/07/22 0400  Weight: 82.3 kg 86.3 kg 85.6 kg   Examination: Gen:      No acute distress HEENT:  EOMI, sclera anicteric Neck:     No masses; no thyromegaly Lungs:   Scattered rhonchi CV:         Regular rate and rhythm; no murmurs Abd:      + bowel sounds; soft, non-tender; no palpable masses, no distension Ext:    No edema; adequate peripheral perfusion Skin:      Warm and dry; no rash Neuro: Calm, somnolent.  Able to answer questions  Labs/imaging reviewed Significant for BUN/creatinine 73/6.74, WBC 12.3, hemoglobin 9.6, platelets 352 New imaging  Assessment & Plan:  Acute hypoxemic respiratory failure secondary to right-sided community-acquired pneumonia  Wean down oxygen as tolerated Continue IV ceftriaxone finished  a course of azithromycin and vancomycin Cultures have been negative so far, including viral panel is negative  Agitated delirium Back in the ICU on Precedex drip. Start Seroquel.  Monitor QTc  ESRD on PD Continue peritoneal dialysis per nephrology  HTN Holding Hypertension medications Continue lasix  Acute biventricular HFrEF Demand ischemia Appreciate cardiology input No plan for cardiac intervention at this time  Best Practice (right click and "Reselect all SmartList Selections" daily)   Diet/type: Regular diet DVT prophylaxis: prophylactic heparin  GI prophylaxis: Pantoprazole Lines: NA Foley: NA Code Status:  DNR Last date of multidisciplinary goals of care discussion 1/2: [family updated at the bedside.]    The patient is critically ill with multiple organ system failure and requires high complexity decision making for assessment and support, frequent evaluation and titration of therapies, advanced monitoring, review of radiographic studies and interpretation of complex data.   Critical Care Time devoted to patient care services, exclusive of separately billable procedures, described in this note is 35 minutes.   Marshell Garfinkel MD [place] See Amion for pager  If no response to pager , please call [phone] until 7pm After 7:00 pm call Elink  [phone] 03/07/2022, 9:02 AM

## 2022-03-07 NOTE — Progress Notes (Signed)
Physical Therapy Treatment Patient Details Name: William [name] MRN: [id number] DOB: [birth year] Today's Date: 03/07/2022   History of Present Illness Pt is a 82 y.o. M who presents 03/02/2022 with cough, DOE, intermittent sputum production. CXR showing RUL PNA. Intubated 12/30-1/1. Transfer out of ICU 1/2 and then back to ICU due to confusion and agitation as well as resp distress.  Significant PMH: CHF, ESRD on PD, HTN, HLD.    PT Comments    Pt admitted with above diagnosis. Pt was able to pivot to 3N1 with min assist and cues. Pt struggles due to he was worried about having BM. Also pt confused today.  Will continue to follow acutely and recommend 24 hour care with HHPT at d/c.  Pt currently with functional limitations due to balance and endurance deficits. Pt will benefit from skilled PT to increase their independence and safety with mobility to allow discharge to the venue listed below.      Recommendations for follow up therapy are one component of a multi-disciplinary discharge planning process, led by the attending physician.  Recommendations may be updated based on patient status, additional functional criteria and insurance authorization.  Follow Up Recommendations  Home health PT (24 hour care)     Assistance Recommended at Discharge Frequent or constant Supervision/Assistance  Patient can return home with the following A little help with walking and/or transfers;A little help with bathing/dressing/bathroom;Assistance with cooking/housework;Assist for transportation;Help with stairs or ramp for entrance   Equipment Recommendations  None recommended by PT (pt reports he has a RW)    Recommendations for Other Services       Precautions / Restrictions Precautions Precautions: Fall Restrictions Weight Bearing Restrictions: No     Mobility  Bed Mobility Overal bed mobility: Needs Assistance Bed Mobility: Supine to Sit     Supine to sit: Supervision, Min guard      General bed mobility comments: a little assist for safety.  Pt kept saying he had to urinate and informed pt he has primo fit however he didnt seem to understand.    Transfers Overall transfer level: Needs assistance Equipment used: Rolling walker (2 wheels) Transfers: Sit to/from Stand Sit to Stand: Min guard           General transfer comment: Pt stood to RW and stated he needed to have BM. Obtained 3N1 and pt wa able to use the 3N1. Total assist to clean pt.  Then pt pivoted and took steps to recliner as he reports fatigue.    Ambulation/Gait Ambulation/Gait assistance: Min assist Gait Distance (Feet): 3 Feet Assistive device: Rolling walker (2 wheels) Gait Pattern/deviations: Step-through pattern, Decreased stride length, Drifts right/left Gait velocity: decreased Gait velocity interpretation: <1.31 ft/sec, indicative of household ambulator   General Gait Details: Pt requiring minA for balance with use of RW. Pt reports too fatigued to walk a fruther distance.   Stairs             Wheelchair Mobility    Modified Rankin (Stroke Patients Only)       Balance Overall balance assessment: Needs assistance Sitting-balance support: Feet supported Sitting balance-Leahy Scale: Good     Standing balance support: During functional activity, Bilateral upper extremity supported, Reliant on assistive device for balance Standing balance-Leahy Scale: Poor                              Cognition Arousal/Alertness: Awake/alert Behavior During Therapy: Impulsive Overall Cognitive Status: Within  Functional Limits for tasks assessed                                 General Comments: Mildly impulsive        Exercises      General Comments        Pertinent Vitals/Pain Pain Assessment Pain Assessment: No/denies pain    Home Living Family/patient expects to be discharged to:: Private residence Living Arrangements: Spouse/significant  other;Children (daughter) Available Help at Discharge: Family Type of Home: House Home Access: Stairs to enter   [employer] of Steps: 2 (through garage; no steps through the sun room)   Home Layout: One level Home Equipment: BSC/3in1;Rolling Walker (2 wheels);Cane - single point;Wheelchair - manual (mother in [place] old equipment)      Prior Function            PT Goals (current goals can now be found in the care plan section) Acute Rehab PT Goals Patient Stated Goal: to get out of bed Progress towards PT goals: Progressing toward goals    Frequency    Min 3X/week      PT Plan Discharge plan needs to be updated    Co-evaluation              AM-PAC PT "6 Clicks" Mobility   Outcome Measure  Help needed turning from your back to your side while in a flat bed without using bedrails?: None Help needed moving from lying on your back to sitting on the side of a flat bed without using bedrails?: A Little Help needed moving to and from a bed to a chair (including a wheelchair)?: A Little Help needed standing up from a chair using your arms (e.g., wheelchair or bedside chair)?: A Little Help needed to walk in hospital room?: A Little Help needed climbing 3-5 steps with a railing? : A Lot 6 Click Score: 18    End of Session Equipment Utilized During Treatment: Gait belt Activity Tolerance: Patient limited by fatigue Patient left: in chair;with call bell/phone within reach;with chair alarm set;with family/visitor present Nurse Communication: Mobility status PT Visit Diagnosis: Unsteadiness on feet (R26.81);Difficulty in walking, not elsewhere classified (R26.2)     Time: 1610-9604 PT Time Calculation (min) (ACUTE ONLY): 33 min  Charges:  $Gait Training: 23-37 mins                     Kourtnee Lahey M,PT Acute Rehab Services [place] 03/07/2022, 1:44 PM

## 2022-03-07 NOTE — Progress Notes (Signed)
[facility] Progress Note Patient Name: William [name] DOB: 1940-12-30 MRN: [id number]   Date of Service  03/07/2022  HPI/Events of Note  Nursing reports 9 beat run of VT. Morning labs pending.   eICU Interventions  Await AM labs.      Intervention Category Major Interventions: Arrhythmia - evaluation and management  Gerda Yin [place] 03/07/2022, 5:42 AM

## 2022-03-07 NOTE — Progress Notes (Signed)
Hearing aids noted in patient's ears. Pt requested to remove them for bed time. Pt and pt's granddaughter unable to find hearing aids case, placed hearing aids in pink denture cup with patient label, and labeled cup 'hearing aids'. Patient's granddaughter placed the cup with patient's other belongings at bedside.

## 2022-03-07 NOTE — Progress Notes (Signed)
[facility] Progress Note Patient Name: William [name] DOB: [birth year] MRN: [id number]   Date of Service  03/07/2022  HPI/Events of Note  Nursing reports that Haldol was not effective. Patient remains agitated and is still attempting to remove BiPAP. No video camera in room.   eICU Interventions  Plan: Will request that the PCCM ground team evaluate the patient at bedside.      Intervention Category Major Interventions: Delirium, psychosis, severe agitation - evaluation and management  Cleatus Goodin [place] 03/07/2022, 12:30 AM

## 2022-03-07 NOTE — TOC Initial Note (Signed)
Transition of Care [facility]) - Initial/Assessment Note    Patient Details  Name: William [name] MRN: [id number] Date of Birth: Aug 02, 1940  Transition of [facility]) CM/SW Contact:    Tom-Johnson, Renea Ee, RN Phone Number: 03/07/2022, 12:58 PM  Clinical Narrative:                  CM spoke with patient and daughter, [name] at bedside about needs for discharge transition.  Admitted for ARF with Hypoxia, currently on 5L HFNC, does not use home O2. On IV abx.  Patient states he is from home with wife. Independent with care and drive self prior to admission. Does not have DME's at home. On Peritoneal Dialysis at home by .   PCP is Podraza, Sindy Messing, PA-C and uses [employer]. No PT f/u noted. Family to transport at discharge.  CM will continue to follow as patient progresses with care towards discharge.    Expected Discharge Plan: Home/Self Care Barriers to Discharge: Continued Medical Work up   Patient Goals and CMS Choice Patient states their goals for this hospitalization and ongoing recovery are:: To return home CMS Medicare.gov Compare Post Acute Care list provided to:: Patient Choice offered to / list presented to : Patient, Adult Children [name]- Daughter)      Expected Discharge Plan and Services   Discharge Planning Services: CM Consult Post Acute Care Choice: NA Living arrangements for the past 2 months: Single Family Home                           HH Arranged: NA [place] Agency: NA        Prior Living Arrangements/Services Living arrangements for the past 2 months: Single Family Home Lives with:: Spouse Patient language and need for interpreter reviewed:: Yes Do you feel safe going back to the place where you live?: Yes      Need for Family Participation in Patient Care: Yes (Comment) Care giver support system in place?: Yes (comment)   Criminal Activity/Legal Involvement Pertinent to Current Situation/Hospitalization: No - Comment  as needed  Activities of Daily Living Home Assistive Devices/Equipment: None ADL Screening (condition at time of admission) Patient's cognitive ability adequate to safely complete daily activities?: Yes Is the patient deaf or have difficulty hearing?: No Does the patient have difficulty seeing, even when wearing glasses/contacts?: No Does the patient have difficulty concentrating, remembering, or making decisions?: No Patient able to express need for assistance with ADLs?: Yes Does the patient have difficulty dressing or bathing?: No Independently performs ADLs?: Yes (appropriate for developmental age) Does the patient have difficulty walking or climbing stairs?: No Weakness of Legs: None Weakness of Arms/Hands: None  Permission Sought/Granted Permission sought to share information with : Case Manager, Family Supports Permission granted to share information with : Yes, Verbal Permission Granted              Emotional Assessment Appearance:: Appears stated age Attitude/Demeanor/Rapport: Engaged, Gracious Affect (typically observed): Accepting, Appropriate, Calm, Hopeful, Pleasant Orientation: : Oriented to Self, Oriented to Place, Oriented to  Time, Oriented to Situation Alcohol / Substance Use: Not Applicable Psych Involvement: No (comment)  Admission diagnosis:  Pneumonia [J18.9] Elevated troponin [R79.89] Acute respiratory failure with hypoxia ([place]) [J96.01] Community acquired pneumonia, unspecified laterality [J18.9] Patient Active Problem List   Diagnosis Date Noted   Acute respiratory failure with hypoxia ([place]) 03/03/2022   Community acquired pneumonia 03/03/2022   Hyponatremia 03/03/2022   Anemia in  chronic kidney disease (CKD) 16/12/9602   Acute metabolic acidosis 54/11/8117   Bilateral impacted cerumen 03/05/2019   Sensorineural hearing loss (SNHL) of both ears 03/05/2019   Tendinitis of right peroneus brevis tendon 02/26/2019   Parotid nodule 02/05/2019    Thyroid nodule 02/05/2019   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[place]) 08/18/2018   Mitral valve disorder 02/19/2018   Impaired fasting glucose 02/03/2018   Proteinuria 04/04/2017   Impotence 10/02/2016   Normocytic anemia 07/13/2016   Subclinical hypothyroidism 07/13/2016   Lesion of neck 10/26/2015   Non-rheumatic mitral regurgitation 08/08/2015   Functional diarrhea 07/27/2015   Gastroesophageal reflux disease 12/30/2014   Leg pain, right 07/21/2014   ESRD (end stage renal disease) ([place]) 07/01/2014   End stage renal disease ([place]) 07/01/2014   Osteoporosis 10/26/2013   Benign non-nodular prostatic hyperplasia with lower urinary tract symptoms 10/15/2013   Elevated prostate specific antigen (PSA) 10/15/2013   Aortic insufficiency    Hypertension    Renal insufficiency    Hyperlipidemia    PCP:  [name], PA-C Pharmacy:   [facility], [place] [address] Phone: [phone] Fax: [place], [place] - [address] [place] [address] Phone: [phone] Fax: [phone]     Social Determinants of Health (SDOH) Social History: SDOH Screenings   Tobacco Use: Medium Risk (03/03/2022)   SDOH Interventions: Transportation Interventions: Intervention Not Indicated, Inpatient TOC, Patient Resources (Friends/Family)   Readmission Risk Interventions     No data to display

## 2022-03-07 NOTE — Progress Notes (Signed)
Pt receives his PD care through [facility]. Clinic aware pt is hospitalized. Will assist as needed.   Melven Sartorius Renal Navigator [phone]

## 2022-03-07 NOTE — Progress Notes (Signed)
Attempted to get blood gas but patient is combative and adamant that he does not want his blood taken. Myself, his wife, and his nurse tried to assure him that the blood was only going to help [place] further know how to treat his current condition but he was unwilling to allow me to attempt a stick. Physician contacted.
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
## 2022-03-08 ENCOUNTER — Inpatient Hospital Stay (HOSPITAL_COMMUNITY): Payer: PPO

## 2022-03-08 DIAGNOSIS — J189 Pneumonia, unspecified organism: Secondary | ICD-10-CM | POA: Diagnosis not present

## 2022-03-08 DIAGNOSIS — J9601 Acute respiratory failure with hypoxia: Secondary | ICD-10-CM | POA: Diagnosis not present

## 2022-03-08 DIAGNOSIS — R9431 Abnormal electrocardiogram [ECG] [EKG]: Secondary | ICD-10-CM | POA: Diagnosis not present

## 2022-03-08 DIAGNOSIS — R7989 Other specified abnormal findings of blood chemistry: Secondary | ICD-10-CM

## 2022-03-08 DIAGNOSIS — N186 End stage renal disease: Secondary | ICD-10-CM | POA: Diagnosis not present

## 2022-03-08 LAB — CBC
HCT: 22.9 % — ABNORMAL LOW (ref 39.0–52.0)
HCT: 29 % — ABNORMAL LOW (ref 39.0–52.0)
Hemoglobin: 7.9 g/dL — ABNORMAL LOW (ref 13.0–17.0)
Hemoglobin: 9.8 g/dL — ABNORMAL LOW (ref 13.0–17.0)
MCH: 34.5 pg — ABNORMAL HIGH (ref 26.0–34.0)
MCH: 33.9 pg (ref 26.0–34.0)
MCHC: 34.5 g/dL (ref 30.0–36.0)
MCHC: 33.8 g/dL (ref 30.0–36.0)
MCV: 100 fL (ref 80.0–100.0)
MCV: 100.3 fL — ABNORMAL HIGH (ref 80.0–100.0)
Platelets: 273 10*3/uL (ref 150–400)
Platelets: 346 10*3/uL (ref 150–400)
RBC: 2.29 MIL/uL — ABNORMAL LOW (ref 4.22–5.81)
RBC: 2.89 MIL/uL — ABNORMAL LOW (ref 4.22–5.81)
RDW: 11.9 % (ref 11.5–15.5)
RDW: 12.3 % (ref 11.5–15.5)
WBC: 10.3 10*3/uL (ref 4.0–10.5)
WBC: 14.5 10*3/uL — ABNORMAL HIGH (ref 4.0–10.5)
nRBC: 1.1 % — ABNORMAL HIGH (ref 0.0–0.2)
nRBC: 0.6 % — ABNORMAL HIGH (ref 0.0–0.2)

## 2022-03-08 LAB — BASIC METABOLIC PANEL
Anion gap: 15 (ref 5–15)
BUN: 71 mg/dL — ABNORMAL HIGH (ref 8–23)
CO2: 22 mmol/L (ref 22–32)
Calcium: 7.9 mg/dL — ABNORMAL LOW (ref 8.9–10.3)
Chloride: 98 mmol/L (ref 98–111)
Creatinine, Ser: 6.51 mg/dL — ABNORMAL HIGH (ref 0.61–1.24)
GFR, Estimated: 8 mL/min — ABNORMAL LOW (ref >60)
Glucose, Bld: 111 mg/dL — ABNORMAL HIGH (ref 70–99)
Potassium: 3.5 mmol/L (ref 3.5–5.1)
Sodium: 135 mmol/L (ref 135–145)

## 2022-03-08 LAB — COMPREHENSIVE METABOLIC PANEL
ALT: 31 U/L (ref 0–44)
AST: 32 U/L (ref 15–41)
Albumin: 2.8 g/dL — ABNORMAL LOW (ref 3.5–5.0)
Alkaline Phosphatase: 49 U/L (ref 38–126)
Anion gap: 18 — ABNORMAL HIGH (ref 5–15)
BUN: 76 mg/dL — ABNORMAL HIGH (ref 8–23)
CO2: 21 mmol/L — ABNORMAL LOW (ref 22–32)
Calcium: 8.3 mg/dL — ABNORMAL LOW (ref 8.9–10.3)
Chloride: 96 mmol/L — ABNORMAL LOW (ref 98–111)
Creatinine, Ser: 6.89 mg/dL — ABNORMAL HIGH (ref 0.61–1.24)
GFR, Estimated: 8 mL/min — ABNORMAL LOW (ref >60)
Glucose, Bld: 129 mg/dL — ABNORMAL HIGH (ref 70–99)
Potassium: 3.7 mmol/L (ref 3.5–5.1)
Sodium: 135 mmol/L (ref 135–145)
Total Bilirubin: 0.6 mg/dL (ref 0.3–1.2)
Total Protein: 6.1 g/dL — ABNORMAL LOW (ref 6.5–8.1)

## 2022-03-08 LAB — PHOSPHORUS
Phosphorus: 7.1 mg/dL — ABNORMAL HIGH (ref 2.5–4.6)
Phosphorus: 6.9 mg/dL — ABNORMAL HIGH (ref 2.5–4.6)

## 2022-03-08 LAB — CULTURE, BLOOD (ROUTINE X 2)
Culture: NO GROWTH
Culture: NO GROWTH
Special Requests: ADEQUATE
Special Requests: ADEQUATE

## 2022-03-08 LAB — MAGNESIUM
Magnesium: 2.2 mg/dL (ref 1.7–2.4)
Magnesium: 2.2 mg/dL (ref 1.7–2.4)

## 2022-03-08 LAB — GLUCOSE, CAPILLARY
Glucose-Capillary: 103 mg/dL — ABNORMAL HIGH (ref 70–99)
Glucose-Capillary: 108 mg/dL — ABNORMAL HIGH (ref 70–99)
Glucose-Capillary: 109 mg/dL — ABNORMAL HIGH (ref 70–99)
Glucose-Capillary: 125 mg/dL — ABNORMAL HIGH (ref 70–99)
Glucose-Capillary: 129 mg/dL — ABNORMAL HIGH (ref 70–99)
Glucose-Capillary: 162 mg/dL — ABNORMAL HIGH (ref 70–99)
Glucose-Capillary: 186 mg/dL — ABNORMAL HIGH (ref 70–99)

## 2022-03-08 LAB — TROPONIN I (HIGH SENSITIVITY)
Troponin I (High Sensitivity): 2478 ng/L (ref <18)
Troponin I (High Sensitivity): 2695 ng/L (ref <18)
Troponin I (High Sensitivity): 2560 ng/L (ref <18)
Troponin I (High Sensitivity): 1534 ng/L (ref <18)
Troponin I (High Sensitivity): 1415 ng/L (ref <18)

## 2022-03-08 LAB — LACTIC ACID, PLASMA
Lactic Acid, Venous: 1.5 mmol/L (ref 0.5–1.9)
Lactic Acid, Venous: 1.3 mmol/L (ref 0.5–1.9)

## 2022-03-08 LAB — ABO/RH: ABO/RH(D): A POS

## 2022-03-08 LAB — PREPARE RBC (CROSSMATCH)

## 2022-03-08 MED ORDER — MELATONIN 3 MG PO TABS
3.0000 mg | ORAL_TABLET | Freq: Every day | ORAL | Status: DC
  Administered 2022-03-08 – 2022-03-14 (×7): 3 mg via ORAL
  Filled 2022-03-08 (×7): qty 1

## 2022-03-08 MED ORDER — FUROSEMIDE 10 MG/ML IJ SOLN
80.0000 mg | Freq: Once | INTRAMUSCULAR | Status: AC
  Administered 2022-03-08: 80 mg via INTRAVENOUS
  Filled 2022-03-08: qty 8

## 2022-03-08 MED ORDER — SODIUM CHLORIDE 0.9% IV SOLUTION: Freq: Once | INTRAVENOUS | Status: DC

## 2022-03-08 MED ORDER — QUETIAPINE FUMARATE 50 MG PO TABS
50.0000 mg | ORAL_TABLET | Freq: Every day | ORAL | Status: DC
  Administered 2022-03-08 – 2022-03-12 (×5): 50 mg via ORAL
  Filled 2022-03-08 (×5): qty 1

## 2022-03-08 MED ORDER — CALCIUM CARBONATE ANTACID 500 MG PO CHEW
400.0000 mg | CHEWABLE_TABLET | Freq: Four times a day (QID) | ORAL | Status: DC | PRN
  Administered 2022-03-08 – 2022-03-14 (×4): 400 mg via ORAL
  Filled 2022-03-08 (×4): qty 2

## 2022-03-08 MED ORDER — METOPROLOL TARTRATE 5 MG/5ML IV SOLN
2.5000 mg | INTRAVENOUS | Status: DC | PRN
  Administered 2022-03-08: 2.5 mg via INTRAVENOUS
  Filled 2022-03-08: qty 5

## 2022-03-08 MED ORDER — POTASSIUM CHLORIDE CRYS ER 20 MEQ PO TBCR
20.0000 meq | EXTENDED_RELEASE_TABLET | Freq: Once | ORAL | Status: AC
  Administered 2022-03-08: 20 meq via ORAL
  Filled 2022-03-08: qty 1

## 2022-03-08 MED ORDER — POLYETHYLENE GLYCOL 3350 17 G PO PACK: 17.0000 g | PACK | Freq: Every day | ORAL | Status: DC | PRN

## 2022-03-08 MED ORDER — POLYETHYLENE GLYCOL 3350 17 G PO PACK: 17.0000 g | PACK | Freq: Once | ORAL | Status: DC

## 2022-03-08 MED ORDER — IPRATROPIUM-ALBUTEROL 0.5-2.5 (3) MG/3ML IN SOLN: 3.0000 mL | RESPIRATORY_TRACT | Status: DC | PRN

## 2022-03-08 MED ORDER — PROCHLORPERAZINE EDISYLATE 10 MG/2ML IJ SOLN
10.0000 mg | INTRAMUSCULAR | Status: DC | PRN
  Administered 2022-03-08 – 2022-03-12 (×5): 10 mg via INTRAVENOUS
  Filled 2022-03-08 (×7): qty 2

## 2022-03-08 NOTE — Progress Notes (Signed)
PCCM INTERVAL PROGRESS NOTE  Called to bedside for patient with abdominal pain, headache, and respiratory distress. Upon my arrival to bedside he appeared quite uncomfortable and complained of generalized abdominal pain and nausea. Also dyspneic. No chest pain. ST changes and elevated troponin earlier today. Repeat EKG unchanged from earlier today. Symptoms somewhat improved with zofran. Notably he had a hemoglobin drop this morning and is receiving blood. Symptoms started well before blood administration.   Plan: - CT abdomen to rule out RP bleed - CXR - CBC - CMP - Lactic - Troponin   Georgann Housekeeper, AGACNP-BC [facility]  See Amion for personal pager PCCM on call pager [phone] until 7pm. Please call Elink 7p-7a. [phone]  03/08/2022 4:32 PM

## 2022-03-08 NOTE — Progress Notes (Signed)
Couldn't document the 4.25% dextrose pd solution on the MAR. Per the MD's order in the comments section of the PD order set, "Mix 4.25% and 2.5% dextrose solutions", however, the change didn't get made to the [facility].

## 2022-03-08 NOTE — Progress Notes (Signed)
[place] Progress Note Patient Name: William [name] DOB: 07-07-1940 MRN: [id number]   Date of Service  03/08/2022  HPI/Events of Note  Patient c/o reflux and requests TUMS.   eICU Interventions  Plan: TUMS 2 tablets PO Q 6 hours PRN indigestion or heartburn.      Intervention Category Major Interventions: Other:  [name] Cornelia Copa 03/08/2022, 1:43 AM

## 2022-03-08 NOTE — Progress Notes (Addendum)
[place] Progress Note Patient Name: William [name] DOB: 1940-07-29 MRN: [id number]   Date of Service  03/08/2022  HPI/Events of Note  EKG alarming for ST elevation. 12 Lead EKG reveals sinus tachycardia (HR = 118), non-specific intra-ventricular conduction delay and marked ST abnormality, possible inferior subendocardial injury. Patient denies symptoms. No chest pain or significant shortness of breath.  Likely related to increased HR and demand ischemia vs related to IVCD. BP = 141/109 with MAP = 120. Patient is followed by cardiology with prior ECGs with inferolateral ST changes. Patient has had recent troponin elevation with flat plateau consistent with demand ischemia rather than ACS.   eICU Interventions  Plan: Cycle Troponin. HR control with Metoprolol 2.5 mg IV Q 3 hours PRN HR > 115. Hold dose for SBP < 105.      Intervention Category Major Interventions: Other:  Lysle Dingwall 03/08/2022, 12:10 AM

## 2022-03-08 NOTE — Progress Notes (Signed)
Rounding Note    Patient Name: William [name] Date of Encounter: 03/08/2022  [place] Cardiologist: None   Subjective   Transferred to 60M overnight; no chest pain.  Cough has improved  Inpatient Medications    Scheduled Meds:  aspirin  81 mg Oral Daily   atorvastatin  20 mg Oral Daily   Chlorhexidine Gluconate Cloth  6 each Topical Daily   darbepoetin (ARANESP) injection - NON-DIALYSIS  200 mcg Subcutaneous Q Mon-1800   gentamicin cream  1 Application Topical Daily   heparin injection (subcutaneous)  5,000 Units Subcutaneous Q8H   insulin aspart  1-3 Units Subcutaneous Q4H   insulin detemir  5 Units Subcutaneous Q12H   melatonin  3 mg Oral QHS   methylPREDNISolone (SOLU-MEDROL) injection  40 mg Intravenous Daily   pantoprazole  40 mg Oral Daily   polyethylene glycol  17 g Oral Once   QUEtiapine  50 mg Oral QHS   sevelamer carbonate  800 mg Oral TID WC   sodium chloride flush  10-40 mL Intracatheter Q12H   Continuous Infusions:  sodium chloride Stopped (03/04/22 1031)   cefTRIAXone (ROCEPHIN)  IV 2 g (03/07/22 2153)   dexmedetomidine (PRECEDEX) IV infusion 0.8 mcg/kg/hr (03/08/22 0441)   dialysis solution 1.5% low-MG/low-CA     dialysis solution 2.5% low-MG/low-CA     PRN Meds: sodium chloride, acetaminophen **OR** acetaminophen, calcium carbonate, haloperidol lactate, ipratropium-albuterol, metoprolol tartrate, ondansetron **OR** ondansetron (ZOFRAN) IV, mouth rinse, polyethylene glycol, sodium chloride flush, white petrolatum   Vital Signs    Vitals:   03/08/22 0930 03/08/22 1000 03/08/22 1030 03/08/22 1121  BP: 112/69  130/71   Pulse: 64 73 75   Resp: (!) 21 (!) 24 (!) 30   Temp:    97.6 F (36.4 C)  TempSrc:    Oral  SpO2: 96% 95% (!) 85%   Weight:      Height:        Intake/Output Summary (Last 24 hours) at 03/08/2022 1145 Last data filed at 03/08/2022 0657 Gross per 24 hour  Intake 120 ml  Output 1033 ml  Net -913 ml      03/08/2022     6:57 AM 03/07/2022    5:28 PM 03/07/2022    4:00 AM  Last 3 Weights  Weight (lbs) 185 lb 10 oz 184 lb 15.5 oz 188 lb 11.4 oz  Weight (kg) 84.2 kg 83.9 kg 85.6 kg      Telemetry    Sinus rhythm at 80 - Personally Reviewed  ECG   03/08/2022 ECG (independently read by me): NSR at 71, slighlty more pronounced STT changes  I, aVL, V3-6  03/07/2022 ECG (independently read by me): Normal sinus rhythm at 77, ST segment changes 1, aVL, V4 through V6.   03/03/23 ECG (independently read by me): NSR inferolater ST changes - Personally Reviewed  Physical Exam   GEN: No acute distress.  Breathing more comfortably.  Occasional cough. Neck: No JVD Cardiac: Regular heart rate and rhythm in the 80s.  Faint 1/6 systolic murmur.  No audible diastolic murmur. Respiratory: Slightly decreased breath sounds most prominent at bases GI: Soft, nontender, non-distended  MS: No edema; No deformity. Neuro:  Nonfocal  Psych: Normal affect   Labs    High Sensitivity Troponin:   Recent Labs  Lab 03/04/22 0036 03/04/22 0220 03/08/22 0553 03/08/22 0756 03/08/22 0917  TROPONINIHS 912* 933* 2,478* 2,695* 2,560*     Chemistry Recent Labs  Lab 03/02/22 2153 03/03/22 0133 03/06/22 0630  03/07/22 0043 03/08/22 0553  NA 129*   < > 139 138 135  K 4.3   < > 3.7 3.6 3.5  CL 96*   < > 99 98 98  CO2 20*   < > 21* 21* 22  GLUCOSE 120*   < > 129* 164* 111*  BUN 59*   < > 71* 73* 71*  CREATININE 6.08*   < > 6.67* 6.74* 6.51*  CALCIUM 8.4*   < > 8.4* 8.4* 7.9*  MG  --    < > 2.4 2.2 2.2  PROT 7.1  --   --   --   --   ALBUMIN 3.2*  --   --   --   --   AST 24  --   --   --   --   ALT 18  --   --   --   --   ALKPHOS 52  --   --   --   --   BILITOT 0.3  --   --   --   --   GFRNONAA 9*   < > 8* 8* 8*  ANIONGAP 13   < > 19* 19* 15   < > = values in this interval not displayed.    Lipids  Recent Labs  Lab 03/07/22 0043  CHOL 168  TRIG 136  HDL 47  LDLCALC 94  CHOLHDL 3.6    Hematology Recent Labs   Lab 03/06/22 0313 03/07/22 0043 03/08/22 0553  WBC 13.3* 12.3* 10.3  RBC 2.49* 2.81* 2.29*  HGB 8.5* 9.6* 7.9*  HCT 25.6* 28.4* 22.9*  MCV 102.8* 101.1* 100.0  MCH 34.1* 34.2* 34.5*  MCHC 33.2 33.8 34.5  RDW 12.3 12.1 11.9  PLT 318 352 273   Thyroid No results for input(s): "TSH", "FREET4" in the last 168 hours.  BNP Recent Labs  Lab 03/02/22 2153  BNP 1,301.2*    DDimer No results for input(s): "DDIMER" in the last 168 hours.   Lipid Panel     Component Value Date/Time   CHOL 168 03/07/2022 0043   TRIG 136 03/07/2022 0043   HDL 47 03/07/2022 0043   CHOLHDL 3.6 03/07/2022 0043   VLDL 27 03/07/2022 0043   LDLCALC 94 03/07/2022 0043     Radiology    DG CHEST PORT 1 VIEW  Result Date: 03/07/2022 CLINICAL DATA:  Acute respiratory failure EXAM: PORTABLE CHEST 1 VIEW COMPARISON:  03/06/2022 FINDINGS: Stable perihilar airspace opacities. Borderline enlargement of the cardiopericardial silhouette. Minimal blunting of the left lateral costophrenic angle. Right lateral seventh rib deformity compatible with age-indeterminate fracture. Atherosclerotic calcification of the aortic arch. IMPRESSION: 1. Stable perihilar airspace opacities favoring pulmonary edema over pneumonia. 2. Borderline enlargement of the cardiopericardial silhouette. 3. Right lateral seventh rib deformity compatible with age-indeterminate fracture. 4. Minimal blunting of the left lateral costophrenic angle, possibly from a trace left pleural effusion or pleural thickening. Electronically Signed   By: Van Clines M.D.   On: 03/07/2022 09:34   DG CHEST PORT 1 VIEW  Result Date: 03/06/2022 CLINICAL DATA:  Shortness of breath. EXAM: PORTABLE CHEST 1 VIEW COMPARISON:  03/04/2022 FINDINGS: Interval removal of left IJ catheter, endotracheal tube catheter endotracheal tube, and enteric tube. Stable cardiac enlargement. Multifocal airspace disease throughout the right lung is unchanged from previous exam. New left  upper lobe airspace opacity. Partial improved aeration to the left lung base. Small pleural effusions suspected. IMPRESSION: 1. Interval removal of support apparatus. 2. New left upper  lobe airspace opacity. 3. Improved aeration to the left lung base. Electronically Signed   By: Kerby Moors M.D.   On: 03/06/2022 18:13    Cardiac Studies   Echo: 03/2011   Study Conclusions   - Left ventricle: The cavity size was normal. Systolic    function was mildly reduced. The estimated ejection    fraction was in the range of 45% to 50%. Doppler    parameters are consistent with abnormal left ventricular    relaxation (grade 1 diastolic dysfunction).  - Aortic valve: Mild regurgitation.  - Mitral valve: Mild regurgitation.  - Left atrium: The atrium was moderately dilated.  - Right ventricle: The cavity size was mildly dilated. Wall    thickness was normal.  - Right atrium: The atrium was mildly dilated.  Echocardiography.  M-mode, complete 2D, spectral Doppler,  and color Doppler.  Height:  Height: 182.9cm. Height: 72in.  Weight:  Weight: 78kg. Weight: 171.6lb.  Body mass index:  BMI: 23.3kg/m^2.  Body surface area:    BSA: 4m2.  Blood  pressure:     163/91.  Patient status:  Outpatient.  [facility]    Echo: 03/04/2022 IMPRESSIONS   1. Left ventricular ejection fraction, by estimation, is 40 to 45%. The  left ventricle has mildly decreased function. The left ventricle  demonstrates global hypokinesis. There is mild left ventricular  hypertrophy. Left ventricular diastolic parameters  are consistent with Grade I diastolic dysfunction (impaired relaxation).   2. Right ventricular systolic function is mildly reduced. The right  ventricular size is normal. Tricuspid regurgitation signal is inadequate  for assessing PA pressure.   3. Moderate pleural effusion.   4. Mild mitral valve regurgitation.   5. Aortic valve regurgitation is mild.   6. The inferior vena cava is  normal in size with <50% respiratory  variability, suggesting right atrial pressure of 8 mmHg.   Comparison(s): No prior Echocardiogram.   FINDINGS   Left Ventricle: Left ventricular ejection fraction, by estimation, is 40  to 45%. The left ventricle has mildly decreased function. The left  ventricle demonstrates global hypokinesis. The left ventricular internal  cavity size was normal in size. There is   mild left ventricular hypertrophy. Left ventricular diastolic parameters  are consistent with Grade I diastolic dysfunction (impaired relaxation).   Right Ventricle: The right ventricular size is normal. Right ventricular  systolic function is mildly reduced. Tricuspid regurgitation signal is  inadequate for assessing PA pressure.   Left Atrium: Left atrial size was normal in size.   Right Atrium: Right atrial size was normal in size.   Pericardium: There is no evidence of pericardial effusion.   Mitral Valve: Mild mitral valve regurgitation.   Tricuspid Valve: Tricuspid valve regurgitation is not demonstrated.   Aortic Valve: Aortic valve regurgitation is mild. Aortic regurgitation PHT  measures 775 msec. Aortic valve mean gradient measures 4.0 mmHg. Aortic  valve peak gradient measures 8.0 mmHg. Aortic valve area, by VTI measures  3.31 cm.   Pulmonic Valve: Pulmonic valve regurgitation is mild.   Aorta: The aortic root and ascending aorta are structurally normal, with  no evidence of dilitation.   Venous: The inferior vena cava is normal in size with less than 50%  respiratory variability, suggesting right atrial pressure of 8 mmHg.   IAS/Shunts: The interatrial septum was not well visualized.   Additional Comments: There is a moderate pleural effusion.      Patient Profile     82  y.o. male who has a history of end-stage renal disease on peritoneal dialysis, hypertension, hypothyroidism, hyperlipidemia, and mild mitral regurgitation.  He has been found to have acute  hypoxic respiratory failure secondary to significant pneumonia and ruled in for non-STEMI.  Assessment & Plan    Non-STEMI (suspect type II): Prior ECGs with inferolateral ST changes.  Repeat EKG shows slightly more pronounced ST-T abnormalities inferolaterally, laterally and anterolaterally.  A chest pain or increasing shortness of breath.  Troponins are elevated with a flat plateau consistent with probable demand ischemia.  This may be more abnormal today due to the significant hemoglobin drop 9.6/28.4 to 7.9/22.0.  Consider transfusion packed red blood cell today.  Prior to his hospitalization with significant pneumonia he was without chest pain and continues to be active. Acute hypoxic respiratory failure/pneumonia.  Extubated 03/05/2022. he continues to be on vancomycin in addition to Rocephin.  He was transferred out of to heart ICU to floor, but was transferred back to to MICU due to progressive agitation; on Precedex drip Progressive anemia: No active bright red blood per rectum.  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  Normal RV size with mild reduction in function.  Mild aortic and mitral regurgitation.  Suspect global hypokinesis related to ongoing infectious process. End-stage renal disease on peritoneal dialysis.   Hyperlipidemia: Not on therapy.  LDL 122 in November 2023.  Repeat lipid panel with LDL at 94.  High likelihood for CAD, will initiate low-dose atorvastatin 20 mg daily. 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

## 2022-03-08 NOTE — Progress Notes (Signed)
Nephrology Progress Note:  Subjective:   He remains in the ICU.  He had a gain of 536 mL with PD on 1/3 as below.  He had 850 mL uop over 1/3 charted.  Hypotension overnight and per nursing this is felt secondary to precedex.  He had 183 mL UF overnight using the 2.5% dextrose.  He has been on 8 liters overnight per nursing.   PD UF:  1/4 am - 183 mL UF per machine 1/3 am - gain of 536 mL (reflects 1.5% bags) He had 523 mL UF charted for 1/2 am with PD (reflecting PD over 1/1-1/2 evening).   Review of systems:   Unable to obtain 2/2 precedex  Had 3- 4 small BM's yesterday per nursing   Objective Vital signs in last 24 hours: Vitals:   03/08/22 0300 03/08/22 0330 03/08/22 0400 03/08/22 0405  BP: (!) 96/59 92/61 (!) 90/59   Pulse: (!) 57 (!) 58 (!) 58   Resp: (!) 34 17 16   Temp:    98.1 F (36.7 C)  TempSrc:    Axillary  SpO2: 99% 98% 100%   Weight:      Height:       Weight change: -2.4 kg  Intake/Output Summary (Last 24 hours) at 03/08/2022 0601 Last data filed at 03/07/2022 2200 Gross per 24 hour  Intake -384.47 ml  Output 850 ml  Net -1234.47 ml    OP PD: f/b Dr Smitty Cords from [facility]. 4 exchanges overnight, 2500 cc dwell vol, uses mostly 1.5% and 2.5% bags.  On PD for 3 years. Dry wt 181- 185 per the pt.    Assessment/ Plan: Pt is a 82 y.o. yo male with ESRD on PD who was admitted on 03/02/2022 with PNA but also felt to have NSTEMI  Assessment/Plan: 1. Right sided community acquired PNA-  extubated.  On rocephin/steroids per CCM. S/p azithro  2. ESRD - on PD at home.   - Continue PD here - increase fluid removal goal.  Tube in position on recent assessment  - Poor UF - Mix of 4.25 and 2.5 % dextrose bags tonight.  Increase to 5 cycles - await AM labs - Miralax once today and daily PRN - Lasix 80 mg IV once   3. Acute hypoxemic respiratory failure  - secondary to PNA; also rule out any component of overload by optimizing volume removal with PD  4. Anemia of  CKD -  he is ordered aranesp 200 mcg weekly on Mondays.  Hb improving.  Follow trends  5. Secondary hyperparathyroidism-  hyperphosphatemia however he states he has never been on a binder and that phos is usually controlled.  Have started renvela for now  6. HTN - hypotension overnight which was attributed to precedex. Optimize volume with PD  7. NSTEMI- per cardiology and primary team; bradycardia resolved  Disposition - in the ICU per primary team    Labs: Basic Metabolic Panel: Recent Labs  Lab 03/05/22 0416 03/06/22 0313 03/07/22 0043  NA 135 139 138  K 4.0 3.7 3.6  CL 99 99 98  CO2 22 21* 21*  GLUCOSE 104* 129* 164*  BUN 65* 71* 73*  CREATININE 6.68* 6.67* 6.74*  CALCIUM 8.2* 8.4* 8.4*  PHOS 6.5* 7.6* 7.9*   Liver Function Tests: Recent Labs  Lab 03/02/22 2153  AST 24  ALT 18  ALKPHOS 52  BILITOT 0.3  PROT 7.1  ALBUMIN 3.2*   Recent Labs  Lab 03/02/22 2153  LIPASE 26  No results for input(s): "AMMONIA" in the last 168 hours. CBC: Recent Labs  Lab 03/02/22 2153 03/03/22 0133 03/04/22 0220 03/04/22 1116 03/05/22 0416 03/06/22 0313 03/07/22 0043  WBC 11.2*  --  12.7*  --  15.5* 13.3* 12.3*  NEUTROABS 9.8*  --   --   --   --   --   --   HGB 10.0*   < > 8.3*   < > 8.0* 8.5* 9.6*  HCT 29.2*   < > 25.0*   < > 24.3* 25.6* 28.4*  MCV 98.0  --  101.6*  --  101.7* 102.8* 101.1*  PLT 323  --  360  --  330 318 352   < > = values in this interval not displayed.   Cardiac Enzymes: No results for input(s): "CKTOTAL", "CKMB", "CKMBINDEX", "TROPONINI" in the last 168 hours. CBG: Recent Labs  Lab 03/07/22 1103 03/07/22 1557 03/07/22 2011 03/08/22 0006 03/08/22 0403  GLUCAP 113* 136* 211* 129* 103*    Iron Studies: No results for input(s): "IRON", "TIBC", "TRANSFERRIN", "FERRITIN" in the last 72 hours. Studies/Results: DG CHEST PORT 1 VIEW  Result Date: 03/07/2022 CLINICAL DATA:  Acute respiratory failure EXAM: PORTABLE CHEST 1 VIEW COMPARISON:   03/06/2022 FINDINGS: Stable perihilar airspace opacities. Borderline enlargement of the cardiopericardial silhouette. Minimal blunting of the left lateral costophrenic angle. Right lateral seventh rib deformity compatible with age-indeterminate fracture. Atherosclerotic calcification of the aortic arch. IMPRESSION: 1. Stable perihilar airspace opacities favoring pulmonary edema over pneumonia. 2. Borderline enlargement of the cardiopericardial silhouette. 3. Right lateral seventh rib deformity compatible with age-indeterminate fracture. 4. Minimal blunting of the left lateral costophrenic angle, possibly from a trace left pleural effusion or pleural thickening. Electronically Signed   By: Van Clines M.D.   On: 03/07/2022 09:34   DG CHEST PORT 1 VIEW  Result Date: 03/06/2022 CLINICAL DATA:  Shortness of breath. EXAM: PORTABLE CHEST 1 VIEW COMPARISON:  03/04/2022 FINDINGS: Interval removal of left IJ catheter, endotracheal tube catheter endotracheal tube, and enteric tube. Stable cardiac enlargement. Multifocal airspace disease throughout the right lung is unchanged from previous exam. New left upper lobe airspace opacity. Partial improved aeration to the left lung base. Small pleural effusions suspected. IMPRESSION: 1. Interval removal of support apparatus. 2. New left upper lobe airspace opacity. 3. Improved aeration to the left lung base. Electronically Signed   By: Kerby Moors M.D.   On: 03/06/2022 18:13   Medications: Infusions:  sodium chloride Stopped (03/04/22 1031)   cefTRIAXone (ROCEPHIN)  IV 2 g (03/07/22 2153)   dexmedetomidine (PRECEDEX) IV infusion 0.8 mcg/kg/hr (03/08/22 0441)   dialysis solution 1.5% low-MG/low-CA     dialysis solution 2.5% low-MG/low-CA      Scheduled Medications:  aspirin  81 mg Oral Daily   atorvastatin  20 mg Oral Daily   Chlorhexidine Gluconate Cloth  6 each Topical Daily   darbepoetin (ARANESP) injection - NON-DIALYSIS  200 mcg Subcutaneous Q Mon-1800    furosemide  80 mg Intravenous Q12H   gentamicin cream  1 Application Topical Daily   heparin injection (subcutaneous)  5,000 Units Subcutaneous Q8H   insulin aspart  1-3 Units Subcutaneous Q4H   insulin detemir  5 Units Subcutaneous Q12H   methylPREDNISolone (SOLU-MEDROL) injection  40 mg Intravenous Daily   pantoprazole  40 mg Oral Daily   QUEtiapine  25 mg Oral QHS   sevelamer carbonate  800 mg Oral TID WC   sodium chloride flush  10-40 mL Intracatheter Q12H  have reviewed scheduled and prn medications.  Physical Exam:    General adult male in bed in no acute distress HEENT normocephalic atraumatic extraocular movements intact sclera anicteric Neck supple trachea midline Lungs crackles bilaterally unlabored at rest on 8 liters  Heart S1S2 no rub Abdomen soft nontender distended; PD catheter in place and dressed Extremities no edema appreciated; no cyanosis Psych no current agitation - on precedex Neuro - precedex gtt running - does not wake with exam   Claudia Desanctis, MD 03/08/2022,6:23 AM  LOS: 5 days

## 2022-03-08 NOTE — TOC Progression Note (Signed)
Transition of Care [facility]) - Progression Note    Patient Details  Name: Merel Santoli MRN: [id number] Date of Birth: Feb 16, 1941  Transition of [facility] CM/SW Contact  Tom-Johnson, Renea Ee, RN Phone Number: 03/08/2022, 4:06 PM  Clinical Narrative:     Home health PT recommended, CM reached out to patient's daughter, [name] and she states patient not doing too well at  and they are not in the mindset to discuss disposition at this time. CM will reach out to patient and daughter when patient is more stable.  CM continue to follow as patient progresses with care towards discharge.       Expected Discharge Plan: Home/Self Care Barriers to Discharge: Continued Medical Work up  Expected Discharge Plan and Services   Discharge Planning Services: CM Consult Post Acute Care Choice: NA Living arrangements for the past 2 months: [place]: NA [place] Agency: NA         Social Determinants of Health (SDOH) Interventions SDOH Screenings   Tobacco Use: Medium Risk (03/03/2022)    Readmission Risk Interventions     No data to display

## 2022-03-08 NOTE — Progress Notes (Addendum)
NAME:  William [name], MRN:  [id number], DOB:  1941/01/04, LOS: 5 ADMISSION DATE:  03/02/2022, CONSULTATION DATE:  03/03/22 REFERRING MD:  Loleta Books, CHIEF COMPLAINT:  SOB   History of Present Illness:   82 year old man with hx of CHF, ESRD on PD, HTN, HLD presenting with cough, DOE, intermittent sputum production.  Denies sick contacts.  +subjective fevers.  CXR showing RUL PNA.  O2 needs have escalated rapidly and CXR deteriorated.  PCCM consulted.   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  Interim History / Subjective:   Continues to be agitated overnight and required Precedex drip. Elevated troponins noted  Objective   Blood pressure 91/65, pulse (!) 57, temperature 97.6 F (36.4 C), temperature source Oral, resp. rate 19, height 6' (1.829 m), weight 84.2 kg, SpO2 96 %.    FiO2 (%):  [8 %] 8 %   Intake/Output Summary (Last 24 hours) at 03/08/2022 0810 Last data filed at 03/08/2022 0657 Gross per 24 hour  Intake -409.37 ml  Output 1033 ml  Net -1442.37 ml   Filed Weights   03/07/22 0400 03/07/22 1728 03/08/22 0657  Weight: 85.6 kg 83.9 kg 84.2 kg   Examination: Gen:      No acute distress HEENT:  EOMI, sclera anicteric Neck:     No masses; no thyromegaly Lungs:    Exp wheeze CV:         Regular rate and rhythm; no murmurs Abd:     Distended, PD cath Ext:    No edema; adequate peripheral perfusion Skin:      Warm and dry; no rash Neuro: Awake, Does not answer questions  Labs/imaging reviewed BUN/creatinine 71/6.51, troponin 2428 Hemoglobin 7.9, platelets 273 No new imaging  Assessment & Plan:  Acute hypoxemic respiratory failure  secondary to right-sided community-acquired pneumonia  Wean down oxygen as tolerated Continue IV ceftriaxone.  Finished a course of azithromycin and vancomycin Cultures have been negative so far, including viral panel is negative Add duonebs due to wheeze  Agitated delirium Has been on and off Precedex drip at night.  Suspect sundowning, ICU delirium Increase nighttime Seroquel dose.  Haldol as needed.  Monitor QTc Add melatonin at night  ESRD on PD Continue peritoneal dialysis per nephrology Attempting volume removal  HTN Holding Hypertension medications Continue lasix  Anemia Drop in hemoglobin noted.  There is no active bleed Continue to monitor CBC. Transfuse 1 unit PRBC as he has ischemic changes with elevated troponin  Acute biventricular HFrEF NSTEMI Appreciate cardiology input.  Will review increase in troponin with them No plan for cardiac intervention at this time  Best Practice (right click and "Reselect all SmartList Selections" daily)   Diet/type: Regular diet DVT prophylaxis: prophylactic heparin  GI prophylaxis: Pantoprazole Lines: NA Foley: NA Code Status:  DNR Last date of multidisciplinary goals of care discussion 1/2: [family updated at the bedside.]  Critical care time:    The patient is critically ill with multiple organ system failure and requires high complexity decision making for assessment and support, frequent evaluation and titration of therapies, advanced monitoring, review of radiographic studies and interpretation of complex data.   Critical Care Time devoted to  patient care services, exclusive of separately billable procedures, described in this note is 35 minutes.   Marshell Garfinkel MD [place] [facility] See Amion for pager  If no response to pager , please call [phone] until 7pm After 7:00 pm call Elink  [phone] 03/08/2022, 8:10 AM

## 2022-03-08 NOTE — Progress Notes (Signed)
[place] Progress Note Patient Name: William [name] DOB: December 26, 1940 MRN: [id number]   Date of Service  03/08/2022  HPI/Events of Note  Nausea - Zofran not effective for full 6 hours.   eICU Interventions  Plan: D/C Zofran. Compazine 10 mg IV Q 4 hours PRN N/V.     Intervention Category Major Interventions: Other:  Lysle Dingwall 03/08/2022, 7:42 PM

## 2022-03-09 DIAGNOSIS — N186 End stage renal disease: Secondary | ICD-10-CM | POA: Diagnosis not present

## 2022-03-09 DIAGNOSIS — J9601 Acute respiratory failure with hypoxia: Secondary | ICD-10-CM | POA: Diagnosis not present

## 2022-03-09 DIAGNOSIS — J189 Pneumonia, unspecified organism: Secondary | ICD-10-CM | POA: Diagnosis not present

## 2022-03-09 DIAGNOSIS — R9431 Abnormal electrocardiogram [ECG] [EKG]: Secondary | ICD-10-CM | POA: Diagnosis not present

## 2022-03-09 LAB — BASIC METABOLIC PANEL
Anion gap: 17 — ABNORMAL HIGH (ref 5–15)
BUN: 73 mg/dL — ABNORMAL HIGH (ref 8–23)
CO2: 21 mmol/L — ABNORMAL LOW (ref 22–32)
Calcium: 8.5 mg/dL — ABNORMAL LOW (ref 8.9–10.3)
Chloride: 102 mmol/L (ref 98–111)
Creatinine, Ser: 6.7 mg/dL — ABNORMAL HIGH (ref 0.61–1.24)
GFR, Estimated: 8 mL/min — ABNORMAL LOW (ref >60)
Glucose, Bld: 112 mg/dL — ABNORMAL HIGH (ref 70–99)
Potassium: 3.5 mmol/L (ref 3.5–5.1)
Sodium: 140 mmol/L (ref 135–145)

## 2022-03-09 LAB — TYPE AND SCREEN
ABO/RH(D): A POS
Antibody Screen: NEGATIVE
Unit division: 0

## 2022-03-09 LAB — CBC
HCT: 29.5 % — ABNORMAL LOW (ref 39.0–52.0)
Hemoglobin: 9.6 g/dL — ABNORMAL LOW (ref 13.0–17.0)
MCH: 32.9 pg (ref 26.0–34.0)
MCHC: 32.5 g/dL (ref 30.0–36.0)
MCV: 101 fL — ABNORMAL HIGH (ref 80.0–100.0)
Platelets: 303 10*3/uL (ref 150–400)
RBC: 2.92 MIL/uL — ABNORMAL LOW (ref 4.22–5.81)
RDW: 13.6 % (ref 11.5–15.5)
WBC: 13.2 10*3/uL — ABNORMAL HIGH (ref 4.0–10.5)
nRBC: 1.8 % — ABNORMAL HIGH (ref 0.0–0.2)

## 2022-03-09 LAB — BPAM RBC
Blood Product Expiration Date: 202401262359
ISSUE DATE / TIME: 202401041358
Unit Type and Rh: 6200

## 2022-03-09 LAB — GLUCOSE, CAPILLARY
Glucose-Capillary: 94 mg/dL (ref 70–99)
Glucose-Capillary: 111 mg/dL — ABNORMAL HIGH (ref 70–99)
Glucose-Capillary: 122 mg/dL — ABNORMAL HIGH (ref 70–99)
Glucose-Capillary: 184 mg/dL — ABNORMAL HIGH (ref 70–99)
Glucose-Capillary: 181 mg/dL — ABNORMAL HIGH (ref 70–99)
Glucose-Capillary: 217 mg/dL — ABNORMAL HIGH (ref 70–99)

## 2022-03-09 MED ORDER — DELFLEX-LC/4.25% DEXTROSE 483 MOSM/L IP SOLN: INTRAPERITONEAL | Status: DC

## 2022-03-09 MED ORDER — CARVEDILOL 3.125 MG PO TABS
3.1250 mg | ORAL_TABLET | Freq: Two times a day (BID) | ORAL | Status: DC
  Administered 2022-03-09 – 2022-03-15 (×10): 3.125 mg via ORAL
  Filled 2022-03-09 (×12): qty 1

## 2022-03-09 MED ORDER — POLYETHYLENE GLYCOL 3350 17 G PO PACK
17.0000 g | PACK | Freq: Once | ORAL | Status: AC
  Administered 2022-03-09: 17 g via ORAL
  Filled 2022-03-09: qty 1

## 2022-03-09 MED ORDER — FUROSEMIDE 10 MG/ML IJ SOLN
80.0000 mg | Freq: Once | INTRAMUSCULAR | Status: AC
  Administered 2022-03-09: 80 mg via INTRAVENOUS
  Filled 2022-03-09: qty 8

## 2022-03-09 MED ORDER — DELFLEX-LC/2.5% DEXTROSE 394 MOSM/L IP SOLN: INTRAPERITONEAL | Status: DC

## 2022-03-09 NOTE — Progress Notes (Signed)
Physical Therapy Treatment Patient Details Name: William [name] MRN: [id number] DOB: 26-Aug-1940 Today's Date: 03/09/2022   History of Present Illness 82 y.o. male adm 03/02/2022 with cough, DOE, RUL PNA, NSTEMI. Intubated 12/30-1/1. Transfer out of ICU 1/2 and then back to ICU 1/3 due to confusion and agitation as well as resp distress.  PMHx: CHF, ESRD on PD, HTN, HLD, GERD.    PT Comments    Pt pleasant and very willing to mobilize. Pt appropriate and following commands with decreased awareness of deficits needing min assist for gait. Pt educated for repeated sit to stands and seated HEP. Pt encouraged to walk daily with staff with RW to increase function. Pt tolerating all mobility on 3L with SPO2 92-97% and HR 108-125    Recommendations for follow up therapy are one component of a multi-disciplinary discharge planning process, led by the attending physician.  Recommendations may be updated based on patient status, additional functional criteria and insurance authorization.  Follow Up Recommendations  Home health PT     Assistance Recommended at Discharge Frequent or constant Supervision/Assistance  Patient can return home with the following A little help with walking and/or transfers;A little help with bathing/dressing/bathroom;Assistance with cooking/housework;Assist for transportation;Help with stairs or ramp for entrance   Equipment Recommendations  None recommended by PT    Recommendations for Other Services       Precautions / Restrictions Precautions Precautions: Fall     Mobility  Bed Mobility               General bed mobility comments: in chair on arrival and end of session    Transfers Overall transfer level: Needs assistance   Transfers: Sit to/from Stand Sit to Stand: Min guard           General transfer comment: minguard to rise from chair with cues for hand placement and safety with assist for lines. Pt able to perform 5 repeated STS after  gait    Ambulation/Gait Ambulation/Gait assistance: Min assist Gait Distance (Feet): 150 Feet Assistive device: None Gait Pattern/deviations: Step-through pattern, Decreased stride length, Scissoring   Gait velocity interpretation: 1.31 - 2.62 ft/sec, indicative of limited community ambulator   General Gait Details: pt scissoring x 3 and partial LOB x 5 during gait with min assist to correct and maintain standing. Pt denied use of RW but educated for need to use with staff to progress mobility safely   Stairs             Wheelchair Mobility    Modified Rankin (Stroke Patients Only)       Balance Overall balance assessment: Needs assistance   Sitting balance-Leahy Scale: Good       Standing balance-Leahy Scale: Fair Standing balance comment: static standing fair, min assist for gait                            Cognition Arousal/Alertness: Awake/alert Behavior During Therapy: WFL for tasks assessed/performed Overall Cognitive Status: Within Functional Limits for tasks assessed                                          Exercises General Exercises - Lower Extremity Long Arc Quad: AROM, Both, Seated, 20 reps Hip Flexion/Marching: AROM, Both, Seated, 20 reps    General Comments        Pertinent Vitals/Pain Pain  Assessment Pain Assessment: No/denies pain    Home Living                          Prior Function            PT Goals (current goals can now be found in the care plan section) Progress towards PT goals: Progressing toward goals    Frequency    Min 3X/week      PT Plan Current plan remains appropriate    Co-evaluation              AM-PAC PT "6 Clicks" Mobility   Outcome Measure  Help needed turning from your back to your side while in a flat bed without using bedrails?: None Help needed moving from lying on your back to sitting on the side of a flat bed without using bedrails?: A  Little Help needed moving to and from a bed to a chair (including a wheelchair)?: A Little Help needed standing up from a chair using your arms (e.g., wheelchair or bedside chair)?: A Little Help needed to walk in hospital room?: A Little Help needed climbing 3-5 steps with a railing? : A Lot 6 Click Score: 18    End of Session Equipment Utilized During Treatment: Gait belt Activity Tolerance: Patient tolerated treatment well Patient left: in chair;with call bell/phone within reach;with chair alarm set;with family/visitor present Nurse Communication: Mobility status PT Visit Diagnosis: Unsteadiness on feet (R26.81);Difficulty in walking, not elsewhere classified (R26.2)     Time: 9233-0076 PT Time Calculation (min) (ACUTE ONLY): 21 min  Charges:  $Gait Training: 8-22 mins                     Bayard Males, PT Acute Rehabilitation Services Office: [place] 03/09/2022, 1:02 PM

## 2022-03-09 NOTE — Progress Notes (Addendum)
Nephrology Progress Note:  Subjective:   He remains in the ICU.  Still on PD and no preview UF available.  He also had 700 mL UF over 1/4. He had four stools charted over 1/4.  Spoke with son at bedside.  Pt normally manages his own care and is still working.  His precedex is off.  Patient was able to tell nursing overnight that he has had a lot of alarms at home recently and concern for not always draining per family.  Per nursing, he's been at 10 liters of oxygen since yesterday am and not yet attempted to titrate per nursing.   PD UF:  1/4 am - 183 mL UF per machine (2.5%) 1/3 am - gain of 536 mL (reflects 1.5% bags) He had 523 mL UF charted for 1/2 am with PD (reflecting PD over 1/1-1/2 evening).   Review of systems:   Limited by some confusion He denies overt shortness of breath  Denies chest pain  Denies n/v  Objective Vital signs in last 24 hours: Vitals:   03/09/22 0330 03/09/22 0354 03/09/22 0400 03/09/22 0430  BP: 133/84  124/75   Pulse: (!) 104  76 73  Resp: (!) '21  19 19  '$ Temp:  98.6 F (37 C)    TempSrc:  Oral    SpO2: 94%  94% 100%  Weight:      Height:       Weight change:   Intake/Output Summary (Last 24 hours) at 03/09/2022 0553 Last data filed at 03/08/2022 2000 Gross per 24 hour  Intake 708 ml  Output 883 ml  Net -175 ml    OP PD: f/b Dr Smitty Cords from [facility]. 4 exchanges overnight, 2500 cc dwell vol, uses mostly 1.5% and 2.5% bags.  On PD for 3 years. Dry wt 181- 185 per the pt.    Assessment/ Plan: Pt is a 82 y.o. yo male with ESRD on PD who was admitted on 03/02/2022 with PNA but also felt to have NSTEMI  Assessment/Plan: 1. Right sided community acquired PNA-  extubated.  On rocephin/steroids per CCM. S/p azithro  2. ESRD - on PD at home.   - Continue PD here - increase fluid removal goal.  Tube in position on recent assessment  - Poor UF recently - for now with oxygen requirement mix of 4.25 and 2.5 % dextrose bags tonight.  We have increased  to 5 cycles   - await AM labs - Miralax once today and daily PRN - Lasix 80 mg IV once again  - ultimately if not able to improve UF with PD may need to transition to HD  3. Acute hypoxemic respiratory failure  - secondary to PNA; also rule out any component of overload by optimizing volume removal with PD  4. Anemia of CKD -  he is ordered aranesp 200 mcg weekly on Mondays and got the last dose.  Hb improving.  Follow trends and would reduce next dose if able   5. Secondary hyperparathyroidism-  hyperphosphatemia however he states he has never been on a binder and that phos is usually controlled.  Have started renvela for now  6. HTN -  controlled  7. NSTEMI- per cardiology and primary team; bradycardia resolved  Disposition - in the ICU; per primary team    Labs: Basic Metabolic Panel: Recent Labs  Lab 03/07/22 0043 03/08/22 0553 03/08/22 1520  NA 138 135 135  K 3.6 3.5 3.7  CL 98 98 96*  CO2 21*  22 21*  GLUCOSE 164* 111* 129*  BUN 73* 71* 76*  CREATININE 6.74* 6.51* 6.89*  CALCIUM 8.4* 7.9* 8.3*  PHOS 7.9* 7.1* 6.9*   Liver Function Tests: Recent Labs  Lab 03/02/22 2153 03/08/22 1520  AST 24 32  ALT 18 31  ALKPHOS 52 49  BILITOT 0.3 0.6  PROT 7.1 6.1*  ALBUMIN 3.2* 2.8*   Recent Labs  Lab 03/02/22 2153  LIPASE 26   No results for input(s): "AMMONIA" in the last 168 hours. CBC: Recent Labs  Lab 03/02/22 2153 03/03/22 0133 03/05/22 0416 03/06/22 0313 03/07/22 0043 03/08/22 0553 03/08/22 1520  WBC 11.2*   < > 15.5* 13.3* 12.3* 10.3 14.5*  NEUTROABS 9.8*  --   --   --   --   --   --   HGB 10.0*   < > 8.0* 8.5* 9.6* 7.9* 9.8*  HCT 29.2*   < > 24.3* 25.6* 28.4* 22.9* 29.0*  MCV 98.0   < > 101.7* 102.8* 101.1* 100.0 100.3*  PLT 323   < > 330 318 352 273 346   < > = values in this interval not displayed.   Cardiac Enzymes: No results for input(s): "CKTOTAL", "CKMB", "CKMBINDEX", "TROPONINI" in the last 168 hours. CBG: Recent Labs  Lab  03/08/22 1119 03/08/22 1530 03/08/22 2006 03/08/22 2317 03/09/22 0353  GLUCAP 125* 108* 186* 162* 94    Iron Studies: No results for input(s): "IRON", "TIBC", "TRANSFERRIN", "FERRITIN" in the last 72 hours. Studies/Results: CT CHEST ABDOMEN PELVIS WO CONTRAST  Result Date: 03/08/2022 CLINICAL DATA:  82 year old with shortness of breath and abdominal pain. End-stage renal disease on peritoneal dialysis. EXAM: CT CHEST, ABDOMEN AND PELVIS WITHOUT CONTRAST TECHNIQUE: Multidetector CT imaging of the chest, abdomen and pelvis was performed following the standard protocol without IV contrast. RADIATION DOSE REDUCTION: This exam was performed according to the departmental dose-optimization program which includes automated exposure control, adjustment of the mA and/or kV according to patient size and/or use of iterative reconstruction technique. COMPARISON:  Chest radiograph 03/08/2022 and CT chest abdomen and pelvis 01/21/2019 FINDINGS: CT CHEST FINDINGS Cardiovascular: Extensive coronary artery calcifications. Heart size is slightly enlarged. No significant pericardial effusion. Normal caliber of the thoracic aorta with atherosclerotic calcifications. Mediastinum/Nodes: Prominent mediastinal lymph nodes. Right paratracheal lymph node on sequence 3 image 19 measures up to 1.5 cm in the short axis and previously measured 0.7 cm. Limited evaluation for hilar lymph node enlargement due to the lack of intravascular contrast. No axillary lymph node enlargement. Lungs/Pleura: Small left pleural effusion and moderate sized right pleural effusion. Patchy confluent airspace densities in both lungs, most prominent in the upper lobes. Findings are suggestive for multifocal pneumonia. Musculoskeletal: No acute bone abnormality. CT ABDOMEN PELVIS FINDINGS Hepatobiliary: Small amount of perihepatic ascites. Cholecystectomy. No gross abnormality to the liver. Pancreas: Unremarkable. No pancreatic ductal dilatation or  surrounding inflammatory changes. Spleen: Limited evaluation due to motion artifact. Small amount of perisplenic ascites. Adrenals/Urinary Tract: Normal appearance of the adrenal glands. Both kidneys are atrophic without hydronephrosis. Normal appearance of the urinary bladder containing fluid. Small renal lesions likely represent cysts and do not require dedicated follow-up. Stomach/Bowel: Diverticula involving the duodenum near the ampulla. No evidence for bowel dilatation or obstruction. Colonic diverticula without acute bowel inflammation. Vascular/Lymphatic: Diffuse atherosclerotic calcifications in the abdominal aorta and iliac arteries. Negative for abdominal aortic aneurysm. No lymph node enlargement in the abdomen or pelvis. Reproductive: Prostate is prominent for size. Normal appearance of seminal vesicles. Other: Peritoneal dialysis  catheter is coiled in the right anterior pelvis. Small amount of ascites or free fluid in the pelvis. Negative for free air. Again noted is a lobulated soft tissue structure containing fat in the right anterior abdomen measuring up to 7.5 cm on image 73/3. This is a chronic finding and probably represent prior inflammation or infarction in the omentum. Left inguinal hernia containing fat. Musculoskeletal: No acute bone abnormality. IMPRESSION: 1. Patchy airspace disease in both lungs is suggestive for multifocal pneumonia. 2. Bilateral pleural effusions, right side greater than left. 3. Small amount of ascites associated with the peritoneal dialysis catheter. 4. Prominent mediastinal lymph nodes are likely reactive but nonspecific. 5. Aortic Atherosclerosis (ICD10-I70.0). 6. 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 Electronically Signed   By: Markus Daft M.D.   On: 03/08/2022 17:06   DG Abd Portable 1V  Result Date: 03/08/2022 CLINICAL DATA:  Dyspnea EXAM: PORTABLE ABDOMEN - 1 VIEW COMPARISON:  03/04/2022 FINDINGS:  Bowel gas pattern is nonspecific. Peritoneal dialysis catheter is seen with its tip in the right side of pelvis. Arterial calcifications are seen, especially prominent in tortuous splenic artery. Surgical clips are seen in right upper quadrant. IMPRESSION: Nonspecific bowel gas pattern. Electronically Signed   By: Elmer Picker M.D.   On: 03/08/2022 16:02   DG CHEST PORT 1 VIEW  Result Date: 03/08/2022 CLINICAL DATA:  Dyspnea EXAM: PORTABLE CHEST 1 VIEW COMPARISON:  Previous studies including the examination of 03/07/2022 FINDINGS: Transverse diameter of heart is increased. There is interval worsening of pulmonary vascular congestion. Increased interstitial and alveolar markings are seen in parahilar regions and lower lung fields. Lateral CP angles are clear. There is no pneumothorax. IMPRESSION: Cardiomegaly. Central pulmonary vessels are prominent suggesting CHF. Possibility of underlying pneumonia is not excluded. Electronically Signed   By: Elmer Picker M.D.   On: 03/08/2022 16:01   DG CHEST PORT 1 VIEW  Result Date: 03/07/2022 CLINICAL DATA:  Acute respiratory failure EXAM: PORTABLE CHEST 1 VIEW COMPARISON:  03/06/2022 FINDINGS: Stable perihilar airspace opacities. Borderline enlargement of the cardiopericardial silhouette. Minimal blunting of the left lateral costophrenic angle. Right lateral seventh rib deformity compatible with age-indeterminate fracture. Atherosclerotic calcification of the aortic arch. IMPRESSION: 1. Stable perihilar airspace opacities favoring pulmonary edema over pneumonia. 2. Borderline enlargement of the cardiopericardial silhouette. 3. Right lateral seventh rib deformity compatible with age-indeterminate fracture. 4. Minimal blunting of the left lateral costophrenic angle, possibly from a trace left pleural effusion or pleural thickening. Electronically Signed   By: Van Clines M.D.   On: 03/07/2022 09:34   Medications: Infusions:  sodium chloride Stopped  (03/04/22 1031)   cefTRIAXone (ROCEPHIN)  IV 2 g (03/08/22 2107)   dexmedetomidine (PRECEDEX) IV infusion 0.8 mcg/kg/hr (03/08/22 0441)   dialysis solution 1.5% low-MG/low-CA     dialysis solution 2.5% low-MG/low-CA      Scheduled Medications:  sodium chloride   Intravenous Once   aspirin  81 mg Oral Daily   atorvastatin  20 mg Oral Daily   Chlorhexidine Gluconate Cloth  6 each Topical Daily   darbepoetin (ARANESP) injection - NON-DIALYSIS  200 mcg Subcutaneous Q Mon-1800   gentamicin cream  1 Application Topical Daily   heparin injection (subcutaneous)  5,000 Units Subcutaneous Q8H   insulin aspart  1-3 Units Subcutaneous Q4H   insulin detemir  5 Units Subcutaneous Q12H   melatonin  3 mg Oral QHS   methylPREDNISolone (SOLU-MEDROL) injection  40 mg Intravenous Daily  pantoprazole  40 mg Oral Daily   polyethylene glycol  17 g Oral Once   QUEtiapine  50 mg Oral QHS   sevelamer carbonate  800 mg Oral TID WC   sodium chloride flush  10-40 mL Intracatheter Q12H    have reviewed scheduled and prn medications.  Physical Exam:    General adult male in bed in no acute distress HEENT normocephalic atraumatic extraocular movements intact sclera anicteric Neck supple trachea midline Lungs crackles bilaterally occ wheeze unlabored at rest on 10 ml - sats 100% on arrival  Heart S1S2 no rub Abdomen soft nontender distended; PD catheter in place and dressed Extremities no edema appreciated; no cyanosis Psych no current agitation  Neuro - no continuous sedation; oriented to person, year of 2024 but states we are at his house   Claudia Desanctis, MD 03/09/2022,6:15 AM  LOS: 6 days      Spoke with dialysis RN.  UF not documented when he was taken off this AM.  She states he had 2075 mL UF - much improved.  She will document this as well.  Continue plans above.  On 6 liters oxygen  Zachery Dauer 11:34 AM 03/09/2022

## 2022-03-09 NOTE — Progress Notes (Signed)
NAME:  William [name], MRN:  [id number], DOB:  26-Jul-1940, LOS: 6 ADMISSION DATE:  03/02/2022, CONSULTATION DATE:  03/03/22 REFERRING MD:  Loleta Books, CHIEF COMPLAINT:  SOB   History of Present Illness:   82 year old man with hx of CHF, ESRD on PD, HTN, HLD presenting with cough, DOE, intermittent sputum production.  Denies sick contacts.  +subjective fevers.  CXR showing RUL PNA.  O2 needs have escalated rapidly and CXR deteriorated.  PCCM consulted.   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 1/4 EKG with increased ST/T changes inferolateral, elevated troponin, hemoglobin dropped from 9.6-7.9, cardiology consultation.  CT chest/abdomen/pelvis showed patchy airspace disease in both lungs, bilateral effusions right more than left  Interim History / Subjective:  Complain of abdominal pain and nausea overnight-now resolved CT chest/abdomen/pelvis unremarkable Off Precedex drip for 24 hours  Objective   Blood pressure 128/80, pulse 95, temperature 98.6 F (37 C), temperature source Oral, resp. rate (!) 22, height 6' (1.829 m), weight 83.6 kg, SpO2 99 %.        Intake/Output Summary (Last 24 hours) at 03/09/2022 0815 Last data filed at 03/08/2022 2000 Gross per 24 hour  Intake 708 ml  Output 700 ml  Net 8 ml    Filed Weights   03/07/22 1728 03/08/22 0657 03/09/22 0733  Weight: 83.9 kg 84.2 kg 83.6 kg   Examination: Gen:      No acute distress HEENT:  EOMI, sclera anicteric Neck:     No masses; no thyromegaly Lungs:    Clear breath sounds bilateral, no accessory muscle use CV:         S1-S2 tachycardic Abd:     Distended, PD cath Ext:    No edema;  adequate peripheral perfusion Skin:      Warm and dry; no rash Neuro: Awake, Does not answer questions  Labs/imaging reviewed BUN/creatinine 76/6.9. Troponins decreasing Hemoglobin improved to 9.8 and stable  Assessment & Plan:  Acute hypoxemic respiratory failure secondary to right-sided community-acquired pneumonia  Wean down oxygen as tolerated Continue IV ceftriaxone.  Finished a course of azithromycin and vancomycin Cultures have been negative so far, including viral panel is negative Add duonebs due to wheeze Solu-Medrol 40 mg daily x 5 doses  Agitated delirium Has been on and off Precedex drip at night.  Suspect sundowning, ICU delirium Stay at Seroquel 50 Haldol as needed.  Monitor QTc Add melatonin at night  Acute biventricular HFrEF NSTEMI Appreciate cardiology input.  No plan for cardiac intervention at this time Add Coreg 3.125 twice daily  ESRD on PD Continue peritoneal dialysis per nephrology Attempting volume removal  HTN Holding Hypertension medications Continue lasix  Anemia There is no active bleed.  1 U PRBC for hemoglobin 7.9 in the setting of elevated troponin on 1/4 Continue to monitor CBC. Goal hemoglobin 8 and above  Can transfer to telemetry if continues to improve Start PT  Best Practice (right click and "Reselect all SmartList Selections" daily)   Diet/type: Regular diet DVT prophylaxis: prophylactic heparin  GI prophylaxis: Pantoprazole Lines: NA Foley: NA Code Status:  DNR Last date of multidisciplinary goals of care discussion 1/2: [Patient and family updated at the  bedside.]  Critical care time:    The patient is critically ill with multiple organ system failure and requires high complexity decision making for assessment and support, frequent evaluation and titration of therapies, advanced monitoring, review of radiographic studies and interpretation of complex data.   Critical Care Time devoted to patient care services,  exclusive of separately billable procedures, described in this note is 31 minutes.   Leanna Sato Elsworth Soho MD [facility] See Amion for pager  If no response to pager , please call [phone] until 7pm After 7:00 pm call Elink  [phone] 03/09/2022, 8:15 AM

## 2022-03-09 NOTE — Progress Notes (Signed)
Rounding Note    Patient Name: William [name] Date of Encounter: 03/09/2022  [place] Cardiologist: None   Subjective   Transferred to 16M overnight; no chest pain.  Cough has improved  Inpatient Medications    Scheduled Meds:  sodium chloride   Intravenous Once   aspirin  81 mg Oral Daily   atorvastatin  20 mg Oral Daily   carvedilol  3.125 mg Oral BID WC   Chlorhexidine Gluconate Cloth  6 each Topical Daily   darbepoetin (ARANESP) injection - NON-DIALYSIS  200 mcg Subcutaneous Q Mon-1800   gentamicin cream  1 Application Topical Daily   heparin injection (subcutaneous)  5,000 Units Subcutaneous Q8H   insulin aspart  1-3 Units Subcutaneous Q4H   insulin detemir  5 Units Subcutaneous Q12H   melatonin  3 mg Oral QHS   pantoprazole  40 mg Oral Daily   polyethylene glycol  17 g Oral Once   QUEtiapine  50 mg Oral QHS   sevelamer carbonate  800 mg Oral TID WC   sodium chloride flush  10-40 mL Intracatheter Q12H   Continuous Infusions:  sodium chloride Stopped (03/04/22 1031)   cefTRIAXone (ROCEPHIN)  IV 2 g (03/08/22 2107)   dialysis solution 1.5% low-MG/low-CA     dialysis solution 2.5% low-MG/low-CA     dialysis solution 2.5% low-MG/low-CA     dialysis solution 4.25% low-MG/low-CA     PRN Meds: sodium chloride, acetaminophen **OR** acetaminophen, calcium carbonate, haloperidol lactate, ipratropium-albuterol, metoprolol tartrate, mouth rinse, polyethylene glycol, prochlorperazine, sodium chloride flush, white petrolatum   Vital Signs    Vitals:   03/09/22 0930 03/09/22 1000 03/09/22 1030 03/09/22 1100  BP: (!) 138/91 (!) 140/76 113/84 119/83  Pulse: (!) 126 (!) 111 (!) 119 95  Resp: (!) 26 (!) 28 (!) 25 (!) 24  Temp:      TempSrc:      SpO2: 93% 100% 94% 97%  Weight:      Height:        Intake/Output Summary (Last 24 hours) at 03/09/2022 1157 Last data filed at 03/09/2022 0733 Gross per 24 hour  Intake 528 ml  Output 2775 ml  Net -2247 ml       03/09/2022    7:33 AM 03/08/2022    6:57 AM 03/07/2022    5:28 PM  Last 3 Weights  Weight (lbs) 184 lb 4.9 oz 185 lb 10 oz 184 lb 15.5 oz  Weight (kg) 83.6 kg 84.2 kg 83.9 kg      Telemetry    Sinus rhythm at 80 - Personally Reviewed  ECG   03/08/2022 ECG (independently read by me): NSR at 71, slighlty more pronounced STT changes  I, aVL, V3-6  03/07/2022 ECG (independently read by me): Normal sinus rhythm at 77, ST segment changes 1, aVL, V4 through V6.   03/03/23 ECG (independently read by me): NSR inferolater ST changes - Personally Reviewed  Physical Exam   GEN: Much better today.  No shortness of breath.  Improved energy Neck: No JVD Cardiac: Regular heart rate and rhythm in the 80s.  Faint 1/6 systolic murmur.  No audible diastolic murmur.   Respiratory: Slightly decreased breath sounds most prominent at bases GI: Nontender, bowel sounds positive MS: No edema. Neuro:  Nonfocal  Psych: Normal affect   Labs    High Sensitivity Troponin:   Recent Labs  Lab 03/08/22 0553 03/08/22 0756 03/08/22 0917 03/08/22 1520 03/08/22 1850  TROPONINIHS 2,478* 2,695* 2,560* 1,534* 1,415*  Chemistry Recent Labs  Lab 03/02/22 2153 03/03/22 0133 03/07/22 0043 03/08/22 0553 03/08/22 1520 03/09/22 0731  NA 129*   < > 138 135 135 140  K 4.3   < > 3.6 3.5 3.7 3.5  CL 96*   < > 98 98 96* 102  CO2 20*   < > 21* 22 21* 21*  GLUCOSE 120*   < > 164* 111* 129* 112*  BUN 59*   < > 73* 71* 76* 73*  CREATININE 6.08*   < > 6.74* 6.51* 6.89* 6.70*  CALCIUM 8.4*   < > 8.4* 7.9* 8.3* 8.5*  MG  --    < > 2.2 2.2 2.2  --   PROT 7.1  --   --   --  6.1*  --   ALBUMIN 3.2*  --   --   --  2.8*  --   AST 24  --   --   --  32  --   ALT 18  --   --   --  31  --   ALKPHOS 52  --   --   --  49  --   BILITOT 0.3  --   --   --  0.6  --   GFRNONAA 9*   < > 8* 8* 8* 8*  ANIONGAP 13   < > 19* 15 18* 17*   < > = values in this interval not displayed.    Lipids  Recent Labs  Lab 03/07/22 0043   CHOL 168  TRIG 136  HDL 47  LDLCALC 94  CHOLHDL 3.6    Hematology Recent Labs  Lab 03/08/22 0553 03/08/22 1520 03/09/22 0731  WBC 10.3 14.5* 13.2*  RBC 2.29* 2.89* 2.92*  HGB 7.9* 9.8* 9.6*  HCT 22.9* 29.0* 29.5*  MCV 100.0 100.3* 101.0*  MCH 34.5* 33.9 32.9  MCHC 34.5 33.8 32.5  RDW 11.9 12.3 13.6  PLT 273 346 303   Thyroid No results for input(s): "TSH", "FREET4" in the last 168 hours.  BNP Recent Labs  Lab 03/02/22 2153  BNP 1,301.2*    DDimer No results for input(s): "DDIMER" in the last 168 hours.   Lipid Panel     Component Value Date/Time   CHOL 168 03/07/2022 0043   TRIG 136 03/07/2022 0043   HDL 47 03/07/2022 0043   CHOLHDL 3.6 03/07/2022 0043   VLDL 27 03/07/2022 0043   LDLCALC 94 03/07/2022 0043     Radiology    CT CHEST ABDOMEN PELVIS WO CONTRAST  Result Date: 03/08/2022 CLINICAL DATA:  82 year old with shortness of breath and abdominal pain. End-stage renal disease on peritoneal dialysis. EXAM: CT CHEST, ABDOMEN AND PELVIS WITHOUT CONTRAST TECHNIQUE: Multidetector CT imaging of the chest, abdomen and pelvis was performed following the standard protocol without IV contrast. RADIATION DOSE REDUCTION: This exam was performed according to the departmental dose-optimization program which includes automated exposure control, adjustment of the mA and/or kV according to patient size and/or use of iterative reconstruction technique. COMPARISON:  Chest radiograph 03/08/2022 and CT chest abdomen and pelvis 01/21/2019 FINDINGS: CT CHEST FINDINGS Cardiovascular: Extensive coronary artery calcifications. Heart size is slightly enlarged. No significant pericardial effusion. Normal caliber of the thoracic aorta with atherosclerotic calcifications. Mediastinum/Nodes: Prominent mediastinal lymph nodes. Right paratracheal lymph node on sequence 3 image 19 measures up to 1.5 cm in the short axis and previously measured 0.7 cm. Limited evaluation for hilar lymph node  enlargement due to the lack of intravascular contrast. No  axillary lymph node enlargement. Lungs/Pleura: Small left pleural effusion and moderate sized right pleural effusion. Patchy confluent airspace densities in both lungs, most prominent in the upper lobes. Findings are suggestive for multifocal pneumonia. Musculoskeletal: No acute bone abnormality. CT ABDOMEN PELVIS FINDINGS Hepatobiliary: Small amount of perihepatic ascites. Cholecystectomy. No gross abnormality to the liver. Pancreas: Unremarkable. No pancreatic ductal dilatation or surrounding inflammatory changes. Spleen: Limited evaluation due to motion artifact. Small amount of perisplenic ascites. Adrenals/Urinary Tract: Normal appearance of the adrenal glands. Both kidneys are atrophic without hydronephrosis. Normal appearance of the urinary bladder containing fluid. Small renal lesions likely represent cysts and do not require dedicated follow-up. Stomach/Bowel: Diverticula involving the duodenum near the ampulla. No evidence for bowel dilatation or obstruction. Colonic diverticula without acute bowel inflammation. Vascular/Lymphatic: Diffuse atherosclerotic calcifications in the abdominal aorta and iliac arteries. Negative for abdominal aortic aneurysm. No lymph node enlargement in the abdomen or pelvis. Reproductive: Prostate is prominent for size. Normal appearance of seminal vesicles. Other: Peritoneal dialysis catheter is coiled in the right anterior pelvis. Small amount of ascites or free fluid in the pelvis. Negative for free air. Again noted is a lobulated soft tissue structure containing fat in the right anterior abdomen measuring up to 7.5 cm on image 73/3. This is a chronic finding and probably represent prior inflammation or infarction in the omentum. Left inguinal hernia containing fat. Musculoskeletal: No acute bone abnormality. IMPRESSION: 1. Patchy airspace disease in both lungs is suggestive for multifocal pneumonia. 2. Bilateral  pleural effusions, right side greater than left. 3. Small amount of ascites associated with the peritoneal dialysis catheter. 4. Prominent mediastinal lymph nodes are likely reactive but nonspecific. 5. Aortic Atherosclerosis (ICD10-I70.0). 6. 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 Electronically Signed   By: Markus Daft M.D.   On: 03/08/2022 17:06   DG Abd Portable 1V  Result Date: 03/08/2022 CLINICAL DATA:  Dyspnea EXAM: PORTABLE ABDOMEN - 1 VIEW COMPARISON:  03/04/2022 FINDINGS: Bowel gas pattern is nonspecific. Peritoneal dialysis catheter is seen with its tip in the right side of pelvis. Arterial calcifications are seen, especially prominent in tortuous splenic artery. Surgical clips are seen in right upper quadrant. IMPRESSION: Nonspecific bowel gas pattern. Electronically Signed   By: Elmer Picker M.D.   On: 03/08/2022 16:02   DG CHEST PORT 1 VIEW  Result Date: 03/08/2022 CLINICAL DATA:  Dyspnea EXAM: PORTABLE CHEST 1 VIEW COMPARISON:  Previous studies including the examination of 03/07/2022 FINDINGS: Transverse diameter of heart is increased. There is interval worsening of pulmonary vascular congestion. Increased interstitial and alveolar markings are seen in parahilar regions and lower lung fields. Lateral CP angles are clear. There is no pneumothorax. IMPRESSION: Cardiomegaly. Central pulmonary vessels are prominent suggesting CHF. Possibility of underlying pneumonia is not excluded. Electronically Signed   By: Elmer Picker M.D.   On: 03/08/2022 16:01    Cardiac Studies   Echo: 03/2011   Study Conclusions   - Left ventricle: The cavity size was normal. Systolic    function was mildly reduced. The estimated ejection    fraction was in the range of 45% to 50%. Doppler    parameters are consistent with abnormal left ventricular    relaxation (grade 1 diastolic dysfunction).  - Aortic valve: Mild regurgitation.  - Mitral  valve: Mild regurgitation.  - Left atrium: The atrium was moderately dilated.  - Right ventricle: The cavity size was mildly dilated. Wall    thickness  was normal.  - Right atrium: The atrium was mildly dilated.  Echocardiography.  M-mode, complete 2D, spectral Doppler,  and color Doppler.  Height:  Height: 182.9cm. Height: 72in.  Weight:  Weight: 78kg. Weight: 171.6lb.  Body mass index:  BMI: 23.3kg/m^2.  Body surface area:    BSA: 50m2.  Blood  pressure:     163/91.  Patient status:  Outpatient.  [facility]    Echo: 03/04/2022 IMPRESSIONS   1. Left ventricular ejection fraction, by estimation, is 40 to 45%. The  left ventricle has mildly decreased function. The left ventricle  demonstrates global hypokinesis. There is mild left ventricular  hypertrophy. Left ventricular diastolic parameters  are consistent with Grade I diastolic dysfunction (impaired relaxation).   2. Right ventricular systolic function is mildly reduced. The right  ventricular size is normal. Tricuspid regurgitation signal is inadequate  for assessing PA pressure.   3. Moderate pleural effusion.   4. Mild mitral valve regurgitation.   5. Aortic valve regurgitation is mild.   6. The inferior vena cava is normal in size with <50% respiratory  variability, suggesting right atrial pressure of 8 mmHg.   Comparison(s): No prior Echocardiogram.   FINDINGS   Left Ventricle: Left ventricular ejection fraction, by estimation, is 40  to 45%. The left ventricle has mildly decreased function. The left  ventricle demonstrates global hypokinesis. The left ventricular internal  cavity size was normal in size. There is   mild left ventricular hypertrophy. Left ventricular diastolic parameters  are consistent with Grade I diastolic dysfunction (impaired relaxation).   Right Ventricle: The right ventricular size is normal. Right ventricular  systolic function is mildly reduced. Tricuspid regurgitation signal  is  inadequate for assessing PA pressure.   Left Atrium: Left atrial size was normal in size.   Right Atrium: Right atrial size was normal in size.   Pericardium: There is no evidence of pericardial effusion.   Mitral Valve: Mild mitral valve regurgitation.   Tricuspid Valve: Tricuspid valve regurgitation is not demonstrated.   Aortic Valve: Aortic valve regurgitation is mild. Aortic regurgitation PHT  measures 775 msec. Aortic valve mean gradient measures 4.0 mmHg. Aortic  valve peak gradient measures 8.0 mmHg. Aortic valve area, by VTI measures  3.31 cm.   Pulmonic Valve: Pulmonic valve regurgitation is mild.   Aorta: The aortic root and ascending aorta are structurally normal, with  no evidence of dilitation.   Venous: The inferior vena cava is normal in size with less than 50%  respiratory variability, suggesting right atrial pressure of 8 mmHg.   IAS/Shunts: The interatrial septum was not well visualized.   Additional Comments: There is a moderate pleural effusion.      Patient Profile     82y.o. male who has a history of end-stage renal disease on peritoneal dialysis, hypertension, hypothyroidism, hyperlipidemia, and mild mitral regurgitation.  He has been found to have acute hypoxic respiratory failure secondary to significant pneumonia and ruled in for non-STEMI.  Assessment & Plan    Non-STEMI (suspect type II): Prior ECGs with inferolateral ST changes.  Repeat EKG yesterday showed more pronounced ST-T abnormalities inferolaterally, laterally and anterolaterally.  No chest pain or increasing shortness of breath.  Troponins are elevated with a flat plateau consistent with probable demand ischemia.  This may be more abnormal today due to the significant hemoglobin drop 9.6/28.4 to 7.9/22.0.  He received packed red blood cell transfusion.  He feels significantly improved today.  No shortness of  breath.  No chest pain. Acute hypoxic respiratory failure/pneumonia.   Extubated 03/05/2022. he continues to be on vancomycin in addition to Rocephin.  He was transferred out of to heart ICU to floor, but was transferred back to to MICU due to progressive agitation; on Precedex drip Progressive anemia: No active bright red blood.  He underwent successful transfusion yesterday.  H/HCT improved from 7.9/22.9 to 9.6/29.5 No active bright red blood per rectum.  Feels  much better tody after transfusion with transfuse 1 unit packed red blood cell with his elevated troponin and probable global ischemia.  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  Normal RV size with mild reduction in function.  Mild aortic and mitral regurgitation.  Suspect global hypokinesis related to ongoing infectious process. End-stage renal disease on peritoneal dialysis.   Hyperlipidemia: Not on therapy.  LDL 122 in November 2023.  Repeat lipid panel with LDL at 94.  High likelihood for CAD, will initiate low-dose atorvastatin 20 mg daily. Macrocytic anemia: MCV 102.8      For questions or updates, please contact [place] Please consult [email or web address] for contact info under        Signed, Shelva Majestic, MD  03/09/2022, 11:57 AM

## 2022-03-10 DIAGNOSIS — N189 Chronic kidney disease, unspecified: Secondary | ICD-10-CM

## 2022-03-10 DIAGNOSIS — E7849 Other hyperlipidemia: Secondary | ICD-10-CM

## 2022-03-10 DIAGNOSIS — I214 Non-ST elevation (NSTEMI) myocardial infarction: Secondary | ICD-10-CM | POA: Insufficient documentation

## 2022-03-10 DIAGNOSIS — I10 Essential (primary) hypertension: Secondary | ICD-10-CM

## 2022-03-10 DIAGNOSIS — J9601 Acute respiratory failure with hypoxia: Secondary | ICD-10-CM | POA: Diagnosis not present

## 2022-03-10 DIAGNOSIS — D638 Anemia in other chronic diseases classified elsewhere: Secondary | ICD-10-CM | POA: Diagnosis not present

## 2022-03-10 DIAGNOSIS — D631 Anemia in chronic kidney disease: Secondary | ICD-10-CM

## 2022-03-10 DIAGNOSIS — R410 Disorientation, unspecified: Secondary | ICD-10-CM | POA: Insufficient documentation

## 2022-03-10 DIAGNOSIS — I5043 Acute on chronic combined systolic (congestive) and diastolic (congestive) heart failure: Secondary | ICD-10-CM | POA: Insufficient documentation

## 2022-03-10 DIAGNOSIS — E8721 Acute metabolic acidosis: Secondary | ICD-10-CM

## 2022-03-10 DIAGNOSIS — E871 Hypo-osmolality and hyponatremia: Secondary | ICD-10-CM

## 2022-03-10 LAB — CBC WITH DIFFERENTIAL/PLATELET
Abs Immature Granulocytes: 0.45 10*3/uL — ABNORMAL HIGH (ref 0.00–0.07)
Basophils Absolute: 0.1 10*3/uL (ref 0.0–0.1)
Basophils Relative: 0 %
Eosinophils Absolute: 0.1 10*3/uL (ref 0.0–0.5)
Eosinophils Relative: 0 %
HCT: 30.8 % — ABNORMAL LOW (ref 39.0–52.0)
Hemoglobin: 10.5 g/dL — ABNORMAL LOW (ref 13.0–17.0)
Immature Granulocytes: 4 %
Lymphocytes Relative: 4 %
Lymphs Abs: 0.6 10*3/uL — ABNORMAL LOW (ref 0.7–4.0)
MCH: 33.5 pg (ref 26.0–34.0)
MCHC: 34.1 g/dL (ref 30.0–36.0)
MCV: 98.4 fL (ref 80.0–100.0)
Monocytes Absolute: 0.9 10*3/uL (ref 0.1–1.0)
Monocytes Relative: 7 %
Neutro Abs: 10.7 10*3/uL — ABNORMAL HIGH (ref 1.7–7.7)
Neutrophils Relative %: 85 %
Platelets: 340 10*3/uL (ref 150–400)
RBC: 3.13 MIL/uL — ABNORMAL LOW (ref 4.22–5.81)
RDW: 13.3 % (ref 11.5–15.5)
WBC: 12.8 10*3/uL — ABNORMAL HIGH (ref 4.0–10.5)
nRBC: 1.3 % — ABNORMAL HIGH (ref 0.0–0.2)

## 2022-03-10 LAB — GLUCOSE, CAPILLARY
Glucose-Capillary: 104 mg/dL — ABNORMAL HIGH (ref 70–99)
Glucose-Capillary: 132 mg/dL — ABNORMAL HIGH (ref 70–99)
Glucose-Capillary: 142 mg/dL — ABNORMAL HIGH (ref 70–99)
Glucose-Capillary: 157 mg/dL — ABNORMAL HIGH (ref 70–99)
Glucose-Capillary: 199 mg/dL — ABNORMAL HIGH (ref 70–99)

## 2022-03-10 LAB — BASIC METABOLIC PANEL
Anion gap: 15 (ref 5–15)
BUN: 72 mg/dL — ABNORMAL HIGH (ref 8–23)
CO2: 23 mmol/L (ref 22–32)
Calcium: 8.7 mg/dL — ABNORMAL LOW (ref 8.9–10.3)
Chloride: 101 mmol/L (ref 98–111)
Creatinine, Ser: 7.15 mg/dL — ABNORMAL HIGH (ref 0.61–1.24)
GFR, Estimated: 7 mL/min — ABNORMAL LOW (ref >60)
Glucose, Bld: 146 mg/dL — ABNORMAL HIGH (ref 70–99)
Potassium: 3.2 mmol/L — ABNORMAL LOW (ref 3.5–5.1)
Sodium: 139 mmol/L (ref 135–145)

## 2022-03-10 MED ORDER — POLYETHYLENE GLYCOL 3350 17 G PO PACK
17.0000 g | PACK | Freq: Once | ORAL | Status: AC
  Administered 2022-03-10: 17 g via ORAL
  Filled 2022-03-10: qty 1

## 2022-03-10 MED ORDER — SENNOSIDES-DOCUSATE SODIUM 8.6-50 MG PO TABS
1.0000 | ORAL_TABLET | Freq: Once | ORAL | Status: AC
  Administered 2022-03-10: 1 via ORAL
  Filled 2022-03-10: qty 1

## 2022-03-10 MED ORDER — POLYETHYLENE GLYCOL 3350 17 G PO PACK: 17.0000 g | PACK | Freq: Two times a day (BID) | ORAL | Status: DC | PRN

## 2022-03-10 MED ORDER — GUAIFENESIN ER 600 MG PO TB12
600.0000 mg | ORAL_TABLET | Freq: Two times a day (BID) | ORAL | Status: DC
  Administered 2022-03-10 – 2022-03-15 (×10): 600 mg via ORAL
  Filled 2022-03-10 (×11): qty 1

## 2022-03-10 MED ORDER — POTASSIUM CHLORIDE CRYS ER 20 MEQ PO TBCR
40.0000 meq | EXTENDED_RELEASE_TABLET | Freq: Once | ORAL | Status: AC
  Administered 2022-03-10: 40 meq via ORAL
  Filled 2022-03-10: qty 2

## 2022-03-10 MED ORDER — GUAIFENESIN-DM 100-10 MG/5ML PO SYRP
5.0000 mL | ORAL_SOLUTION | ORAL | Status: DC | PRN
  Administered 2022-03-10: 5 mL via ORAL
  Filled 2022-03-10: qty 5

## 2022-03-10 NOTE — Progress Notes (Signed)
Patient had 13 episodes of V-tachs. He was asymptomatic. MD notified. Will continue to monitor.

## 2022-03-10 NOTE — Progress Notes (Signed)
Nephrology Progress Note:  Subjective:    Seen in room. PD completed - 634 mL UF. He feels good. Denies cp/dyspnea.   PD UF:  1/5 -  2075 mL UF 1/4 am - 183 mL UF per machine (2.5%) 1/3 am - gain of 536 mL (reflects 1.5% bags) He had 523 mL UF charted for 1/2 am with PD (reflecting PD over 1/1-1/2 evening).   Review of systems:   Limited by some confusion He denies overt shortness of breath  Denies chest pain  Denies n/v  Objective Vital signs in last 24 hours: Vitals:   03/09/22 1933 03/09/22 2314 03/10/22 0434 03/10/22 0810  BP: 128/87 135/88 127/78 (!) 124/58  Pulse: 85 83 77 82  Resp: '18 17 18   '$ Temp: 97.7 F (36.5 C) 97.8 F (36.6 C) 98.2 F (36.8 C)   TempSrc: Oral Oral Oral   SpO2: 95% 97% 98% 93%  Weight:      Height:       Weight change:   Intake/Output Summary (Last 24 hours) at 03/10/2022 1036 Last data filed at 03/10/2022 1026 Gross per 24 hour  Intake 363 ml  Output 1235 ml  Net -872 ml     OP PD: f/b Dr Smitty Cords from [facility]. 4 exchanges overnight, 2500 cc dwell vol, uses mostly 1.5% and 2.5% bags.  On PD for 3 years. Dry wt 181- 185 per the pt.    Assessment/ Plan: Pt is a 82 y.o. yo male with ESRD on PD who was admitted on 03/02/2022 with PNA but also felt to have NSTEMI  Assessment/Plan: 1. Right sided community acquired PNA-  extubated.  Completed antibiotics   2. ESRD - on PD at home.   - Continue PD here - increase fluid removal goal.  Tube in position on recent assessment  - Continue mix of 4.25 and 2.5 % dextrose bags if here tonight.  We have increased to 5 cycles   - await AM labs - Miralax once today and daily PRN -volume status improved  3.Hypokalemia - Kcl ordered   4. Acute hypoxemic respiratory failure  - secondary to PNA; also rule out any component of overload by optimizing volume removal with PD  5. Anemia of CKD -  he is ordered aranesp 200 mcg weekly on Mondays and got the last dose.  Hb improving.  Follow trends and  would reduce next dose if able   6. Secondary hyperparathyroidism-  hyperphosphatemia however he states he has never been on a binder and that phos is usually controlled.  Have started renvela for now  7. HTN -  controlled  8. NSTEMI- per cardiology and primary team; bradycardia resolved  Disposition -  per primary team    Labs: Basic Metabolic Panel: Recent Labs  Lab 03/07/22 0043 03/08/22 0553 03/08/22 1520 03/09/22 0731 03/10/22 0057  NA 138 135 135 140 139  K 3.6 3.5 3.7 3.5 3.2*  CL 98 98 96* 102 101  CO2 21* 22 21* 21* 23  GLUCOSE 164* 111* 129* 112* 146*  BUN 73* 71* 76* 73* 72*  CREATININE 6.74* 6.51* 6.89* 6.70* 7.15*  CALCIUM 8.4* 7.9* 8.3* 8.5* 8.7*  PHOS 7.9* 7.1* 6.9*  --   --     Liver Function Tests: Recent Labs  Lab 03/08/22 1520  AST 32  ALT 31  ALKPHOS 49  BILITOT 0.6  PROT 6.1*  ALBUMIN 2.8*    No results for input(s): "LIPASE", "AMYLASE" in the last 168 hours.  No  results for input(s): "AMMONIA" in the last 168 hours. CBC: Recent Labs  Lab 03/07/22 0043 03/08/22 0553 03/08/22 1520 03/09/22 0731 03/10/22 0057  WBC 12.3* 10.3 14.5* 13.2* 12.8*  NEUTROABS  --   --   --   --  10.7*  HGB 9.6* 7.9* 9.8* 9.6* 10.5*  HCT 28.4* 22.9* 29.0* 29.5* 30.8*  MCV 101.1* 100.0 100.3* 101.0* 98.4  PLT 352 273 346 303 340    Cardiac Enzymes: No results for input(s): "CKTOTAL", "CKMB", "CKMBINDEX", "TROPONINI" in the last 168 hours. CBG: Recent Labs  Lab 03/09/22 1222 03/09/22 1545 03/09/22 2044 03/09/22 2313 03/10/22 0436  GLUCAP 122* 184* 181* 217* 142*     Iron Studies: No results for input(s): "IRON", "TIBC", "TRANSFERRIN", "FERRITIN" in the last 72 hours. Studies/Results: CT CHEST ABDOMEN PELVIS WO CONTRAST  Result Date: 03/08/2022 CLINICAL DATA:  82 year old with shortness of breath and abdominal pain. End-stage renal disease on peritoneal dialysis. EXAM: CT CHEST, ABDOMEN AND PELVIS WITHOUT CONTRAST TECHNIQUE: Multidetector CT  imaging of the chest, abdomen and pelvis was performed following the standard protocol without IV contrast. RADIATION DOSE REDUCTION: This exam was performed according to the departmental dose-optimization program which includes automated exposure control, adjustment of the mA and/or kV according to patient size and/or use of iterative reconstruction technique. COMPARISON:  Chest radiograph 03/08/2022 and CT chest abdomen and pelvis 01/21/2019 FINDINGS: CT CHEST FINDINGS Cardiovascular: Extensive coronary artery calcifications. Heart size is slightly enlarged. No significant pericardial effusion. Normal caliber of the thoracic aorta with atherosclerotic calcifications. Mediastinum/Nodes: Prominent mediastinal lymph nodes. Right paratracheal lymph node on sequence 3 image 19 measures up to 1.5 cm in the short axis and previously measured 0.7 cm. Limited evaluation for hilar lymph node enlargement due to the lack of intravascular contrast. No axillary lymph node enlargement. Lungs/Pleura: Small left pleural effusion and moderate sized right pleural effusion. Patchy confluent airspace densities in both lungs, most prominent in the upper lobes. Findings are suggestive for multifocal pneumonia. Musculoskeletal: No acute bone abnormality. CT ABDOMEN PELVIS FINDINGS Hepatobiliary: Small amount of perihepatic ascites. Cholecystectomy. No gross abnormality to the liver. Pancreas: Unremarkable. No pancreatic ductal dilatation or surrounding inflammatory changes. Spleen: Limited evaluation due to motion artifact. Small amount of perisplenic ascites. Adrenals/Urinary Tract: Normal appearance of the adrenal glands. Both kidneys are atrophic without hydronephrosis. Normal appearance of the urinary bladder containing fluid. Small renal lesions likely represent cysts and do not require dedicated follow-up. Stomach/Bowel: Diverticula involving the duodenum near the ampulla. No evidence for bowel dilatation or obstruction. Colonic  diverticula without acute bowel inflammation. Vascular/Lymphatic: Diffuse atherosclerotic calcifications in the abdominal aorta and iliac arteries. Negative for abdominal aortic aneurysm. No lymph node enlargement in the abdomen or pelvis. Reproductive: Prostate is prominent for size. Normal appearance of seminal vesicles. Other: Peritoneal dialysis catheter is coiled in the right anterior pelvis. Small amount of ascites or free fluid in the pelvis. Negative for free air. Again noted is a lobulated soft tissue structure containing fat in the right anterior abdomen measuring up to 7.5 cm on image 73/3. This is a chronic finding and probably represent prior inflammation or infarction in the omentum. Left inguinal hernia containing fat. Musculoskeletal: No acute bone abnormality. IMPRESSION: 1. Patchy airspace disease in both lungs is suggestive for multifocal pneumonia. 2. Bilateral pleural effusions, right side greater than left. 3. Small amount of ascites associated with the peritoneal dialysis catheter. 4. Prominent mediastinal lymph nodes are likely reactive but nonspecific. 5. Aortic Atherosclerosis (ICD10-I70.0). 6. 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 Electronically Signed   By: Markus Daft M.D.   On: 03/08/2022 17:06   DG Abd Portable 1V  Result Date: 03/08/2022 CLINICAL DATA:  Dyspnea EXAM: PORTABLE ABDOMEN - 1 VIEW COMPARISON:  03/04/2022 FINDINGS: Bowel gas pattern is nonspecific. Peritoneal dialysis catheter is seen with its tip in the right side of pelvis. Arterial calcifications are seen, especially prominent in tortuous splenic artery. Surgical clips are seen in right upper quadrant. IMPRESSION: Nonspecific bowel gas pattern. Electronically Signed   By: Elmer Picker M.D.   On: 03/08/2022 16:02   DG CHEST PORT 1 VIEW  Result Date: 03/08/2022 CLINICAL DATA:  Dyspnea EXAM: PORTABLE CHEST 1 VIEW COMPARISON:  Previous studies including  the examination of 03/07/2022 FINDINGS: Transverse diameter of heart is increased. There is interval worsening of pulmonary vascular congestion. Increased interstitial and alveolar markings are seen in parahilar regions and lower lung fields. Lateral CP angles are clear. There is no pneumothorax. IMPRESSION: Cardiomegaly. Central pulmonary vessels are prominent suggesting CHF. Possibility of underlying pneumonia is not excluded. Electronically Signed   By: Elmer Picker M.D.   On: 03/08/2022 16:01   Medications: Infusions:  sodium chloride Stopped (03/04/22 1031)   dialysis solution 2.5% low-MG/low-CA     dialysis solution 4.25% low-MG/low-CA      Scheduled Medications:  sodium chloride   Intravenous Once   aspirin  81 mg Oral Daily   atorvastatin  20 mg Oral Daily   carvedilol  3.125 mg Oral BID WC   Chlorhexidine Gluconate Cloth  6 each Topical Daily   darbepoetin (ARANESP) injection - NON-DIALYSIS  200 mcg Subcutaneous Q Mon-1800   gentamicin cream  1 Application Topical Daily   guaiFENesin  600 mg Oral BID   heparin injection (subcutaneous)  5,000 Units Subcutaneous Q8H   insulin aspart  1-3 Units Subcutaneous Q4H   insulin detemir  5 Units Subcutaneous Q12H   melatonin  3 mg Oral QHS   pantoprazole  40 mg Oral Daily   QUEtiapine  50 mg Oral QHS   sevelamer carbonate  800 mg Oral TID WC   sodium chloride flush  10-40 mL Intracatheter Q12H    have reviewed scheduled and prn medications.  Physical Exam:    General adult male in bed in no acute distress HEENT normocephalic atraumatic extraocular movements intact sclera anicteric Neck supple trachea midline Lungs clear bilaterally, normal work of breathing on room air Heart S1S2 no rub Abdomen soft nontender distended; PD catheter in place and dressed Extremities no edema appreciated; no cyanosis Psych no current agitation  Neuro - alert, following commands   Kadrian Partch Larina Earthly PA-C [facility] 03/10/2022,10:44 AM

## 2022-03-10 NOTE — Progress Notes (Signed)
Progress Note  Patient Name: William [name] Date of Encounter: 03/10/2022  Primary Cardiologist: None  Subjective   No acute events overnight.  Patient denied any symptoms of chest pain or SOB.  He completed a course of antibiotics.  Telemetry reviewed and showed 13 beat run of nonsustained ventricular tachycardia.  Inpatient Medications    Scheduled Meds:  sodium chloride   Intravenous Once   aspirin  81 mg Oral Daily   atorvastatin  20 mg Oral Daily   carvedilol  3.125 mg Oral BID WC   Chlorhexidine Gluconate Cloth  6 each Topical Daily   darbepoetin (ARANESP) injection - NON-DIALYSIS  200 mcg Subcutaneous Q Mon-1800   gentamicin cream  1 Application Topical Daily   guaiFENesin  600 mg Oral BID   heparin injection (subcutaneous)  5,000 Units Subcutaneous Q8H   insulin aspart  1-3 Units Subcutaneous Q4H   insulin detemir  5 Units Subcutaneous Q12H   melatonin  3 mg Oral QHS   pantoprazole  40 mg Oral Daily   polyethylene glycol  17 g Oral Once   QUEtiapine  50 mg Oral QHS   senna-docusate  1 tablet Oral Once   sevelamer carbonate  800 mg Oral TID WC   sodium chloride flush  10-40 mL Intracatheter Q12H   Continuous Infusions:  sodium chloride Stopped (03/04/22 1031)   dialysis solution 2.5% low-MG/low-CA     dialysis solution 4.25% low-MG/low-CA     PRN Meds: sodium chloride, acetaminophen **OR** acetaminophen, calcium carbonate, guaiFENesin-dextromethorphan, haloperidol lactate, ipratropium-albuterol, metoprolol tartrate, mouth rinse, polyethylene glycol **FOLLOWED BY** polyethylene glycol, prochlorperazine, sodium chloride flush, white petrolatum   Vital Signs    Vitals:   03/09/22 1933 03/09/22 2314 03/10/22 0434 03/10/22 0810  BP: 128/87 135/88 127/78 (!) 124/58  Pulse: 85 83 77 82  Resp: '18 17 18   '$ Temp: 97.7 F (36.5 C) 97.8 F (36.6 C) 98.2 F (36.8 C)   TempSrc: Oral Oral Oral   SpO2: 95% 97% 98% 93%  Weight:      Height:        Intake/Output  Summary (Last 24 hours) at 03/10/2022 0932 Last data filed at 03/10/2022 8119 Gross per 24 hour  Intake 120 ml  Output 1234 ml  Net -1114 ml   Filed Weights   03/07/22 1728 03/08/22 0657 03/09/22 0733  Weight: 83.9 kg 84.2 kg 83.6 kg    Telemetry     Personally reviewed, 13 beat of nonsustained V. Tach.  His heart rates were also elevated in 120s when he was up and walking around.  ECG    Diffuse ST segment depressions except in V1 and aVR where there is ST elevation  Physical Exam   GEN: No acute distress.   Neck: No JVD. Cardiac: RRR, no murmur, rub, or gallop.  Respiratory: Nonlabored. Clear to auscultation bilaterally. GI: Soft, nontender, bowel sounds present. MS: No edema; No deformity. Neuro:  Nonfocal. Psych: Alert and oriented x 3. Normal affect.  Labs    Chemistry Recent Labs  Lab 03/08/22 1520 03/09/22 0731 03/10/22 0057  NA 135 140 139  K 3.7 3.5 3.2*  CL 96* 102 101  CO2 21* 21* 23  GLUCOSE 129* 112* 146*  BUN 76* 73* 72*  CREATININE 6.89* 6.70* 7.15*  CALCIUM 8.3* 8.5* 8.7*  PROT 6.1*  --   --   ALBUMIN 2.8*  --   --   AST 32  --   --   ALT 31  --   --  ALKPHOS 49  --   --   BILITOT 0.6  --   --   GFRNONAA 8* 8* 7*  ANIONGAP 18* 17* 15     Hematology Recent Labs  Lab 03/08/22 1520 03/09/22 0731 03/10/22 0057  WBC 14.5* 13.2* 12.8*  RBC 2.89* 2.92* 3.13*  HGB 9.8* 9.6* 10.5*  HCT 29.0* 29.5* 30.8*  MCV 100.3* 101.0* 98.4  MCH 33.9 32.9 33.5  MCHC 33.8 32.5 34.1  RDW 12.3 13.6 13.3  PLT 346 303 340    Cardiac Enzymes Recent Labs  Lab 03/08/22 0553 03/08/22 0756 03/08/22 0917 03/08/22 1520 03/08/22 1850  TROPONINIHS 2,478* 2,695* 2,560* 1,534* 1,415*    BNPNo results for input(s): "BNP", "PROBNP" in the last 168 hours.   DDimerNo results for input(s): "DDIMER" in the last 168 hours.   Radiology    CT CHEST ABDOMEN PELVIS WO CONTRAST  Result Date: 03/08/2022 CLINICAL DATA:  82 year old with shortness of breath and  abdominal pain. End-stage renal disease on peritoneal dialysis. EXAM: CT CHEST, ABDOMEN AND PELVIS WITHOUT CONTRAST TECHNIQUE: Multidetector CT imaging of the chest, abdomen and pelvis was performed following the standard protocol without IV contrast. RADIATION DOSE REDUCTION: This exam was performed according to the departmental dose-optimization program which includes automated exposure control, adjustment of the mA and/or kV according to patient size and/or use of iterative reconstruction technique. COMPARISON:  Chest radiograph 03/08/2022 and CT chest abdomen and pelvis 01/21/2019 FINDINGS: CT CHEST FINDINGS Cardiovascular: Extensive coronary artery calcifications. Heart size is slightly enlarged. No significant pericardial effusion. Normal caliber of the thoracic aorta with atherosclerotic calcifications. Mediastinum/Nodes: Prominent mediastinal lymph nodes. Right paratracheal lymph node on sequence 3 image 19 measures up to 1.5 cm in the short axis and previously measured 0.7 cm. Limited evaluation for hilar lymph node enlargement due to the lack of intravascular contrast. No axillary lymph node enlargement. Lungs/Pleura: Small left pleural effusion and moderate sized right pleural effusion. Patchy confluent airspace densities in both lungs, most prominent in the upper lobes. Findings are suggestive for multifocal pneumonia. Musculoskeletal: No acute bone abnormality. CT ABDOMEN PELVIS FINDINGS Hepatobiliary: Small amount of perihepatic ascites. Cholecystectomy. No gross abnormality to the liver. Pancreas: Unremarkable. No pancreatic ductal dilatation or surrounding inflammatory changes. Spleen: Limited evaluation due to motion artifact. Small amount of perisplenic ascites. Adrenals/Urinary Tract: Normal appearance of the adrenal glands. Both kidneys are atrophic without hydronephrosis. Normal appearance of the urinary bladder containing fluid. Small renal lesions likely represent cysts and do not require  dedicated follow-up. Stomach/Bowel: Diverticula involving the duodenum near the ampulla. No evidence for bowel dilatation or obstruction. Colonic diverticula without acute bowel inflammation. Vascular/Lymphatic: Diffuse atherosclerotic calcifications in the abdominal aorta and iliac arteries. Negative for abdominal aortic aneurysm. No lymph node enlargement in the abdomen or pelvis. Reproductive: Prostate is prominent for size. Normal appearance of seminal vesicles. Other: Peritoneal dialysis catheter is coiled in the right anterior pelvis. Small amount of ascites or free fluid in the pelvis. Negative for free air. Again noted is a lobulated soft tissue structure containing fat in the right anterior abdomen measuring up to 7.5 cm on image 73/3. This is a chronic finding and probably represent prior inflammation or infarction in the omentum. Left inguinal hernia containing fat. Musculoskeletal: No acute bone abnormality. IMPRESSION: 1. Patchy airspace disease in both lungs is suggestive for multifocal pneumonia. 2. Bilateral pleural effusions, right side greater than left. 3. Small amount of ascites associated with the peritoneal dialysis catheter. 4. Prominent mediastinal lymph nodes are likely reactive  but nonspecific. 5. Aortic Atherosclerosis (ICD10-I70.0). 6. 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 Electronically Signed   By: Markus Daft M.D.   On: 03/08/2022 17:06   DG Abd Portable 1V  Result Date: 03/08/2022 CLINICAL DATA:  Dyspnea EXAM: PORTABLE ABDOMEN - 1 VIEW COMPARISON:  03/04/2022 FINDINGS: Bowel gas pattern is nonspecific. Peritoneal dialysis catheter is seen with its tip in the right side of pelvis. Arterial calcifications are seen, especially prominent in tortuous splenic artery. Surgical clips are seen in right upper quadrant. IMPRESSION: Nonspecific bowel gas pattern. Electronically Signed   By: Elmer Picker M.D.   On: 03/08/2022  16:02   DG CHEST PORT 1 VIEW  Result Date: 03/08/2022 CLINICAL DATA:  Dyspnea EXAM: PORTABLE CHEST 1 VIEW COMPARISON:  Previous studies including the examination of 03/07/2022 FINDINGS: Transverse diameter of heart is increased. There is interval worsening of pulmonary vascular congestion. Increased interstitial and alveolar markings are seen in parahilar regions and lower lung fields. Lateral CP angles are clear. There is no pneumothorax. IMPRESSION: Cardiomegaly. Central pulmonary vessels are prominent suggesting CHF. Possibility of underlying pneumonia is not excluded. Electronically Signed   By: Elmer Picker M.D.   On: 03/08/2022 16:01    Cardiac Studies     Assessment & Plan    Patient is 82 year old M known to have ESRD on PD, HTN, hypothyroidism, HLD, mild MR presented with acute hypoxic respiratory failure secondary to significant pneumonia and NSTEMI.  # Nonsustained ventricular tachycardia, 13 beat -Continue carvedilol 3.125 mg twice daily -Replete electrolytes, keep K>4 and <5; Mg>2 and <3.  K is 3.2 today.  # NSTEMI, likely type II in the setting of pneumonia but type I cannot be ruled out completely -Patient's troponins initially were 800-900s but peaked to 2,695 during this hospitalization. EKG showed diffuse ST segment depressions except in aVR and V1 that has ST elevation. Echo showed LVEF 40 to 45%. Patient denied any chest pain or SOB. He will benefit from invasive ischemic evaluation with LHC prior to discharge from the hospital. Antibiotic course for pneumonia is completed and his delirium/confusion seems resolved. Hemoglobin never dropped less than 7 and has no bleeding episodes.  -Continue aspirin 81 mg once daily -Continue atorvastatin 20 mg nightly -Continue carvedilol 3.125 mg twice daily  # Acute hypoxic respiratory failure secondary to pneumonia, extubated on 03/05/2022: Patient completed antibiotic course for pneumonia.  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 No bleeding episodes during this hospitalization. Hemoglobin was 7.9 when he received PRBC transfusion. It never dropped less than 7.  # HFrEF, LVEF 40 to 45%, rule out CAD: Continue carvedilol 3.125 mg twice daily.  # HLD: Not on therapy prior to admission.  LDL 122 in 01/2022.  Repeat lipid panel with LDL at 94.  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  Atorvastatin 20 mg nightly is initiated during this hospitalization.  I have spent a total of 33 minutes with patient reviewing chart , telemetry, EKGs, labs and examining patient as well as establishing an assessment and plan that was discussed with the patient.  > 50% of time was spent in direct patient care.     Signed, Chalmers Guest, MD  03/10/2022, 9:32 AM

## 2022-03-10 NOTE — Progress Notes (Signed)
PROGRESS NOTE  William [name] VOH:[id number] DOB: Sep 17, 1940   PCP: [name], PA-C  Patient is from: Home.  DOA: 03/02/2022 LOS: 7  Chief complaints Chief Complaint  Patient presents with   Emesis   Shortness of Breath     Brief Narrative / Interim history: 82 year old man with hx of combined CHF, ESRD on PD, HTN, HLD and BPH presenting with with productive cough, dyspnea on exertion and subjective fever and admitted to ICU for acute respiratory failure with hypoxia in the setting of multifocal pneumonia.  He was intubated from 12/29-1/1.  Started on IV ceftriaxone and azithromycin on 12/29, and vancomycin on 12/30.  He was transferred out of ICU on 1/2 but developed increasing agitation, confusion the night of 1/3 and started on Precedex drip and sent back to ICU.  EKG on 1/4 with increased ST/T changes in inferolateral leads.  He also had significantly elevated troponin, 800>>> 2700 (peak).  TTE with LVEF of 40 to 45% and G1 DD.  Cardiology consulted.   Eventually, delirium improved, came off Precedex drip and transferred to [facility] hospitalist service on 03/10/2021.  Completed antibiotic course on 03/10/2021.  Respiratory failure resolved.  Cardiology and nephrology following.  Cardiology planning LHC before discharge.  Subjective: Seen and examined earlier this morning.  Sitting on bedside chair with his daughter.  No complaints other than intermittent cough.  Denies chest pain, shortness of breath, nausea, vomiting or abdominal pain.  Objective: Vitals:   03/09/22 2314 03/10/22 0434 03/10/22 0810 03/10/22 1146  BP: 135/88 127/78 (!) 124/58 117/78  Pulse: 83 77 82 (!) 111  Resp: _0 Temp: 97.8 F (36.6 C) 98.2 F (36.8 C)  98.4 F (36.9 C)  TempSrc: Oral Oral  Oral  SpO2: 97% 98% 93% 95%  Weight:      Height:        Examination:  GENERAL: No apparent distress.  Nontoxic. HEENT: MMM.  Vision and hearing grossly intact.  NECK: Supple.  No apparent  JVD.  RESP:  No IWOB.  Fair aeration bilaterally. CVS:  RRR. Heart sounds normal.  ABD/GI/GU: BS+. Abd soft, NTND.  MSK/EXT:  Moves extremities.  Significant muscle mass and subcu fat loss. SKIN: no apparent skin lesion or wound NEURO: Awake, alert and oriented appropriately.  No apparent focal neuro deficit. PSYCH: Calm. Normal affect.   Procedures:  12/29-1/1 intubation and mechanical ventilation  Microbiology summarized: COVID-19, influenza and RSV PCR nonreactive. MRSA PCR screen negative. Blood cultures NGTD  Assessment and plan: Principal Problem:   Acute respiratory failure with hypoxia (HCC) Active Problems:   Hypertension   ESRD (end stage renal disease) ([place])   Community acquired pneumonia   Hyponatremia   Anemia in chronic kidney disease (CKD)   Acute metabolic acidosis  Acute hypoxemic respiratory failure due to multifocal pneumonia: CT chest, abdomen and pelvis with patchy airspace disease in both lungs suggesting multifocal pneumonia, moderate right effusion and prominent mediastinal lymph nodes.  Culture data as above.  Respiratory failure resolved.  Completed antibiotic and steroid course. -Pulmonary toilet with incentive spirometry -Mucolytic's and antitussives.  Agitation/delirium: Seems to have resolved.  Required Precedex while in ICU -Decrease p.o. Seroquel to 25 mg at night -IV Haldol 1 mg every 6 hours as needed -Monitor QTc, 458 on 1/5. -Optimize electrolytes   Non-STEMI: EKG EKG showed diffuse ST segment depressions except in aVR and V1 that has ST elevation. Echo showed LVEF 40 to 45%. Patient denied any chest pain or  SOB.  Troponin trended from [id number] (peak).  Patient without chest pain or shortness of breath at the moment. -Continue low-dose aspirin, Lipitor and Coreg -Cardiology suggesting LHC prior to discharge for ischemic evaluation  Acute combined CHF: TTE with LVEF of 40 to 45% and G1 DD.  Appears euvolemic as of now. -Fluid management by  peritoneal dialysis per nephrology.  ESRD on peritoneal dialysis. Bone mineral disorder/hyperphosphatemia/hypocalcemia/hyponatremia -Peritoneal dialysis per nephrology  Anemia of renal disease: Transfused 1 unit on 1/4 with appropriate response.  No signs of bleeding.  H&H stable and better than baseline. Recent Labs    03/03/22 2015 03/04/22 0220 03/04/22 1116 03/05/22 0416 03/06/22 0313 03/07/22 0043 03/08/22 0553 03/08/22 1520 03/09/22 0731 03/10/22 0057  HGB 8.2* 8.3* 9.5* 8.0* 8.5* 9.6* 7.9* 9.8* 9.6* 10.5*  -Continue monitoring  Essential hypertension: Normotensive. -Cardiac meds as above  Generalized weakness/physical deconditioning -PT/OT-recommended HH PT/OT.  Hypokalemia -Monitor and replenish as appropriate  Constipation -Bowel regimen   Body mass index is 25 kg/m.          DVT prophylaxis:  heparin injection 5,000 Units Start: 03/04/22 1500  Code Status: DNR/DNI Family Communication: Updated patient's daughter at bedside. Level of care: Telemetry Cardiac Status is: Inpatient Remains inpatient appropriate because: Non-STEMI   Final disposition: Home with home health once cleared Consultants:  Pulmonology admitted patient Nephrology Cardiology  Sch Meds:  Scheduled Meds:  sodium chloride   Intravenous Once   aspirin  81 mg Oral Daily   atorvastatin  20 mg Oral Daily   carvedilol  3.125 mg Oral BID WC   Chlorhexidine Gluconate Cloth  6 each Topical Daily   darbepoetin (ARANESP) injection - NON-DIALYSIS  200 mcg Subcutaneous Q Mon-1800   gentamicin cream  1 Application Topical Daily   guaiFENesin  600 mg Oral BID   heparin injection (subcutaneous)  5,000 Units Subcutaneous Q8H   insulin aspart  1-3 Units Subcutaneous Q4H   insulin detemir  5 Units Subcutaneous Q12H   melatonin  3 mg Oral QHS   pantoprazole  40 mg Oral Daily   QUEtiapine  50 mg Oral QHS   sevelamer carbonate  800 mg Oral TID WC   sodium chloride flush  10-40 mL  Intracatheter Q12H   Continuous Infusions:  sodium chloride Stopped (03/04/22 1031)   dialysis solution 2.5% low-MG/low-CA     dialysis solution 4.25% low-MG/low-CA     PRN Meds:.sodium chloride, acetaminophen **OR** acetaminophen, calcium carbonate, guaiFENesin-dextromethorphan, haloperidol lactate, ipratropium-albuterol, metoprolol tartrate, mouth rinse, [COMPLETED] polyethylene glycol **FOLLOWED BY** polyethylene glycol, prochlorperazine, sodium chloride flush, white petrolatum  Antimicrobials: Anti-infectives (From admission, onward)    Start     Dose/Rate Route Frequency Ordered Stop   03/03/22 2200  cefTRIAXone (ROCEPHIN) 2 g in sodium chloride 0.9 % 100 mL IVPB        2 g 200 mL/hr over 30 Minutes Intravenous Every 24 hours 03/03/22 1546 03/10/22 0028   03/03/22 2000  azithromycin (ZITHROMAX) 500 mg in sodium chloride 0.9 % 250 mL IVPB        500 mg 250 mL/hr over 60 Minutes Intravenous Every 24 hours 03/03/22 1546 03/06/22 2253   03/03/22 1700  vancomycin (VANCOREADY) IVPB 2000 mg/400 mL        2,000 mg 200 mL/hr over 120 Minutes Intravenous NOW 03/03/22 1608 03/04/22 1011   03/03/22 1611  vancomycin variable dose per unstable renal function (pharmacist dosing)  Status:  Discontinued         Does not apply See admin  instructions 03/03/22 1611 03/06/22 0838   03/02/22 2300  cefTRIAXone (ROCEPHIN) 1 g in sodium chloride 0.9 % 100 mL IVPB        1 g 200 mL/hr over 30 Minutes Intravenous  Once 03/02/22 2245 03/02/22 2345   03/02/22 2300  azithromycin (ZITHROMAX) 500 mg in sodium chloride 0.9 % 250 mL IVPB        500 mg 250 mL/hr over 60 Minutes Intravenous  Once 03/02/22 2245 03/03/22 0108        I have personally reviewed the following labs and images: CBC: Recent Labs  Lab 03/07/22 0043 03/08/22 0553 03/08/22 1520 03/09/22 0731 03/10/22 0057  WBC 12.3* 10.3 14.5* 13.2* 12.8*  NEUTROABS  --   --   --   --  10.7*  HGB 9.6* 7.9* 9.8* 9.6* 10.5*  HCT 28.4* 22.9* 29.0*  29.5* 30.8*  MCV 101.1* 100.0 100.3* 101.0* 98.4  PLT 352 273 346 303 340   BMP &GFR Recent Labs  Lab 03/05/22 0416 03/06/22 0313 03/07/22 0043 03/08/22 0553 03/08/22 1520 03/09/22 0731 03/10/22 0057  NA 135 139 138 135 135 140 139  K 4.0 3.7 3.6 3.5 3.7 3.5 3.2*  CL 99 99 98 98 96* 102 101  CO2 22 21* 21* 22 21* 21* 23  GLUCOSE 104* 129* 164* 111* 129* 112* 146*  BUN 65* 71* 73* 71* 76* 73* 72*  CREATININE 6.68* 6.67* 6.74* 6.51* 6.89* 6.70* 7.15*  CALCIUM 8.2* 8.4* 8.4* 7.9* 8.3* 8.5* 8.7*  MG 2.4 2.4 2.2 2.2 2.2  --   --   PHOS 6.5* 7.6* 7.9* 7.1* 6.9*  --   --    Estimated Creatinine Clearance: 9 mL/min (A) (by C-G formula based on SCr of 7.15 mg/dL (H)). Liver & Pancreas: Recent Labs  Lab 03/08/22 1520  AST 32  ALT 31  ALKPHOS 49  BILITOT 0.6  PROT 6.1*  ALBUMIN 2.8*   No results for input(s): "LIPASE", "AMYLASE" in the last 168 hours. No results for input(s): "AMMONIA" in the last 168 hours. Diabetic: No results for input(s): "HGBA1C" in the last 72 hours. Recent Labs  Lab 03/09/22 1545 03/09/22 2044 03/09/22 2313 03/10/22 0436 03/10/22 1143  GLUCAP 184* 181* 217* 142* 104*   Cardiac Enzymes: No results for input(s): "CKTOTAL", "CKMB", "CKMBINDEX", "TROPONINI" in the last 168 hours. No results for input(s): "PROBNP" in the last 8760 hours. Coagulation Profile: No results for input(s): "INR", "PROTIME" in the last 168 hours. Thyroid Function Tests: No results for input(s): "TSH", "T4TOTAL", "FREET4", "T3FREE", "THYROIDAB" in the last 72 hours. Lipid Profile: No results for input(s): "CHOL", "HDL", "LDLCALC", "TRIG", "CHOLHDL", "LDLDIRECT" in the last 72 hours. Anemia Panel: No results for input(s): "VITAMINB12", "FOLATE", "FERRITIN", "TIBC", "IRON", "RETICCTPCT" in the last 72 hours. Urine analysis:    Component Value Date/Time   COLORURINE YELLOW 12/30/2014 1820   APPEARANCEUR CLOUDY (A) 12/30/2014 1820   LABSPEC 1.013 12/30/2014 1820   PHURINE  5.5 12/30/2014 1820   GLUCOSEU NEGATIVE 12/30/2014 1820   HGBUR TRACE (A) 12/30/2014 1820   BILIRUBINUR NEGATIVE 12/30/2014 1820   KETONESUR NEGATIVE 12/30/2014 1820   PROTEINUR 100 (A) 12/30/2014 1820   UROBILINOGEN 0.2 12/30/2014 1820   NITRITE NEGATIVE 12/30/2014 1820   LEUKOCYTESUR LARGE (A) 12/30/2014 1820   Sepsis Labs: Invalid input(s): "PROCALCITONIN", "LACTICIDVEN"  Microbiology: Recent Results (from the past 240 hour(s))  Resp panel by RT-PCR (RSV, Flu A&B, Covid) Anterior Nasal Swab     Status: None   Collection Time: 03/02/22  9:19 PM   Specimen: Anterior Nasal Swab  Result Value Ref Range Status   SARS Coronavirus 2 by RT PCR NEGATIVE NEGATIVE Final    Comment: (NOTE) SARS-CoV-2 target nucleic acids are NOT DETECTED.  The SARS-CoV-2 RNA is generally detectable in upper respiratory specimens during the acute phase of infection. The lowest concentration of SARS-CoV-2 viral copies this assay can detect is 138 copies/mL. A negative result does not preclude SARS-Cov-2 infection and should not be used as the sole basis for treatment or other patient management decisions. A negative result may occur with  improper specimen collection/handling, submission of specimen other than nasopharyngeal swab, presence of viral mutation(s) within the areas targeted by this assay, and inadequate number of viral copies(<138 copies/mL). A negative result must be combined with clinical observations, patient history, and epidemiological information. The expected result is Negative.  Fact Sheet for Patients:  EntrepreneurPulse.com.au  Fact Sheet for Healthcare Providers:  IncredibleEmployment.be  This test is no t yet approved or cleared by the [place] FDA and  has been authorized for detection and/or diagnosis of SARS-CoV-2 by FDA under an Emergency Use Authorization (EUA). This EUA will remain  in effect (meaning this test can be used) for  the duration of the COVID-19 declaration under Section 564(b)(1) of the Act, 21 U.S.C.section 360bbb-3(b)(1), unless the authorization is terminated  or revoked sooner.       Influenza A by PCR NEGATIVE NEGATIVE Final   Influenza B by PCR NEGATIVE NEGATIVE Final    Comment: (NOTE) The Xpert Xpress SARS-CoV-2/FLU/RSV plus assay is intended as an aid in the diagnosis of influenza from Nasopharyngeal swab specimens and should not be used as a sole basis for treatment. Nasal washings and aspirates are unacceptable for Xpert Xpress SARS-CoV-2/FLU/RSV testing.  Fact Sheet for Patients: EntrepreneurPulse.com.au  Fact Sheet for Healthcare Providers: IncredibleEmployment.be  This test is not yet approved or cleared by the [place] FDA and has been authorized for detection and/or diagnosis of SARS-CoV-2 by FDA under an Emergency Use Authorization (EUA). This EUA will remain in effect (meaning this test can be used) for the duration of the COVID-19 declaration under Section 564(b)(1) of the Act, 21 U.S.C. section 360bbb-3(b)(1), unless the authorization is terminated or revoked.     Resp Syncytial Virus by PCR NEGATIVE NEGATIVE Final    Comment: (NOTE) Fact Sheet for Patients: EntrepreneurPulse.com.au  Fact Sheet for Healthcare Providers: IncredibleEmployment.be  This test is not yet approved or cleared by the [place] FDA and has been authorized for detection and/or diagnosis of SARS-CoV-2 by FDA under an Emergency Use Authorization (EUA). This EUA will remain in effect (meaning this test can be used) for the duration of the COVID-19 declaration under Section 564(b)(1) of the Act, 21 U.S.C. section 360bbb-3(b)(1), unless the authorization is terminated or revoked.  Performed at [facility], [address] [place], [place] [address]   Blood culture (routine x 2)     Status: None    Collection Time: 03/02/22 10:50 PM   Specimen: BLOOD  Result Value Ref Range Status   Specimen Description   Final    BLOOD RIGHT ANTECUBITAL Performed at [facility], [address] [place], [place] [address]    Special Requests   Final    BOTTLES DRAWN AEROBIC AND ANAEROBIC Blood Culture adequate volume Performed at [facility], [address] [place], [place] [address]    Culture   Final    NO GROWTH  5 DAYS Performed at [facility], [place] [address] [place], [place] [address]    Report Status 03/08/2022 FINAL  Final  Blood culture (routine x 2)     Status: None   Collection Time: 03/02/22 10:54 PM   Specimen: BLOOD LEFT WRIST  Result Value Ref Range Status   Specimen Description   Final    BLOOD LEFT WRIST Performed at [facility], [address] [place], [place] [address]    Special Requests   Final    BOTTLES DRAWN AEROBIC AND ANAEROBIC Blood Culture adequate volume Performed at [facility], [address] [place], [place] [address]    Culture   Final    NO GROWTH 5 DAYS Performed at [facility], [place] [address], [place], [place] [address]    Report Status 03/08/2022 FINAL  Final  MRSA Next Gen by PCR, Nasal     Status: None   Collection Time: 03/03/22  4:28 PM   Specimen: Nasal Mucosa; Nasal Swab  Result Value Ref Range Status   MRSA by PCR Next Gen NOT DETECTED NOT DETECTED Final    Comment: (NOTE) The GeneXpert MRSA Assay (FDA approved for NASAL specimens only), is one component of a comprehensive MRSA colonization surveillance program. It is not intended to diagnose MRSA infection nor to guide or monitor treatment for MRSA infections. Test performance is not FDA approved in patients less than 34 years old. Performed at [facility], [place] [address] [place], [place] [address]   Respiratory (~20 pathogens) panel by PCR     Status: None   Collection Time: 03/03/22  4:31 PM   Specimen:  Nasopharyngeal Swab; Respiratory  Result Value Ref Range Status   Adenovirus NOT DETECTED NOT DETECTED Final   Coronavirus 229E NOT DETECTED NOT DETECTED Final    Comment: (NOTE) The Coronavirus on the Respiratory Panel, DOES NOT test for the novel  Coronavirus (2019 nCoV)    Coronavirus HKU1 NOT DETECTED NOT DETECTED Final   Coronavirus NL63 NOT DETECTED NOT DETECTED Final   Coronavirus OC43 NOT DETECTED NOT DETECTED Final   Metapneumovirus NOT DETECTED NOT DETECTED Final   Rhinovirus / Enterovirus NOT DETECTED NOT DETECTED Final   Influenza A NOT DETECTED NOT DETECTED Final   Influenza B NOT DETECTED NOT DETECTED Final   Parainfluenza Virus 1 NOT DETECTED NOT DETECTED Final   Parainfluenza Virus 2 NOT DETECTED NOT DETECTED Final   Parainfluenza Virus 3 NOT DETECTED NOT DETECTED Final   Parainfluenza Virus 4 NOT DETECTED NOT DETECTED Final   Respiratory Syncytial Virus NOT DETECTED NOT DETECTED Final   Bordetella pertussis NOT DETECTED NOT DETECTED Final   Bordetella Parapertussis NOT DETECTED NOT DETECTED Final   Chlamydophila pneumoniae NOT DETECTED NOT DETECTED Final   Mycoplasma pneumoniae NOT DETECTED NOT DETECTED Final    Comment: Performed at [facility], [place]. [address], [place], [place] [address]  Culture, Respiratory w Gram Stain     Status: None   Collection Time: 03/03/22  6:04 PM   Specimen: Bronchoalveolar Lavage; Respiratory  Result Value Ref Range Status   Specimen Description BRONCHIAL ALVEOLAR LAVAGE  Final   Special Requests NONE  Final   Gram Stain RARE YEAST NO WBC SEEN   Final   Culture   Final    RARE Normal respiratory flora-no Staph aureus or Pseudomonas seen Performed at [facility], [address], [place], [place] [address]  Report Status 03/06/2022 FINAL  Final  MRSA Next Gen by PCR, Nasal     Status: None   Collection Time: 03/07/22  4:04 AM   Specimen: Nasal Mucosa; Nasal Swab  Result Value Ref Range Status   MRSA by PCR Next  Gen NOT DETECTED NOT DETECTED Final    Comment: (NOTE) The GeneXpert MRSA Assay (FDA approved for NASAL specimens only), is one component of a comprehensive MRSA colonization surveillance program. It is not intended to diagnose MRSA infection nor to guide or monitor treatment for MRSA infections. Test performance is not FDA approved in patients less than 66 years old. Performed at [facility], [place] [address], [place], [place] [address]     Radiology Studies: No results found.    Julianny Milstein T. [place]  If 7PM-7AM, please contact night-coverage [email or web address] 03/10/2022, 3:21 PM

## 2022-03-11 ENCOUNTER — Encounter (HOSPITAL_COMMUNITY): Payer: Self-pay | Admitting: Internal Medicine

## 2022-03-11 DIAGNOSIS — J9601 Acute respiratory failure with hypoxia: Secondary | ICD-10-CM | POA: Diagnosis not present

## 2022-03-11 DIAGNOSIS — I214 Non-ST elevation (NSTEMI) myocardial infarction: Secondary | ICD-10-CM | POA: Diagnosis not present

## 2022-03-11 DIAGNOSIS — E7849 Other hyperlipidemia: Secondary | ICD-10-CM | POA: Diagnosis not present

## 2022-03-11 DIAGNOSIS — D638 Anemia in other chronic diseases classified elsewhere: Secondary | ICD-10-CM | POA: Diagnosis not present

## 2022-03-11 DIAGNOSIS — Z992 Dependence on renal dialysis: Secondary | ICD-10-CM

## 2022-03-11 LAB — CBC
HCT: 33.3 % — ABNORMAL LOW (ref 39.0–52.0)
Hemoglobin: 11.1 g/dL — ABNORMAL LOW (ref 13.0–17.0)
MCH: 33.3 pg (ref 26.0–34.0)
MCHC: 33.3 g/dL (ref 30.0–36.0)
MCV: 100 fL (ref 80.0–100.0)
Platelets: 318 10*3/uL (ref 150–400)
RBC: 3.33 MIL/uL — ABNORMAL LOW (ref 4.22–5.81)
RDW: 13.1 % (ref 11.5–15.5)
WBC: 12.6 10*3/uL — ABNORMAL HIGH (ref 4.0–10.5)
nRBC: 0.9 % — ABNORMAL HIGH (ref 0.0–0.2)

## 2022-03-11 LAB — RENAL FUNCTION PANEL
Albumin: 2.6 g/dL — ABNORMAL LOW (ref 3.5–5.0)
Anion gap: 16 — ABNORMAL HIGH (ref 5–15)
BUN: 64 mg/dL — ABNORMAL HIGH (ref 8–23)
CO2: 24 mmol/L (ref 22–32)
Calcium: 8.8 mg/dL — ABNORMAL LOW (ref 8.9–10.3)
Chloride: 100 mmol/L (ref 98–111)
Creatinine, Ser: 6.68 mg/dL — ABNORMAL HIGH (ref 0.61–1.24)
GFR, Estimated: 8 mL/min — ABNORMAL LOW (ref >60)
Glucose, Bld: 140 mg/dL — ABNORMAL HIGH (ref 70–99)
Phosphorus: 5 mg/dL — ABNORMAL HIGH (ref 2.5–4.6)
Potassium: 3.1 mmol/L — ABNORMAL LOW (ref 3.5–5.1)
Sodium: 140 mmol/L (ref 135–145)

## 2022-03-11 LAB — GLUCOSE, CAPILLARY
Glucose-Capillary: 153 mg/dL — ABNORMAL HIGH (ref 70–99)
Glucose-Capillary: 146 mg/dL — ABNORMAL HIGH (ref 70–99)
Glucose-Capillary: 115 mg/dL — ABNORMAL HIGH (ref 70–99)
Glucose-Capillary: 126 mg/dL — ABNORMAL HIGH (ref 70–99)

## 2022-03-11 LAB — MAGNESIUM: Magnesium: 2.1 mg/dL (ref 1.7–2.4)

## 2022-03-11 MED ORDER — SODIUM CHLORIDE 0.9% FLUSH
3.0000 mL | Freq: Two times a day (BID) | INTRAVENOUS | Status: DC
  Administered 2022-03-11 – 2022-03-14 (×6): 3 mL via INTRAVENOUS

## 2022-03-11 MED ORDER — DELFLEX-LC/2.5% DEXTROSE 394 MOSM/L IP SOLN: INTRAPERITONEAL | Status: DC

## 2022-03-11 MED ORDER — POTASSIUM CHLORIDE CRYS ER 20 MEQ PO TBCR
40.0000 meq | EXTENDED_RELEASE_TABLET | Freq: Once | ORAL | Status: DC
  Filled 2022-03-11: qty 2

## 2022-03-11 MED ORDER — POTASSIUM CHLORIDE CRYS ER 20 MEQ PO TBCR
40.0000 meq | EXTENDED_RELEASE_TABLET | Freq: Two times a day (BID) | ORAL | Status: DC
  Administered 2022-03-11 – 2022-03-12 (×4): 40 meq via ORAL
  Filled 2022-03-11 (×5): qty 2

## 2022-03-11 NOTE — Significant Event (Signed)
pt vomiting at bedside report. cough induced with history of frequent acid reflux and vomiting. Daughter at bedside asked about a GI  Gave antiemetic specialists.Made MD aware

## 2022-03-11 NOTE — Progress Notes (Signed)
Progress Note  Patient Name: William [name] Date of Encounter: 03/11/2022  Primary Cardiologist: None  Subjective   No acute events overnight. No chest pain or SOB. Telemetry reviewed, he has paroxysms of possible atrial tachycardia. He also did have 1 episode of nonsustained ventricular tachycardia on 03/09/2022 in PM.  Inpatient Medications    Scheduled Meds:  sodium chloride   Intravenous Once   aspirin  81 mg Oral Daily   atorvastatin  20 mg Oral Daily   carvedilol  3.125 mg Oral BID WC   Chlorhexidine Gluconate Cloth  6 each Topical Daily   darbepoetin (ARANESP) injection - NON-DIALYSIS  200 mcg Subcutaneous Q Mon-1800   gentamicin cream  1 Application Topical Daily   guaiFENesin  600 mg Oral BID   heparin injection (subcutaneous)  5,000 Units Subcutaneous Q8H   insulin aspart  1-3 Units Subcutaneous Q4H   insulin detemir  5 Units Subcutaneous Q12H   melatonin  3 mg Oral QHS   pantoprazole  40 mg Oral Daily   potassium chloride  40 mEq Oral BID   QUEtiapine  50 mg Oral QHS   sevelamer carbonate  800 mg Oral TID WC   sodium chloride flush  10-40 mL Intracatheter Q12H   Continuous Infusions:  sodium chloride Stopped (03/04/22 1031)   dialysis solution 2.5% low-MG/low-CA     PRN Meds: sodium chloride, acetaminophen **OR** acetaminophen, calcium carbonate, guaiFENesin-dextromethorphan, haloperidol lactate, ipratropium-albuterol, metoprolol tartrate, mouth rinse, [COMPLETED] polyethylene glycol **FOLLOWED BY** polyethylene glycol, prochlorperazine, sodium chloride flush, white petrolatum   Vital Signs    Vitals:   03/11/22 0000 03/11/22 0639 03/11/22 0759 03/11/22 0800  BP: 122/65 129/75 100/65 110/69  Pulse: 72 (!) 104 75 76  Resp:   18   Temp: 98.2 F (36.8 C)  97.8 F (36.6 C)   TempSrc: Oral  Oral   SpO2: 96% 94% 97% 97%  Weight:      Height:        Intake/Output Summary (Last 24 hours) at 03/11/2022 1103 Last data filed at 03/11/2022 0759 Gross per 24 hour   Intake 600 ml  Output 2595 ml  Net -1995 ml   Filed Weights   03/07/22 1728 03/08/22 0657 03/09/22 0733  Weight: 83.9 kg 84.2 kg 83.6 kg    Telemetry     Personally reviewed, HR controlled but he does have paroxysms of possible atrial tachycardia.  ECG    Diffuse ST segment depressions except in V1 and aVR where there is ST elevation  Physical Exam   GEN: No acute distress.   Neck: No JVD. Cardiac: RRR, no murmur, rub, or gallop.  Respiratory: Nonlabored. Clear to auscultation bilaterally. GI: Soft, nontender, bowel sounds present. MS: No edema; No deformity. Neuro:  Nonfocal. Psych: Alert and oriented x 3. Normal affect.  Labs    Chemistry Recent Labs  Lab 03/08/22 1520 03/09/22 0731 03/10/22 0057 03/11/22 0052  NA 135 140 139 140  K 3.7 3.5 3.2* 3.1*  CL 96* 102 101 100  CO2 21* 21* 23 24  GLUCOSE 129* 112* 146* 140*  BUN 76* 73* 72* 64*  CREATININE 6.89* 6.70* 7.15* 6.68*  CALCIUM 8.3* 8.5* 8.7* 8.8*  PROT 6.1*  --   --   --   ALBUMIN 2.8*  --   --  2.6*  AST 32  --   --   --   ALT 31  --   --   --   ALKPHOS 49  --   --   --  BILITOT 0.6  --   --   --   GFRNONAA 8* 8* 7* 8*  ANIONGAP 18* 17* 15 16*     Hematology Recent Labs  Lab 03/09/22 0731 03/10/22 0057 03/11/22 0052  WBC 13.2* 12.8* 12.6*  RBC 2.92* 3.13* 3.33*  HGB 9.6* 10.5* 11.1*  HCT 29.5* 30.8* 33.3*  MCV 101.0* 98.4 100.0  MCH 32.9 33.5 33.3  MCHC 32.5 34.1 33.3  RDW 13.6 13.3 13.1  PLT 303 340 318    Cardiac Enzymes Recent Labs  Lab 03/08/22 0553 03/08/22 0756 03/08/22 0917 03/08/22 1520 03/08/22 1850  TROPONINIHS 2,478* 2,695* 2,560* 1,534* 1,415*    BNPNo results for input(s): "BNP", "PROBNP" in the last 168 hours.   DDimerNo results for input(s): "DDIMER" in the last 168 hours.   Radiology    No results found.  Cardiac Studies  Echo from 03/04/2022 LVEF 40 to 81% Grade 1 diastolic dysfunction RV systolic function is mildly reduced Mild MR Mild  AI   Assessment & Plan    Patient is 82 year old M known to have ESRD on PD, HTN, hypothyroidism, HLD, mild MR presented with acute hypoxic respiratory failure secondary to significant pneumonia and NSTEMI.  # NSTEMI, likely type II in the setting of pneumonia but type I cannot be ruled out completely -Patient's troponins initially were 800 - 900s but peaked to 2695 during this hospitalization. EKG showed diffuse ST segment depressions except in aVR and V1 that has ST elevation. Echo showed LVEF 40 to 45% with no RWMA. Patient denied having chest pain or SOB but did have SOB prior to admission possibly from pneumonia. He completed antibiotic course for pneumonia and his confusion resolved. Hemoglobin has been stable and no bleeding episodes ever in his life. Patient will benefit from invasive ischemia evaluation with LHC prior to discharge.Risks and benefits of cardiac catheterization have been discussed with the patient.  These include bleeding, infection, kidney damage, stroke, heart attack, death. The patient understands these risks and is willing to proceed. -Patient is posted for [facility] on 03/12/2022. Keep n.p.o. after midnight.  CBC, CMP, INR in AM.  He is DNR but agreed to convert his CODE STATUS to full code for the LHC procedure. -Continue aspirin 81 mg once daily -Continue atorvastatin 20 mg nightly -Switch carvedilol to metoprolol 25 mg twice daily  # Brief paroxysms of possible atrial tachycardia/SVT -Switch carvedilol to metoprolol 25 mg twice daily -2-week event monitor upon discharge -OSA evaluation outpatient  # Acute hypoxic respiratory failure secondary to pneumonia, extubated on 03/05/2022: Patient pleated antibiotic course for pneumonia.  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  Patient did not have any bleeding episodes in his life. Hemoglobin was 7.9 in the current admission (which could be secondary to sepsis and anemia of chronic disease) for  which he received PRBC transfusion. Hemoglobin stable today at 11.1.  # HFrEF, LVEF 40 to 45%, rule out CAD: Switch carvedilol to metoprolol 25 mg twice daily  # HLD: Not on therapy prior to admission.  LDL 122 in 01/2022.  Repeat lipid panel with LDL at 94.  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 Atorvastatin 20 mg nightly is initiated during this hospitalization.  I have spent a total of 31 minutes with patient reviewing chart , telemetry, EKGs, labs and examining  patient as well as establishing an assessment and plan that was discussed with the patient.  > 50% of time was spent in direct patient care.     Signed, Chalmers Guest, MD  03/11/2022, 11:03 AM

## 2022-03-11 NOTE — Progress Notes (Addendum)
Nephrology Progress Note:  OP PD: f/b Dr Dimas Aguas from [facility]. 4 exchanges overnight, 2500 cc dwell vol, uses mostly 1.5% and 2.5% bags.  On PD for 3 years. Dry wt 181- 185  lbs per the pt.    Assessment/ Plan: Pt is a 82 y.o. yo male with ESRD on PD who was admitted on 03/02/2022 with PNA but also felt to have NSTEMI  1. Right sided community acquired PNA-  extubated.  Completed antibiotics   2. ESRD - on PD at home.   - Continue PD here - increased fluid removal goal and now euvolemic.  Tube in position on recent assessment  - If he's still here tonight will change to all 2.5 % dextrose bags.  We have increased to 5 cycles   -> another 2.6L net UF overnight w/ CCPD. - await AM labs - Miralax once daily PRN -volume status improved  3.Hypokalemia - Kcl ordered  again; increased to 66mq BID. May need to adjust  4. Acute hypoxemic respiratory failure  - secondary to PNA; also rule out any component of overload by optimizing volume removal with PD  5. Anemia of CKD -  he is ordered aranesp 200 mcg weekly on Mondays and got the last dose.  Hb improving.  Follow trends and would reduce next dose if able   6. Secondary hyperparathyroidism-  hyperphosphatemia however he states he has never been on a binder and that phos is usually controlled.  Have started renvela for now  7. HTN -  controlled  8. NSTEMI- per cardiology and primary team; bradycardia resolved  Disposition -  per primary team   Subjective:    Seen in room. PD completed - 2.6  mL UF. He feels good. Denies cp/dyspnea.   PD UF:  1/6 2600 mL UF 1/5 -  2075 mL UF 1/4 am - 183 mL UF per machine (2.5%) 1/3 am - gain of 536 mL (reflects 1.5% bags) He had 523 mL UF charted for 1/2 am with PD (reflecting PD over 1/1-1/2 evening).   Objective Vital signs in last 24 hours: Vitals:   03/10/22 2006 03/11/22 0000 03/11/22 0639 03/11/22 0759  BP: (!) 129/95 122/65 129/75 100/65  Pulse: (!) 115 72 (!) 104 75  Resp:    18   Temp: 98.5 F (36.9 C) 98.2 F (36.8 C)  97.8 F (36.6 C)  TempSrc: Oral Oral  Oral  SpO2: 95% 96% 94% 97%  Weight:      Height:       Weight change:   Intake/Output Summary (Last 24 hours) at 03/11/2022 0854 Last data filed at 03/11/2022 0759 Gross per 24 hour  Intake 843 ml  Output 2596 ml  Net -1753 ml     Labs: Basic Metabolic Panel: Recent Labs  Lab 03/08/22 0553 03/08/22 1520 03/09/22 0731 03/10/22 0057 03/11/22 0052  NA 135 135 140 139 140  K 3.5 3.7 3.5 3.2* 3.1*  CL 98 96* 102 101 100  CO2 22 21* 21* 23 24  GLUCOSE 111* 129* 112* 146* 140*  BUN 71* 76* 73* 72* 64*  CREATININE 6.51* 6.89* 6.70* 7.15* 6.68*  CALCIUM 7.9* 8.3* 8.5* 8.7* 8.8*  PHOS 7.1* 6.9*  --   --  5.0*   Liver Function Tests: Recent Labs  Lab 03/08/22 1520 03/11/22 0052  AST 32  --   ALT 31  --   ALKPHOS 49  --   BILITOT 0.6  --   PROT 6.1*  --  ALBUMIN 2.8* 2.6*   No results for input(s): "LIPASE", "AMYLASE" in the last 168 hours.  No results for input(s): "AMMONIA" in the last 168 hours. CBC: Recent Labs  Lab 03/08/22 0553 03/08/22 1520 03/09/22 0731 03/10/22 0057 03/11/22 0052  WBC 10.3 14.5* 13.2* 12.8* 12.6*  NEUTROABS  --   --   --  10.7*  --   HGB 7.9* 9.8* 9.6* 10.5* 11.1*  HCT 22.9* 29.0* 29.5* 30.8* 33.3*  MCV 100.0 100.3* 101.0* 98.4 100.0  PLT 273 346 303 340 318   Cardiac Enzymes: No results for input(s): "CKTOTAL", "CKMB", "CKMBINDEX", "TROPONINI" in the last 168 hours. CBG: Recent Labs  Lab 03/10/22 1143 03/10/22 1601 03/10/22 2004 03/10/22 2352 03/11/22 0351  GLUCAP 104* 132* 199* 157* 153*    Iron Studies: No results for input(s): "IRON", "TIBC", "TRANSFERRIN", "FERRITIN" in the last 72 hours. Studies/Results: No results found. Medications: Infusions:  sodium chloride Stopped (03/04/22 1031)   dialysis solution 2.5% low-MG/low-CA     dialysis solution 4.25% low-MG/low-CA      Scheduled Medications:  sodium chloride   Intravenous Once    aspirin  81 mg Oral Daily   atorvastatin  20 mg Oral Daily   carvedilol  3.125 mg Oral BID WC   Chlorhexidine Gluconate Cloth  6 each Topical Daily   darbepoetin (ARANESP) injection - NON-DIALYSIS  200 mcg Subcutaneous Q Mon-1800   gentamicin cream  1 Application Topical Daily   guaiFENesin  600 mg Oral BID   heparin injection (subcutaneous)  5,000 Units Subcutaneous Q8H   insulin aspart  1-3 Units Subcutaneous Q4H   insulin detemir  5 Units Subcutaneous Q12H   melatonin  3 mg Oral QHS   pantoprazole  40 mg Oral Daily   potassium chloride  40 mEq Oral Once   QUEtiapine  50 mg Oral QHS   sevelamer carbonate  800 mg Oral TID WC   sodium chloride flush  10-40 mL Intracatheter Q12H    have reviewed scheduled and prn medications.  Physical Exam:    General adult male in bed in no acute distress HEENT normocephalic atraumatic extraocular movements intact sclera anicteric Neck supple trachea midline Lungs clear bilaterally, normal work of breathing on room air Heart S1S2 no rub Abdomen soft nontender distended; PD catheter in place and dressed Extremities no edema appreciated; no cyanosis Psych no current agitation  Neuro - alert, following commands

## 2022-03-11 NOTE — Progress Notes (Signed)
PROGRESS NOTE  William [name] UYQ:[id number] DOB: 31-Jan-1941   PCP: [name], PA-C  Patient is from: Home.  DOA: 03/02/2022 LOS: 8  Chief complaints Chief Complaint  Patient presents with   Emesis   Shortness of Breath     Brief Narrative / Interim history: 82 year old man with hx of combined CHF, ESRD on PD, HTN, HLD and BPH presenting with with productive cough, dyspnea on exertion and subjective fever and admitted to ICU for acute respiratory failure with hypoxia in the setting of multifocal pneumonia.  He was intubated from 12/29-1/1.  Started on IV ceftriaxone and azithromycin on 12/29, and vancomycin on 12/30.  He was transferred out of ICU on 1/2 but developed increasing agitation, confusion the night of 1/3 and started on Precedex drip and sent back to ICU.  EKG on 1/4 with increased ST/T changes in inferolateral leads.  He also had significantly elevated troponin, 800>>> 2700 (peak).  TTE with LVEF of 40 to 45% and G1 DD.  Cardiology consulted.   Eventually, delirium improved, came off Precedex drip and transferred to [facility] hospitalist service on 03/10/2021.  Completed antibiotic course on 03/10/2021.  Respiratory failure resolved.  Cardiology and nephrology following.  Cardiology planning [facility] on 1/8.  Subjective: Seen and examined earlier this morning.  No major events overnight of this morning.  Was not able to sleep.  Had an episode of emesis after coughing and gagging.  Denies chest pain or shortness of breath.  Reports cough with phlegm.  Objective: Vitals:   03/11/22 0759 03/11/22 0800 03/11/22 1116 03/11/22 1200  BP: 100/65 110/69 116/70 127/74  Pulse: 75 76  80  Resp: 18  20   Temp: 97.8 F (36.6 C)  97.9 F (36.6 C)   TempSrc: Oral  Oral   SpO2: 97% 97%  96%  Weight:      Height:        Examination:  GENERAL: No apparent distress.  Nontoxic. HEENT: MMM.  Vision and hearing grossly intact.  NECK: Supple.  No apparent JVD.  RESP:  No IWOB.   Fair aeration bilaterally. CVS:  RRR. Heart sounds normal.  ABD/GI/GU: BS+. Abd soft, NTND.  MSK/EXT:  Moves extremities. No apparent deformity. No edema.  SKIN: no apparent skin lesion or wound NEURO: Sleepy but wakes to voice.  Oriented appropriately.  No apparent focal neuro deficit. PSYCH: Calm. Normal affect.   Procedures:  12/29-1/1 intubation and mechanical ventilation  Microbiology summarized: COVID-19, influenza and RSV PCR nonreactive. MRSA PCR screen negative. Blood cultures NGTD  Assessment and plan: Principal Problem:   Acute respiratory failure with hypoxia (HCC) Active Problems:   Essential hypertension   ESRD (end stage renal disease) (HCC)   Multifocal pneumonia   Anemia in chronic kidney disease (CKD)   Acute metabolic acidosis   Delirium   Acute on chronic combined systolic and diastolic CHF (congestive heart failure) (HCC)   Non-STEMI (non-ST elevated myocardial infarction) ([place])  Acute hypoxemic respiratory failure due to multifocal pneumonia: CT chest, abdomen and pelvis with patchy airspace disease in both lungs suggesting multifocal pneumonia, moderate right effusion and prominent mediastinal lymph nodes.  Culture data as above.  Respiratory failure resolved.  Completed antibiotic and steroid course. -Pulmonary toilet with incentive spirometry -Mucolytic's and antitussives.  Agitation/delirium/insomnia: Seems to have resolved.  Required Precedex while in ICU -Continue p.o. Seroquel 50 mg nightly -IV Haldol 1 mg every 6 hours as needed -Monitor QTc, 458 on 1/5. -Optimize electrolytes   Non-STEMI: EKG EKG  showed diffuse ST segment depressions except in aVR and V1 that has ST elevation. Echo showed LVEF 40 to 45%. Patient denied any chest pain or SOB.  Troponin trended from [phone] (peak).  Patient without chest pain or shortness of breath at the moment. -Continue low-dose aspirin, Lipitor and Coreg -Plan for [facility] on 1/8. -N.p.o. after midnight  Acute  combined CHF: TTE with LVEF of 40 to 45% and G1 DD.  Appears euvolemic as of now. -Fluid management by peritoneal dialysis per nephrology.  ESRD on peritoneal dialysis. Bone mineral disorder/hyperphosphatemia/hypocalcemia/hyponatremia -Peritoneal dialysis per nephrology  Anemia of renal disease: Transfused 1 unit on 1/4 with appropriate response.  No signs of bleeding.  H&H stable and better than baseline. Recent Labs    03/04/22 0220 03/04/22 1116 03/05/22 0416 03/06/22 0313 03/07/22 0043 03/08/22 0553 03/08/22 1520 03/09/22 0731 03/10/22 0057 03/11/22 0052  HGB 8.3* 9.5* 8.0* 8.5* 9.6* 7.9* 9.8* 9.6* 10.5* 11.1*  -Continue monitoring  Essential hypertension: Normotensive. -Cardiac meds as above  Generalized weakness/physical deconditioning -PT/OT-recommended HH PT/OT.  Hypokalemia: K3.1. -P.o. K CL 40 x 1  Constipation: Resolved. -Bowel regimen as needed  Coughing/gagging while eating -SLP eval -Continue p.o. Protonix  Body mass index is 25 kg/m.          DVT prophylaxis:  heparin injection 5,000 Units Start: 03/04/22 1500  Code Status: DNR/DNI Family Communication: Updated patient's daughter at bedside. Level of care: Telemetry Cardiac Status is: Inpatient Remains inpatient appropriate because: Non-STEMI.  Plan for [place] on 1/8   Final disposition: Home with home health once cleared by cardiology Consultants:  Pulmonology admitted patient Nephrology Cardiology  Sch Meds:  Scheduled Meds:  sodium chloride   Intravenous Once   aspirin  81 mg Oral Daily   atorvastatin  20 mg Oral Daily   carvedilol  3.125 mg Oral BID WC   Chlorhexidine Gluconate Cloth  6 each Topical Daily   darbepoetin (ARANESP) injection - NON-DIALYSIS  200 mcg Subcutaneous Q Mon-1800   gentamicin cream  1 Application Topical Daily   guaiFENesin  600 mg Oral BID   heparin injection (subcutaneous)  5,000 Units Subcutaneous Q8H   insulin aspart  1-3 Units Subcutaneous Q4H    insulin detemir  5 Units Subcutaneous Q12H   melatonin  3 mg Oral QHS   pantoprazole  40 mg Oral Daily   potassium chloride  40 mEq Oral BID   QUEtiapine  50 mg Oral QHS   sevelamer carbonate  800 mg Oral TID WC   sodium chloride flush  10-40 mL Intracatheter Q12H   Continuous Infusions:  sodium chloride Stopped (03/04/22 1031)   dialysis solution 2.5% low-MG/low-CA     PRN Meds:.sodium chloride, acetaminophen **OR** acetaminophen, calcium carbonate, guaiFENesin-dextromethorphan, haloperidol lactate, ipratropium-albuterol, metoprolol tartrate, mouth rinse, [COMPLETED] polyethylene glycol **FOLLOWED BY** polyethylene glycol, prochlorperazine, sodium chloride flush, white petrolatum  Antimicrobials: Anti-infectives (From admission, onward)    Start     Dose/Rate Route Frequency Ordered Stop   03/03/22 2200  cefTRIAXone (ROCEPHIN) 2 g in sodium chloride 0.9 % 100 mL IVPB        2 g 200 mL/hr over 30 Minutes Intravenous Every 24 hours 03/03/22 1546 03/10/22 0028   03/03/22 2000  azithromycin (ZITHROMAX) 500 mg in sodium chloride 0.9 % 250 mL IVPB        500 mg 250 mL/hr over 60 Minutes Intravenous Every 24 hours 03/03/22 1546 03/06/22 2253   03/03/22 1700  vancomycin (VANCOREADY) IVPB 2000 mg/400 mL  2,000 mg 200 mL/hr over 120 Minutes Intravenous NOW 03/03/22 1608 03/04/22 1011   03/03/22 1611  vancomycin variable dose per unstable renal function (pharmacist dosing)  Status:  Discontinued         Does not apply See admin instructions 03/03/22 1611 03/06/22 0838   03/02/22 2300  cefTRIAXone (ROCEPHIN) 1 g in sodium chloride 0.9 % 100 mL IVPB        1 g 200 mL/hr over 30 Minutes Intravenous  Once 03/02/22 2245 03/02/22 2345   03/02/22 2300  azithromycin (ZITHROMAX) 500 mg in sodium chloride 0.9 % 250 mL IVPB        500 mg 250 mL/hr over 60 Minutes Intravenous  Once 03/02/22 2245 03/03/22 0108        I have personally reviewed the following labs and images: CBC: Recent Labs   Lab 03/08/22 0553 03/08/22 1520 03/09/22 0731 03/10/22 0057 03/11/22 0052  WBC 10.3 14.5* 13.2* 12.8* 12.6*  NEUTROABS  --   --   --  10.7*  --   HGB 7.9* 9.8* 9.6* 10.5* 11.1*  HCT 22.9* 29.0* 29.5* 30.8* 33.3*  MCV 100.0 100.3* 101.0* 98.4 100.0  PLT 273 346 303 340 318   BMP &GFR Recent Labs  Lab 03/06/22 0313 03/07/22 0043 03/08/22 0553 03/08/22 1520 03/09/22 0731 03/10/22 0057 03/11/22 0052  NA 139 138 135 135 140 139 140  K 3.7 3.6 3.5 3.7 3.5 3.2* 3.1*  CL 99 98 98 96* 102 101 100  CO2 21* 21* 22 21* 21* 23 24  GLUCOSE 129* 164* 111* 129* 112* 146* 140*  BUN 71* 73* 71* 76* 73* 72* 64*  CREATININE 6.67* 6.74* 6.51* 6.89* 6.70* 7.15* 6.68*  CALCIUM 8.4* 8.4* 7.9* 8.3* 8.5* 8.7* 8.8*  MG 2.4 2.2 2.2 2.2  --   --  2.1  PHOS 7.6* 7.9* 7.1* 6.9*  --   --  5.0*   Estimated Creatinine Clearance: 9.7 mL/min (A) (by C-G formula based on SCr of 6.68 mg/dL (H)). Liver & Pancreas: Recent Labs  Lab 03/08/22 1520 03/11/22 0052  AST 32  --   ALT 31  --   ALKPHOS 49  --   BILITOT 0.6  --   PROT 6.1*  --   ALBUMIN 2.8* 2.6*   No results for input(s): "LIPASE", "AMYLASE" in the last 168 hours. No results for input(s): "AMMONIA" in the last 168 hours. Diabetic: No results for input(s): "HGBA1C" in the last 72 hours. Recent Labs  Lab 03/10/22 1601 03/10/22 2004 03/10/22 2352 03/11/22 0351 03/11/22 1115  GLUCAP 132* 199* 157* 153* 146*   Cardiac Enzymes: No results for input(s): "CKTOTAL", "CKMB", "CKMBINDEX", "TROPONINI" in the last 168 hours. No results for input(s): "PROBNP" in the last 8760 hours. Coagulation Profile: No results for input(s): "INR", "PROTIME" in the last 168 hours. Thyroid Function Tests: No results for input(s): "TSH", "T4TOTAL", "FREET4", "T3FREE", "THYROIDAB" in the last 72 hours. Lipid Profile: No results for input(s): "CHOL", "HDL", "LDLCALC", "TRIG", "CHOLHDL", "LDLDIRECT" in the last 72 hours. Anemia Panel: No results for input(s):  "VITAMINB12", "FOLATE", "FERRITIN", "TIBC", "IRON", "RETICCTPCT" in the last 72 hours. Urine analysis:    Component Value Date/Time   COLORURINE YELLOW 12/30/2014 1820   APPEARANCEUR CLOUDY (A) 12/30/2014 1820   LABSPEC 1.013 12/30/2014 1820   PHURINE 5.5 12/30/2014 1820   GLUCOSEU NEGATIVE 12/30/2014 1820   HGBUR TRACE (A) 12/30/2014 1820   BILIRUBINUR NEGATIVE 12/30/2014 1820   KETONESUR NEGATIVE 12/30/2014 1820   PROTEINUR 100 (A)  12/30/2014 1820   UROBILINOGEN 0.2 12/30/2014 1820   NITRITE NEGATIVE 12/30/2014 1820   LEUKOCYTESUR LARGE (A) 12/30/2014 1820   Sepsis Labs: Invalid input(s): "PROCALCITONIN", "LACTICIDVEN"  Microbiology: Recent Results (from the past 240 hour(s))  Resp panel by RT-PCR (RSV, Flu A&B, Covid) Anterior Nasal Swab     Status: None   Collection Time: 03/02/22  9:19 PM   Specimen: Anterior Nasal Swab  Result Value Ref Range Status   SARS Coronavirus 2 by RT PCR NEGATIVE NEGATIVE Final    Comment: (NOTE) SARS-CoV-2 target nucleic acids are NOT DETECTED.  The SARS-CoV-2 RNA is generally detectable in upper respiratory specimens during the acute phase of infection. The lowest concentration of SARS-CoV-2 viral copies this assay can detect is 138 copies/mL. A negative result does not preclude SARS-Cov-2 infection and should not be used as the sole basis for treatment or other patient management decisions. A negative result may occur with  improper specimen collection/handling, submission of specimen other than nasopharyngeal swab, presence of viral mutation(s) within the areas targeted by this assay, and inadequate number of viral copies(<138 copies/mL). A negative result must be combined with clinical observations, patient history, and epidemiological information. The expected result is Negative.  Fact Sheet for Patients:  EntrepreneurPulse.com.au  Fact Sheet for Healthcare Providers:   IncredibleEmployment.be  This test is no t yet approved or cleared by the [place] FDA and  has been authorized for detection and/or diagnosis of SARS-CoV-2 by FDA under an Emergency Use Authorization (EUA). This EUA will remain  in effect (meaning this test can be used) for the duration of the COVID-19 declaration under Section 564(b)(1) of the Act, 21 U.S.C.section 360bbb-3(b)(1), unless the authorization is terminated  or revoked sooner.       Influenza A by PCR NEGATIVE NEGATIVE Final   Influenza B by PCR NEGATIVE NEGATIVE Final    Comment: (NOTE) The Xpert Xpress SARS-CoV-2/FLU/RSV plus assay is intended as an aid in the diagnosis of influenza from Nasopharyngeal swab specimens and should not be used as a sole basis for treatment. Nasal washings and aspirates are unacceptable for Xpert Xpress SARS-CoV-2/FLU/RSV testing.  Fact Sheet for Patients: EntrepreneurPulse.com.au  Fact Sheet for Healthcare Providers: IncredibleEmployment.be  This test is not yet approved or cleared by the [place] FDA and has been authorized for detection and/or diagnosis of SARS-CoV-2 by FDA under an Emergency Use Authorization (EUA). This EUA will remain in effect (meaning this test can be used) for the duration of the COVID-19 declaration under Section 564(b)(1) of the Act, 21 U.S.C. section 360bbb-3(b)(1), unless the authorization is terminated or revoked.     Resp Syncytial Virus by PCR NEGATIVE NEGATIVE Final    Comment: (NOTE) Fact Sheet for Patients: EntrepreneurPulse.com.au  Fact Sheet for Healthcare Providers: IncredibleEmployment.be  This test is not yet approved or cleared by the [place] FDA and has been authorized for detection and/or diagnosis of SARS-CoV-2 by FDA under an Emergency Use Authorization (EUA). This EUA will remain in effect (meaning this test can be used) for  the duration of the COVID-19 declaration under Section 564(b)(1) of the Act, 21 U.S.C. section 360bbb-3(b)(1), unless the authorization is terminated or revoked.  Performed at [facility], [address] [place], [place] [address]   Blood culture (routine x 2)     Status: None   Collection Time: 03/02/22 10:50 PM   Specimen: BLOOD  Result Value Ref Range Status   Specimen Description   Final    BLOOD  RIGHT ANTECUBITAL Performed at [facility], [address] [place], [place] [address]    Special Requests   Final    BOTTLES DRAWN AEROBIC AND ANAEROBIC Blood Culture adequate volume Performed at [facility], [address] [place], [place] [address]    Culture   Final    NO GROWTH 5 DAYS Performed at [facility], [place] [address], [place], [place] [address]    Report Status 03/08/2022 FINAL  Final  Blood culture (routine x 2)     Status: None   Collection Time: 03/02/22 10:54 PM   Specimen: BLOOD LEFT WRIST  Result Value Ref Range Status   Specimen Description   Final    BLOOD LEFT WRIST Performed at [facility], [address] [place], [place] [address]    Special Requests   Final    BOTTLES DRAWN AEROBIC AND ANAEROBIC Blood Culture adequate volume Performed at [facility], [address] [place], [place] [address]    Culture   Final    NO GROWTH 5 DAYS Performed at [facility], [place] [address] [place], [place] [address]    Report Status 03/08/2022 FINAL  Final  MRSA Next Gen by PCR, Nasal     Status: None   Collection Time: 03/03/22  4:28 PM   Specimen: Nasal Mucosa; Nasal Swab  Result Value Ref Range Status   MRSA by PCR Next Gen NOT DETECTED NOT DETECTED Final    Comment: (NOTE) The GeneXpert MRSA Assay (FDA approved for NASAL specimens only), is one component of a comprehensive MRSA colonization surveillance program. It is not intended to diagnose MRSA infection nor to guide or monitor  treatment for MRSA infections. Test performance is not FDA approved in patients less than 28 years old. Performed at [facility], [place] [address], [place], [place] [address]   Respiratory (~20 pathogens) panel by PCR     Status: None   Collection Time: 03/03/22  4:31 PM   Specimen: Nasopharyngeal Swab; Respiratory  Result Value Ref Range Status   Adenovirus NOT DETECTED NOT DETECTED Final   Coronavirus 229E NOT DETECTED NOT DETECTED Final    Comment: (NOTE) The Coronavirus on the Respiratory Panel, DOES NOT test for the novel  Coronavirus (2019 nCoV)    Coronavirus HKU1 NOT DETECTED NOT DETECTED Final   Coronavirus NL63 NOT DETECTED NOT DETECTED Final   Coronavirus OC43 NOT DETECTED NOT DETECTED Final   Metapneumovirus NOT DETECTED NOT DETECTED Final   Rhinovirus / Enterovirus NOT DETECTED NOT DETECTED Final   Influenza A NOT DETECTED NOT DETECTED Final   Influenza B NOT DETECTED NOT DETECTED Final   Parainfluenza Virus 1 NOT DETECTED NOT DETECTED Final   Parainfluenza Virus 2 NOT DETECTED NOT DETECTED Final   Parainfluenza Virus 3 NOT DETECTED NOT DETECTED Final   Parainfluenza Virus 4 NOT DETECTED NOT DETECTED Final   Respiratory Syncytial Virus NOT DETECTED NOT DETECTED Final   Bordetella pertussis NOT DETECTED NOT DETECTED Final   Bordetella Parapertussis NOT DETECTED NOT DETECTED Final   Chlamydophila pneumoniae NOT DETECTED NOT DETECTED Final   Mycoplasma pneumoniae NOT DETECTED NOT DETECTED Final    Comment: Performed at [facility], [place] [place], [place] [address]  Culture, Respiratory w Gram Stain     Status: None   Collection Time: 03/03/22  6:04 PM   Specimen: Bronchoalveolar Lavage; Respiratory  Result Value Ref Range Status  Specimen Description BRONCHIAL ALVEOLAR LAVAGE  Final   Special Requests NONE  Final   Gram Stain RARE YEAST NO WBC SEEN   Final   Culture   Final    RARE Normal respiratory flora-no Staph aureus or Pseudomonas  seen Performed at [facility], [place] [address], [place], [place] [address]    Report Status 03/06/2022 FINAL  Final  MRSA Next Gen by PCR, Nasal     Status: None   Collection Time: 03/07/22  4:04 AM   Specimen: Nasal Mucosa; Nasal Swab  Result Value Ref Range Status   MRSA by PCR Next Gen NOT DETECTED NOT DETECTED Final    Comment: (NOTE) The GeneXpert MRSA Assay (FDA approved for NASAL specimens only), is one component of a comprehensive MRSA colonization surveillance program. It is not intended to diagnose MRSA infection nor to guide or monitor treatment for MRSA infections. Test performance is not FDA approved in patients less than 76 years old. Performed at [facility], [place] [address], [place], [place] [address]     Radiology Studies: No results found.    Jleigh Striplin T. [place]  If 7PM-7AM, please contact night-coverage [email or web address] 03/11/2022, 12:55 PM

## 2022-03-11 NOTE — Significant Event (Signed)
Patient up to the chair for lunch with poor appetite, while eating patient started to cough and gag.

## 2022-03-11 NOTE — Evaluation (Signed)
Clinical/Bedside Swallow Evaluation Patient Details  Name: William [name] MRN: [id number] Date of Birth: 1940/03/25  Today's Date: 03/11/2022 Time: SLP Start Time (ACUTE ONLY): 55 SLP Stop Time (ACUTE ONLY): 1430 SLP Time Calculation (min) (ACUTE ONLY): 14 min  Past Medical History:  Past Medical History:  Diagnosis Date   Aortic insufficiency    ESRD on peritoneal dialysis ([place])    Hyperlipidemia    Hypertension    Past Surgical History:  Past Surgical History:  Procedure Laterality Date   APPENDECTOMY     CARDIOVASCULAR STRESS TEST  06/08/2009   EF 43%   CHOLECYSTECTOMY     FOOT SURGERY     HERNIA REPAIR     US ECHOCARDIOGRAPHY  05/24/2009   EF 60%   HPI:  82 year old man with hx of combined CHF, ESRD on PD, HTN, HLD and BPH presenting with with productive cough, dyspnea on exertion and subjective fever and admitted to ICU for acute respiratory failure with hypoxia in the setting of multifocal pneumonia. Respiratory failure has resolved; however, pt with ongoing n/v after intake.    Assessment / Plan / Recommendation  Clinical Impression  Mr. William [name] was seen for bedside swallow evaluation after a coughing/gagging incident earlier today, witnessed by RN. Pt/wife report he has been experiencing n/v after meals at least 2-3x/wk. He reports it is rarely food, but is generally bile, saliva, water. He does have PMH of acute respiratory failure with hypoxia with multifocal PNA. He reports poor appetite and GERD, which he treats medically with Protonix BID and has for years. He denies any breakthrough GERD symptoms, such as food lodging in chest, but does endorse early satiety and regurgitation. No s/s aspiration with thin liquids, applesauce, or graham cracker. No reports of nausea.  At this time, recommend continuation of present diet (thin liquids, regular consistency solids) with plans for modified barium swallow to r/o aspiration as cause of respiratory problems and impact of  esophageal phase on pharyngeal phase.  SLP Visit Diagnosis: Dysphagia, unspecified (R13.10)    Aspiration Risk  Mild aspiration risk    Diet Recommendation Regular;Thin liquid   Medication Administration: Whole meds with liquid Supervision: Patient able to self feed Compensations: Small sips/bites;Slow rate Postural Changes: Seated upright at 90 degrees;Remain upright for at least 30 minutes after po intake    Other  Recommendations Recommended Consults: Consider GI evaluation;Consider esophageal assessment Oral Care Recommendations: Oral care QID    Recommendations for follow up therapy are one component of a multi-disciplinary discharge planning process, led by the attending physician.  Recommendations may be updated based on patient status, additional functional criteria and insurance authorization.  Follow up Recommendations Other (comment) (TBD)      Functional Status Assessment Patient has had a recent decline in their functional status and demonstrates the ability to make significant improvements in function in a reasonable and predictable amount of time.  Frequency and Duration min 2x/week  1 week       Prognosis Prognosis for Safe Diet Advancement: Fair Barriers to Reach Goals: Severity of deficits      Swallow Study   General Date of Onset: 03/11/22 HPI: 82 year old man with hx of combined CHF, ESRD on PD, HTN, HLD and BPH presenting with with productive cough, dyspnea on exertion and subjective fever and admitted to ICU for acute respiratory failure with hypoxia in the setting of multifocal pneumonia. Respiratory failure has resolved; however, pt with ongoing n/v after intake. Type of Study: Bedside Swallow Evaluation Previous  Swallow Assessment: none Diet Prior to this Study: Regular;Thin liquids Temperature Spikes Noted: No Respiratory Status: Room air History of Recent Intubation: No Behavior/Cognition: Alert;Cooperative;Pleasant mood Oral Cavity Assessment:  Within Functional Limits Oral Care Completed by SLP: No Oral Cavity - Dentition: Adequate natural dentition Vision: Functional for self-feeding Self-Feeding Abilities: Able to feed self Patient Positioning: Upright in bed Baseline Vocal Quality: Normal Volitional Cough: Strong Volitional Swallow: Able to elicit    Oral/Motor/Sensory Function Overall Oral Motor/Sensory Function: Within functional limits   Ice Chips Ice chips: Within functional limits Presentation: Spoon   Thin Liquid Thin Liquid: Within functional limits Presentation: Straw;Cup    Puree Puree: Within functional limits Presentation: Spoon   Solid     Solid: Within functional limits Presentation: [place]. William [name], M.S., CCC-SLP Speech-Language Pathologist Acute Rehabilitation Services Pager: [place] 03/11/2022,2:50 PM

## 2022-03-11 NOTE — Progress Notes (Signed)
PD post treatment note:   PD treatment completed. Patient tolerated treatment well. PD effluent is clear. No specimen collected.  PD exit site clean, dry and intact. Patient is awake, oriented and in no acute distress.  Report given to bedside nurse.      03/11/22 0750  Peritoneal Catheter Right lower abdomen  No placement date or time found.   Catheter Location: Right lower abdomen  Site Assessment Clean, Dry, Intact  Drainage Description None  Catheter status Deaccessed  Dressing Gauze/Drain sponge  Dressing Status Clean, Dry, Intact  Dressing Intervention Dressing reinforced  Completion  Treatment Status Complete  Initial Drain Volume 438  Average Dwell Time-Hour(s) 1  Average Dwell Time-Min(s) 30  Average Drain Time 22  Total Therapy Volume 12504  Total Therapy Time-Hour(s) 10  Total Therapy Time-Min(s) 22  Weight after Drain 180 lb (81.6 kg)  Effluent Appearance Clear  Cell Count on Daytime Exchange N/A  Fluid Balance - CCPD  Total UF (+ value on cycler, pt loss) 2595 mL  Procedure Comments  Tolerated treatment well? Yes  Peritoneal Dialysis Comments UF 2595, tolerated well, no complaints  Hand-Off documentation  Handoff Given Given to shift RN/LPN  Report given to (Full Name) Claiborne Billings, RN  Handoff Received Received from shift RN/LPN  Report received from (Full Name) Amerigo Mcglory.

## 2022-03-12 ENCOUNTER — Inpatient Hospital Stay (HOSPITAL_COMMUNITY)
Admission: EM | Disposition: A | Discharge status: Home / Self Care | Payer: Self-pay | Source: Home / Self Care | Attending: Student

## 2022-03-12 DIAGNOSIS — I2510 Atherosclerotic heart disease of native coronary artery without angina pectoris: Secondary | ICD-10-CM | POA: Diagnosis not present

## 2022-03-12 DIAGNOSIS — E7849 Other hyperlipidemia: Secondary | ICD-10-CM | POA: Diagnosis not present

## 2022-03-12 DIAGNOSIS — I429 Cardiomyopathy, unspecified: Secondary | ICD-10-CM

## 2022-03-12 DIAGNOSIS — D638 Anemia in other chronic diseases classified elsewhere: Secondary | ICD-10-CM | POA: Diagnosis not present

## 2022-03-12 DIAGNOSIS — R410 Disorientation, unspecified: Secondary | ICD-10-CM

## 2022-03-12 DIAGNOSIS — I214 Non-ST elevation (NSTEMI) myocardial infarction: Secondary | ICD-10-CM | POA: Diagnosis not present

## 2022-03-12 DIAGNOSIS — J9601 Acute respiratory failure with hypoxia: Secondary | ICD-10-CM | POA: Diagnosis not present

## 2022-03-12 HISTORY — PX: LEFT HEART CATH AND CORONARY ANGIOGRAPHY: CATH118249

## 2022-03-12 LAB — POCT ACTIVATED CLOTTING TIME
Activated Clotting Time: 179 seconds
Activated Clotting Time: 185 seconds
Activated Clotting Time: 212 seconds
Activated Clotting Time: 217 seconds
Activated Clotting Time: 239 seconds
Activated Clotting Time: 250 seconds
Activated Clotting Time: 196 seconds
Activated Clotting Time: 185 seconds

## 2022-03-12 LAB — RENAL FUNCTION PANEL
Albumin: 2.5 g/dL — ABNORMAL LOW (ref 3.5–5.0)
Anion gap: 13 (ref 5–15)
BUN: 64 mg/dL — ABNORMAL HIGH (ref 8–23)
CO2: 24 mmol/L (ref 22–32)
Calcium: 8.4 mg/dL — ABNORMAL LOW (ref 8.9–10.3)
Chloride: 101 mmol/L (ref 98–111)
Creatinine, Ser: 6.9 mg/dL — ABNORMAL HIGH (ref 0.61–1.24)
GFR, Estimated: 7 mL/min — ABNORMAL LOW (ref >60)
Glucose, Bld: 129 mg/dL — ABNORMAL HIGH (ref 70–99)
Phosphorus: 4.3 mg/dL (ref 2.5–4.6)
Potassium: 4.2 mmol/L (ref 3.5–5.1)
Sodium: 138 mmol/L (ref 135–145)

## 2022-03-12 LAB — PROTIME-INR
INR: 1.1 (ref 0.8–1.2)
Prothrombin Time: 14.3 seconds (ref 11.4–15.2)

## 2022-03-12 LAB — GLUCOSE, CAPILLARY
Glucose-Capillary: 122 mg/dL — ABNORMAL HIGH (ref 70–99)
Glucose-Capillary: 124 mg/dL — ABNORMAL HIGH (ref 70–99)
Glucose-Capillary: 126 mg/dL — ABNORMAL HIGH (ref 70–99)
Glucose-Capillary: 142 mg/dL — ABNORMAL HIGH (ref 70–99)
Glucose-Capillary: 221 mg/dL — ABNORMAL HIGH (ref 70–99)

## 2022-03-12 LAB — MAGNESIUM: Magnesium: 2.1 mg/dL (ref 1.7–2.4)

## 2022-03-12 LAB — CBC
HCT: 34.1 % — ABNORMAL LOW (ref 39.0–52.0)
Hemoglobin: 11.1 g/dL — ABNORMAL LOW (ref 13.0–17.0)
MCH: 32.9 pg (ref 26.0–34.0)
MCHC: 32.6 g/dL (ref 30.0–36.0)
MCV: 101.2 fL — ABNORMAL HIGH (ref 80.0–100.0)
Platelets: 309 10*3/uL (ref 150–400)
RBC: 3.37 MIL/uL — ABNORMAL LOW (ref 4.22–5.81)
RDW: 13.2 % (ref 11.5–15.5)
WBC: 13.5 10*3/uL — ABNORMAL HIGH (ref 4.0–10.5)
nRBC: 0.6 % — ABNORMAL HIGH (ref 0.0–0.2)

## 2022-03-12 SURGERY — LEFT HEART CATH AND CORONARY ANGIOGRAPHY
Anesthesia: LOCAL

## 2022-03-12 MED ORDER — HEPARIN SODIUM (PORCINE) 1000 UNIT/ML IJ SOLN
INTRAMUSCULAR | Status: AC
  Filled 2022-03-12: qty 10

## 2022-03-12 MED ORDER — ASPIRIN 81 MG PO CHEW
81.0000 mg | CHEWABLE_TABLET | ORAL | Status: AC
  Administered 2022-03-12: 81 mg via ORAL
  Filled 2022-03-12: qty 1

## 2022-03-12 MED ORDER — FENTANYL CITRATE (PF) 100 MCG/2ML IJ SOLN
INTRAMUSCULAR | Status: AC
  Filled 2022-03-12: qty 2

## 2022-03-12 MED ORDER — FAMOTIDINE IN NACL 20-0.9 MG/50ML-% IV SOLN
INTRAVENOUS | Status: AC
  Filled 2022-03-12: qty 50

## 2022-03-12 MED ORDER — LIDOCAINE HCL (PF) 1 % IJ SOLN
INTRAMUSCULAR | Status: AC
  Filled 2022-03-12: qty 30

## 2022-03-12 MED ORDER — LIDOCAINE HCL (PF) 1 % IJ SOLN
INTRAMUSCULAR | Status: DC | PRN
  Administered 2022-03-12: 5 mL
  Administered 2022-03-12: 15 mL

## 2022-03-12 MED ORDER — ONDANSETRON HCL 4 MG/2ML IJ SOLN
INTRAMUSCULAR | Status: DC | PRN
  Administered 2022-03-12: 4 mg via INTRAVENOUS

## 2022-03-12 MED ORDER — SODIUM CHLORIDE 0.9 % IV SOLN: 250.0000 mL | INTRAVENOUS | Status: DC | PRN

## 2022-03-12 MED ORDER — CLOPIDOGREL BISULFATE 300 MG PO TABS
ORAL_TABLET | ORAL | Status: AC
  Filled 2022-03-12: qty 2

## 2022-03-12 MED ORDER — HEPARIN SODIUM (PORCINE) 1000 UNIT/ML IJ SOLN
INTRAMUSCULAR | Status: DC | PRN
  Administered 2022-03-12: 4000 [IU] via INTRAVENOUS
  Administered 2022-03-12: 8000 [IU] via INTRAVENOUS
  Administered 2022-03-12: 4000 [IU] via INTRAVENOUS

## 2022-03-12 MED ORDER — GENTAMICIN SULFATE 0.1 % EX CREA
TOPICAL_CREAM | Freq: Every day | CUTANEOUS | Status: DC
  Filled 2022-03-12: qty 15

## 2022-03-12 MED ORDER — DELFLEX-LC/4.25% DEXTROSE 483 MOSM/L IP SOLN: INTRAPERITONEAL | Status: DC

## 2022-03-12 MED ORDER — VERAPAMIL HCL 2.5 MG/ML IV SOLN
INTRAVENOUS | Status: AC
  Filled 2022-03-12: qty 2

## 2022-03-12 MED ORDER — IOHEXOL 350 MG/ML SOLN
INTRAVENOUS | Status: DC | PRN
  Administered 2022-03-12: 70 mL via INTRA_ARTERIAL

## 2022-03-12 MED ORDER — HYDRALAZINE HCL 20 MG/ML IJ SOLN: 10.0000 mg | INTRAMUSCULAR | Status: AC | PRN

## 2022-03-12 MED ORDER — DELFLEX-LC/2.5% DEXTROSE 394 MOSM/L IP SOLN: INTRAPERITONEAL | Status: DC

## 2022-03-12 MED ORDER — HEPARIN (PORCINE) IN NACL 1000-0.9 UT/500ML-% IV SOLN
INTRAVENOUS | Status: DC | PRN
  Administered 2022-03-12 (×2): 500 mL

## 2022-03-12 MED ORDER — SODIUM CHLORIDE 0.9% FLUSH: 3.0000 mL | INTRAVENOUS | Status: DC | PRN

## 2022-03-12 MED ORDER — SODIUM CHLORIDE 0.9% FLUSH
3.0000 mL | Freq: Two times a day (BID) | INTRAVENOUS | Status: DC
  Administered 2022-03-12 – 2022-03-15 (×6): 3 mL via INTRAVENOUS

## 2022-03-12 MED ORDER — HEPARIN (PORCINE) IN NACL 1000-0.9 UT/500ML-% IV SOLN
INTRAVENOUS | Status: AC
  Filled 2022-03-12: qty 1000

## 2022-03-12 MED ORDER — NITROGLYCERIN 1 MG/10 ML FOR IR/CATH LAB
INTRA_ARTERIAL | Status: AC
  Filled 2022-03-12: qty 10

## 2022-03-12 MED ORDER — CLOPIDOGREL BISULFATE 75 MG PO TABS
75.0000 mg | ORAL_TABLET | Freq: Every day | ORAL | Status: DC
  Administered 2022-03-13 – 2022-03-15 (×3): 75 mg via ORAL
  Filled 2022-03-12 (×3): qty 1

## 2022-03-12 MED ORDER — FENTANYL CITRATE (PF) 100 MCG/2ML IJ SOLN
INTRAMUSCULAR | Status: DC | PRN
  Administered 2022-03-12 (×2): 12.5 ug via INTRAVENOUS

## 2022-03-12 MED ORDER — ATORVASTATIN CALCIUM 40 MG PO TABS
40.0000 mg | ORAL_TABLET | Freq: Every day | ORAL | Status: DC
  Administered 2022-03-13 – 2022-03-14 (×2): 40 mg via ORAL
  Filled 2022-03-12 (×2): qty 1

## 2022-03-12 MED ORDER — ONDANSETRON HCL 4 MG/2ML IJ SOLN
INTRAMUSCULAR | Status: AC
  Filled 2022-03-12: qty 2

## 2022-03-12 MED ORDER — HEPARIN SODIUM (PORCINE) 5000 UNIT/ML IJ SOLN
5000.0000 [IU] | Freq: Three times a day (TID) | INTRAMUSCULAR | Status: DC
  Administered 2022-03-12 – 2022-03-14 (×4): 5000 [IU] via SUBCUTANEOUS
  Filled 2022-03-12 (×5): qty 1

## 2022-03-12 MED ORDER — CLOPIDOGREL BISULFATE 300 MG PO TABS
ORAL_TABLET | ORAL | Status: DC | PRN
  Administered 2022-03-12: 600 mg via ORAL

## 2022-03-12 MED ORDER — FAMOTIDINE IN NACL 20-0.9 MG/50ML-% IV SOLN
INTRAVENOUS | Status: AC | PRN
  Administered 2022-03-12: 20 mg via INTRAVENOUS

## 2022-03-12 SURGICAL SUPPLY — 16 items
BALLN EMERGE MR 2.0X12 (BALLOONS)
BALLOON EMERGE MR 2.0X12 (BALLOONS) IMPLANT
CATH INFINITI 5FR MULTPACK ANG (CATHETERS) IMPLANT
CATH LAUNCHER 6FR EBU 4 (CATHETERS) IMPLANT
ELECT DEFIB PAD ADLT CADENCE (PAD) IMPLANT
KIT ESSENTIALS PG (KITS) IMPLANT
KIT HEART LEFT (KITS) ×1 IMPLANT
KIT MICROPUNCTURE NIT STIFF (SHEATH) IMPLANT
PACK CARDIAC CATHETERIZATION (CUSTOM PROCEDURE TRAY) ×1 IMPLANT
SHEATH PINNACLE 5F 10CM (SHEATH) IMPLANT
SHEATH PINNACLE 6F 10CM (SHEATH) IMPLANT
SHEATH PROBE COVER 6X72 (BAG) IMPLANT
TRANSDUCER W/STOPCOCK (MISCELLANEOUS) ×1 IMPLANT
TUBING CIL FLEX 10 FLL-RA (TUBING) ×1 IMPLANT
WIRE EMERALD 3MM-J .035X150CM (WIRE) IMPLANT
WIRE RUNTHROUGH .014X180CM (WIRE) IMPLANT

## 2022-03-12 NOTE — Interval H&P Note (Signed)
History and Physical Interval Note:  03/12/2022 10:18 AM  William [name]  has presented today for surgery, with the diagnosis of NSTEMI.  The various methods of treatment have been discussed with the patient and family. After consideration of risks, benefits and other options for treatment, the patient has consented to  Procedure(s): LEFT HEART CATH AND CORONARY ANGIOGRAPHY (N/A) as a surgical intervention.  The patient's history has been reviewed, patient examined, no change in status, stable for surgery.  I have reviewed the patient's chart and labs.  Questions were answered to the patient's satisfaction.    Cath Lab Visit (complete for each Cath Lab visit)  Clinical Evaluation Leading to the Procedure:   ACS: Yes.    Non-ACS:  N/A  Adalind Weitz

## 2022-03-12 NOTE — Care Management Important Message (Signed)
Important Message  Patient Details  Name: William [name] MRN: [id number] Date of Birth: [birth year]   Medicare Important Message Given:  Yes     Shelda Altes 03/12/2022, 7:51 AM

## 2022-03-12 NOTE — Progress Notes (Signed)
Pt in bed no c/os vitals stable wife at bedside PD tx complete

## 2022-03-12 NOTE — Progress Notes (Signed)
[facility]  Date: 03/12/22  Time: 10:21 AM  Patient present for cardiac catheterization for evaluation of NSTEMI.  I  have spoken with William [name] and his wife; we have agreed to rescind his DNR order during the cath procedure.  Full code order has been placed.  He will return to DNR/DNI status after completion of the procedure.  Nelva Bush, MD [facility]

## 2022-03-12 NOTE — Progress Notes (Addendum)
SITE AREA: right femoral/groin  SITE PRIOR TO REMOVAL:  LEVEL 0  PRESSURE APPLIED FOR: approximately 25 minutes for arterial sheath removal and approximately 10 minutes for veinous sheath removal  MANUAL: yes  PATIENT STATUS DURING PULL: pt vss, pt oriented to self, but increased agitation noted with sheath removal, pt wanting to get up and oob before and during sheath removal process  POST PULL SITE:  LEVEL 0  POST PULL INSTRUCTIONS GIVEN: yes  POST PULL PULSES PRESENT: bilateral pedal pulses palpable at +1  DRESSING APPLIED: gauze with tegaderm  BEDREST BEGINS @ 1619  COMMENTS: new male purewick placed by Chilton Si, RN during sheath removal, arterial sheath removed 1st and pressure held for approximately 15 minutes and then veinous sheath removed and an additional 10 minutes of pressure applied to both sites, Fraser T. And Yara B., RN's remain at bedside for support and to help keep pt calm and legs straight, pt instructed many times about not moving and bending legs and bedrest, safety maintained  Peritoneal dialysis catheter noted to right mid abdomen, clamped

## 2022-03-12 NOTE — H&P (View-Only) (Signed)
Progress Note  Patient Name: William [name] Date of Encounter: 03/12/2022  Primary Cardiologist: Janina Mayo, MD  Subjective   NAEO SVT He is awake and alert, He can answer questions  and follow directions. He understands he is planned for LHC.  Inpatient Medications    Scheduled Meds:  sodium chloride   Intravenous Once   aspirin  81 mg Oral Daily   atorvastatin  20 mg Oral Daily   carvedilol  3.125 mg Oral BID WC   Chlorhexidine Gluconate Cloth  6 each Topical Daily   darbepoetin (ARANESP) injection - NON-DIALYSIS  200 mcg Subcutaneous Q Mon-1800   gentamicin cream  1 Application Topical Daily   guaiFENesin  600 mg Oral BID   heparin injection (subcutaneous)  5,000 Units Subcutaneous Q8H   insulin aspart  1-3 Units Subcutaneous Q4H   insulin detemir  5 Units Subcutaneous Q12H   melatonin  3 mg Oral QHS   pantoprazole  40 mg Oral Daily   potassium chloride  40 mEq Oral BID   QUEtiapine  50 mg Oral QHS   sevelamer carbonate  800 mg Oral TID WC   sodium chloride flush  3 mL Intravenous Q12H   Continuous Infusions:  sodium chloride Stopped (03/04/22 1031)   sodium chloride     dialysis solution 2.5% low-MG/low-CA     PRN Meds: sodium chloride, sodium chloride, acetaminophen **OR** acetaminophen, calcium carbonate, guaiFENesin-dextromethorphan, haloperidol lactate, ipratropium-albuterol, metoprolol tartrate, mouth rinse, [COMPLETED] polyethylene glycol **FOLLOWED BY** polyethylene glycol, prochlorperazine, sodium chloride flush, white petrolatum   Vital Signs    Vitals:   03/12/22 0000 03/12/22 0350 03/12/22 0742 03/12/22 0806  BP: 113/60 (!) 124/95 127/87 125/80  Pulse: 79 83 82 (!) 122  Resp: '18 20 18 17  '$ Temp: 98.3 F (36.8 C) 97.6 F (36.4 C) 97.6 F (36.4 C) (!) 97.5 F (36.4 C)  TempSrc: Oral Oral Oral Oral  SpO2: 94% 97% 96% 94%  Weight:      Height:        Intake/Output Summary (Last 24 hours) at 03/12/2022 0846 Last data filed at 03/11/2022  1700 Gross per 24 hour  Intake 360 ml  Output 1 ml  Net 359 ml   Filed Weights   03/07/22 1728 03/08/22 0657 03/09/22 0733  Weight: 83.9 kg 84.2 kg 83.6 kg    Telemetry     Personally reviewed, HR controlled but he continues to have paroxysms of possible atrial tachycardia.  ECG    Diffuse ST segment depressions except in V1 and aVR where there is ST elevation 1/7  Physical Exam   GEN: No acute distress.  A&Ox3 Neck: No JVD. Cardiac: RRR, no murmur, rub, or gallop.  Respiratory: Nonlabored. Clear to auscultation bilaterally. GI: Soft, nontender, bowel sounds present. MS: No edema; No deformity. Neuro:  Nonfocal. Psych: Alert and oriented x 3. Normal affect.  Labs    Chemistry Recent Labs  Lab 03/08/22 1520 03/09/22 0731 03/10/22 0057 03/11/22 0052 03/12/22 0023  NA 135   < > 139 140 138  K 3.7   < > 3.2* 3.1* 4.2  CL 96*   < > 101 100 101  CO2 21*   < > '23 24 24  '$ GLUCOSE 129*   < > 146* 140* 129*  BUN 76*   < > 72* 64* 64*  CREATININE 6.89*   < > 7.15* 6.68* 6.90*  CALCIUM 8.3*   < > 8.7* 8.8* 8.4*  PROT 6.1*  --   --   --   --  ALBUMIN 2.8*  --   --  2.6* 2.5*  AST 32  --   --   --   --   ALT 31  --   --   --   --   ALKPHOS 49  --   --   --   --   BILITOT 0.6  --   --   --   --   GFRNONAA 8*   < > 7* 8* 7*  ANIONGAP 18*   < > 15 16* 13   < > = values in this interval not displayed.     Hematology Recent Labs  Lab 03/10/22 0057 03/11/22 0052 03/12/22 0023  WBC 12.8* 12.6* 13.5*  RBC 3.13* 3.33* 3.37*  HGB 10.5* 11.1* 11.1*  HCT 30.8* 33.3* 34.1*  MCV 98.4 100.0 101.2*  MCH 33.5 33.3 32.9  MCHC 34.1 33.3 32.6  RDW 13.3 13.1 13.2  PLT 340 318 309    Cardiac Enzymes Recent Labs  Lab 03/08/22 0553 03/08/22 0756 03/08/22 0917 03/08/22 1520 03/08/22 1850  TROPONINIHS 2,478* 2,695* 2,560* 1,534* 1,415*    BNPNo results for input(s): "BNP", "PROBNP" in the last 168 hours.   DDimerNo results for input(s): "DDIMER" in the last 168 hours.    Radiology    No results found.  Cardiac Studies  Echo from 03/04/2022 LVEF 40 to 80% Grade 1 diastolic dysfunction RV systolic function is mildly reduced Mild MR Mild AI   Assessment & Plan    Patient is 82 year old M known to have ESRD on PD, HTN, hypothyroidism, HLD, mild MR presented with acute hypoxic respiratory failure secondary to significant pneumonia and NSTEMI. He is stable  # NSTEMI, likely type II in the setting of pneumonia but type I cannot be ruled out completely -Patient's troponins initially were 800 - 900s but peaked to 2695 during this hospitalization. EKG showed diffuse ST segment depressions except in aVR and V1 that has ST elevation. Echo showed LVEF 40 to 45% with no RWMA. Patient denied having chest pain or SOB but did have SOB prior to admission possibly from pneumonia. He completed antibiotic course for pneumonia and his confusion resolved. Hemoglobin has been stable and no bleeding episodes ever in his life. Patient will benefit from invasive ischemia evaluation with LHC prior to discharge.Risks and benefits of cardiac catheterization have been discussed with the patient.  These include bleeding, infection, kidney damage, stroke, heart attack, death. The patient understands these risks and is willing to proceed. -Patient is posted for [facility] on 03/12/2022. Keep n.p.o. after midnight.  CBC, CMP, INR in AM.  He is DNR but agreed to convert his CODE STATUS to full code for the LHC procedure. -Continue aspirin 81 mg once daily -Continue atorvastatin 20 mg nightly -Switch carvedilol to metoprolol 25 mg twice daily  # Brief paroxysms of possible atrial tachycardia/SVT -Switch carvedilol to metoprolol 25 mg twice daily -2-week event monitor upon discharge was planned prior -OSA evaluation outpatient  # Acute hypoxic respiratory failure secondary to pneumonia, extubated on 03/05/2022: Patient pleated antibiotic course for pneumonia.  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  Patient did not have any bleeding episodes in his life. Hemoglobin was 7.9 in the current admission (which could be secondary to sepsis and anemia of chronic disease) for which he received PRBC transfusion. Hemoglobin stable today at 11.1.  # HFrEF, LVEF 40 to 45%, rule out CAD: Switch carvedilol to metoprolol 25 mg twice daily. Euvolemic  #  HLD: Not on therapy prior to admission.  LDL 122 in 01/2022.  Repeat lipid panel with LDL at 94.  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 Atorvastatin 20 mg nightly is initiated during this hospitalization.    Time Spent Directly with Patient:  I have spent a total of 35 minutes with the patient reviewing hospital notes, telemetry, EKGs, labs and examining the patient as well as establishing an assessment and plan that was discussed personally with the patient.  > 50% of time was spent in direct patient care.   Signed, Janina Mayo, MD  03/12/2022, 8:46 AM

## 2022-03-12 NOTE — Progress Notes (Addendum)
Nephrology Progress Note  Subjective:  Seen in room. LHC today w/ LVEDP 28.   Objective Vital signs in last 24 hours: Vitals:   03/12/22 1435 03/12/22 1440 03/12/22 1445 03/12/22 1450  BP:  109/71    Pulse: 74 75 80 77  Resp: (!) 32 (!) 30 (!) 24 18  Temp:      TempSrc:      SpO2: 91% 91% 97% 94%  Weight:      Height:       Physical Exam:    General adult male in bed in no acute distress HEENT normocephalic atraumatic extraocular movements intact sclera anicteric Neck supple trachea midline Lungs clear bilaterally, normal work of breathing on room air Heart S1S2 no rub Abdomen soft nontender distended; PD catheter in place and dressed Extremities no edema appreciated; no cyanosis Psych no current agitation  Neuro - alert, following commands   Summary: Pt is a 82 y.o. yo male with ESRD on PD who was admitted on 03/02/2022 with PNA but also felt to have NSTEMI  OP PD: f/b Dr Dimas Aguas from [facility]. 4 exchanges overnight, 2500 cc dwell vol, uses mostly 1.5% and 2.5% bags.  On PD for 3 years. Dry wt 181- 185  lbs per the pt.     Assessment/ Plan CAD - w/ tight LAD stenosis; unable to fix per cardiology today due to difficulties w/ anticoagulation.  Right sided community acquired PNA-  extubated.  Completed antibiotics  ESRD - on PD at home.  Continue PD here, will ^ to 1/2 4.25% and 1/2 2.5 % dextrose bags given high LVEDP at [facility] of 28.  We have increased to 5 cycles as well. Miralax once daily PRN Hypokalemia - Kcl ordered  again; increased to 110mq BID. May need to adjust Acute hypoxemic respiratory failure - some volume overload, ^'ing UF as tolerated w/ PD Anemia of CKD -  he is ordered aranesp 200 mcg weekly on Mondays and got the last dose.  Hb improving.  Follow trends and would reduce next dose if able  Secondary hyperparathyroidism-  hyperphosphatemia however he states he has never been on a binder and that phos is usually controlled.  Have started renvela for  now HTN -  controlled NSTEMI- per cardiology and primary team; bradycardia resolved   RKelly Splinter MD 03/12/2022, 3:06 PM  Recent Labs  Lab 03/11/22 0052 03/12/22 0023  HGB 11.1* 11.1*  ALBUMIN 2.6* 2.5*  CALCIUM 8.8* 8.4*  PHOS 5.0* 4.3  CREATININE 6.68* 6.90*  K 3.1* 4.2    Inpatient medications:  [MAR Hold] sodium chloride   Intravenous Once   [MAR Hold] aspirin  81 mg Oral Daily   [MAR Hold] atorvastatin  20 mg Oral Daily   [MAR Hold] carvedilol  3.125 mg Oral BID WC   [MAR Hold] Chlorhexidine Gluconate Cloth  6 each Topical Daily   [MAR Hold] darbepoetin (ARANESP) injection - NON-DIALYSIS  200 mcg Subcutaneous Q Mon-1800   [MAR Hold] gentamicin cream  1 Application Topical Daily   [MAR Hold] guaiFENesin  600 mg Oral BID   [MAR Hold] heparin injection (subcutaneous)  5,000 Units Subcutaneous Q8H   [MAR Hold] insulin aspart  1-3 Units Subcutaneous Q4H   [MAR Hold] insulin detemir  5 Units Subcutaneous Q12H   [MAR Hold] melatonin  3 mg Oral QHS   [MAR Hold] pantoprazole  40 mg Oral Daily   [MAR Hold] potassium chloride  40 mEq Oral BID   [MAR Hold] QUEtiapine  50 mg  Oral QHS   [MAR Hold] sevelamer carbonate  800 mg Oral TID WC   [MAR Hold] sodium chloride flush  3 mL Intravenous Q12H    [MAR Hold] sodium chloride 10 mL/hr at 03/12/22 1057   sodium chloride     [MAR Hold] dialysis solution 2.5% low-MG/low-CA     famotidine 20 mg (03/12/22 1105)   [MAR Hold] sodium chloride, sodium chloride, [MAR Hold] acetaminophen **OR** [MAR Hold] acetaminophen, [MAR Hold] calcium carbonate, clopidogrel, famotidine, fentaNYL, [MAR Hold] guaiFENesin-dextromethorphan, [MAR Hold] haloperidol lactate, Heparin (Porcine) in NaCl, heparin sodium (porcine), hydrALAZINE, iohexol, [MAR Hold] ipratropium-albuterol, lidocaine (PF), [MAR Hold] metoprolol tartrate, ondansetron, [MAR Hold] mouth rinse, [COMPLETED] polyethylene glycol **FOLLOWED BY** [MAR Hold] polyethylene glycol, [MAR Hold]  prochlorperazine, sodium chloride flush, [MAR Hold] white petrolatum

## 2022-03-12 NOTE — Progress Notes (Signed)
PRE PD treatment.  PD treatment initiated via aseptic technique. Consent signed and in chart. Patient is alert and oriented. No complaints of pain. No specimen collected. PD exit site clean, dry and intact. Gentamycin and new dressing applied. Bedside RN educated on PD machine and how to contact tech support when PD machine alarms.    03/11/22 2050  Peritoneal Catheter Right lower abdomen  No placement date or time found.   Catheter Location: Right lower abdomen  Site Assessment Clean, Dry, Intact  Drainage Description None  Catheter status Deaccessed  Dressing Gauze/Drain sponge  Dressing Status Clean, Dry, Intact  Dressing Intervention Removed  Cycler Setup  Total Number of Night Cycles 5  Night Fill Volume 2500  Dianeal Solution Dextrose 4.25% in 6000 mL Low Cal/Low Mag  Night Dwell Time per Cycle - Hour(s) 1  Night Dwell Time per Cycle - Minute(s) 30  Night Time Therapy - Minute(s) 24  Night Time Therapy - Hour(s) 10  Minimum Initial Drain Volume 0  Maximum Peritoneal Volume 3750  Night/Total Therapy Volume 12500  Day Exchange No  Hand-Off documentation  Handoff Given Given to shift RN/LPN  Report given to (Full Name) Jayquan Bradsher, Nelda Marseille, RN  Handoff Received Received from shift RN/LPN  Report received from (Full Name) Paulette Blanch, RN

## 2022-03-12 NOTE — Progress Notes (Signed)
PROGRESS NOTE  William [name] OFH:[id number] DOB: Apr 24, 1940   PCP: [name], PA-C  Patient is from: Home.  DOA: 03/02/2022 LOS: 9  Chief complaints Chief Complaint  Patient presents with   Emesis   Shortness of Breath     Brief Narrative / Interim history: 82 year old man with hx of combined CHF, ESRD on PD, HTN, HLD and BPH presenting with with productive cough, dyspnea on exertion and subjective fever and admitted to ICU for acute respiratory failure with hypoxia in the setting of multifocal pneumonia.  He was intubated from 12/29-1/1.  Started on IV ceftriaxone and azithromycin on 12/29, and vancomycin on 12/30.  He was transferred out of ICU on 1/2 but developed increasing agitation, confusion the night of 1/3 and started on Precedex drip and sent back to ICU.  EKG on 1/4 with increased ST/T changes in inferolateral leads.  He also had significantly elevated troponin, 800>>> 2700 (peak).  TTE with LVEF of 40 to 45% and G1-DD.  Cardiology consulted.   Eventually, delirium improved, came off Precedex drip and transferred to [facility] hospitalist service on 03/10/2021.  Completed antibiotic course on 03/10/2021.  Respiratory failure resolved.  Cardiology and nephrology following.    LHC on 1/8 with multivessel CAD with most severe lesion in mid LAD with 80 to 90% stenosis.  Unfortunately, PCI aborted due to inability to achieve therapeutic anticoagulation despite administration of IV heparin.  Plan to reattempt PCI to mid LAD using intraprocedural bivalirudin on 1/9.   Subjective: Seen and examined earlier this morning before he went down for heart catheterization.  No major events overnight of this morning.  Reports improvement in his cough.  Denies shortness of breath, chest pain or GI symptoms.  Patient's wife at bedside.  Objective: Vitals:   03/12/22 1500 03/12/22 1505 03/12/22 1510 03/12/22 1515  BP:   115/70   Pulse: 79 79 79 80  Resp: 14 (!) 22 (!) 23 (!) 28   Temp:      TempSrc:      SpO2: 95% 97% 97% 98%  Weight:      Height:        Examination:   GENERAL: No apparent distress.  Nontoxic. HEENT: MMM.  Vision and hearing grossly intact.  NECK: Supple.  No apparent JVD.  RESP:  No IWOB.  Fair aeration bilaterally. CVS:  RRR. Heart sounds normal.  ABD/GI/GU: BS+. Abd soft, NTND.  MSK/EXT:  Moves extremities. No apparent deformity. No edema.  SKIN: no apparent skin lesion or wound NEURO: Awake and alert. Oriented appropriately.  No apparent focal neuro deficit. PSYCH: Calm. Normal affect.   Procedures:  12/29-1/1 intubation and mechanical ventilation  Microbiology summarized: COVID-19, influenza and RSV PCR nonreactive. MRSA PCR screen negative. Blood cultures NGTD  Assessment and plan: Principal Problem:   Acute respiratory failure with hypoxia (HCC) Active Problems:   Essential hypertension   ESRD (end stage renal disease) (HCC)   Multifocal pneumonia   Anemia in chronic kidney disease (CKD)   Acute metabolic acidosis   Delirium   Acute on chronic combined systolic and diastolic CHF (congestive heart failure) (HCC)   Non-STEMI (non-ST elevated myocardial infarction) ([facility])  Acute hypoxemic respiratory failure due to multifocal pneumonia: CT chest, abdomen and pelvis with patchy airspace disease in both lungs suggesting multifocal pneumonia, moderate right effusion and prominent mediastinal lymph nodes.  Culture data as above.  Respiratory failure resolved.  Completed antibiotic and steroid course. -Pulmonary toilet with incentive spirometry -Mucolytic's and antitussives.  Agitation/delirium/insomnia: Seems to have resolved.  Required Precedex while in ICU -Continue p.o. Seroquel 50 mg nightly -IV Haldol 1 mg every 6 hours as needed -Monitor QTc, 458 on 1/5. -Optimize electrolytes   Non-STEMI/multivessel CAD: EKG EKG showed diffuse STD except in aVR and V1 that has ST elevation. Echo showed LVEF 40 to 45%. Troponin  trended from [phone] (peak).  Patient without chest pain. LHC on 1/8 with multivessel CAD with most severe lesion in mid LAD with 80 to 90% stenosis.  Unfortunately, PCI aborted due to inability to achieve therapeutic anticoagulation despite administration of IV heparin.   -Cardiology planning reattempt PCI to mid LAD on 1/9 using alternative intraprocedural bivalirudin.  -Continue low-dose aspirin, Lipitor and Coreg -N.p.o. after midnight  Acute combined CHF: TTE with LVEF of 40 to 45% and G1 DD.  Appears euvolemic as of now. -Fluid management by peritoneal dialysis per nephrology.  ESRD on peritoneal dialysis. Bone mineral disorder/hyperphosphatemia/hypocalcemia/hyponatremia -Peritoneal dialysis per nephrology  Anemia of renal disease: Transfused 1 unit on 1/4 with appropriate response.  No signs of bleeding.  H&H stable and better than baseline. Recent Labs    03/04/22 1116 03/05/22 0416 03/06/22 0313 03/07/22 0043 03/08/22 0553 03/08/22 1520 03/09/22 0731 03/10/22 0057 03/11/22 0052 03/12/22 0023  HGB 9.5* 8.0* 8.5* 9.6* 7.9* 9.8* 9.6* 10.5* 11.1* 11.1*  -Continue monitoring  Essential hypertension: Normotensive. -Cardiac meds as above  Generalized weakness/physical deconditioning -PT/OT-recommended HH PT/OT.  Hypokalemia: Resolved.  Constipation: Resolved. -Bowel regimen as needed  Coughing/gagging while eating: Improving. -SLP recommend regular diet with aspiration precaution.  Body mass index is 25 kg/m.          DVT prophylaxis:  heparin injection 5,000 Units Start: 03/04/22 1500  Code Status: DNR/DNI Family Communication: Updated patient's wife at bedside. Level of care: Telemetry Cardiac Status is: Inpatient Remains inpatient appropriate because: Non-STEMI/multivessel CAD.  Plan for PCI in 1/9   Final disposition: Home with home health once cleared by cardiology Consultants:  Pulmonology admitted patient Nephrology Cardiology  Sch Meds:   Scheduled Meds:  [MAR Hold] sodium chloride   Intravenous Once   [MAR Hold] aspirin  81 mg Oral Daily   [MAR Hold] atorvastatin  20 mg Oral Daily   [MAR Hold] carvedilol  3.125 mg Oral BID WC   [MAR Hold] Chlorhexidine Gluconate Cloth  6 each Topical Daily   [MAR Hold] darbepoetin (ARANESP) injection - NON-DIALYSIS  200 mcg Subcutaneous Q Mon-1800   [MAR Hold] guaiFENesin  600 mg Oral BID   [MAR Hold] heparin injection (subcutaneous)  5,000 Units Subcutaneous Q8H   [MAR Hold] insulin aspart  1-3 Units Subcutaneous Q4H   [MAR Hold] insulin detemir  5 Units Subcutaneous Q12H   [MAR Hold] melatonin  3 mg Oral QHS   [MAR Hold] pantoprazole  40 mg Oral Daily   [MAR Hold] potassium chloride  40 mEq Oral BID   [MAR Hold] QUEtiapine  50 mg Oral QHS   [MAR Hold] sevelamer carbonate  800 mg Oral TID WC   [MAR Hold] sodium chloride flush  3 mL Intravenous Q12H   Continuous Infusions:  [MAR Hold] sodium chloride 10 mL/hr at 03/12/22 1057   sodium chloride     dialysis solution 2.5% low-MG/low-CA     dialysis solution 4.25% low-MG/low-CA     famotidine 20 mg (03/12/22 1105)   PRN Meds:.[MAR Hold] sodium chloride, sodium chloride, [MAR Hold] acetaminophen **OR** [MAR Hold] acetaminophen, [MAR Hold] calcium carbonate, clopidogrel, famotidine, fentaNYL, [MAR Hold] guaiFENesin-dextromethorphan, [MAR Hold] haloperidol lactate,  Heparin (Porcine) in NaCl, heparin sodium (porcine), hydrALAZINE, iohexol, [MAR Hold] ipratropium-albuterol, lidocaine (PF), [MAR Hold] metoprolol tartrate, ondansetron, [MAR Hold] mouth rinse, [COMPLETED] polyethylene glycol **FOLLOWED BY** [MAR Hold] polyethylene glycol, [MAR Hold] prochlorperazine, sodium chloride flush, [MAR Hold] white petrolatum  Antimicrobials: Anti-infectives (From admission, onward)    Start     Dose/Rate Route Frequency Ordered Stop   03/03/22 2200  cefTRIAXone (ROCEPHIN) 2 g in sodium chloride 0.9 % 100 mL IVPB        2 g 200 mL/hr over 30 Minutes  Intravenous Every 24 hours 03/03/22 1546 03/10/22 0028   03/03/22 2000  azithromycin (ZITHROMAX) 500 mg in sodium chloride 0.9 % 250 mL IVPB        500 mg 250 mL/hr over 60 Minutes Intravenous Every 24 hours 03/03/22 1546 03/06/22 2253   03/03/22 1700  vancomycin (VANCOREADY) IVPB 2000 mg/400 mL        2,000 mg 200 mL/hr over 120 Minutes Intravenous NOW 03/03/22 1608 03/04/22 1011   03/03/22 1611  vancomycin variable dose per unstable renal function (pharmacist dosing)  Status:  Discontinued         Does not apply See admin instructions 03/03/22 1611 03/06/22 0838   03/02/22 2300  cefTRIAXone (ROCEPHIN) 1 g in sodium chloride 0.9 % 100 mL IVPB        1 g 200 mL/hr over 30 Minutes Intravenous  Once 03/02/22 2245 03/02/22 2345   03/02/22 2300  azithromycin (ZITHROMAX) 500 mg in sodium chloride 0.9 % 250 mL IVPB        500 mg 250 mL/hr over 60 Minutes Intravenous  Once 03/02/22 2245 03/03/22 0108        I have personally reviewed the following labs and images: CBC: Recent Labs  Lab 03/08/22 1520 03/09/22 0731 03/10/22 0057 03/11/22 0052 03/12/22 0023  WBC 14.5* 13.2* 12.8* 12.6* 13.5*  NEUTROABS  --   --  10.7*  --   --   HGB 9.8* 9.6* 10.5* 11.1* 11.1*  HCT 29.0* 29.5* 30.8* 33.3* 34.1*  MCV 100.3* 101.0* 98.4 100.0 101.2*  PLT 346 303 340 318 309   BMP &GFR Recent Labs  Lab 03/07/22 0043 03/08/22 0553 03/08/22 1520 03/09/22 0731 03/10/22 0057 03/11/22 0052 03/12/22 0023  NA 138 135 135 140 139 140 138  K 3.6 3.5 3.7 3.5 3.2* 3.1* 4.2  CL 98 98 96* 102 101 100 101  CO2 21* 22 21* 21* _0 GLUCOSE 164* 111* 129* 112* 146* 140* 129*  BUN 73* 71* 76* 73* 72* 64* 64*  CREATININE 6.74* 6.51* 6.89* 6.70* 7.15* 6.68* 6.90*  CALCIUM 8.4* 7.9* 8.3* 8.5* 8.7* 8.8* 8.4*  MG 2.2 2.2 2.2  --   --  2.1 2.1  PHOS 7.9* 7.1* 6.9*  --   --  5.0* 4.3   Estimated Creatinine Clearance: 9.4 mL/min (A) (by C-G formula based on SCr of 6.9 mg/dL (H)). Liver & Pancreas: Recent  Labs  Lab 03/08/22 1520 03/11/22 0052 03/12/22 0023  AST 32  --   --   ALT 31  --   --   ALKPHOS 49  --   --   BILITOT 0.6  --   --   PROT 6.1*  --   --   ALBUMIN 2.8* 2.6* 2.5*   No results for input(s): "LIPASE", "AMYLASE" in the last 168 hours. No results for input(s): "AMMONIA" in the last 168 hours. Diabetic: No results for input(s): "HGBA1C" in the last 72 hours.  Recent Labs  Lab 03/11/22 1625 03/11/22 2032 03/12/22 0009 03/12/22 0356 03/12/22 0833  GLUCAP 115* 126* 126* 142* 124*   Cardiac Enzymes: No results for input(s): "CKTOTAL", "CKMB", "CKMBINDEX", "TROPONINI" in the last 168 hours. No results for input(s): "PROBNP" in the last 8760 hours. Coagulation Profile: Recent Labs  Lab 03/12/22 0551  INR 1.1   Thyroid Function Tests: No results for input(s): "TSH", "T4TOTAL", "FREET4", "T3FREE", "THYROIDAB" in the last 72 hours. Lipid Profile: No results for input(s): "CHOL", "HDL", "LDLCALC", "TRIG", "CHOLHDL", "LDLDIRECT" in the last 72 hours. Anemia Panel: No results for input(s): "VITAMINB12", "FOLATE", "FERRITIN", "TIBC", "IRON", "RETICCTPCT" in the last 72 hours. Urine analysis:    Component Value Date/Time   COLORURINE YELLOW 12/30/2014 1820   APPEARANCEUR CLOUDY (A) 12/30/2014 1820   LABSPEC 1.013 12/30/2014 1820   PHURINE 5.5 12/30/2014 1820   GLUCOSEU NEGATIVE 12/30/2014 1820   HGBUR TRACE (A) 12/30/2014 1820   BILIRUBINUR NEGATIVE 12/30/2014 1820   KETONESUR NEGATIVE 12/30/2014 1820   PROTEINUR 100 (A) 12/30/2014 1820   UROBILINOGEN 0.2 12/30/2014 1820   NITRITE NEGATIVE 12/30/2014 1820   LEUKOCYTESUR LARGE (A) 12/30/2014 1820   Sepsis Labs: Invalid input(s): "PROCALCITONIN", "LACTICIDVEN"  Microbiology: Recent Results (from the past 240 hour(s))  Resp panel by RT-PCR (RSV, Flu A&B, Covid) Anterior Nasal Swab     Status: None   Collection Time: 03/02/22  9:19 PM   Specimen: Anterior Nasal Swab  Result Value Ref Range Status   SARS  Coronavirus 2 by RT PCR NEGATIVE NEGATIVE Final    Comment: (NOTE) SARS-CoV-2 target nucleic acids are NOT DETECTED.  The SARS-CoV-2 RNA is generally detectable in upper respiratory specimens during the acute phase of infection. The lowest concentration of SARS-CoV-2 viral copies this assay can detect is 138 copies/mL. A negative result does not preclude SARS-Cov-2 infection and should not be used as the sole basis for treatment or other patient management decisions. A negative result may occur with  improper specimen collection/handling, submission of specimen other than nasopharyngeal swab, presence of viral mutation(s) within the areas targeted by this assay, and inadequate number of viral copies(<138 copies/mL). A negative result must be combined with clinical observations, patient history, and epidemiological information. The expected result is Negative.  Fact Sheet for Patients:  EntrepreneurPulse.com.au  Fact Sheet for Healthcare Providers:  IncredibleEmployment.be  This test is no t yet approved or cleared by the [place] FDA and  has been authorized for detection and/or diagnosis of SARS-CoV-2 by FDA under an Emergency Use Authorization (EUA). This EUA will remain  in effect (meaning this test can be used) for the duration of the COVID-19 declaration under Section 564(b)(1) of the Act, 21 U.S.C.section 360bbb-3(b)(1), unless the authorization is terminated  or revoked sooner.       Influenza A by PCR NEGATIVE NEGATIVE Final   Influenza B by PCR NEGATIVE NEGATIVE Final    Comment: (NOTE) The Xpert Xpress SARS-CoV-2/FLU/RSV plus assay is intended as an aid in the diagnosis of influenza from Nasopharyngeal swab specimens and should not be used as a sole basis for treatment. Nasal washings and aspirates are unacceptable for Xpert Xpress SARS-CoV-2/FLU/RSV testing.  Fact Sheet for  Patients: EntrepreneurPulse.com.au  Fact Sheet for Healthcare Providers: IncredibleEmployment.be  This test is not yet approved or cleared by the [place] FDA and has been authorized for detection and/or diagnosis of SARS-CoV-2 by FDA under an Emergency Use Authorization (EUA). This EUA will remain in effect (meaning this test can be used) for the  duration of the COVID-19 declaration under Section 564(b)(1) of the Act, 21 U.S.C. section 360bbb-3(b)(1), unless the authorization is terminated or revoked.     Resp Syncytial Virus by PCR NEGATIVE NEGATIVE Final    Comment: (NOTE) Fact Sheet for Patients: EntrepreneurPulse.com.au  Fact Sheet for Healthcare Providers: IncredibleEmployment.be  This test is not yet approved or cleared by the [place] FDA and has been authorized for detection and/or diagnosis of SARS-CoV-2 by FDA under an Emergency Use Authorization (EUA). This EUA will remain in effect (meaning this test can be used) for the duration of the COVID-19 declaration under Section 564(b)(1) of the Act, 21 U.S.C. section 360bbb-3(b)(1), unless the authorization is terminated or revoked.  Performed at [facility], [address] [place], [place] [address]   Blood culture (routine x 2)     Status: None   Collection Time: 03/02/22 10:50 PM   Specimen: BLOOD  Result Value Ref Range Status   Specimen Description   Final    BLOOD RIGHT ANTECUBITAL Performed at [facility], [address] [place], [place] [address]    Special Requests   Final    BOTTLES DRAWN AEROBIC AND ANAEROBIC Blood Culture adequate volume Performed at [facility], [address] [place], [place] [address]    Culture   Final    NO GROWTH 5 DAYS Performed at [facility], [place] [address], [place], [place] [address]    Report Status 03/08/2022 FINAL  Final  Blood culture (routine  x 2)     Status: None   Collection Time: 03/02/22 10:54 PM   Specimen: BLOOD LEFT WRIST  Result Value Ref Range Status   Specimen Description   Final    BLOOD LEFT WRIST Performed at [facility], [address] [place], [place] [address]    Special Requests   Final    BOTTLES DRAWN AEROBIC AND ANAEROBIC Blood Culture adequate volume Performed at [facility], [address] [place], [place] [address]    Culture   Final    NO GROWTH 5 DAYS Performed at [facility], [place] [address] [place], [place] [address]    Report Status 03/08/2022 FINAL  Final  MRSA Next Gen by PCR, Nasal     Status: None   Collection Time: 03/03/22  4:28 PM   Specimen: Nasal Mucosa; Nasal Swab  Result Value Ref Range Status   MRSA by PCR Next Gen NOT DETECTED NOT DETECTED Final    Comment: (NOTE) The GeneXpert MRSA Assay (FDA approved for NASAL specimens only), is one component of a comprehensive MRSA colonization surveillance program. It is not intended to diagnose MRSA infection nor to guide or monitor treatment for MRSA infections. Test performance is not FDA approved in patients less than 41 years old. Performed at [facility], [place] [address], [place], [place] [address]   Respiratory (~20 pathogens) panel by PCR     Status: None   Collection Time: 03/03/22  4:31 PM   Specimen: Nasopharyngeal Swab; Respiratory  Result Value Ref Range Status   Adenovirus NOT DETECTED NOT DETECTED Final   Coronavirus 229E NOT DETECTED NOT DETECTED Final    Comment: (NOTE) The Coronavirus on the Respiratory Panel, DOES NOT test for the novel  Coronavirus (2019 nCoV)    Coronavirus HKU1 NOT DETECTED NOT DETECTED Final   Coronavirus NL63 NOT DETECTED NOT DETECTED Final   Coronavirus OC43 NOT  DETECTED NOT DETECTED Final   Metapneumovirus NOT DETECTED NOT DETECTED Final   Rhinovirus / Enterovirus NOT DETECTED NOT DETECTED Final   Influenza A NOT DETECTED NOT DETECTED Final    Influenza B NOT DETECTED NOT DETECTED Final   Parainfluenza Virus 1 NOT DETECTED NOT DETECTED Final   Parainfluenza Virus 2 NOT DETECTED NOT DETECTED Final   Parainfluenza Virus 3 NOT DETECTED NOT DETECTED Final   Parainfluenza Virus 4 NOT DETECTED NOT DETECTED Final   Respiratory Syncytial Virus NOT DETECTED NOT DETECTED Final   Bordetella pertussis NOT DETECTED NOT DETECTED Final   Bordetella Parapertussis NOT DETECTED NOT DETECTED Final   Chlamydophila pneumoniae NOT DETECTED NOT DETECTED Final   Mycoplasma pneumoniae NOT DETECTED NOT DETECTED Final    Comment: Performed at [facility], [place] [address] [place], [place] [address]  Culture, Respiratory w Gram Stain     Status: None   Collection Time: 03/03/22  6:04 PM   Specimen: Bronchoalveolar Lavage; Respiratory  Result Value Ref Range Status   Specimen Description BRONCHIAL ALVEOLAR LAVAGE  Final   Special Requests NONE  Final   Gram Stain RARE YEAST NO WBC SEEN   Final   Culture   Final    RARE Normal respiratory flora-no Staph aureus or Pseudomonas seen Performed at [facility], [address], [place], [place] [address]    Report Status 03/06/2022 FINAL  Final  MRSA Next Gen by PCR, Nasal     Status: None   Collection Time: 03/07/22  4:04 AM   Specimen: Nasal Mucosa; Nasal Swab  Result Value Ref Range Status   MRSA by PCR Next Gen NOT DETECTED NOT DETECTED Final    Comment: (NOTE) The GeneXpert MRSA Assay (FDA approved for NASAL specimens only), is one component of a comprehensive MRSA colonization surveillance program. It is not intended to diagnose MRSA infection nor to guide or monitor treatment for MRSA infections. Test performance is not FDA approved in patients less than 27 years old. Performed at [facility], [place] [address], [place], [place] [address]     Radiology Studies: CARDIAC CATHETERIZATION  Result Date: 03/12/2022 Conclusions: Multivessel coronary artery disease, as detailed  below.  Most severe lesion is an 80-90% mid LAD stenosis.  There is also moderate to severe disease involving small branches (D1 and rPDA) as well as moderate stenosis affecting distal LCx and rPLAV. Moderately elevated left ventricular filling pressure (LVEDP 25-30 mmHg). Aborted attempt at PCI of the mid LAD due to inability to achieve therapeutic anticoagulation despite administration of 16,000 unit of IV heparin. Recommendations: Consider PCI to mid LAD as soon as tomorrow using alternative intraprocedural anticoagulation strategy (i.e. bivalirudin). Dual antiplatelet therapy with aspirin and clopidogrel for at least 12 months. Consider escalation of fluid removal with peritoneal dialysis in the setting of moderately elevated LVEDP. Aggressive secondary prevention of coronary artery disease. Nelva Bush, MD [facility]     Dhana Totton T. [place]  If 7PM-7AM, please contact night-coverage [email or web address] 03/12/2022, 3:19 PM

## 2022-03-12 NOTE — Progress Notes (Signed)
Progress Note  Patient Name: William [name] Date of Encounter: 03/12/2022  Primary Cardiologist: Janina Mayo, MD  Subjective   NAEO SVT He is awake and alert, He can answer questions  and follow directions. He understands he is planned for LHC.  Inpatient Medications    Scheduled Meds:  sodium chloride   Intravenous Once   aspirin  81 mg Oral Daily   atorvastatin  20 mg Oral Daily   carvedilol  3.125 mg Oral BID WC   Chlorhexidine Gluconate Cloth  6 each Topical Daily   darbepoetin (ARANESP) injection - NON-DIALYSIS  200 mcg Subcutaneous Q Mon-1800   gentamicin cream  1 Application Topical Daily   guaiFENesin  600 mg Oral BID   heparin injection (subcutaneous)  5,000 Units Subcutaneous Q8H   insulin aspart  1-3 Units Subcutaneous Q4H   insulin detemir  5 Units Subcutaneous Q12H   melatonin  3 mg Oral QHS   pantoprazole  40 mg Oral Daily   potassium chloride  40 mEq Oral BID   QUEtiapine  50 mg Oral QHS   sevelamer carbonate  800 mg Oral TID WC   sodium chloride flush  3 mL Intravenous Q12H   Continuous Infusions:  sodium chloride Stopped (03/04/22 1031)   sodium chloride     dialysis solution 2.5% low-MG/low-CA     PRN Meds: sodium chloride, sodium chloride, acetaminophen **OR** acetaminophen, calcium carbonate, guaiFENesin-dextromethorphan, haloperidol lactate, ipratropium-albuterol, metoprolol tartrate, mouth rinse, [COMPLETED] polyethylene glycol **FOLLOWED BY** polyethylene glycol, prochlorperazine, sodium chloride flush, white petrolatum   Vital Signs    Vitals:   03/12/22 0000 03/12/22 0350 03/12/22 0742 03/12/22 0806  BP: 113/60 (!) 124/95 127/87 125/80  Pulse: 79 83 82 (!) 122  Resp: '18 20 18 17  '$ Temp: 98.3 F (36.8 C) 97.6 F (36.4 C) 97.6 F (36.4 C) (!) 97.5 F (36.4 C)  TempSrc: Oral Oral Oral Oral  SpO2: 94% 97% 96% 94%  Weight:      Height:        Intake/Output Summary (Last 24 hours) at 03/12/2022 0846 Last data filed at 03/11/2022  1700 Gross per 24 hour  Intake 360 ml  Output 1 ml  Net 359 ml   Filed Weights   03/07/22 1728 03/08/22 0657 03/09/22 0733  Weight: 83.9 kg 84.2 kg 83.6 kg    Telemetry     Personally reviewed, HR controlled but he continues to have paroxysms of possible atrial tachycardia.  ECG    Diffuse ST segment depressions except in V1 and aVR where there is ST elevation 1/7  Physical Exam   GEN: No acute distress.  A&Ox3 Neck: No JVD. Cardiac: RRR, no murmur, rub, or gallop.  Respiratory: Nonlabored. Clear to auscultation bilaterally. GI: Soft, nontender, bowel sounds present. MS: No edema; No deformity. Neuro:  Nonfocal. Psych: Alert and oriented x 3. Normal affect.  Labs    Chemistry Recent Labs  Lab 03/08/22 1520 03/09/22 0731 03/10/22 0057 03/11/22 0052 03/12/22 0023  NA 135   < > 139 140 138  K 3.7   < > 3.2* 3.1* 4.2  CL 96*   < > 101 100 101  CO2 21*   < > '23 24 24  '$ GLUCOSE 129*   < > 146* 140* 129*  BUN 76*   < > 72* 64* 64*  CREATININE 6.89*   < > 7.15* 6.68* 6.90*  CALCIUM 8.3*   < > 8.7* 8.8* 8.4*  PROT 6.1*  --   --   --   --  ALBUMIN 2.8*  --   --  2.6* 2.5*  AST 32  --   --   --   --   ALT 31  --   --   --   --   ALKPHOS 49  --   --   --   --   BILITOT 0.6  --   --   --   --   GFRNONAA 8*   < > 7* 8* 7*  ANIONGAP 18*   < > 15 16* 13   < > = values in this interval not displayed.     Hematology Recent Labs  Lab 03/10/22 0057 03/11/22 0052 03/12/22 0023  WBC 12.8* 12.6* 13.5*  RBC 3.13* 3.33* 3.37*  HGB 10.5* 11.1* 11.1*  HCT 30.8* 33.3* 34.1*  MCV 98.4 100.0 101.2*  MCH 33.5 33.3 32.9  MCHC 34.1 33.3 32.6  RDW 13.3 13.1 13.2  PLT 340 318 309    Cardiac Enzymes Recent Labs  Lab 03/08/22 0553 03/08/22 0756 03/08/22 0917 03/08/22 1520 03/08/22 1850  TROPONINIHS 2,478* 2,695* 2,560* 1,534* 1,415*    BNPNo results for input(s): "BNP", "PROBNP" in the last 168 hours.   DDimerNo results for input(s): "DDIMER" in the last 168 hours.    Radiology    No results found.  Cardiac Studies  Echo from 03/04/2022 LVEF 40 to 42% Grade 1 diastolic dysfunction RV systolic function is mildly reduced Mild MR Mild AI   Assessment & Plan    Patient is 82 year old M known to have ESRD on PD, HTN, hypothyroidism, HLD, mild MR presented with acute hypoxic respiratory failure secondary to significant pneumonia and NSTEMI. He is stable  # NSTEMI, likely type II in the setting of pneumonia but type I cannot be ruled out completely -Patient's troponins initially were 800 - 900s but peaked to 2695 during this hospitalization. EKG showed diffuse ST segment depressions except in aVR and V1 that has ST elevation. Echo showed LVEF 40 to 45% with no RWMA. Patient denied having chest pain or SOB but did have SOB prior to admission possibly from pneumonia. He completed antibiotic course for pneumonia and his confusion resolved. Hemoglobin has been stable and no bleeding episodes ever in his life. Patient will benefit from invasive ischemia evaluation with LHC prior to discharge.Risks and benefits of cardiac catheterization have been discussed with the patient.  These include bleeding, infection, kidney damage, stroke, heart attack, death. The patient understands these risks and is willing to proceed. -Patient is posted for [facility] on 03/12/2022. Keep n.p.o. after midnight.  CBC, CMP, INR in AM.  He is DNR but agreed to convert his CODE STATUS to full code for the LHC procedure. -Continue aspirin 81 mg once daily -Continue atorvastatin 20 mg nightly -Switch carvedilol to metoprolol 25 mg twice daily  # Brief paroxysms of possible atrial tachycardia/SVT -Switch carvedilol to metoprolol 25 mg twice daily -2-week event monitor upon discharge was planned prior -OSA evaluation outpatient  # Acute hypoxic respiratory failure secondary to pneumonia, extubated on 03/05/2022: Patient pleated antibiotic course for pneumonia.  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  Patient did not have any bleeding episodes in his life. Hemoglobin was 7.9 in the current admission (which could be secondary to sepsis and anemia of chronic disease) for which he received PRBC transfusion. Hemoglobin stable today at 11.1.  # HFrEF, LVEF 40 to 45%, rule out CAD: Switch carvedilol to metoprolol 25 mg twice daily. Euvolemic  #  HLD: Not on therapy prior to admission.  LDL 122 in 01/2022.  Repeat lipid panel with LDL at 94.  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 Atorvastatin 20 mg nightly is initiated during this hospitalization.    Time Spent Directly with Patient:  I have spent a total of 35 minutes with the patient reviewing hospital notes, telemetry, EKGs, labs and examining the patient as well as establishing an assessment and plan that was discussed personally with the patient.  > 50% of time was spent in direct patient care.   Signed, Janina Mayo, MD  03/12/2022, 8:46 AM

## 2022-03-12 NOTE — Procedures (Signed)
PD Note  Patient connected without incident.  Dressing changed with gentamicin ointment.  No signs or symptoms of infection at the insertion site.

## 2022-03-12 NOTE — Progress Notes (Signed)
   03/11/22 2051  Peritoneal Catheter Right lower abdomen  No placement date or time found.   Catheter Location: Right lower abdomen  Site Assessment Clean, Dry, Intact  Drainage Description None  Catheter status Accessed  Dressing Gauze/Drain sponge  Dressing Status Clean, Dry, Intact  Dressing Intervention New dressing  Completion  Treatment Status Started  Hand-Off documentation  Handoff Given Given to shift RN/LPN  Report given to (Full Name) Wray Kearns, RN  Handoff Received Received from shift RN/LPN  Report received from (Full Name) Nica Friske, RN

## 2022-03-12 NOTE — Progress Notes (Signed)
   03/12/22 1000  Mobility  Activity Transferred to/from [facility]  Level of Assistance Minimal assist, patient does 75% or more  Assistive Device  (HHA)  Distance Ambulated (ft) 2 ft  Activity Response Tolerated well  Mobility Referral Yes  $Mobility charge 1 Mobility   Mobility Specialist Progress Note  Pt was EOB requesting to use [facility] for BM. Had no c/o pain. Left on [facility] w/ call bell in reach and RN in room.   Lucious Groves Mobility Specialist  Please contact via SecureChat or Rehab office at [phone]

## 2022-03-12 NOTE — Progress Notes (Signed)
SLP Cancellation Note  Patient Details Name: William [name] MRN: [id number] DOB: 1940-03-15   Cancelled treatment:        Pt NPO for cath. Will continue efforts    Houston Siren 03/12/2022, 8:45 AM

## 2022-03-13 ENCOUNTER — Ambulatory Visit (HOSPITAL_COMMUNITY): Admission: RE | Admit: 2022-03-13 | Payer: PPO | Source: Home / Self Care | Admitting: Cardiology

## 2022-03-13 ENCOUNTER — Encounter (HOSPITAL_COMMUNITY): Payer: Self-pay | Admitting: Internal Medicine

## 2022-03-13 ENCOUNTER — Encounter (HOSPITAL_COMMUNITY)
Admission: EM | Disposition: A | Discharge status: Home / Self Care | Payer: Self-pay | Source: Home / Self Care | Attending: Student

## 2022-03-13 ENCOUNTER — Inpatient Hospital Stay (HOSPITAL_COMMUNITY): Payer: PPO

## 2022-03-13 DIAGNOSIS — E8721 Acute metabolic acidosis: Secondary | ICD-10-CM | POA: Diagnosis not present

## 2022-03-13 DIAGNOSIS — J9601 Acute respiratory failure with hypoxia: Secondary | ICD-10-CM | POA: Diagnosis not present

## 2022-03-13 DIAGNOSIS — R410 Disorientation, unspecified: Secondary | ICD-10-CM | POA: Diagnosis not present

## 2022-03-13 DIAGNOSIS — Z7189 Other specified counseling: Secondary | ICD-10-CM | POA: Diagnosis not present

## 2022-03-13 LAB — RENAL FUNCTION PANEL
Albumin: 2.8 g/dL — ABNORMAL LOW (ref 3.5–5.0)
Anion gap: 14 (ref 5–15)
BUN: 63 mg/dL — ABNORMAL HIGH (ref 8–23)
CO2: 26 mmol/L (ref 22–32)
Calcium: 9.3 mg/dL (ref 8.9–10.3)
Chloride: 102 mmol/L (ref 98–111)
Creatinine, Ser: 7.16 mg/dL — ABNORMAL HIGH (ref 0.61–1.24)
GFR, Estimated: 7 mL/min — ABNORMAL LOW (ref >60)
Glucose, Bld: 181 mg/dL — ABNORMAL HIGH (ref 70–99)
Phosphorus: 5.7 mg/dL — ABNORMAL HIGH (ref 2.5–4.6)
Potassium: 5.1 mmol/L (ref 3.5–5.1)
Sodium: 142 mmol/L (ref 135–145)

## 2022-03-13 LAB — CBC
HCT: 36.3 % — ABNORMAL LOW (ref 39.0–52.0)
Hemoglobin: 11.7 g/dL — ABNORMAL LOW (ref 13.0–17.0)
MCH: 33.2 pg (ref 26.0–34.0)
MCHC: 32.2 g/dL (ref 30.0–36.0)
MCV: 103.1 fL — ABNORMAL HIGH (ref 80.0–100.0)
Platelets: 305 10*3/uL (ref 150–400)
RBC: 3.52 MIL/uL — ABNORMAL LOW (ref 4.22–5.81)
RDW: 13.5 % (ref 11.5–15.5)
WBC: 12.6 10*3/uL — ABNORMAL HIGH (ref 4.0–10.5)
nRBC: 1 % — ABNORMAL HIGH (ref 0.0–0.2)

## 2022-03-13 LAB — GLUCOSE, CAPILLARY
Glucose-Capillary: 207 mg/dL — ABNORMAL HIGH (ref 70–99)
Glucose-Capillary: 192 mg/dL — ABNORMAL HIGH (ref 70–99)
Glucose-Capillary: 180 mg/dL — ABNORMAL HIGH (ref 70–99)
Glucose-Capillary: 113 mg/dL — ABNORMAL HIGH (ref 70–99)
Glucose-Capillary: 152 mg/dL — ABNORMAL HIGH (ref 70–99)
Glucose-Capillary: 255 mg/dL — ABNORMAL HIGH (ref 70–99)

## 2022-03-13 LAB — MAGNESIUM: Magnesium: 2.4 mg/dL (ref 1.7–2.4)

## 2022-03-13 SURGERY — CORONARY STENT INTERVENTION
Anesthesia: LOCAL

## 2022-03-13 MED ORDER — DELFLEX-LC/4.25% DEXTROSE 483 MOSM/L IP SOLN: INTRAPERITONEAL | Status: DC

## 2022-03-13 MED ORDER — HEPARIN (PORCINE) IN NACL 1000-0.9 UT/500ML-% IV SOLN
INTRAVENOUS | Status: AC
  Filled 2022-03-13: qty 1000

## 2022-03-13 MED ORDER — LIDOCAINE HCL (PF) 1 % IJ SOLN
INTRAMUSCULAR | Status: AC
  Filled 2022-03-13: qty 30

## 2022-03-13 MED ORDER — SODIUM CHLORIDE 0.9 % IV SOLN: INTRAVENOUS | Status: DC

## 2022-03-13 MED ORDER — ASPIRIN 81 MG PO CHEW
81.0000 mg | CHEWABLE_TABLET | ORAL | Status: AC
  Administered 2022-03-13: 81 mg via ORAL
  Filled 2022-03-13: qty 1

## 2022-03-13 MED ORDER — DARBEPOETIN ALFA 100 MCG/0.5ML IJ SOSY: 100.0000 ug | PREFILLED_SYRINGE | INTRAMUSCULAR | Status: DC

## 2022-03-13 MED ORDER — QUETIAPINE FUMARATE 25 MG PO TABS
25.0000 mg | ORAL_TABLET | Freq: Every day | ORAL | Status: DC
  Administered 2022-03-13 – 2022-03-14 (×2): 25 mg via ORAL
  Filled 2022-03-13 (×2): qty 1

## 2022-03-13 MED FILL — Nitroglycerin IV Soln 100 MCG/ML in D5W: INTRA_ARTERIAL | Qty: 10 | Status: AC

## 2022-03-13 MED FILL — Verapamil HCl IV Soln 2.5 MG/ML: INTRAVENOUS | Qty: 2 | Status: AC

## 2022-03-13 NOTE — Interval H&P Note (Signed)
History and Physical Interval Note:  03/13/2022 2:01 PM  William [name]  has presented today for surgery, with the diagnosis of cad.  The various methods of treatment have been discussed with the patient and family. After consideration of risks, benefits and other options for treatment, the patient has consented to  Procedure(s): CORONARY STENT INTERVENTION (N/A) as a surgical intervention.  The patient's history has been reviewed, patient examined, no change in status, stable for surgery.  I have reviewed the patient's chart and labs.  Questions were answered to the patient's satisfaction.    Cath Lab Visit (complete for each Cath Lab visit)  Clinical Evaluation Leading to the Procedure:   ACS: Yes.    Non-ACS:    Anginal Classification: CCS IV  Anti-ischemic medical therapy: Minimal Therapy (1 class of medications)  Non-Invasive Test Results: No non-invasive testing performed  Prior CABG: Previous CABG       William [name] [facility] 03/13/2022 2:01 PM

## 2022-03-13 NOTE — Progress Notes (Signed)
SLP Cancellation Note  Patient Details Name: William [name] MRN: [id number] DOB: 07-08-1940   Cancelled treatment:        Heart cath not done yesterday and is scheduled for today- will follow up and see for swallow when able and not NPO.   Houston Siren 03/13/2022, 8:31 AM

## 2022-03-13 NOTE — Progress Notes (Signed)
PT Cancellation Note  Patient Details Name: Xachary Hambly MRN: [id number] DOB: 1940/07/20   Cancelled Treatment:    Reason Eval/Treat Not Completed: Patient at procedure or test/unavailable. Pt currently still on PD. Awaiting dialysis RN to disconnect for transport to OR for PCI. Transport has already arrived to take pt. PT to follow up tomorrow.    Lorriane Shire 03/13/2022, 10:44 AM

## 2022-03-13 NOTE — Progress Notes (Signed)
Mobility Specialist - Progress Note   03/13/22 1216  Mobility  Activity Transferred from bed to chair  Level of Assistance Minimal assist, patient does 75% or more  Assistive Device Front wheel walker  Activity Response Tolerated well  Mobility Referral Yes  $Mobility charge 1 Mobility   Pt was received in bed and agreeable to transfer. No complaints throughout. Pt was left in chair with all needs met.   Franki Monte  Mobility Specialist Please contact via Solicitor or Rehab office at [phone]

## 2022-03-13 NOTE — Progress Notes (Signed)
Patient arrived to the cath lab for PCI procedure. He insists he needs to see his wife before he would proceed. We had wife on the phone and she encouraged him to proceed but he refused. He is not really able to have procedure as he is not fully oriented and cooperative. Will return patient to room.  Ledora Delker Martinique MD, [facility]

## 2022-03-13 NOTE — Progress Notes (Addendum)
Patient returned from cath lab to room. Refusing care; medications, foods, drinks, and ordered CXR. Adamant at not receiving care from nursing staff until talking to his wife and "want to leave" Daughter Anderson Malta at the bedside unable to reassure and convince father to eat or drinks. Dr Cyndia Skeeters paged notified and acknowledged. Leveda Anna, BSN, RN

## 2022-03-13 NOTE — Progress Notes (Signed)
PROGRESS NOTE  William [name] HRC:[id number] DOB: 10/13/1940   PCP: [name], PA-C  Patient is from: Home.  DOA: 03/02/2022 LOS: 54  Chief complaints Chief Complaint  Patient presents with   Emesis   Shortness of Breath     Brief Narrative / Interim history: 82 year old man with hx of combined CHF, ESRD on PD, HTN, HLD and BPH presenting with with productive cough, dyspnea on exertion and subjective fever and admitted to ICU for acute respiratory failure with hypoxia in the setting of multifocal pneumonia.  He was intubated from 12/29-1/1.  Started on IV ceftriaxone and azithromycin on 12/29, and vancomycin on 12/30.  He was transferred out of ICU on 1/2 but developed increasing agitation, confusion the night of 1/3 and started on Precedex drip and sent back to ICU.  EKG on 1/4 with increased ST/T changes in inferolateral leads.  He also had significantly elevated troponin, 800>>> 2700 (peak).  TTE with LVEF of 40 to 45% and G1-DD.  Cardiology consulted.   Eventually, delirium improved, came off Precedex drip and transferred to [facility] hospitalist service on 03/10/2021.  Completed antibiotic course on 03/10/2021.  Respiratory failure resolved.  Cardiology and nephrology following.    LHC on 1/8 with multivessel CAD with most severe lesion in mid LAD with 80 to 90% stenosis.  Unfortunately, PCI aborted due to inability to achieve therapeutic anticoagulation despite administration of IV heparin.  Plan was to reattempt PCI to mid LAD using intraprocedural bivalirudin on 1/9 but patient refused procedure after he went down for cath likely due to delirium.  Cardiology recommended palliative medicine consult.  Subjective: Seen and examined earlier this morning.  No major events overnight of this morning.  He looks somewhat lethargic.  Has no complaints.  He denies chest pain or shortness of breath.  Patient's daughter at bedside.  Objective: Vitals:   03/13/22 0420 03/13/22 0815  03/13/22 1044 03/13/22 1220  BP: 106/61 105/71 (!) 112/94 114/65  Pulse: 79 73 97 75  Resp:    18  Temp:  97.6 F (36.4 C) (S) 97.7 F (36.5 C) (!) 97.2 F (36.2 C)  TempSrc:  Oral Oral Oral  SpO2: 100% 96% 100% 97%  Weight:   76.2 kg   Height:        Examination:  GENERAL: Appears frail and chronically ill. HEENT: MMM.  Vision and hearing grossly intact.  NECK: Supple.  No apparent JVD.  RESP:  No IWOB.  Fair aeration bilaterally. CVS:  RRR. Heart sounds normal.  ABD/GI/GU: BS+. Abd soft, NTND.  MSK/EXT:  Moves extremities. No apparent deformity. No edema.  SKIN: no apparent skin lesion or wound NEURO: Awake but not quite alert.  Fairly oriented.  No apparent focal neuro deficit. PSYCH: Calm. Normal affect.   Procedures:  12/29-1/1 intubation and mechanical ventilation  Microbiology summarized: COVID-19, influenza and RSV PCR nonreactive. MRSA PCR screen negative. Blood cultures NGTD  Assessment and plan: Principal Problem:   Acute respiratory failure with hypoxia (HCC) Active Problems:   Essential hypertension   ESRD (end stage renal disease) (HCC)   Multifocal pneumonia   Anemia in chronic kidney disease (CKD)   Acute metabolic acidosis   Delirium   Acute on chronic combined systolic and diastolic CHF (congestive heart failure) (HCC)   Non-STEMI (non-ST elevated myocardial infarction) (HCC)   Goals of care, counseling/discussion  Acute hypoxemic respiratory failure due to multifocal pneumonia: CT chest, abdomen and pelvis with patchy airspace disease in both lungs suggesting multifocal  pneumonia, moderate right effusion and prominent mediastinal lymph nodes.  Culture data as above.  Respiratory failure resolved.  Completed antibiotic and steroid course. -Pulmonary toilet with incentive spirometry -Mucolytic's and antitussives.  Agitation/delirium/insomnia: Required Precedex in ICU.  Had significant interval improvement before today (1/9).  -Decrease Seroquel  to 25 mg nightly. -IV Haldol 1 mg every 6 hours as needed -Monitor QTc, 458 on 1/5. -Optimize electrolytes -Reorientation and delirium precaution -Palliative medicine consulted.   Non-STEMI/multivessel CAD: EKG EKG showed diffuse STD except in aVR and V1 that has ST elevation. Echo showed LVEF 40 to 45%. Troponin trended from [phone] (peak).  Patient without chest pain. LHC on 1/8 with multivessel CAD with most severe lesion in mid LAD with 80 to 90% stenosis.  Unfortunately, PCI aborted on 1/8 due to inability to achieve therapeutic anticoagulation with IV heparin.  Plan was to reattempt PCI to mid LAD on 1/9 using  intraprocedural bivalirudin but patient refused procedure after he went down to Cath Lab. -Cardiology recommended palliative medicine consult before proceeding with procedure -Continue low-dose aspirin, Lipitor and Coreg  Acute combined CHF: TTE with LVEF of 40 to 45% and G1 DD.  Appears euvolemic as of now. -Fluid management by peritoneal dialysis per nephrology.  ESRD on peritoneal dialysis. Bone mineral disorder/hyperphosphatemia/hypocalcemia/hyponatremia -Peritoneal dialysis per nephrology  Anemia of renal disease: Transfused 1 unit on 1/4 with appropriate response.  No signs of bleeding.  H&H stable and better than baseline. Recent Labs    03/05/22 0416 03/06/22 0313 03/07/22 0043 03/08/22 0553 03/08/22 1520 03/09/22 0731 03/10/22 0057 03/11/22 0052 03/12/22 0023 03/13/22 0719  HGB 8.0* 8.5* 9.6* 7.9* 9.8* 9.6* 10.5* 11.1* 11.1* 11.7*  -Continue monitoring  Essential hypertension: Normotensive. -Cardiac meds as above  Generalized weakness/physical deconditioning -PT/OT-recommended HH PT/OT.  Hypokalemia: Resolved.  Constipation: Resolved. -Bowel regimen as needed  Coughing/gagging while eating: Improving. -SLP recommend regular diet with aspiration precaution.  Goal of care: Patient is DNR/DNI.  He has delirium, multivessel CAD, combined CHF, ESRD.   He is physically deconditioned.  Overall prognosis is poor.  Refused essential procedure today after he went down to Cath Lab.  - -Palliative medicine consulted.  Body mass index is 22.78 kg/m.          DVT prophylaxis:  heparin injection 5,000 Units Start: 03/12/22 2200  Code Status: DNR/DNI Family Communication: Updated patient's wife at bedside. Level of care: Progressive Cardiac Status is: Inpatient Remains inpatient appropriate because: Multivessel CAD, delirium   Final disposition: TBD Consultants:  Pulmonology admitted patient Nephrology Cardiology  Sch Meds:  Scheduled Meds:  sodium chloride   Intravenous Once   aspirin  81 mg Oral Daily   atorvastatin  40 mg Oral Daily   carvedilol  3.125 mg Oral BID WC   Chlorhexidine Gluconate Cloth  6 each Topical Daily   clopidogrel  75 mg Oral Daily   [START ON 03/19/2022] darbepoetin (ARANESP) injection - NON-DIALYSIS  100 mcg Subcutaneous Q Mon-1800   gentamicin cream   Topical Daily   guaiFENesin  600 mg Oral BID   heparin  5,000 Units Subcutaneous Q8H   insulin aspart  1-3 Units Subcutaneous Q4H   insulin detemir  5 Units Subcutaneous Q12H   melatonin  3 mg Oral QHS   pantoprazole  40 mg Oral Daily   QUEtiapine  25 mg Oral QHS   sevelamer carbonate  800 mg Oral TID WC   sodium chloride flush  3 mL Intravenous Q12H   sodium chloride flush  3  mL Intravenous Q12H   Continuous Infusions:  sodium chloride 10 mL/hr at 03/12/22 1057   sodium chloride     sodium chloride 10 mL/hr at 03/13/22 0651   dialysis solution 4.25% low-MG/low-CA     dialysis solution 4.25% low-MG/low-CA     PRN Meds:.sodium chloride, sodium chloride, acetaminophen **OR** acetaminophen, calcium carbonate, guaiFENesin-dextromethorphan, haloperidol lactate, ipratropium-albuterol, metoprolol tartrate, mouth rinse, [COMPLETED] polyethylene glycol **FOLLOWED BY** polyethylene glycol, prochlorperazine, sodium chloride flush, white  petrolatum  Antimicrobials: Anti-infectives (From admission, onward)    Start     Dose/Rate Route Frequency Ordered Stop   03/03/22 2200  cefTRIAXone (ROCEPHIN) 2 g in sodium chloride 0.9 % 100 mL IVPB        2 g 200 mL/hr over 30 Minutes Intravenous Every 24 hours 03/03/22 1546 03/10/22 0028   03/03/22 2000  azithromycin (ZITHROMAX) 500 mg in sodium chloride 0.9 % 250 mL IVPB        500 mg 250 mL/hr over 60 Minutes Intravenous Every 24 hours 03/03/22 1546 03/06/22 2253   03/03/22 1700  vancomycin (VANCOREADY) IVPB 2000 mg/400 mL        2,000 mg 200 mL/hr over 120 Minutes Intravenous NOW 03/03/22 1608 03/04/22 1011   03/03/22 1611  vancomycin variable dose per unstable renal function (pharmacist dosing)  Status:  Discontinued         Does not apply See admin instructions 03/03/22 1611 03/06/22 0838   03/02/22 2300  cefTRIAXone (ROCEPHIN) 1 g in sodium chloride 0.9 % 100 mL IVPB        1 g 200 mL/hr over 30 Minutes Intravenous  Once 03/02/22 2245 03/02/22 2345   03/02/22 2300  azithromycin (ZITHROMAX) 500 mg in sodium chloride 0.9 % 250 mL IVPB        500 mg 250 mL/hr over 60 Minutes Intravenous  Once 03/02/22 2245 03/03/22 0108        I have personally reviewed the following labs and images: CBC: Recent Labs  Lab 03/09/22 0731 03/10/22 0057 03/11/22 0052 03/12/22 0023 03/13/22 0719  WBC 13.2* 12.8* 12.6* 13.5* 12.6*  NEUTROABS  --  10.7*  --   --   --   HGB 9.6* 10.5* 11.1* 11.1* 11.7*  HCT 29.5* 30.8* 33.3* 34.1* 36.3*  MCV 101.0* 98.4 100.0 101.2* 103.1*  PLT 303 340 318 309 305   BMP &GFR Recent Labs  Lab 03/08/22 0553 03/08/22 1520 03/09/22 0731 03/10/22 0057 03/11/22 0052 03/12/22 0023 03/13/22 0719  NA 135 135 140 139 140 138 142  K 3.5 3.7 3.5 3.2* 3.1* 4.2 5.1  CL 98 96* 102 101 100 101 102  CO2 22 21* 21* _0 GLUCOSE 111* 129* 112* 146* 140* 129* 181*  BUN 71* 76* 73* 72* 64* 64* 63*  CREATININE 6.51* 6.89* 6.70* 7.15* 6.68* 6.90* 7.16*   CALCIUM 7.9* 8.3* 8.5* 8.7* 8.8* 8.4* 9.3  MG 2.2 2.2  --   --  2.1 2.1 2.4  PHOS 7.1* 6.9*  --   --  5.0* 4.3 5.7*   Estimated Creatinine Clearance: 8.9 mL/min (A) (by C-G formula based on SCr of 7.16 mg/dL (H)). Liver & Pancreas: Recent Labs  Lab 03/08/22 1520 03/11/22 0052 03/12/22 0023 03/13/22 0719  AST 32  --   --   --   ALT 31  --   --   --   ALKPHOS 49  --   --   --   BILITOT 0.6  --   --   --  PROT 6.1*  --   --   --   ALBUMIN 2.8* 2.6* 2.5* 2.8*   No results for input(s): "LIPASE", "AMYLASE" in the last 168 hours. No results for input(s): "AMMONIA" in the last 168 hours. Diabetic: No results for input(s): "HGBA1C" in the last 72 hours. Recent Labs  Lab 03/12/22 2239 03/13/22 0147 03/13/22 0439 03/13/22 0752 03/13/22 1224  GLUCAP 221* 207* 192* 180* 113*   Cardiac Enzymes: No results for input(s): "CKTOTAL", "CKMB", "CKMBINDEX", "TROPONINI" in the last 168 hours. No results for input(s): "PROBNP" in the last 8760 hours. Coagulation Profile: Recent Labs  Lab 03/12/22 0551  INR 1.1   Thyroid Function Tests: No results for input(s): "TSH", "T4TOTAL", "FREET4", "T3FREE", "THYROIDAB" in the last 72 hours. Lipid Profile: No results for input(s): "CHOL", "HDL", "LDLCALC", "TRIG", "CHOLHDL", "LDLDIRECT" in the last 72 hours. Anemia Panel: No results for input(s): "VITAMINB12", "FOLATE", "FERRITIN", "TIBC", "IRON", "RETICCTPCT" in the last 72 hours. Urine analysis:    Component Value Date/Time   COLORURINE YELLOW 12/30/2014 1820   APPEARANCEUR CLOUDY (A) 12/30/2014 1820   LABSPEC 1.013 12/30/2014 1820   PHURINE 5.5 12/30/2014 1820   GLUCOSEU NEGATIVE 12/30/2014 1820   HGBUR TRACE (A) 12/30/2014 1820   BILIRUBINUR NEGATIVE 12/30/2014 1820   KETONESUR NEGATIVE 12/30/2014 1820   PROTEINUR 100 (A) 12/30/2014 1820   UROBILINOGEN 0.2 12/30/2014 1820   NITRITE NEGATIVE 12/30/2014 1820   LEUKOCYTESUR LARGE (A) 12/30/2014 1820   Sepsis Labs: Invalid input(s):  "PROCALCITONIN", "LACTICIDVEN"  Microbiology: Recent Results (from the past 240 hour(s))  MRSA Next Gen by PCR, Nasal     Status: None   Collection Time: 03/03/22  4:28 PM   Specimen: Nasal Mucosa; Nasal Swab  Result Value Ref Range Status   MRSA by PCR Next Gen NOT DETECTED NOT DETECTED Final    Comment: (NOTE) The GeneXpert MRSA Assay (FDA approved for NASAL specimens only), is one component of a comprehensive MRSA colonization surveillance program. It is not intended to diagnose MRSA infection nor to guide or monitor treatment for MRSA infections. Test performance is not FDA approved in patients less than 1 years old. Performed at [facility], [place] [address], [place], [place] [address]   Respiratory (~20 pathogens) panel by PCR     Status: None   Collection Time: 03/03/22  4:31 PM   Specimen: Nasopharyngeal Swab; Respiratory  Result Value Ref Range Status   Adenovirus NOT DETECTED NOT DETECTED Final   Coronavirus 229E NOT DETECTED NOT DETECTED Final    Comment: (NOTE) The Coronavirus on the Respiratory Panel, DOES NOT test for the novel  Coronavirus (2019 nCoV)    Coronavirus HKU1 NOT DETECTED NOT DETECTED Final   Coronavirus NL63 NOT DETECTED NOT DETECTED Final   Coronavirus OC43 NOT DETECTED NOT DETECTED Final   Metapneumovirus NOT DETECTED NOT DETECTED Final   Rhinovirus / Enterovirus NOT DETECTED NOT DETECTED Final   Influenza A NOT DETECTED NOT DETECTED Final   Influenza B NOT DETECTED NOT DETECTED Final   Parainfluenza Virus 1 NOT DETECTED NOT DETECTED Final   Parainfluenza Virus 2 NOT DETECTED NOT DETECTED Final   Parainfluenza Virus 3 NOT DETECTED NOT DETECTED Final   Parainfluenza Virus 4 NOT DETECTED NOT DETECTED Final   Respiratory Syncytial Virus NOT DETECTED NOT DETECTED Final   Bordetella pertussis NOT DETECTED NOT DETECTED Final   Bordetella Parapertussis NOT DETECTED NOT DETECTED Final   Chlamydophila pneumoniae NOT DETECTED NOT DETECTED Final    Mycoplasma pneumoniae NOT DETECTED NOT  DETECTED Final    Comment: Performed at [facility], [place] [address] [place], [place] [address]  Culture, Respiratory w Gram Stain     Status: None   Collection Time: 03/03/22  6:04 PM   Specimen: Bronchoalveolar Lavage; Respiratory  Result Value Ref Range Status   Specimen Description BRONCHIAL ALVEOLAR LAVAGE  Final   Special Requests NONE  Final   Gram Stain RARE YEAST NO WBC SEEN   Final   Culture   Final    RARE Normal respiratory flora-no Staph aureus or Pseudomonas seen Performed at [facility], [address] [place], [place] [address]    Report Status 03/06/2022 FINAL  Final  MRSA Next Gen by PCR, Nasal     Status: None   Collection Time: 03/07/22  4:04 AM   Specimen: Nasal Mucosa; Nasal Swab  Result Value Ref Range Status   MRSA by PCR Next Gen NOT DETECTED NOT DETECTED Final    Comment: (NOTE) The GeneXpert MRSA Assay (FDA approved for NASAL specimens only), is one component of a comprehensive MRSA colonization surveillance program. It is not intended to diagnose MRSA infection nor to guide or monitor treatment for MRSA infections. Test performance is not FDA approved in patients less than 15 years old. Performed at [facility], [place] [address] [place], [place] [address]     Radiology Studies: No results found.    Dariella Gillihan T. [place]  If 7PM-7AM, please contact night-coverage [email or web address] 03/13/2022, 3:09 PM

## 2022-03-13 NOTE — Progress Notes (Signed)
Nephrology Progress Note  Subjective:  Seen in room. LHC today w/ LVEDP 28.   Objective Vital signs in last 24 hours: Vitals:   03/12/22 1435 03/12/22 1440 03/12/22 1445 03/12/22 1450  BP:  109/71    Pulse: 74 75 80 77  Resp: (!) 32 (!) 30 (!) 24 18  Temp:      TempSrc:      SpO2: 91% 91% 97% 94%  Weight:      Height:       Physical Exam:    General adult William [name] in bed in no acute distress HEENT normocephalic atraumatic extraocular movements intact sclera anicteric Neck supple trachea midline Lungs clear bilaterally, normal work of breathing on room air Heart S1S2 no rub Abdomen soft nontender distended; PD catheter in place and dressed Extremities no edema appreciated; no cyanosis Psych no current agitation  Neuro - alert, following commands   Summary: Pt is a 82 y.o. yo William [name] with ESRD on PD who was admitted on 03/02/2022 with PNA but also felt to have NSTEMI  OP PD: f/b Dr Dimas Aguas from [facility]. 4 exchanges overnight, 2500 cc dwell vol, uses mostly 1.5% and 2.5% bags.  On PD for 3 years. Dry wt 181- 185  lbs per the pt.     Assessment/ Plan NSTEMI/ CAD - w/ tight LAD stenosis; unable to fix per cardiology 1/08 due to difficulties w/ anticoagulation. Going back to cath lab today.  Acute hypoxemic respiratory failure - dramatic perhilar infiltrates/ edema on 1/04 CXR, some of that may have been PNA. Is not requiring O2 today and we have had some good UF's the last 5-6 days (including 1.5 L last night). Will repeat CXR today. Will use all 4.25% fluids overnight tonight Right sided community acquired PNA-  was on vent for a while. Completed antibiotics  ESRD - on PD at home. Cont PD nightly.  Hypokalemia - K+ 5.1 today, dc po Kdur Anemia of CKD -  he is ordered aranesp 200 mcg weekly on Mondays and got the last dose.  Hb improved and is > 11 now. Lowered esa to 100 ug weekly on Monday.  Secondary hyperparathyroidism-  hyperphosphatemia however he states he has never been on a  binder and that phos is usually controlled.  Have started renvela for now for phos 4-6 range.  HTN -  controlled   Kelly Splinter, MD 03/13/2022, 1:17 PM  Recent Labs  Lab 03/12/22 0023 03/13/22 0719  HGB 11.1* 11.7*  ALBUMIN 2.5* 2.8*  CALCIUM 8.4* 9.3  PHOS 4.3 5.7*  CREATININE 6.90* 7.16*  K 4.2 5.1     Inpatient medications:  sodium chloride   Intravenous Once   aspirin  81 mg Oral Daily   atorvastatin  40 mg Oral Daily   carvedilol  3.125 mg Oral BID WC   Chlorhexidine Gluconate Cloth  6 each Topical Daily   clopidogrel  75 mg Oral Daily   darbepoetin (ARANESP) injection - NON-DIALYSIS  200 mcg Subcutaneous Q Mon-1800   gentamicin cream   Topical Daily   guaiFENesin  600 mg Oral BID   heparin  5,000 Units Subcutaneous Q8H   insulin aspart  1-3 Units Subcutaneous Q4H   insulin detemir  5 Units Subcutaneous Q12H   melatonin  3 mg Oral QHS   pantoprazole  40 mg Oral Daily   potassium chloride  40 mEq Oral BID   QUEtiapine  25 mg Oral QHS   sevelamer carbonate  800 mg Oral TID WC  sodium chloride flush  3 mL Intravenous Q12H   sodium chloride flush  3 mL Intravenous Q12H    sodium chloride 10 mL/hr at 03/12/22 1057   sodium chloride     sodium chloride 10 mL/hr at 03/13/22 0651   dialysis solution 2.5% low-MG/low-CA     dialysis solution 4.25% low-MG/low-CA     sodium chloride, sodium chloride, acetaminophen **OR** acetaminophen, calcium carbonate, guaiFENesin-dextromethorphan, haloperidol lactate, ipratropium-albuterol, metoprolol tartrate, mouth rinse, [COMPLETED] polyethylene glycol **FOLLOWED BY** polyethylene glycol, prochlorperazine, sodium chloride flush, white petrolatum

## 2022-03-13 NOTE — Plan of Care (Signed)
Plan for repeat LHC today. No other changes. See cath notes for any changes in recommendations post-op

## 2022-03-14 ENCOUNTER — Inpatient Hospital Stay (HOSPITAL_COMMUNITY)
Admission: EM | Disposition: A | Discharge status: Home / Self Care | Payer: Self-pay | Source: Home / Self Care | Attending: Student

## 2022-03-14 DIAGNOSIS — Z7189 Other specified counseling: Secondary | ICD-10-CM | POA: Diagnosis not present

## 2022-03-14 DIAGNOSIS — J9601 Acute respiratory failure with hypoxia: Secondary | ICD-10-CM | POA: Diagnosis not present

## 2022-03-14 DIAGNOSIS — R410 Disorientation, unspecified: Secondary | ICD-10-CM | POA: Diagnosis not present

## 2022-03-14 DIAGNOSIS — E8721 Acute metabolic acidosis: Secondary | ICD-10-CM | POA: Diagnosis not present

## 2022-03-14 HISTORY — PX: CORONARY STENT INTERVENTION: CATH118234

## 2022-03-14 LAB — CBC
HCT: 38.6 % — ABNORMAL LOW (ref 39.0–52.0)
Hemoglobin: 12.2 g/dL — ABNORMAL LOW (ref 13.0–17.0)
MCH: 33.3 pg (ref 26.0–34.0)
MCHC: 31.6 g/dL (ref 30.0–36.0)
MCV: 105.5 fL — ABNORMAL HIGH (ref 80.0–100.0)
Platelets: 304 10*3/uL (ref 150–400)
RBC: 3.66 MIL/uL — ABNORMAL LOW (ref 4.22–5.81)
RDW: 13.5 % (ref 11.5–15.5)
WBC: 13.7 10*3/uL — ABNORMAL HIGH (ref 4.0–10.5)
nRBC: 0.7 % — ABNORMAL HIGH (ref 0.0–0.2)

## 2022-03-14 LAB — RENAL FUNCTION PANEL
Albumin: 3.2 g/dL — ABNORMAL LOW (ref 3.5–5.0)
Anion gap: 18 — ABNORMAL HIGH (ref 5–15)
BUN: 64 mg/dL — ABNORMAL HIGH (ref 8–23)
CO2: 25 mmol/L (ref 22–32)
Calcium: 9.6 mg/dL (ref 8.9–10.3)
Chloride: 98 mmol/L (ref 98–111)
Creatinine, Ser: 7.53 mg/dL — ABNORMAL HIGH (ref 0.61–1.24)
GFR, Estimated: 7 mL/min — ABNORMAL LOW (ref >60)
Glucose, Bld: 195 mg/dL — ABNORMAL HIGH (ref 70–99)
Phosphorus: 5.1 mg/dL — ABNORMAL HIGH (ref 2.5–4.6)
Potassium: 4.5 mmol/L (ref 3.5–5.1)
Sodium: 141 mmol/L (ref 135–145)

## 2022-03-14 LAB — HEMOGLOBIN A1C
Hgb A1c MFr Bld: 5.5 % (ref 4.8–5.6)
Mean Plasma Glucose: 111.15 mg/dL

## 2022-03-14 LAB — GLUCOSE, CAPILLARY
Glucose-Capillary: 104 mg/dL — ABNORMAL HIGH (ref 70–99)
Glucose-Capillary: 121 mg/dL — ABNORMAL HIGH (ref 70–99)
Glucose-Capillary: 138 mg/dL — ABNORMAL HIGH (ref 70–99)
Glucose-Capillary: 160 mg/dL — ABNORMAL HIGH (ref 70–99)
Glucose-Capillary: 198 mg/dL — ABNORMAL HIGH (ref 70–99)

## 2022-03-14 LAB — MAGNESIUM: Magnesium: 2.4 mg/dL (ref 1.7–2.4)

## 2022-03-14 LAB — POCT ACTIVATED CLOTTING TIME: Activated Clotting Time: 206 seconds

## 2022-03-14 LAB — LIPOPROTEIN A (LPA): Lipoprotein (a): 81.8 nmol/L — ABNORMAL HIGH (ref <75.0)

## 2022-03-14 SURGERY — CORONARY STENT INTERVENTION
Anesthesia: LOCAL

## 2022-03-14 MED ORDER — BIVALIRUDIN BOLUS VIA INFUSION - CUPID
INTRAVENOUS | Status: DC | PRN
  Administered 2022-03-14: 57.15 mg via INTRAVENOUS

## 2022-03-14 MED ORDER — SODIUM CHLORIDE 0.9% FLUSH: 3.0000 mL | INTRAVENOUS | Status: DC | PRN

## 2022-03-14 MED ORDER — SODIUM CHLORIDE 0.9 % IV SOLN
INTRAVENOUS | Status: DC | PRN
  Administered 2022-03-14: .25 mg/kg/h via INTRAVENOUS

## 2022-03-14 MED ORDER — LIDOCAINE HCL (PF) 1 % IJ SOLN
INTRAMUSCULAR | Status: DC | PRN
  Administered 2022-03-14: 10 mL

## 2022-03-14 MED ORDER — HYDRALAZINE HCL 20 MG/ML IJ SOLN: 10.0000 mg | INTRAMUSCULAR | Status: AC | PRN

## 2022-03-14 MED ORDER — NITROGLYCERIN 1 MG/10 ML FOR IR/CATH LAB
INTRA_ARTERIAL | Status: AC
  Filled 2022-03-14: qty 10

## 2022-03-14 MED ORDER — MIDAZOLAM HCL 2 MG/2ML IJ SOLN
INTRAMUSCULAR | Status: AC
  Filled 2022-03-14: qty 2

## 2022-03-14 MED ORDER — IOHEXOL 350 MG/ML SOLN
INTRAVENOUS | Status: DC | PRN
  Administered 2022-03-14: 190 mL

## 2022-03-14 MED ORDER — DELFLEX-LC/1.5% DEXTROSE 344 MOSM/L IP SOLN: INTRAPERITONEAL | Status: DC

## 2022-03-14 MED ORDER — HEPARIN (PORCINE) IN NACL 1000-0.9 UT/500ML-% IV SOLN
INTRAVENOUS | Status: AC
  Filled 2022-03-14: qty 1000

## 2022-03-14 MED ORDER — HEPARIN (PORCINE) IN NACL 1000-0.9 UT/500ML-% IV SOLN
INTRAVENOUS | Status: DC | PRN
  Administered 2022-03-14 (×2): 500 mL

## 2022-03-14 MED ORDER — DELFLEX-LC/2.5% DEXTROSE 394 MOSM/L IP SOLN: INTRAPERITONEAL | Status: DC

## 2022-03-14 MED ORDER — FENTANYL CITRATE (PF) 100 MCG/2ML IJ SOLN
INTRAMUSCULAR | Status: DC | PRN
  Administered 2022-03-14 (×2): 25 ug via INTRAVENOUS

## 2022-03-14 MED ORDER — ASPIRIN 81 MG PO CHEW: 81.0000 mg | CHEWABLE_TABLET | Freq: Every day | ORAL | Status: DC

## 2022-03-14 MED ORDER — ENOXAPARIN SODIUM 30 MG/0.3ML IJ SOSY
30.0000 mg | PREFILLED_SYRINGE | INTRAMUSCULAR | Status: DC
  Administered 2022-03-15: 30 mg via SUBCUTANEOUS
  Filled 2022-03-14: qty 0.3

## 2022-03-14 MED ORDER — LABETALOL HCL 5 MG/ML IV SOLN: 10.0000 mg | INTRAVENOUS | Status: AC | PRN

## 2022-03-14 MED ORDER — SODIUM CHLORIDE 0.9 % IV SOLN: 250.0000 mL | INTRAVENOUS | Status: DC | PRN

## 2022-03-14 MED ORDER — SODIUM CHLORIDE 0.9% FLUSH: 3.0000 mL | Freq: Two times a day (BID) | INTRAVENOUS | Status: DC

## 2022-03-14 MED ORDER — ATORVASTATIN CALCIUM 80 MG PO TABS
80.0000 mg | ORAL_TABLET | Freq: Every day | ORAL | Status: DC
  Administered 2022-03-15: 80 mg via ORAL
  Filled 2022-03-14: qty 1

## 2022-03-14 MED ORDER — VERAPAMIL HCL 2.5 MG/ML IV SOLN
INTRAVENOUS | Status: AC
  Filled 2022-03-14: qty 2

## 2022-03-14 MED ORDER — LIDOCAINE HCL (PF) 1 % IJ SOLN
INTRAMUSCULAR | Status: AC
  Filled 2022-03-14: qty 30

## 2022-03-14 MED ORDER — ACETAMINOPHEN 325 MG PO TABS: 650.0000 mg | ORAL_TABLET | ORAL | Status: DC | PRN

## 2022-03-14 MED ORDER — MIDAZOLAM HCL 2 MG/2ML IJ SOLN
INTRAMUSCULAR | Status: DC | PRN
  Administered 2022-03-14: 1 mg via INTRAVENOUS

## 2022-03-14 MED ORDER — FENTANYL CITRATE (PF) 100 MCG/2ML IJ SOLN
INTRAMUSCULAR | Status: AC
  Filled 2022-03-14: qty 2

## 2022-03-14 MED ORDER — ONDANSETRON HCL 4 MG/2ML IJ SOLN: 4.0000 mg | Freq: Four times a day (QID) | INTRAMUSCULAR | Status: DC | PRN

## 2022-03-14 MED ORDER — SODIUM CHLORIDE 0.9% FLUSH
3.0000 mL | Freq: Two times a day (BID) | INTRAVENOUS | Status: DC
  Administered 2022-03-14 – 2022-03-15 (×2): 3 mL via INTRAVENOUS

## 2022-03-14 MED ORDER — NITROGLYCERIN 1 MG/10 ML FOR IR/CATH LAB
INTRA_ARTERIAL | Status: DC | PRN
  Administered 2022-03-14 (×3): 100 ug via INTRACORONARY

## 2022-03-14 MED ORDER — CLOPIDOGREL BISULFATE 75 MG PO TABS: 75.0000 mg | ORAL_TABLET | Freq: Every day | ORAL | Status: DC

## 2022-03-14 SURGICAL SUPPLY — 22 items
BALLN EMERGE MR 2.0X12 (BALLOONS) ×1
BALLN EMERGE MR 2.5X15 (BALLOONS) ×1
BALLN WOLVERINE 2.25X10 (BALLOONS) ×1
BALLN ~~LOC~~ EMERGE MR 3.25X12 (BALLOONS) ×1
BALLOON EMERGE MR 2.0X12 (BALLOONS) IMPLANT
BALLOON EMERGE MR 2.5X15 (BALLOONS) IMPLANT
BALLOON WOLVERINE 2.25X10 (BALLOONS) IMPLANT
BALLOON ~~LOC~~ EMERGE MR 3.25X12 (BALLOONS) IMPLANT
CATH VISTA GUIDE 6FR XBLAD3.5 (CATHETERS) IMPLANT
ELECT DEFIB PAD ADLT CADENCE (PAD) IMPLANT
KIT ENCORE 26 ADVANTAGE (KITS) IMPLANT
KIT HEART LEFT (KITS) ×1 IMPLANT
MAT PREVALON FULL STRYKER (MISCELLANEOUS) IMPLANT
PACK CARDIAC CATHETERIZATION (CUSTOM PROCEDURE TRAY) ×1 IMPLANT
PROTECTION STATION PRESSURIZED (MISCELLANEOUS) ×1
SHEATH PINNACLE 6F 10CM (SHEATH) IMPLANT
STATION PROTECTION PRESSURIZED (MISCELLANEOUS) IMPLANT
STENT ONYX FRONTIER 3.0X18 (Permanent Stent) IMPLANT
TRANSDUCER W/STOPCOCK (MISCELLANEOUS) ×1 IMPLANT
TUBING CIL FLEX 10 FLL-RA (TUBING) ×1 IMPLANT
WIRE COUGAR XT STRL 190CM (WIRE) IMPLANT
WIRE EMERALD 3MM-J .035X150CM (WIRE) IMPLANT

## 2022-03-14 NOTE — TOC Progression Note (Signed)
Transition of Care [facility]) - Progression Note    Patient Details  Name: William [name] MRN: [id number] Date of Birth: [birth year]  [facility] CM/SW Contact  Levonne Lapping, RN Phone Number: 03/14/2022, 1:43 PM e Clinical Narrative:    CM met with Patient and his oldest Daughter, [name] at bedside. Patient stated he "could be better" when CM asked how he was feeling today. Patient lives at home with his Wife.  Daughter states that she has moved in to her Parents home to be of assistance. Of note: Wife is recently hospitalized [facility] at [facility] for what Daughter said is upper respiratory and A-Fib. [name] states there is a younger Sister named [name] that was recently [employer] for their Father. 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  In the home there are 2 RW and 2 Chical that had been used by another family member. Patient recently acquired an adjustable bed and he also has an elevated toilet seat. Patient has not had Home Health before. Recs for DC are still pending and TOC will continue to follow.       Expected Discharge Plan: Home/Self Care Barriers to Discharge: Continued Medical Work up  Expected Discharge Plan and Services   Discharge Planning Services: CM Consult Post Acute Care Choice: NA Living arrangements for the past 2 months: [place]: NA HH Agency: NA         Social Determinants of Health (SDOH) Interventions SDOH Screenings   Food Insecurity: No Food Insecurity (03/14/2022)  Housing: Low Risk  (03/14/2022)  Transportation Needs: No Transportation Needs (03/14/2022)  Utilities: Not At Risk (03/14/2022)  Tobacco Use: Medium Risk (03/13/2022)    Readmission Risk Interventions     No data to display

## 2022-03-14 NOTE — Progress Notes (Signed)
Progress Note  Patient Name: William [name] Date of Encounter: 03/14/2022  Primary Cardiologist: Janina Mayo, MD  Subjective   A&O today. Daughter is by the bedside. Finished PD session. No chest pain  Inpatient Medications    Scheduled Meds:  sodium chloride   Intravenous Once   aspirin  81 mg Oral Daily   atorvastatin  40 mg Oral Daily   carvedilol  3.125 mg Oral BID WC   Chlorhexidine Gluconate Cloth  6 each Topical Daily   clopidogrel  75 mg Oral Daily   [START ON 03/19/2022] darbepoetin (ARANESP) injection - NON-DIALYSIS  100 mcg Subcutaneous Q Mon-1800   gentamicin cream   Topical Daily   guaiFENesin  600 mg Oral BID   heparin  5,000 Units Subcutaneous Q8H   insulin aspart  1-3 Units Subcutaneous Q4H   insulin detemir  5 Units Subcutaneous Q12H   melatonin  3 mg Oral QHS   pantoprazole  40 mg Oral Daily   QUEtiapine  25 mg Oral QHS   sevelamer carbonate  800 mg Oral TID WC   sodium chloride flush  3 mL Intravenous Q12H   sodium chloride flush  3 mL Intravenous Q12H   sodium chloride flush  3 mL Intravenous Q12H   Continuous Infusions:  sodium chloride 10 mL/hr at 03/12/22 1057   sodium chloride     sodium chloride     sodium chloride 10 mL/hr at 03/13/22 1500   dialysis solution 4.25% low-MG/low-CA     dialysis solution 4.25% low-MG/low-CA     PRN Meds: sodium chloride, sodium chloride, sodium chloride, acetaminophen **OR** acetaminophen, calcium carbonate, guaiFENesin-dextromethorphan, haloperidol lactate, ipratropium-albuterol, metoprolol tartrate, mouth rinse, [COMPLETED] polyethylene glycol **FOLLOWED BY** polyethylene glycol, prochlorperazine, sodium chloride flush, sodium chloride flush, white petrolatum   Vital Signs    Vitals:   03/13/22 2135 03/14/22 0030 03/14/22 0441 03/14/22 0905  BP: 121/77 105/65 112/70 119/73  Pulse: 80 69 69 77  Resp: '20 20 18   '$ Temp: 97.7 F (36.5 C) 98.1 F (36.7 C) 98.4 F (36.9 C)   TempSrc: Oral Axillary  Axillary   SpO2: 98% 96% 97%   Weight:      Height:        Intake/Output Summary (Last 24 hours) at 03/14/2022 0946 Last data filed at 03/13/2022 1500 Gross per 24 hour  Intake 81.38 ml  Output 1568 ml  Net -1486.62 ml   Filed Weights   03/09/22 0733 03/13/22 0416 03/13/22 1044  Weight: 83.6 kg 83.8 kg 76.2 kg    Telemetry    Sinus   ECG    Diffuse ST segment depressions except in V1 and aVR where there is ST elevation 1/7  Physical Exam   GEN: No acute distress.  A&Ox3 Neck: No JVD. Cardiac: RRR, no murmur, rub, or gallop.  VASC: 2+ radial pulses BL Respiratory: Nonlabored. Clear to auscultation bilaterally. GI: Soft, nontender, bowel sounds present. MS: No edema; No deformity. Neuro:  Nonfocal. Psych: Alert and oriented x 3. Normal affect.  Labs    Chemistry Recent Labs  Lab 03/08/22 1520 03/09/22 0731 03/12/22 0023 03/13/22 0719 03/14/22 0136  NA 135   < > 138 142 141  K 3.7   < > 4.2 5.1 4.5  CL 96*   < > 101 102 98  CO2 21*   < > '24 26 25  '$ GLUCOSE 129*   < > 129* 181* 195*  BUN 76*   < > 64* 63* 64*  CREATININE 6.89*   < >  6.90* 7.16* 7.53*  CALCIUM 8.3*   < > 8.4* 9.3 9.6  PROT 6.1*  --   --   --   --   ALBUMIN 2.8*   < > 2.5* 2.8* 3.2*  AST 32  --   --   --   --   ALT 31  --   --   --   --   ALKPHOS 49  --   --   --   --   BILITOT 0.6  --   --   --   --   GFRNONAA 8*   < > 7* 7* 7*  ANIONGAP 18*   < > 13 14 18*   < > = values in this interval not displayed.     Hematology Recent Labs  Lab 03/12/22 0023 03/13/22 0719 03/14/22 0136  WBC 13.5* 12.6* 13.7*  RBC 3.37* 3.52* 3.66*  HGB 11.1* 11.7* 12.2*  HCT 34.1* 36.3* 38.6*  MCV 101.2* 103.1* 105.5*  MCH 32.9 33.2 33.3  MCHC 32.6 32.2 31.6  RDW 13.2 13.5 13.5  PLT 309 305 304    Cardiac Enzymes Recent Labs  Lab 03/08/22 0553 03/08/22 0756 03/08/22 0917 03/08/22 1520 03/08/22 1850  TROPONINIHS 2,478* 2,695* 2,560* 1,534* 1,415*    BNPNo results for input(s): "BNP", "PROBNP"  in the last 168 hours.   DDimerNo results for input(s): "DDIMER" in the last 168 hours.   Radiology    CARDIAC CATHETERIZATION  Result Date: 03/12/2022 Conclusions: Multivessel coronary artery disease, as detailed below.  Most severe lesion is an 80-90% mid LAD stenosis.  There is also moderate to severe disease involving small branches (D1 and rPDA) as well as moderate stenosis affecting distal LCx and rPLAV. Moderately elevated left ventricular filling pressure (LVEDP 25-30 mmHg). Aborted attempt at PCI of the mid LAD due to inability to achieve therapeutic anticoagulation despite administration of 16,000 unit of IV heparin. Recommendations: Consider PCI to mid LAD as soon as tomorrow using alternative intraprocedural anticoagulation strategy (i.e. bivalirudin). Dual antiplatelet therapy with aspirin and clopidogrel for at least 12 months. Consider escalation of fluid removal with peritoneal dialysis in the setting of moderately elevated LVEDP. Aggressive secondary prevention of coronary artery disease. Nelva Bush, MD [facility]   Cardiac Studies  Echo from 03/04/2022 LVEF 40 to 06% Grade 1 diastolic dysfunction RV systolic function is mildly reduced Mild MR Mild AI   Assessment & Plan    Patient is 82 year old M known to have ESRD on PD, HTN, hypothyroidism, HLD, mild MR presented with acute hypoxic respiratory failure secondary to significant pneumonia and NSTEMI. He is stable  # NSTEMI, likely type II in the setting of pneumonia but type I cannot be ruled out completely -Patient's troponins initially were 800 - 900s but peaked to 2695 during this hospitalization. EKG showed diffuse ST segment depressions except in aVR and V1 that has ST elevation. Echo showed LVEF 40 to 45% with no RWMA. Patient denied having chest pain or SOB but did have SOB prior to admission possibly from pneumonia. He completed antibiotic course for pneumonia and his confusion resolved. Hemoglobin has been  stable and no bleeding episodes ever in his life. Patient will benefit from invasive ischemia evaluation with LHC prior to discharge.Risks and benefits of cardiac catheterization have been discussed with the patient.  These include bleeding, infection, kidney damage, stroke, heart attack, death. The patient understands these risks and is willing to proceed. -Patient is had LHC 1/8, showed mid LAD disease; however ACT  would not reach a therapeutic level. Plan to retry 1/9; however he refused the procedure. Today, he is with his daughter. He is awake and alert. He is amenable to proceed. Will add to the schedule, keep NPO -Continue aspirin 81 mg once daily -Continue atorvastatin 20 mg nightly -Switch carvedilol to metoprolol 25 mg twice daily  # Brief paroxysms of possible atrial tachycardia/SVT -Switch carvedilol to metoprolol 25 mg twice daily -2-week event monitor upon discharge was planned prior -OSA evaluation outpatient  # Acute hypoxic respiratory failure secondary to pneumonia, extubated on 03/05/2022: Patient pleated antibiotic course for pneumonia. 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  Patient did not have any bleeding episodes in his life. Hemoglobin was 7.9 in the current admission (which could be secondary to sepsis and anemia of chronic disease) for which he received PRBC transfusion. Hemoglobin stable 12  ESRD: peritoneal dialysis  # HFrEF, LVEF 40 to 45%, rule out CAD: Switch carvedilol to metoprolol 25 mg twice daily. Euvolemic.UF per PD  # HLD: Not on therapy prior to admission.  LDL 122 in 01/2022.  Repeat lipid panel with LDL at 94.  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 Atorvastatin 20 mg nightly is initiated during this  hospitalization.    Time Spent Directly with Patient:  I have spent a total of 35 minutes with the patient reviewing hospital notes, telemetry, EKGs, labs and examining the patient as well as establishing an assessment and plan that was discussed personally with the patient.  > 50% of time was spent in direct patient care.   Signed, Janina Mayo, MD  03/14/2022, 9:46 AM

## 2022-03-14 NOTE — Interval H&P Note (Signed)
Cath Lab Visit (complete for each Cath Lab visit)  Clinical Evaluation Leading to the Procedure:   ACS: Yes.    Non-ACS:    Anginal Classification: CCS III  Anti-ischemic medical therapy: Minimal Therapy (1 class of medications)  Non-Invasive Test Results: No non-invasive testing performed  Prior CABG: No previous CABG      History and Physical Interval Note:  03/14/2022 6:35 PM  William [name]  has presented today for surgery, with the diagnosis of cad.  The various methods of treatment have been discussed with the patient and family. After consideration of risks, benefits and other options for treatment, the patient has consented to  Procedure(s): CORONARY STENT INTERVENTION (N/A) as a surgical intervention.  The patient's history has been reviewed, patient examined, no change in status, stable for surgery.  I have reviewed the patient's chart and labs.  Questions were answered to the patient's satisfaction.     Shelva Majestic

## 2022-03-14 NOTE — H&P (View-Only) (Signed)
Progress Note  Patient Name: William [name] Date of Encounter: 03/14/2022  Primary Cardiologist: Janina Mayo, MD  Subjective   A&O today. Daughter is by the bedside. Finished PD session. No chest pain  Inpatient Medications    Scheduled Meds:  sodium chloride   Intravenous Once   aspirin  81 mg Oral Daily   atorvastatin  40 mg Oral Daily   carvedilol  3.125 mg Oral BID WC   Chlorhexidine Gluconate Cloth  6 each Topical Daily   clopidogrel  75 mg Oral Daily   [START ON 03/19/2022] darbepoetin (ARANESP) injection - NON-DIALYSIS  100 mcg Subcutaneous Q Mon-1800   gentamicin cream   Topical Daily   guaiFENesin  600 mg Oral BID   heparin  5,000 Units Subcutaneous Q8H   insulin aspart  1-3 Units Subcutaneous Q4H   insulin detemir  5 Units Subcutaneous Q12H   melatonin  3 mg Oral QHS   pantoprazole  40 mg Oral Daily   QUEtiapine  25 mg Oral QHS   sevelamer carbonate  800 mg Oral TID WC   sodium chloride flush  3 mL Intravenous Q12H   sodium chloride flush  3 mL Intravenous Q12H   sodium chloride flush  3 mL Intravenous Q12H   Continuous Infusions:  sodium chloride 10 mL/hr at 03/12/22 1057   sodium chloride     sodium chloride     sodium chloride 10 mL/hr at 03/13/22 1500   dialysis solution 4.25% low-MG/low-CA     dialysis solution 4.25% low-MG/low-CA     PRN Meds: sodium chloride, sodium chloride, sodium chloride, acetaminophen **OR** acetaminophen, calcium carbonate, guaiFENesin-dextromethorphan, haloperidol lactate, ipratropium-albuterol, metoprolol tartrate, mouth rinse, [COMPLETED] polyethylene glycol **FOLLOWED BY** polyethylene glycol, prochlorperazine, sodium chloride flush, sodium chloride flush, white petrolatum   Vital Signs    Vitals:   03/13/22 2135 03/14/22 0030 03/14/22 0441 03/14/22 0905  BP: 121/77 105/65 112/70 119/73  Pulse: 80 69 69 77  Resp: '20 20 18   '$ Temp: 97.7 F (36.5 C) 98.1 F (36.7 C) 98.4 F (36.9 C)   TempSrc: Oral Axillary  Axillary   SpO2: 98% 96% 97%   Weight:      Height:        Intake/Output Summary (Last 24 hours) at 03/14/2022 0946 Last data filed at 03/13/2022 1500 Gross per 24 hour  Intake 81.38 ml  Output 1568 ml  Net -1486.62 ml   Filed Weights   03/09/22 0733 03/13/22 0416 03/13/22 1044  Weight: 83.6 kg 83.8 kg 76.2 kg    Telemetry    Sinus   ECG    Diffuse ST segment depressions except in V1 and aVR where there is ST elevation 1/7  Physical Exam   GEN: No acute distress.  A&Ox3 Neck: No JVD. Cardiac: RRR, no murmur, rub, or gallop.  VASC: 2+ radial pulses BL Respiratory: Nonlabored. Clear to auscultation bilaterally. GI: Soft, nontender, bowel sounds present. MS: No edema; No deformity. Neuro:  Nonfocal. Psych: Alert and oriented x 3. Normal affect.  Labs    Chemistry Recent Labs  Lab 03/08/22 1520 03/09/22 0731 03/12/22 0023 03/13/22 0719 03/14/22 0136  NA 135   < > 138 142 141  K 3.7   < > 4.2 5.1 4.5  CL 96*   < > 101 102 98  CO2 21*   < > '24 26 25  '$ GLUCOSE 129*   < > 129* 181* 195*  BUN 76*   < > 64* 63* 64*  CREATININE 6.89*   < >  6.90* 7.16* 7.53*  CALCIUM 8.3*   < > 8.4* 9.3 9.6  PROT 6.1*  --   --   --   --   ALBUMIN 2.8*   < > 2.5* 2.8* 3.2*  AST 32  --   --   --   --   ALT 31  --   --   --   --   ALKPHOS 49  --   --   --   --   BILITOT 0.6  --   --   --   --   GFRNONAA 8*   < > 7* 7* 7*  ANIONGAP 18*   < > 13 14 18*   < > = values in this interval not displayed.     Hematology Recent Labs  Lab 03/12/22 0023 03/13/22 0719 03/14/22 0136  WBC 13.5* 12.6* 13.7*  RBC 3.37* 3.52* 3.66*  HGB 11.1* 11.7* 12.2*  HCT 34.1* 36.3* 38.6*  MCV 101.2* 103.1* 105.5*  MCH 32.9 33.2 33.3  MCHC 32.6 32.2 31.6  RDW 13.2 13.5 13.5  PLT 309 305 304    Cardiac Enzymes Recent Labs  Lab 03/08/22 0553 03/08/22 0756 03/08/22 0917 03/08/22 1520 03/08/22 1850  TROPONINIHS 2,478* 2,695* 2,560* 1,534* 1,415*    BNPNo results for input(s): "BNP", "PROBNP"  in the last 168 hours.   DDimerNo results for input(s): "DDIMER" in the last 168 hours.   Radiology    CARDIAC CATHETERIZATION  Result Date: 03/12/2022 Conclusions: Multivessel coronary artery disease, as detailed below.  Most severe lesion is an 80-90% mid LAD stenosis.  There is also moderate to severe disease involving small branches (D1 and rPDA) as well as moderate stenosis affecting distal LCx and rPLAV. Moderately elevated left ventricular filling pressure (LVEDP 25-30 mmHg). Aborted attempt at PCI of the mid LAD due to inability to achieve therapeutic anticoagulation despite administration of 16,000 unit of IV heparin. Recommendations: Consider PCI to mid LAD as soon as tomorrow using alternative intraprocedural anticoagulation strategy (i.e. bivalirudin). Dual antiplatelet therapy with aspirin and clopidogrel for at least 12 months. Consider escalation of fluid removal with peritoneal dialysis in the setting of moderately elevated LVEDP. Aggressive secondary prevention of coronary artery disease. Nelva Bush, MD [facility]   Cardiac Studies  Echo from 03/04/2022 LVEF 40 to 47% Grade 1 diastolic dysfunction RV systolic function is mildly reduced Mild MR Mild AI   Assessment & Plan    Patient is 82 year old M known to have ESRD on PD, HTN, hypothyroidism, HLD, mild MR presented with acute hypoxic respiratory failure secondary to significant pneumonia and NSTEMI. He is stable  # NSTEMI, likely type II in the setting of pneumonia but type I cannot be ruled out completely -Patient's troponins initially were 800 - 900s but peaked to 2695 during this hospitalization. EKG showed diffuse ST segment depressions except in aVR and V1 that has ST elevation. Echo showed LVEF 40 to 45% with no RWMA. Patient denied having chest pain or SOB but did have SOB prior to admission possibly from pneumonia. He completed antibiotic course for pneumonia and his confusion resolved. Hemoglobin has been  stable and no bleeding episodes ever in his life. Patient will benefit from invasive ischemia evaluation with LHC prior to discharge.Risks and benefits of cardiac catheterization have been discussed with the patient.  These include bleeding, infection, kidney damage, stroke, heart attack, death. The patient understands these risks and is willing to proceed. -Patient is had LHC 1/8, showed mid LAD disease; however ACT  would not reach a therapeutic level. Plan to retry 1/9; however he refused the procedure. Today, he is with his daughter. He is awake and alert. He is amenable to proceed. Will add to the schedule, keep NPO -Continue aspirin 81 mg once daily -Continue atorvastatin 20 mg nightly -Switch carvedilol to metoprolol 25 mg twice daily  # Brief paroxysms of possible atrial tachycardia/SVT -Switch carvedilol to metoprolol 25 mg twice daily -2-week event monitor upon discharge was planned prior -[facility] evaluation outpatient  # Acute hypoxic respiratory failure secondary to pneumonia, extubated on 03/05/2022: Patient pleated antibiotic course for pneumonia. 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  Patient did not have any bleeding episodes in his life. Hemoglobin was 7.9 in the current admission (which could be secondary to sepsis and anemia of chronic disease) for which he received PRBC transfusion. Hemoglobin stable 12  ESRD: peritoneal dialysis  # HFrEF, LVEF 40 to 45%, rule out CAD: Switch carvedilol to metoprolol 25 mg twice daily. Euvolemic.UF per PD  # HLD: Not on therapy prior to admission.  LDL 122 in 01/2022.  Repeat lipid panel with LDL at 94.  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 Atorvastatin 20 mg nightly is initiated during this  hospitalization.    Time Spent Directly with Patient:  I have spent a total of 35 minutes with the patient reviewing hospital notes, telemetry, EKGs, labs and examining the patient as well as establishing an assessment and plan that was discussed personally with the patient.  > 50% of time was spent in direct patient care.   Signed, Janina Mayo, MD  03/14/2022, 9:46 AM

## 2022-03-14 NOTE — Progress Notes (Signed)
PROGRESS NOTE  William [name] YWV:[id number] DOB: 08/17/1940   PCP: [name], PA-C  Patient is from: Home.  DOA: 03/02/2022 LOS: 65  Chief complaints Chief Complaint  Patient presents with   Emesis   Shortness of Breath     Brief Narrative / Interim history: 82 year old man with hx of combined CHF, ESRD on PD, HTN, HLD and BPH presenting with with productive cough, dyspnea on exertion and subjective fever and admitted to ICU for acute respiratory failure with hypoxia in the setting of multifocal pneumonia.  He was intubated from 12/29-1/1.  Started on IV ceftriaxone and azithromycin on 12/29, and vancomycin on 12/30.  He was transferred out of ICU on 1/2 but developed increasing agitation, confusion the night of 1/3 and started on Precedex drip and sent back to ICU.  EKG on 1/4 with increased ST/T changes in inferolateral leads.  He also had significantly elevated troponin, 800>>> 2700 (peak).  TTE with LVEF of 40 to 45% and G1-DD.  Cardiology consulted.   Eventually, delirium improved, came off Precedex drip and transferred to [facility] hospitalist service on 03/10/2021.  Completed antibiotic course on 03/10/2021.  Respiratory failure resolved.  Cardiology and nephrology following.    LHC on 1/8 with multivessel CAD with most severe lesion in mid LAD with 80 to 90% stenosis.  PCI aborted on 1/8 due to inability to achieve therapeutic anticoagulation, and on 1/9 due to delirium.  Delirium improved.  Cardiology planning to reattempt PCI on 1/10.  Palliative medicine consulted as well.  Subjective: Seen and examined earlier this morning.  No major events overnight of this morning.  Patient's daughter at bedside.  He is awake and alert oriented to self and person.  He is quite clear with place and time.  He seems to have some degree of delirium.  He states he refused procedure yesterday since he wasn't sure about the doctors credential and the company involved.  He reports nausea,  vomiting and abdominal pain that has improved after nausea medication.  Daughter checked with family member who spent the night with patient and there was no incidence of vomiting.  Objective: Vitals:   03/13/22 2135 03/14/22 0030 03/14/22 0441 03/14/22 0905  BP: 121/77 105/65 112/70 119/73  Pulse: 80 69 69 77  Resp: '20 20 18   '$ Temp: 97.7 F (36.5 C) 98.1 F (36.7 C) 98.4 F (36.9 C)   TempSrc: Oral Axillary Axillary   SpO2: 98% 96% 97%   Weight:      Height:        Examination:  GENERAL: No apparent distress.  Nontoxic. HEENT: MMM.  Vision and hearing grossly intact.  NECK: Supple.  No apparent JVD.  RESP:  No IWOB.  Fair aeration bilaterally. CVS:  RRR. Heart sounds normal.  ABD/GI/GU: BS+. Abd soft, NTND.  MSK/EXT:  Moves extremities. No apparent deformity. No edema.  SKIN: no apparent skin lesion or wound NEURO: Awake and alert.  Oriented to self and person but not place and time.  Somewhat confused about situation.  No apparent focal neuro deficit. PSYCH: Calm.  No distress or agitation.  Procedures:  12/29-1/1 intubation and mechanical ventilation  Microbiology summarized: COVID-19, influenza and RSV PCR nonreactive. MRSA PCR screen negative. Blood cultures NGTD  Assessment and plan: Principal Problem:   Acute respiratory failure with hypoxia (HCC) Active Problems:   Essential hypertension   ESRD (end stage renal disease) (HCC)   Multifocal pneumonia   Anemia in chronic kidney disease (CKD)  Acute metabolic acidosis   Delirium   Acute on chronic combined systolic and diastolic CHF (congestive heart failure) (HCC)   Non-STEMI (non-ST elevated myocardial infarction) (HCC)   Goals of care, counseling/discussion  Acute hypoxemic respiratory failure due to multifocal pneumonia: CT chest, abdomen and pelvis with patchy airspace disease in both lungs suggesting multifocal pneumonia, moderate right effusion and prominent mediastinal lymph nodes.  Culture data as  above.  Respiratory failure resolved.  Completed antibiotic and steroid course. -Pulmonary toilet with incentive spirometry -Mucolytic's and antitussives.  Agitation/delirium/insomnia:  He is awake and alert but only oriented to self and family.  -Decreased Seroquel to 25 mg nightly on 1/9. -IV Haldol 1 mg every 6 hours as needed -Monitor QTc, 458 on 1/5. -Optimize electrolytes -Reorientation and delirium precaution -Palliative medicine consulted.   Non-STEMI/multivessel CAD: EKG EKG showed diffuse STD except in aVR and V1 that has ST elevation. Echo showed LVEF 40 to 45%. Troponin trended from [id number] (peak).  Patient without chest pain. LHC on 1/8 with multivessel CAD with most severe lesion in mid LAD with 80 to 90% stenosis.  PCI unsuccessful on 1/8 (technical) and 1/9 (delirium).  Delirium improved. -Cardiology planning to reattempt PCI today (1/10). -Continue low-dose aspirin, Lipitor and Coreg -Palliative medicine consulted.  Acute combined CHF: TTE with LVEF of 40 to 45% and G1 DD.  Appears euvolemic as of now. -Fluid management by peritoneal dialysis per nephrology.  ESRD on peritoneal dialysis. Bone mineral disorder/hyperphosphatemia/hypocalcemia/hyponatremia -Peritoneal dialysis per nephrology  Anemia of renal disease: Transfused 1 unit on 1/4 with appropriate response.  No signs of bleeding.  H&H stable and better than baseline. Recent Labs    03/06/22 0313 03/07/22 0043 03/08/22 0553 03/08/22 1520 03/09/22 0731 03/10/22 0057 03/11/22 0052 03/12/22 0023 03/13/22 0719 03/14/22 0136  HGB 8.5* 9.6* 7.9* 9.8* 9.6* 10.5* 11.1* 11.1* 11.7* 12.2*  -Continue monitoring  Essential hypertension: Normotensive. -Cardiac meds as above  Generalized weakness/physical deconditioning -PT/OT-recommended HH PT/OT.  Hypokalemia: Resolved.  Constipation: Resolved. -Bowel regimen as needed  Coughing/gagging while eating: Improving. -SLP recommend regular diet with  aspiration precaution.  Nausea/abdominal pain: Abdominal exam benign. -Antiemetic as needed  Goal of care: Patient is DNR/DNI.  He has delirium, multivessel CAD, combined CHF, ESRD.  He is physically deconditioned.  Overall prognosis is poor.  Refused essential procedure today after he went down to Cath Lab.  - -Palliative medicine consulted.  Body mass index is 22.78 kg/m.          DVT prophylaxis:  heparin injection 5,000 Units Start: 03/12/22 2200  Code Status: DNR/DNI Family Communication: Updated patient's wife at bedside. Level of care: Progressive Cardiac Status is: Inpatient Remains inpatient appropriate because: Multivessel CAD, delirium   Final disposition: TBD Consultants:  Pulmonology admitted patient Nephrology Cardiology  Sch Meds:  Scheduled Meds:  sodium chloride   Intravenous Once   aspirin  81 mg Oral Daily   atorvastatin  40 mg Oral Daily   carvedilol  3.125 mg Oral BID WC   Chlorhexidine Gluconate Cloth  6 each Topical Daily   clopidogrel  75 mg Oral Daily   [START ON 03/19/2022] darbepoetin (ARANESP) injection - NON-DIALYSIS  100 mcg Subcutaneous Q Mon-1800   gentamicin cream   Topical Daily   guaiFENesin  600 mg Oral BID   heparin  5,000 Units Subcutaneous Q8H   insulin aspart  1-3 Units Subcutaneous Q4H   insulin detemir  5 Units Subcutaneous Q12H   melatonin  3 mg Oral QHS   pantoprazole  40 mg Oral Daily   QUEtiapine  25 mg Oral QHS   sevelamer carbonate  800 mg Oral TID WC   sodium chloride flush  3 mL Intravenous Q12H   sodium chloride flush  3 mL Intravenous Q12H   sodium chloride flush  3 mL Intravenous Q12H   Continuous Infusions:  sodium chloride 10 mL/hr at 03/12/22 1057   sodium chloride     sodium chloride     sodium chloride 10 mL/hr at 03/13/22 1500   dialysis solution 4.25% low-MG/low-CA     dialysis solution 4.25% low-MG/low-CA     PRN Meds:.sodium chloride, sodium chloride, sodium chloride, acetaminophen **OR**  acetaminophen, calcium carbonate, guaiFENesin-dextromethorphan, haloperidol lactate, ipratropium-albuterol, metoprolol tartrate, mouth rinse, [COMPLETED] polyethylene glycol **FOLLOWED BY** polyethylene glycol, prochlorperazine, sodium chloride flush, sodium chloride flush, white petrolatum  Antimicrobials: Anti-infectives (From admission, onward)    Start     Dose/Rate Route Frequency Ordered Stop   03/03/22 2200  cefTRIAXone (ROCEPHIN) 2 g in sodium chloride 0.9 % 100 mL IVPB        2 g 200 mL/hr over 30 Minutes Intravenous Every 24 hours 03/03/22 1546 03/10/22 0028   03/03/22 2000  azithromycin (ZITHROMAX) 500 mg in sodium chloride 0.9 % 250 mL IVPB        500 mg 250 mL/hr over 60 Minutes Intravenous Every 24 hours 03/03/22 1546 03/06/22 2253   03/03/22 1700  vancomycin (VANCOREADY) IVPB 2000 mg/400 mL        2,000 mg 200 mL/hr over 120 Minutes Intravenous NOW 03/03/22 1608 03/04/22 1011   03/03/22 1611  vancomycin variable dose per unstable renal function (pharmacist dosing)  Status:  Discontinued         Does not apply See admin instructions 03/03/22 1611 03/06/22 0838   03/02/22 2300  cefTRIAXone (ROCEPHIN) 1 g in sodium chloride 0.9 % 100 mL IVPB        1 g 200 mL/hr over 30 Minutes Intravenous  Once 03/02/22 2245 03/02/22 2345   03/02/22 2300  azithromycin (ZITHROMAX) 500 mg in sodium chloride 0.9 % 250 mL IVPB        500 mg 250 mL/hr over 60 Minutes Intravenous  Once 03/02/22 2245 03/03/22 0108        I have personally reviewed the following labs and images: CBC: Recent Labs  Lab 03/10/22 0057 03/11/22 0052 03/12/22 0023 03/13/22 0719 03/14/22 0136  WBC 12.8* 12.6* 13.5* 12.6* 13.7*  NEUTROABS 10.7*  --   --   --   --   HGB 10.5* 11.1* 11.1* 11.7* 12.2*  HCT 30.8* 33.3* 34.1* 36.3* 38.6*  MCV 98.4 100.0 101.2* 103.1* 105.5*  PLT 340 318 309 305 304   BMP &GFR Recent Labs  Lab 03/08/22 1520 03/09/22 0731 03/10/22 0057 03/11/22 0052 03/12/22 0023  03/13/22 0719 03/14/22 0136  NA 135   < > 139 140 138 142 141  K 3.7   < > 3.2* 3.1* 4.2 5.1 4.5  CL 96*   < > 101 100 101 102 98  CO2 21*   < > '23 24 24 26 25  '$ GLUCOSE 129*   < > 146* 140* 129* 181* 195*  BUN 76*   < > 72* 64* 64* 63* 64*  CREATININE 6.89*   < > 7.15* 6.68* 6.90* 7.16* 7.53*  CALCIUM 8.3*   < > 8.7* 8.8* 8.4* 9.3 9.6  MG 2.2  --   --  2.1 2.1 2.4 2.4  PHOS 6.9*  --   --  5.0* 4.3 5.7* 5.1*   < > = values in this interval not displayed.   Estimated Creatinine Clearance: 8.4 mL/min (A) (by C-G formula based on SCr of 7.53 mg/dL (H)). Liver & Pancreas: Recent Labs  Lab 03/08/22 1520 03/11/22 0052 03/12/22 0023 03/13/22 0719 03/14/22 0136  AST 32  --   --   --   --   ALT 31  --   --   --   --   ALKPHOS 49  --   --   --   --   BILITOT 0.6  --   --   --   --   PROT 6.1*  --   --   --   --   ALBUMIN 2.8* 2.6* 2.5* 2.8* 3.2*   No results for input(s): "LIPASE", "AMYLASE" in the last 168 hours. No results for input(s): "AMMONIA" in the last 168 hours. Diabetic: No results for input(s): "HGBA1C" in the last 72 hours. Recent Labs  Lab 03/13/22 2134 03/14/22 0034 03/14/22 0440 03/14/22 0752 03/14/22 1214  GLUCAP 255* 198* 160* 121* 138*   Cardiac Enzymes: No results for input(s): "CKTOTAL", "CKMB", "CKMBINDEX", "TROPONINI" in the last 168 hours. No results for input(s): "PROBNP" in the last 8760 hours. Coagulation Profile: Recent Labs  Lab 03/12/22 0551  INR 1.1   Thyroid Function Tests: No results for input(s): "TSH", "T4TOTAL", "FREET4", "T3FREE", "THYROIDAB" in the last 72 hours. Lipid Profile: No results for input(s): "CHOL", "HDL", "LDLCALC", "TRIG", "CHOLHDL", "LDLDIRECT" in the last 72 hours. Anemia Panel: No results for input(s): "VITAMINB12", "FOLATE", "FERRITIN", "TIBC", "IRON", "RETICCTPCT" in the last 72 hours. Urine analysis:    Component Value Date/Time   COLORURINE YELLOW 12/30/2014 1820   APPEARANCEUR CLOUDY (A) 12/30/2014 1820    LABSPEC 1.013 12/30/2014 1820   PHURINE 5.5 12/30/2014 1820   GLUCOSEU NEGATIVE 12/30/2014 1820   HGBUR TRACE (A) 12/30/2014 1820   BILIRUBINUR NEGATIVE 12/30/2014 1820   KETONESUR NEGATIVE 12/30/2014 1820   PROTEINUR 100 (A) 12/30/2014 1820   UROBILINOGEN 0.2 12/30/2014 1820   NITRITE NEGATIVE 12/30/2014 1820   LEUKOCYTESUR LARGE (A) 12/30/2014 1820   Sepsis Labs: Invalid input(s): "PROCALCITONIN", "LACTICIDVEN"  Microbiology: Recent Results (from the past 240 hour(s))  MRSA Next Gen by PCR, Nasal     Status: None   Collection Time: 03/07/22  4:04 AM   Specimen: Nasal Mucosa; Nasal Swab  Result Value Ref Range Status   MRSA by PCR Next Gen NOT DETECTED NOT DETECTED Final    Comment: (NOTE) The GeneXpert MRSA Assay (FDA approved for NASAL specimens only), is one component of a comprehensive MRSA colonization surveillance program. It is not intended to diagnose MRSA infection nor to guide or monitor treatment for MRSA infections. Test performance is not FDA approved in patients less than 51 years old. Performed at [facility], [place] [address] [place], [place] [address]     Radiology Studies: No results found.    Jabori Henegar T. Bolivar  If 7PM-7AM, please contact night-coverage [email or web address] 03/14/2022, 2:43 PM

## 2022-03-14 NOTE — Progress Notes (Signed)
Orthopedic Tech Progress Note Patient Details:  William [name] [birth year] [id number]  Ortho Devices Type of Ortho Device: Knee Immobilizer Ortho Device/Splint Location: RLE Ortho Device/Splint Interventions: Ordered, Application, Adjustment   Post Interventions Patient Tolerated: Well Instructions Provided: Care of device, Adjustment of device  [facility] 03/14/2022, 10:29 PM

## 2022-03-14 NOTE — Progress Notes (Signed)
Mobility Specialist - Progress Note   03/14/22 1502  Mobility  Activity Ambulated with assistance in room  Level of Assistance Minimal assist, patient does 75% or more  Assistive Device Front wheel walker  Distance Ambulated (ft) 250 ft  Activity Response Tolerated well  Mobility Referral Yes  $Mobility charge 1 Mobility    During mobility:83 HR  Pt received in bed and agreeable to mobility. Pt was MinA throughout session. Pt c/o slight shakiness during ambulation. Pt was returned to bed with all needs met. RN notified.   William [name]  Mobility Specialist Please contact via Solicitor or Rehab office at [phone]

## 2022-03-14 NOTE — Progress Notes (Signed)
Pt had PD  which completed this a.m. Notified  dialysis staff this am. Patient has been increasingly  confused, agitated and combative with staff when receiving care. Numerous attempts to get out of bed during the shift. PRN meds helpful in addition to family support overnight.

## 2022-03-14 NOTE — Progress Notes (Signed)
  Pt returned from cathlab. s/p PCI. Right femoral sheath still in place and assessed with CathLab staff during handoff. Old drainage at site and reported by cathlab staff. New gauze dressing applied and secured with Tegaderm by cath lab staff. Refusing to lkeep right leg flat. Agitated, combative and trying to get out of bed. Multiple staff in room to assist.Family in room to provide assistance and emotional support. PRN haldol given for agitation. Tolerated well.

## 2022-03-14 NOTE — Progress Notes (Signed)
Nephrology Progress Note  Subjective:  Seen in room. Refused reattempt at [facility] yesterday. Daughter at bedside, has HCPOA.  We discussed importance of addressing cardiac issues.   Objective Vital signs in last 24 hours: Vitals:   03/12/22 1435 03/12/22 1440 03/12/22 1445 03/12/22 1450  BP:  109/71    Pulse: 74 75 80 77  Resp: (!) 32 (!) 30 (!) 24 18  Temp:      TempSrc:      SpO2: 91% 91% 97% 94%  Weight:      Height:       Physical Exam:    General adult male in bed in no acute distress Neck supple trachea midline Lungs clear bilaterally Heart S1S2 no rub Abdomen soft nontender distended; PD catheter in place and dressed Extremities no edema appreciated Neuro - alert, following commands   Summary: Pt is a 82 y.o. yo male with ESRD on PD who was admitted on 03/02/2022 with PNA but also felt to have NSTEMI  OP PD: f/b Dr Dimas Aguas from [facility]. 4 exchanges overnight, 2500 cc dwell vol, uses mostly 1.5% and 2.5% bags.  On PD for 3 years. Dry wt 181- 185  lbs per the pt.     Assessment/ Plan NSTEMI/ CAD - w/ tight LAD stenosis; unable to fix per cardiology 1/08 due to difficulties w/ anticoagulation. Refused repeat cath yesterday. Per cardiology Acute hypoxemic respiratory failure - dramatic perhilar infiltrates/ edema on 1/04 CXR, some of that may have been PNA. Have been using higher dextrose concentrations to pull more fluid. Got UF 700 cc off last night. Clinically no edema and wt's down today. Will lower UF to 1.5% and 2.5% tonight. Pt refused f/u CXR yesterday.  Right sided community acquired PNA-  was on vent for a while. Completed antibiotics  ESRD - on PD at home. Cont PD nightly.  Hypokalemia - K+ 5.1 today, dc po Kdur Anemia of CKD -  he is ordered aranesp 200 mcg weekly on Mondays and got the last dose.  Hb improved and is > 11 now. Lowered esa to 100 ug weekly on Monday.  Secondary hyperparathyroidism-  hyperphosphatemia however he states he has never been on a  binder and that phos is usually controlled.  Have started renvela for now for phos 4-6 range.  HTN -  controlled   Kelly Splinter, MD 03/14/2022, 1:57 PM  Recent Labs  Lab 03/13/22 0719 03/14/22 0136  HGB 11.7* 12.2*  ALBUMIN 2.8* 3.2*  CALCIUM 9.3 9.6  PHOS 5.7* 5.1*  CREATININE 7.16* 7.53*  K 5.1 4.5     Inpatient medications:  sodium chloride   Intravenous Once   aspirin  81 mg Oral Daily   atorvastatin  40 mg Oral Daily   carvedilol  3.125 mg Oral BID WC   Chlorhexidine Gluconate Cloth  6 each Topical Daily   clopidogrel  75 mg Oral Daily   [START ON 03/19/2022] darbepoetin (ARANESP) injection - NON-DIALYSIS  100 mcg Subcutaneous Q Mon-1800   gentamicin cream   Topical Daily   guaiFENesin  600 mg Oral BID   heparin  5,000 Units Subcutaneous Q8H   insulin aspart  1-3 Units Subcutaneous Q4H   insulin detemir  5 Units Subcutaneous Q12H   melatonin  3 mg Oral QHS   pantoprazole  40 mg Oral Daily   QUEtiapine  25 mg Oral QHS   sevelamer carbonate  800 mg Oral TID WC   sodium chloride flush  3 mL Intravenous Q12H  sodium chloride flush  3 mL Intravenous Q12H   sodium chloride flush  3 mL Intravenous Q12H    sodium chloride 10 mL/hr at 03/12/22 1057   sodium chloride     sodium chloride     sodium chloride 10 mL/hr at 03/13/22 1500   dialysis solution 4.25% low-MG/low-CA     dialysis solution 4.25% low-MG/low-CA     sodium chloride, sodium chloride, sodium chloride, acetaminophen **OR** acetaminophen, calcium carbonate, guaiFENesin-dextromethorphan, haloperidol lactate, ipratropium-albuterol, metoprolol tartrate, mouth rinse, [COMPLETED] polyethylene glycol **FOLLOWED BY** polyethylene glycol, prochlorperazine, sodium chloride flush, sodium chloride flush, white petrolatum

## 2022-03-14 NOTE — Progress Notes (Signed)
Physical Therapy Treatment Patient Details Name: William [name] MRN: [id number] DOB: 07-04-1940 Today's Date: 03/14/2022   History of Present Illness 82 y.o. male adm 03/02/2022 with cough, DOE, RUL PNA, NSTEMI. Intubated 12/30-1/1. Transfer out of ICU 1/2 and then back to ICU 1/3 due to confusion and agitation as well as resp distress.  PMHx: CHF, ESRD on PD, HTN, HLD, GERD.    PT Comments    Pt was seen for progression of mobility with strengthening to LE's and repositioning on his bed.  Pt is able to walk on the hallway but had just completed a walk and was a bit fatigued afterward.  Will recommend him to go home with HHPT and focus on LE strengthening, balance and gait quality during his stay in hospital.  Pt is attended by his children and very much aware of all the activities, with expectation to go home with HHPT.  Will agree with this plan as pt is moving well with good control of movement and safety.     Recommendations for follow up therapy are one component of a multi-disciplinary discharge planning process, led by the attending physician.  Recommendations may be updated based on patient status, additional functional criteria and insurance authorization.  Follow Up Recommendations  Home health PT     Assistance Recommended at Discharge Frequent or constant Supervision/Assistance  Patient can return home with the following A little help with walking and/or transfers;A little help with bathing/dressing/bathroom;Assistance with cooking/housework;Assist for transportation;Help with stairs or ramp for entrance   Equipment Recommendations  None recommended by PT    Recommendations for Other Services       Precautions / Restrictions Precautions Precautions: Fall Restrictions Weight Bearing Restrictions: No     Mobility  Bed Mobility Overal bed mobility: Needs Assistance Bed Mobility:  (scooting up the bed)           General bed mobility comments: remained in bed but max  of two to assist to scoot up in bed    Transfers                   General transfer comment: declined OOB    Ambulation/Gait                   Stairs             Wheelchair Mobility    Modified Rankin (Stroke Patients Only)       Balance                                            Cognition Arousal/Alertness: Awake/alert Behavior During Therapy: WFL for tasks assessed/performed Overall Cognitive Status: Within Functional Limits for tasks assessed                                          Exercises General Exercises - Lower Extremity Ankle Circles/Pumps: AAROM, AROM, 10 reps Gluteal Sets: AROM, 10 reps Heel Slides: AROM, 15 reps Hip ABduction/ADduction: AROM, 15 reps Hip Flexion/Marching: AROM, 10 reps    General Comments General comments (skin integrity, edema, etc.): pt was observed to have controlled vitals during session, with HR and RR observed, sats not connected      Pertinent Vitals/Pain Pain Assessment Pain Assessment: No/denies pain    Home  Living                          Prior Function            PT Goals (current goals can now be found in the care plan section) Progress towards PT goals: PT to reassess next treatment    Frequency    Min 3X/week      PT Plan Current plan remains appropriate    Co-evaluation              AM-PAC PT "6 Clicks" Mobility   Outcome Measure  Help needed turning from your back to your side while in a flat bed without using bedrails?: A Little Help needed moving from lying on your back to sitting on the side of a flat bed without using bedrails?: A Little Help needed moving to and from a bed to a chair (including a wheelchair)?: A Little Help needed standing up from a chair using your arms (e.g., wheelchair or bedside chair)?: A Little Help needed to walk in hospital room?: A Little Help needed climbing 3-5 steps with a railing? : A  Lot 6 Click Score: 17    End of Session   Activity Tolerance: Patient tolerated treatment well Patient left: in bed;with call bell/phone within reach;with bed alarm set;with family/visitor present Nurse Communication: Mobility status PT Visit Diagnosis: Unsteadiness on feet (R26.81);Difficulty in walking, not elsewhere classified (R26.2)     Time: 4628-6381 PT Time Calculation (min) (ACUTE ONLY): 23 min  Charges:  $Therapeutic Exercise: 8-22 mins $Therapeutic Activity: 8-22 mins  Ramond Dial 03/14/2022, 3:53 PM  Mee Hives, PT PhD Acute Rehab Dept. Number: [place] and [place]

## 2022-03-15 ENCOUNTER — Inpatient Hospital Stay (HOSPITAL_COMMUNITY): Payer: PPO

## 2022-03-15 ENCOUNTER — Encounter (HOSPITAL_COMMUNITY): Payer: Self-pay | Admitting: Cardiovascular Disease

## 2022-03-15 DIAGNOSIS — R410 Disorientation, unspecified: Secondary | ICD-10-CM | POA: Diagnosis not present

## 2022-03-15 DIAGNOSIS — J9601 Acute respiratory failure with hypoxia: Secondary | ICD-10-CM | POA: Diagnosis not present

## 2022-03-15 DIAGNOSIS — N186 End stage renal disease: Secondary | ICD-10-CM | POA: Diagnosis not present

## 2022-03-15 DIAGNOSIS — E871 Hypo-osmolality and hyponatremia: Secondary | ICD-10-CM | POA: Diagnosis not present

## 2022-03-15 LAB — BASIC METABOLIC PANEL
Anion gap: 16 — ABNORMAL HIGH (ref 5–15)
BUN: 74 mg/dL — ABNORMAL HIGH (ref 8–23)
CO2: 20 mmol/L — ABNORMAL LOW (ref 22–32)
Calcium: 8.4 mg/dL — ABNORMAL LOW (ref 8.9–10.3)
Chloride: 101 mmol/L (ref 98–111)
Creatinine, Ser: 8.36 mg/dL — ABNORMAL HIGH (ref 0.61–1.24)
GFR, Estimated: 6 mL/min — ABNORMAL LOW (ref >60)
Glucose, Bld: 102 mg/dL — ABNORMAL HIGH (ref 70–99)
Potassium: 4.9 mmol/L (ref 3.5–5.1)
Sodium: 137 mmol/L (ref 135–145)

## 2022-03-15 LAB — POCT ACTIVATED CLOTTING TIME
Activated Clotting Time: 174 seconds
Activated Clotting Time: 277 seconds
Activated Clotting Time: 487 seconds

## 2022-03-15 LAB — CBC
HCT: 32.9 % — ABNORMAL LOW (ref 39.0–52.0)
Hemoglobin: 10.4 g/dL — ABNORMAL LOW (ref 13.0–17.0)
MCH: 33.4 pg (ref 26.0–34.0)
MCHC: 31.6 g/dL (ref 30.0–36.0)
MCV: 105.8 fL — ABNORMAL HIGH (ref 80.0–100.0)
Platelets: 225 10*3/uL (ref 150–400)
RBC: 3.11 MIL/uL — ABNORMAL LOW (ref 4.22–5.81)
RDW: 13.8 % (ref 11.5–15.5)
WBC: 11.4 10*3/uL — ABNORMAL HIGH (ref 4.0–10.5)
nRBC: 0.4 % — ABNORMAL HIGH (ref 0.0–0.2)

## 2022-03-15 MED ORDER — METOPROLOL TARTRATE 25 MG PO TABS: 25.0000 mg | ORAL_TABLET | Freq: Two times a day (BID) | ORAL | 1 refills | Status: DC

## 2022-03-15 MED ORDER — EZETIMIBE 10 MG PO TABS
10.0000 mg | ORAL_TABLET | Freq: Every day | ORAL | Status: DC
  Administered 2022-03-15: 10 mg via ORAL
  Filled 2022-03-15: qty 1

## 2022-03-15 MED ORDER — EZETIMIBE 10 MG PO TABS: 10.0000 mg | ORAL_TABLET | Freq: Every day | ORAL | 0 refills | Status: DC

## 2022-03-15 MED ORDER — PANTOPRAZOLE SODIUM 40 MG PO TBEC: 40.0000 mg | DELAYED_RELEASE_TABLET | Freq: Every day | ORAL | 0 refills | Status: AC

## 2022-03-15 MED ORDER — ACETAMINOPHEN 325 MG PO TABS: 650.0000 mg | ORAL_TABLET | Freq: Four times a day (QID) | ORAL | Status: DC | PRN

## 2022-03-15 MED ORDER — ASPIRIN 81 MG PO CHEW: 81.0000 mg | CHEWABLE_TABLET | Freq: Every day | ORAL | 3 refills | Status: DC

## 2022-03-15 MED ORDER — CLOPIDOGREL BISULFATE 75 MG PO TABS: 75.0000 mg | ORAL_TABLET | Freq: Every day | ORAL | 3 refills | Status: DC

## 2022-03-15 MED ORDER — ONDANSETRON HCL 4 MG PO TABS: 4.0000 mg | ORAL_TABLET | Freq: Every day | ORAL | 0 refills | Status: DC | PRN

## 2022-03-15 MED ORDER — MELATONIN 3 MG PO TABS: 3.0000 mg | ORAL_TABLET | Freq: Every day | ORAL | 0 refills | Status: DC

## 2022-03-15 MED ORDER — SEVELAMER CARBONATE 800 MG PO TABS: 800.0000 mg | ORAL_TABLET | Freq: Three times a day (TID) | ORAL | 0 refills | Status: DC

## 2022-03-15 MED ORDER — ATORVASTATIN CALCIUM 20 MG PO TABS: 20.0000 mg | ORAL_TABLET | Freq: Every day | ORAL | 0 refills | Status: DC

## 2022-03-15 MED FILL — Verapamil HCl IV Soln 2.5 MG/ML: INTRAVENOUS | Qty: 2 | Status: AC

## 2022-03-15 NOTE — Progress Notes (Signed)
CARDIAC REHAB PHASE I    Pt in bed eating breakfast and drinking coffee. Feeling ok this morning, just a little tired and weak. Post Stent/MI education including site care, restrictions, risk factors, MI booklet, antiplatelet therapy importance, exercise guidelines, heart healthy diet and CRP2 reviewed with pt and son. All questions and concerns addressed. Will refer to [facility] for CRP2. Will continue to follow.   [id number]  Vanessa Barbara, RN BSN 03/15/2022 9:39 AM

## 2022-03-15 NOTE — Progress Notes (Signed)
Speech Language Pathology Treatment: Dysphagia  Patient Details Name: William [name] MRN: [id number] DOB: May 15, 1940 Today's Date: 03/15/2022 Time: 1975-8832 SLP Time Calculation (min) (ACUTE ONLY): 17 min  Assessment / Plan / Recommendation Clinical Impression  Patient seen for f/u diagnostic swallow treatment. Noted plans/recommendations for potential MBS however patient is planning to d/c this afternoon. Skilled observation of patient self feeding clinician provided pos (regular solids, pureed solids, thin liquids) revealed what appears to be normal oropharyngeal swallowing function. Patient does not report coughing with pos, but instead regurgitations of already swallowed bolus consistent with esophageal dysfunction. 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 At this time, do not feel that MBS is necessary at this time. Cannot h/o r/o post swallow or post prandial aspiration of regurgitated po which does increase risk of an aspiration related infection. Recommend quick f/u with GI after d/c. Patient and son aware. Education complete.    HPI HPI: 82 year old man with hx of combined CHF, ESRD on PD, HTN, HLD and BPH presenting with with productive cough, dyspnea on exertion and subjective fever and admitted to ICU for acute respiratory failure with hypoxia in the setting of multifocal pneumonia. Respiratory failure has resolved; however, pt with ongoing n/v after intake.      SLP Plan  All goals met      Recommendations for follow up therapy are one component of a multi-disciplinary discharge planning process, led by the attending physician.  Recommendations may be updated based on patient status, additional functional criteria and insurance authorization.    Recommendations  Diet recommendations: Regular;Thin liquid Liquids provided via: Cup;Straw Medication Administration: Whole meds with liquid Supervision: Patient able to self  feed Compensations: Small sips/bites;Slow rate Postural Changes and/or Swallow Maneuvers: Seated upright 90 degrees;Upright 30-60 min after meal                Oral Care Recommendations: Oral care BID Follow Up Recommendations: No SLP follow up SLP Visit Diagnosis: Dysphagia, unspecified (R13.10) Plan: All goals met         Gabriel Rainwater MA, CCC-SLP   Tishina Lown Meryl  03/15/2022, 1:10 PM

## 2022-03-15 NOTE — Progress Notes (Signed)
R  femeroal shealth removed per cath lab staff. P. Gauze dressing  clean/ dry with no bleeding. Vitals as charted. Will monitor per protocol   03/15/22 0211  Vitals  BP 107/76  MAP (mmHg) 88  BP Location Right Arm  BP Method Automatic  Patient Position (if appropriate) Lying  Pulse Rate 85  ECG Heart Rate 84  Resp 18  Level of Consciousness  Level of Consciousness Alert  MEWS COLOR  MEWS Score Color Green  Oxygen Therapy  SpO2 96 %  O2 Device Room Air  Pain Assessment  Pain Scale Faces  Faces Pain Scale 0  MEWS Score  MEWS Temp 0  MEWS Systolic 0  MEWS Pulse 0  MEWS RR 0  MEWS LOC 0  MEWS Score 0

## 2022-03-15 NOTE — Discharge Summary (Signed)
Physician Discharge Summary  William [name] HEN:[id number] DOB: 10-Jul-1940 DOA: 03/02/2022  PCP: [name], PA-C  Admit date: 03/02/2022 Discharge date: 03/15/2022 Admitted From: Home Disposition: Home Recommendations for Outpatient Follow-up:  Follow up with PCP in in 1 to 2 weeks Cardiology to arrange outpatient follow-up. Check blood pressure, CMP and CBC at follow-up Consider repeat chest x-ray in 4 to 6 weeks to ensure resolution of bilateral perihilar opacities. Consider outpatient referral to palliative care Please follow up on the following pending results: None  Home Health: Patient and family declined [facility] PT/OT. Equipment/Devices: None  Discharge Condition: Stable but guarded prognosis. CODE STATUS: DNR/DNI  Follow-up Information     Podraza, Sindy Messing, PA-C. Schedule an appointment as soon as possible for a visit in 1 week(s).   Specialty: Physician Assistant Contact information: [address] [place] [address] [phone]                 Hospital course 82 year old man with hx of combined CHF, ESRD on PD, HTN, HLD and BPH presenting with with productive cough, dyspnea on exertion and subjective fever and admitted to ICU for acute respiratory failure with hypoxia in the setting of multifocal pneumonia.  He was intubated from 12/29-1/1.  Started on IV ceftriaxone and azithromycin on 12/29, and vancomycin on 12/30.  He was transferred out of ICU on 1/2 but developed increasing agitation, confusion the night of 1/3 and started on Precedex drip and sent back to ICU.  EKG on 1/4 with increased ST/T changes in inferolateral leads.  He also had significantly elevated troponin, 800>>> 2700 (peak).  TTE with LVEF of 40 to 45% and G1-DD.  Cardiology consulted.    Eventually, delirium improved, came off Precedex drip and transferred to [facility] hospitalist service on 03/10/2021.  Completed antibiotic course on 03/10/2021.  Respiratory failure  resolved.     LHC on 1/8 with multivessel CAD with most severe lesion in mid LAD with 80 to 90% stenosis.  PCI aborted on 1/8 due to inability to achieve therapeutic anticoagulation, and on 1/9 due to delirium.  Eventually, he had DES to mid LAD on 1/10, and cleared for discharge by cardiology on DAPT with aspirin and Plavix at least for 1 year, metoprolol 25 mg twice daily, atorvastatin 20 mg daily and Zetia 10 mg daily on 1/11.  Cardiology to arrange outpatient follow-up.  Patient had intermittent delirium and sundowning that has improved the day of discharge.  He was oriented x 4 except date and year.   See individual problem list below for more.   Problems addressed during this hospitalization Principal Problem:   Acute respiratory failure with hypoxia ([place]) Active Problems:   Essential hypertension   ESRD (end stage renal disease) (H[facility])   Multifocal pneumonia   Anemia in chronic kidney disease (CKD)   Acute metabolic acidosis   Delirium   Acute on chronic combined systolic and diastolic CHF (congestive heart failure) (HCC)   Non-STEMI (non-ST elevated myocardial infarction) ([place])   Goals of care, counseling/discussion              Vital signs Vitals:   03/15/22 0900 03/15/22 1000 03/15/22 1100 03/15/22 1150  BP: 115/69 114/72 116/74 97/60  Pulse:      Temp:    97.6 F (36.4 C)  Resp:    18  Height:      Weight:      SpO2:      TempSrc:    Oral  BMI (Calculated):         Discharge exam  GENERAL: Appears frail. HEENT: MMM.  Vision and hearing grossly intact.  NECK: Supple.  No apparent JVD.  RESP:  No IWOB.  Fair aeration bilaterally. CVS:  RRR. Heart sounds normal.  ABD/GI/GU: BS+. Abd soft, NTND.  Peritoneal catheter in place. MSK/EXT:  Moves extremities. No apparent deformity. No edema.  SKIN: no apparent skin lesion or wound NEURO: Awake and oriented x 4 except date and year.  No apparent focal neuro deficit. PSYCH: Calm. Normal affect.   Discharge  Instructions Discharge Instructions     (HEART FAILURE PATIENTS) Call MD:  Anytime you have any of the following symptoms: 1) 3 pound weight gain in 24 hours or 5 pounds in 1 week 2) shortness of breath, with or without a dry hacking cough 3) swelling in the hands, feet or stomach 4) if you have to sleep on extra pillows at night in order to breathe.   Complete by: As directed    Amb Referral to Cardiac Rehabilitation   Complete by: As directed    Diagnosis:  Coronary Stents NSTEMI     After initial evaluation and assessments completed: Virtual Based Care may be provided alone or in conjunction with Phase 2 Cardiac Rehab based on patient barriers.: Yes   Intensive Cardiac Rehabilitation (ICR) [place] location only OR Traditional Cardiac Rehabilitation (TCR) *If criteria for ICR are not met will enroll in TCR [facility] only): Yes   Call MD for:  difficulty breathing, headache or visual disturbances   Complete by: As directed    Call MD for:  extreme fatigue   Complete by: As directed    Call MD for:  persistant dizziness or light-headedness   Complete by: As directed    Call MD for:  severe uncontrolled pain   Complete by: As directed    Diet - low sodium heart healthy   Complete by: As directed    Discharge instructions   Complete by: As directed    It has been a pleasure taking care of you!  You were hospitalized due to pneumonia, heart failure on heart attack for which you have been treated surgically medically.  We have started you on new heart medications as recommended by cardiology.  Please review your new medication list before you take them.  Follow-up with your primary care doctor in 1 to 2 weeks or sooner if needed.  Follow-up with cardiology per their recommendation.   Take care,   Discharge wound care:   Complete by: As directed    Peritoneal catheter exit site care Clean skin near exit site with chloraprep swab sticks.  Starting at catheter, use circular pattern around exit site,  moving towards outer edges of area covered by dressing.  Apply gentamicin cream to site once daily.  Cover with dry dressing.   Increase activity slowly   Complete by: As directed       Allergies as of 03/15/2022       Reactions   Nsaids Other (See Comments)   Chronic kidney disease   Valacyclovir Nausea And Vomiting   Lisinopril Rash        Medication List     STOP taking these medications    amLODipine 10 MG tablet Commonly known as: NORVASC   doxazosin 2 MG tablet Commonly known as: CARDURA   losartan 50 MG tablet Commonly known as: COZAAR   omeprazole 20 MG capsule Commonly known as: PRILOSEC   torsemide 20  MG tablet Commonly known as: DEMADEX       TAKE these medications    acetaminophen 325 MG tablet Commonly known as: TYLENOL Take 2 tablets (650 mg total) by mouth every 6 (six) hours as needed for headache or mild pain.   aspirin 81 MG chewable tablet Chew 1 tablet (81 mg total) by mouth daily. Start taking on: March 16, 2022   atorvastatin 20 MG tablet Commonly known as: LIPITOR Take 1 tablet (20 mg total) by mouth daily. Start taking on: March 16, 2022   clopidogrel 75 MG tablet Commonly known as: PLAVIX Take 1 tablet (75 mg total) by mouth daily. Start taking on: March 16, 2022   ezetimibe 10 MG tablet Commonly known as: ZETIA Take 1 tablet (10 mg total) by mouth daily. Start taking on: March 16, 2022   melatonin 3 MG Tabs tablet Take 1 tablet (3 mg total) by mouth at bedtime.   metoprolol tartrate 25 MG tablet Commonly known as: LOPRESSOR Take 1 tablet (25 mg total) by mouth 2 (two) times daily.   ondansetron 4 MG tablet Commonly known as: Zofran Take 1 tablet (4 mg total) by mouth daily as needed for up to 365 doses for nausea or vomiting.   pantoprazole 40 MG tablet Commonly known as: PROTONIX Take 1 tablet (40 mg total) by mouth daily. Start taking on: March 16, 2022   sevelamer carbonate 800 MG tablet Commonly  known as: RENVELA Take 1 tablet (800 mg total) by mouth 3 (three) times daily with meals.               Discharge Care Instructions  (From admission, onward)           Start     Ordered   03/15/22 0000  Discharge wound care:       Comments: Peritoneal catheter exit site care Clean skin near exit site with chloraprep swab sticks.  Starting at catheter, use circular pattern around exit site, moving towards outer edges of area covered by dressing.  Apply gentamicin cream to site once daily.  Cover with dry dressing.   03/15/22 1100            Consultations: Pulmonology admitted patient. Nephrology Cardiology  Procedures/Studies:  12/29-1/1-intubation and mechanical ventilation. 1/8-LHC but PCI intervention aborted. 1/11-LHC with successful DES to mid LAD   DG CHEST PORT 1 VIEW  Result Date: 03/15/2022 CLINICAL DATA:  Follow-up exam. EXAM: PORTABLE CHEST 1 VIEW COMPARISON:  CXR 03/08/22 FINDINGS: No pleural effusion. No pneumothorax. Unchanged enlarged cardiac contours. Redemonstrated bilateral perihilar pulmonary opacities, improved from prior exam, and possibly suggestive improving infection or pulmonary edema. No displaced rib fractures. Visualized upper abdomen is unremarkable. IMPRESSION: Redemonstrated bilateral perihilar pulmonary opacities, improved from prior exam, and possibly suggestive of improving infection or pulmonary edema. Electronically Signed   By: Marin Roberts M.D.   On: 03/15/2022 10:12   CARDIAC CATHETERIZATION  Result Date: 03/14/2022   Mid LAD lesion is 85% stenosed.   Dist Cx lesion is 60% stenosed.   Prox Cx to Mid Cx lesion is 20% stenosed.   1st Diag lesion is 70% stenosed.   A drug-eluting stent was successfully placed.   Post intervention, there is a 0% residual stenosis. Successful percutaneous coronary intervention to an 85 to 90% mildly tortuous mid LAD stenosis rated with PTCA, Cutting Balloon, and ultimate stenting with a 3.0 x 18 mm Onyx  Frontier DES stent postdilated to 3.25 mm with the stenosis being reduced to 0%.  RECOMMENDATION: DAPT for minimum of 1 year.  Medical therapy for concomitant CAD.  The patient is on peritoneal dialysis.  Aggressive lipid-lowering therapy with target LDL less than 55.   CARDIAC CATHETERIZATION  Result Date: 03/12/2022 Conclusions: Multivessel coronary artery disease, as detailed below.  Most severe lesion is an 80-90% mid LAD stenosis.  There is also moderate to severe disease involving small branches (D1 and rPDA) as well as moderate stenosis affecting distal LCx and rPLAV. Moderately elevated left ventricular filling pressure (LVEDP 25-30 mmHg). Aborted attempt at PCI of the mid LAD due to inability to achieve therapeutic anticoagulation despite administration of 16,000 unit of IV heparin. Recommendations: Consider PCI to mid LAD as soon as tomorrow using alternative intraprocedural anticoagulation strategy (i.e. bivalirudin). Dual antiplatelet therapy with aspirin and clopidogrel for at least 12 months. Consider escalation of fluid removal with peritoneal dialysis in the setting of moderately elevated LVEDP. Aggressive secondary prevention of coronary artery disease. Nelva Bush, MD [facility]  CT CHEST ABDOMEN PELVIS WO CONTRAST  Result Date: 03/08/2022 CLINICAL DATA:  82 year old with shortness of breath and abdominal pain. End-stage renal disease on peritoneal dialysis. EXAM: CT CHEST, ABDOMEN AND PELVIS WITHOUT CONTRAST TECHNIQUE: Multidetector CT imaging of the chest, abdomen and pelvis was performed following the standard protocol without IV contrast. RADIATION DOSE REDUCTION: This exam was performed according to the departmental dose-optimization program which includes automated exposure control, adjustment of the mA and/or kV according to patient size and/or use of iterative reconstruction technique. COMPARISON:  Chest radiograph 03/08/2022 and CT chest abdomen and pelvis 01/21/2019 FINDINGS:  CT CHEST FINDINGS Cardiovascular: Extensive coronary artery calcifications. Heart size is slightly enlarged. No significant pericardial effusion. Normal caliber of the thoracic aorta with atherosclerotic calcifications. Mediastinum/Nodes: Prominent mediastinal lymph nodes. Right paratracheal lymph node on sequence 3 image 19 measures up to 1.5 cm in the short axis and previously measured 0.7 cm. Limited evaluation for hilar lymph node enlargement due to the lack of intravascular contrast. No axillary lymph node enlargement. Lungs/Pleura: Small left pleural effusion and moderate sized right pleural effusion. Patchy confluent airspace densities in both lungs, most prominent in the upper lobes. Findings are suggestive for multifocal pneumonia. Musculoskeletal: No acute bone abnormality. CT ABDOMEN PELVIS FINDINGS Hepatobiliary: Small amount of perihepatic ascites. Cholecystectomy. No gross abnormality to the liver. Pancreas: Unremarkable. No pancreatic ductal dilatation or surrounding inflammatory changes. Spleen: Limited evaluation due to motion artifact. Small amount of perisplenic ascites. Adrenals/Urinary Tract: Normal appearance of the adrenal glands. Both kidneys are atrophic without hydronephrosis. Normal appearance of the urinary bladder containing fluid. Small renal lesions likely represent cysts and do not require dedicated follow-up. Stomach/Bowel: Diverticula involving the duodenum near the ampulla. No evidence for bowel dilatation or obstruction. Colonic diverticula without acute bowel inflammation. Vascular/Lymphatic: Diffuse atherosclerotic calcifications in the abdominal aorta and iliac arteries. Negative for abdominal aortic aneurysm. No lymph node enlargement in the abdomen or pelvis. Reproductive: Prostate is prominent for size. Normal appearance of seminal vesicles. Other: Peritoneal dialysis catheter is coiled in the right anterior pelvis. Small amount of ascites or free fluid in the pelvis.  Negative for free air. Again noted is a lobulated soft tissue structure containing fat in the right anterior abdomen measuring up to 7.5 cm on image 73/3. This is a chronic finding and probably represent prior inflammation or infarction in the omentum. Left inguinal hernia containing fat. Musculoskeletal: No acute bone abnormality. IMPRESSION: 1. Patchy airspace disease in both lungs is suggestive for multifocal pneumonia. 2. Bilateral pleural effusions,  right side greater than left. 3. Small amount of ascites associated with the peritoneal dialysis catheter. 4. Prominent mediastinal lymph nodes are likely reactive but nonspecific. 5. Aortic Atherosclerosis (ICD10-I70.0). 6. 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 Electronically Signed   By: Markus Daft M.D.   On: 03/08/2022 17:06   DG Abd Portable 1V  Result Date: 03/08/2022 CLINICAL DATA:  Dyspnea EXAM: PORTABLE ABDOMEN - 1 VIEW COMPARISON:  03/04/2022 FINDINGS: Bowel gas pattern is nonspecific. Peritoneal dialysis catheter is seen with its tip in the right side of pelvis. Arterial calcifications are seen, especially prominent in tortuous splenic artery. Surgical clips are seen in right upper quadrant. IMPRESSION: Nonspecific bowel gas pattern. Electronically Signed   By: Elmer Picker M.D.   On: 03/08/2022 16:02   DG CHEST PORT 1 VIEW  Result Date: 03/08/2022 CLINICAL DATA:  Dyspnea EXAM: PORTABLE CHEST 1 VIEW COMPARISON:  Previous studies including the examination of 03/07/2022 FINDINGS: Transverse diameter of heart is increased. There is interval worsening of pulmonary vascular congestion. Increased interstitial and alveolar markings are seen in parahilar regions and lower lung fields. Lateral CP angles are clear. There is no pneumothorax. IMPRESSION: Cardiomegaly. Central pulmonary vessels are prominent suggesting CHF. Possibility of underlying pneumonia is not excluded. Electronically Signed    By: Elmer Picker M.D.   On: 03/08/2022 16:01   DG CHEST PORT 1 VIEW  Result Date: 03/07/2022 CLINICAL DATA:  Acute respiratory failure EXAM: PORTABLE CHEST 1 VIEW COMPARISON:  03/06/2022 FINDINGS: Stable perihilar airspace opacities. Borderline enlargement of the cardiopericardial silhouette. Minimal blunting of the left lateral costophrenic angle. Right lateral seventh rib deformity compatible with age-indeterminate fracture. Atherosclerotic calcification of the aortic arch. IMPRESSION: 1. Stable perihilar airspace opacities favoring pulmonary edema over pneumonia. 2. Borderline enlargement of the cardiopericardial silhouette. 3. Right lateral seventh rib deformity compatible with age-indeterminate fracture. 4. Minimal blunting of the left lateral costophrenic angle, possibly from a trace left pleural effusion or pleural thickening. Electronically Signed   By: Van Clines M.D.   On: 03/07/2022 09:34   DG CHEST PORT 1 VIEW  Result Date: 03/06/2022 CLINICAL DATA:  Shortness of breath. EXAM: PORTABLE CHEST 1 VIEW COMPARISON:  03/04/2022 FINDINGS: Interval removal of left IJ catheter, endotracheal tube catheter endotracheal tube, and enteric tube. Stable cardiac enlargement. Multifocal airspace disease throughout the right lung is unchanged from previous exam. New left upper lobe airspace opacity. Partial improved aeration to the left lung base. Small pleural effusions suspected. IMPRESSION: 1. Interval removal of support apparatus. 2. New left upper lobe airspace opacity. 3. Improved aeration to the left lung base. Electronically Signed   By: Kerby Moors M.D.   On: 03/06/2022 18:13   DG CHEST PORT 1 VIEW  Result Date: 03/05/2022 CLINICAL DATA:  Respiratory failure EXAM: PORTABLE CHEST 1 VIEW COMPARISON:  03/04/2022 FINDINGS: Diffuse opacity right hemithorax again noted. Left base consolidation. Vascular congestion. Probable left-sided pleural effusion. No pneumothorax identified. NG tube  below the diaphragm and off x-ray. Endotracheal tube tip just below thoracic inlet. Left IJ CVC tip distal SVC. IMPRESSION: Diffuse opacity right hemithorax and left base consolidation or volume loss. Electronically Signed   By: Sammie Bench M.D.   On: 03/05/2022 11:11   DG Abd 1 View  Result Date: 03/04/2022 CLINICAL DATA:  Peritoneal dialysis EXAM: ABDOMEN - 1 VIEW COMPARISON:  03/03/2022 FINDINGS: Enteric tube is coiled within the stomach. Tip of the peritoneal dialysis catheter is coiled within the  low right pelvis. Nonobstructive bowel gas pattern. Cholecystectomy clips. Calcified splenic artery. IMPRESSION: Tip of the peritoneal dialysis catheter is coiled within the low right pelvis. Electronically Signed   By: Davina Poke D.O.   On: 03/04/2022 14:12   DG Chest Port 1 View  Result Date: 03/04/2022 CLINICAL DATA:  Endotracheal tube in-situ. EXAM: PORTABLE CHEST 1 VIEW COMPARISON:  One-view chest x-ray 03/03/2022 FINDINGS: Heart is enlarged. Atherosclerotic changes are present the aortic arch. Endotracheal tube is stable, 5 cm above the carina. Left IJ line terminates at the cavoatrial junction. Diffuse interstitial and airspace opacities are present throughout the right lung, increasing in the right lower lobe. Perihilar opacities bilaterally are similar the prior study. Retrocardiac opacity is increasing. Bilateral pleural effusions are increasing. IMPRESSION: 1. Increasing interstitial and airspace opacities bilaterally consistent with multifocal pneumonia and probable pulmonary edema. 2. Increasing bilateral effusions. 3. Stable support apparatus. Electronically Signed   By: San Morelle M.D.   On: 03/04/2022 09:53   ECHOCARDIOGRAM COMPLETE  Result Date: 03/04/2022    ECHOCARDIOGRAM REPORT   Patient Name:   William [name] Date of Exam: 03/04/2022 Medical Rec #:  [id number]      Height:       72.0 in Accession #:    [id number]     Weight:       190.9 lb Date of Birth:   [birth year]     BSA:          2.089 m Patient Age:    22 years       BP:           98/58 mmHg Patient Gender: M              HR:           51 bpm. Exam Location:  Inpatient Procedure: 2D Echo, Cardiac Doppler and Color Doppler                       STAT ECHO Reported to: Dr Phineas Inches on 03/04/2022 7:47:00 AM. Indications:    Cardiomyopathy-ischemic  History:        Patient has prior history of Echocardiogram examinations, most                 recent 03/29/2011. Risk Factors:Hypertension and Dyslipidemia.                 ESRD.  Sonographer:    Clayton Lefort RDCS (AE) Referring Phys: Frederik Pear  Sonographer Comments: Echo performed with patient supine and on artificial respirator. IMPRESSIONS  1. Left ventricular ejection fraction, by estimation, is 40 to 45%. The left ventricle has mildly decreased function. The left ventricle demonstrates global hypokinesis. There is mild left ventricular hypertrophy. Left ventricular diastolic parameters are consistent with Grade I diastolic dysfunction (impaired relaxation).  2. Right ventricular systolic function is mildly reduced. The right ventricular size is normal. Tricuspid regurgitation signal is inadequate for assessing PA pressure.  3. Moderate pleural effusion.  4. Mild mitral valve regurgitation.  5. Aortic valve regurgitation is mild.  6. The inferior vena cava is normal in size with <50% respiratory variability, suggesting right atrial pressure of 8 mmHg. Comparison(s): No prior Echocardiogram. FINDINGS  Left Ventricle: Left ventricular ejection fraction, by estimation, is 40 to 45%. The left ventricle has mildly decreased function. The left ventricle demonstrates global hypokinesis. The left ventricular internal cavity size was normal in size. There is  mild left ventricular hypertrophy. Left ventricular diastolic parameters  are consistent with Grade I diastolic dysfunction (impaired relaxation). Right Ventricle: The right ventricular size is normal. Right  ventricular systolic function is mildly reduced. Tricuspid regurgitation signal is inadequate for assessing PA pressure. Left Atrium: Left atrial size was normal in size. Right Atrium: Right atrial size was normal in size. Pericardium: There is no evidence of pericardial effusion. Mitral Valve: Mild mitral valve regurgitation. Tricuspid Valve: Tricuspid valve regurgitation is not demonstrated. Aortic Valve: Aortic valve regurgitation is mild. Aortic regurgitation PHT measures 775 msec. Aortic valve mean gradient measures 4.0 mmHg. Aortic valve peak gradient measures 8.0 mmHg. Aortic valve area, by VTI measures 3.31 cm. Pulmonic Valve: Pulmonic valve regurgitation is mild. Aorta: The aortic root and ascending aorta are structurally normal, with no evidence of dilitation. Venous: The inferior vena cava is normal in size with less than 50% respiratory variability, suggesting right atrial pressure of 8 mmHg. IAS/Shunts: The interatrial septum was not well visualized. Additional Comments: There is a moderate pleural effusion.  LEFT VENTRICLE PLAX 2D LVIDd:         6.50 cm      Diastology LVIDs:         4.90 cm      LV e' medial:    3.59 cm/s LV PW:         2.10 cm      LV E/e' medial:  17.0 LV IVS:        1.40 cm      LV e' lateral:   5.44 cm/s LVOT diam:     2.50 cm      LV E/e' lateral: 11.2 LV SV:         95 LV SV Index:   45 LVOT Area:     4.91 cm  LV Volumes (MOD) LV vol d, MOD A2C: 258.0 ml LV vol d, MOD A4C: 186.0 ml LV vol s, MOD A2C: 139.0 ml LV vol s, MOD A4C: 112.0 ml LV SV MOD A2C:     119.0 ml LV SV MOD A4C:     186.0 ml LV SV MOD BP:      100.0 ml RIGHT VENTRICLE            IVC RV Basal diam:  3.20 cm    IVC diam: 2.10 cm RV S prime:     7.72 cm/s TAPSE (M-mode): 1.4 cm LEFT ATRIUM              Index        RIGHT ATRIUM           Index LA diam:        3.60 cm  1.72 cm/m   RA Area:     18.10 cm LA Vol (A2C):   101.0 ml 48.35 ml/m  RA Volume:   47.70 ml  22.84 ml/m LA Vol (A4C):   54.5 ml  26.09 ml/m  LA Biplane Vol: 74.6 ml  35.71 ml/m  AORTIC VALVE AV Area (Vmax):    3.34 cm AV Area (Vmean):   3.54 cm AV Area (VTI):     3.31 cm AV Vmax:           141.00 cm/s AV Vmean:          87.000 cm/s AV VTI:            0.286 m AV Peak Grad:      8.0 mmHg AV Mean Grad:      4.0 mmHg LVOT Vmax:  95.90 cm/s LVOT Vmean:        62.700 cm/s LVOT VTI:          0.193 m LVOT/AV VTI ratio: 0.67 AI PHT:            775 msec  AORTA Ao Root diam: 3.50 cm Ao Asc diam:  3.80 cm MITRAL VALVE MV Area (PHT): 1.96 cm    SHUNTS MV Decel Time: 388 msec    Systemic VTI:  0.19 m MV E velocity: 61.20 cm/s  Systemic Diam: 2.50 cm Phineas Inches Electronically signed by Phineas Inches Signature Date/Time: 03/04/2022/8:04:15 AM    Final    DG Abd 1 View  Result Date: 03/03/2022 CLINICAL DATA:  Orogastric tube placement EXAM: ABDOMEN - 1 VIEW COMPARISON:  08/30/2021 FINDINGS: Distal course of orogastric tube is coiled within the fundus of the stomach. Tip of enteric tube is seen in the region of antrum of the stomach. There are surgical clips in right upper quadrant. Bowel gas pattern in the upper abdomen is unremarkable. Lower abdomen and pelvis are not included in the image. There is a catheter in the mid abdomen suggesting possible peritoneal dialysis catheter. IMPRESSION: Tip of enteric tube is seen in the antrum of the stomach. Electronically Signed   By: Elmer Picker M.D.   On: 03/03/2022 18:48   DG CHEST PORT 1 VIEW  Result Date: 03/03/2022 CLINICAL DATA:  Evaluate endotracheal tube and enteric tube location EXAM: PORTABLE CHEST 1 VIEW COMPARISON:  Previous studies including the examination done earlier today FINDINGS: Transverse diameter of heart is increased. Tip of endotracheal tube is 5.6 cm above the carina. There is interval placement of left IJ central venous catheter with its tip in superior vena cava. Enteric tube is noted traversing the esophagus. There is large alveolar infiltrate in right upper lung field with  possible interval worsening. There are faint alveolar densities in both parahilar regions and lower lung fields suggesting multifocal pneumonia and possibly pulmonary edema. There is blunting of lateral CP angles. There is no pneumothorax. IMPRESSION: Dense alveolar infiltrate is seen in right upper lobe. There are alveolar densities in both parahilar regions and both lower lung fields with possible interval worsening suggesting multifocal pneumonia and possibly pulmonary edema. Small bilateral pleural effusions. Support devices as described in the body of the report. Electronically Signed   By: Elmer Picker M.D.   On: 03/03/2022 18:46   DG CHEST PORT 1 VIEW  Result Date: 03/03/2022 CLINICAL DATA:  Shortness of breath and cough. EXAM: PORTABLE CHEST 1 VIEW COMPARISON:  12/31/2021 FINDINGS: RIGHT UPPER lobe consolidation/airspace opacities noted, stable to slightly increased. New bilateral airspace opacities within the remainder of the lungs noted and may represent pneumonia and/or edema. There may be trace bilateral pleural effusions present. Cardiomediastinal silhouette is not significantly changed. Probable bibasilar atelectasis noted. IMPRESSION: 1. RIGHT UPPER lobe consolidation/airspace opacities, likely representing pneumonia. 2. New bilateral airspace opacities within the remainder of the lungs which may represent pneumonia and/or edema. Electronically Signed   By: Margarette Canada M.D.   On: 03/03/2022 15:05   DG Chest 2 View  Result Date: 03/02/2022 CLINICAL DATA:  Dyspnea EXAM: CHEST - 2 VIEW COMPARISON:  03/11/2021 FINDINGS: There is consolidation within the right upper lobe in keeping with changes of acute lobar pneumonia in the appropriate clinical setting. Left lung is clear. No pneumothorax or pleural effusion. Cardiac size within normal limits. No acute bone abnormality. IMPRESSION: 1. Right upper lobe pneumonia. Follow-up chest radiograph is recommended in  3-4 weeks to document  resolution. Electronically Signed   By: Fidela Salisbury M.D.   On: 03/02/2022 22:00       The results of significant diagnostics from this hospitalization (including imaging, microbiology, ancillary and laboratory) are listed below for reference.     Microbiology: Recent Results (from the past 240 hour(s))  MRSA Next Gen by PCR, Nasal     Status: None   Collection Time: 03/07/22  4:04 AM   Specimen: Nasal Mucosa; Nasal Swab  Result Value Ref Range Status   MRSA by PCR Next Gen NOT DETECTED NOT DETECTED Final    Comment: (NOTE) The GeneXpert MRSA Assay (FDA approved for NASAL specimens only), is one component of a comprehensive MRSA colonization surveillance program. It is not intended to diagnose MRSA infection nor to guide or monitor treatment for MRSA infections. Test performance is not FDA approved in patients less than 51 years old. Performed at [facility], [place] [address], [place], [place] [address]      Labs:  CBC: Recent Labs  Lab 03/10/22 0057 03/11/22 0052 03/12/22 0023 03/13/22 0719 03/14/22 0136 03/15/22 0144  WBC 12.8* 12.6* 13.5* 12.6* 13.7* 11.4*  NEUTROABS 10.7*  --   --   --   --   --   HGB 10.5* 11.1* 11.1* 11.7* 12.2* 10.4*  HCT 30.8* 33.3* 34.1* 36.3* 38.6* 32.9*  MCV 98.4 100.0 101.2* 103.1* 105.5* 105.8*  PLT 340 318 309 305 304 225   BMP &GFR Recent Labs  Lab 03/08/22 1520 03/09/22 0731 03/11/22 0052 03/12/22 0023 03/13/22 0719 03/14/22 0136 03/15/22 0144  NA 135   < > 140 138 142 141 137  K 3.7   < > 3.1* 4.2 5.1 4.5 4.9  CL 96*   < > 100 101 102 98 101  CO2 21*   < > '24 24 26 25 '$ 20*  GLUCOSE 129*   < > 140* 129* 181* 195* 102*  BUN 76*   < > 64* 64* 63* 64* 74*  CREATININE 6.89*   < > 6.68* 6.90* 7.16* 7.53* 8.36*  CALCIUM 8.3*   < > 8.8* 8.4* 9.3 9.6 8.4*  MG 2.2  --  2.1 2.1 2.4 2.4  --   PHOS 6.9*  --  5.0* 4.3 5.7* 5.1*  --    < > = values in this interval not displayed.   Estimated Creatinine Clearance: 7.6 mL/min  (A) (by C-G formula based on SCr of 8.36 mg/dL (H)). Liver & Pancreas: Recent Labs  Lab 03/08/22 1520 03/11/22 0052 03/12/22 0023 03/13/22 0719 03/14/22 0136  AST 32  --   --   --   --   ALT 31  --   --   --   --   ALKPHOS 49  --   --   --   --   BILITOT 0.6  --   --   --   --   PROT 6.1*  --   --   --   --   ALBUMIN 2.8* 2.6* 2.5* 2.8* 3.2*   No results for input(s): "LIPASE", "AMYLASE" in the last 168 hours. No results for input(s): "AMMONIA" in the last 168 hours. Diabetic: Recent Labs    03/14/22 0134  HGBA1C 5.5   Recent Labs  Lab 03/14/22 0034 03/14/22 0440 03/14/22 0752 03/14/22 1214 03/14/22 1651  GLUCAP 198* 160* 121* 138* 104*   Cardiac Enzymes: No results for input(s): "CKTOTAL", "CKMB", "CKMBINDEX", "TROPONINI" in the last 168 hours.  No results for input(s): "PROBNP" in the last 8760 hours. Coagulation Profile: Recent Labs  Lab 03/12/22 0551  INR 1.1   Thyroid Function Tests: No results for input(s): "TSH", "T4TOTAL", "FREET4", "T3FREE", "THYROIDAB" in the last 72 hours. Lipid Profile: No results for input(s): "CHOL", "HDL", "LDLCALC", "TRIG", "CHOLHDL", "LDLDIRECT" in the last 72 hours. Anemia Panel: No results for input(s): "VITAMINB12", "FOLATE", "FERRITIN", "TIBC", "IRON", "RETICCTPCT" in the last 72 hours. Urine analysis:    Component Value Date/Time   COLORURINE YELLOW 12/30/2014 1820   APPEARANCEUR CLOUDY (A) 12/30/2014 1820   LABSPEC 1.013 12/30/2014 1820   PHURINE 5.5 12/30/2014 1820   GLUCOSEU NEGATIVE 12/30/2014 1820   HGBUR TRACE (A) 12/30/2014 1820   BILIRUBINUR NEGATIVE 12/30/2014 1820   KETONESUR NEGATIVE 12/30/2014 1820   PROTEINUR 100 (A) 12/30/2014 1820   UROBILINOGEN 0.2 12/30/2014 1820   NITRITE NEGATIVE 12/30/2014 1820   LEUKOCYTESUR LARGE (A) 12/30/2014 1820   Sepsis Labs: Invalid input(s): "PROCALCITONIN", "LACTICIDVEN"   SIGNED:  Mercy Riding, MD  [facility] 03/15/2022, 2:58 PM

## 2022-03-15 NOTE — Progress Notes (Signed)
PT got back from getting heart done, and is highly agitated. Call Dr. Royce Macadamia on call to make aware PT put off until tomorrow for PD tx.

## 2022-03-15 NOTE — Progress Notes (Signed)
Arrived to [phone] for sheath removal. Patient was restless at this time and shifting around in bed. 23f sheath was intact with no bleeding or hematoma palpable. However, there was some bruising noted around and distal to the sheath. Immobilizer noted to right leg upon arrival. Patient's daughter at bedside. 69fsheath was removed intact from right femoral artery. Manual pressure was applied x 3084m Vital signs remained stable throughout sheath pull. No bleeding or hematoma noted post sheath pull. 4x4 gauze and tegaderm dressing was applied. A small amount of hyperflex dressing was applied to add extra support to groin due to patient movement. Right DP pulse +2 palpable post sheath pull. Post activity and precautions explained to patient's family due to patient's confusion. Update provided and care released to [place]

## 2022-03-15 NOTE — Progress Notes (Signed)
Nephrology Progress Note  Subjective:  Seen in room. Had repeat LHC yest w/ successful PCI to the LAD lesions. Going home today. Did not get PD last night.   Objective Vital signs in last 24 hours: Vitals:   03/12/22 1435 03/12/22 1440 03/12/22 1445 03/12/22 1450  BP:  109/71    Pulse: 74 75 80 77  Resp: (!) 32 (!) 30 (!) 24 18  Temp:      TempSrc:      SpO2: 91% 91% 97% 94%  Weight:      Height:       Physical Exam:    General adult male in bed in no acute distress Neck supple trachea midline Lungs clear bilaterally Heart S1S2 no rub Abdomen soft nontender distended; PD catheter in place and dressed Extremities no edema appreciated Neuro - alert, following commands   Summary: Pt is a 82 y.o. yo male with ESRD on PD who was admitted on 03/02/2022 with PNA but also felt to have NSTEMI  OP PD: f/b Dr Dimas Aguas from [facility]. 4 exchanges overnight, 2500 cc dwell vol, uses mostly 1.5% and 2.5% bags.  On PD for 3 years. Dry wt 181- 185  lbs per the pt.     Assessment/ Plan NSTEMI/ CAD - w/ tight LAD stenosis; unable to fix per cardiology 1/08 due to difficulties w/ anticoagulation. Cath repeated yesterday w/ successful PCI to the LAD lesion.  Acute hypoxemic respiratory failure - dramatic perhilar infiltrates/ edema on 1/04 CXR, some of that may have been PNA. Have been using higher dextrose concentrations to pull more fluid. Clinically no LE edema. No hypoxia or SOB. Lower edw in OP setting as BP tolerates.  Right sided community acquired PNA-  was on vent for a while. Completed antibiotics  ESRD - on PD at home. Cont PD nightly.  Hypokalemia - K+ 5.1 today, dc'd po [facility] Anemia of CKD -  he is ordered aranesp 200 mcg weekly on Mondays and got the last dose.  Hb improved and is > 11 now. Lowered esa to 100 ug weekly on Monday.  Secondary hyperparathyroidism-  hyperphosphatemia however he states he has never been on a binder and that phos is usually controlled.  Have started  renvela for now for phos 4-6 range.  HTN -  controlled Dispo - for DC home today   Kelly Splinter, MD 03/15/2022, 12:16 PM  Recent Labs  Lab 03/13/22 0719 03/14/22 0136 03/15/22 0144  HGB 11.7* 12.2* 10.4*  ALBUMIN 2.8* 3.2*  --   CALCIUM 9.3 9.6 8.4*  PHOS 5.7* 5.1*  --   CREATININE 7.16* 7.53* 8.36*  K 5.1 4.5 4.9     Inpatient medications:  sodium chloride   Intravenous Once   aspirin  81 mg Oral Daily   atorvastatin  80 mg Oral Daily   carvedilol  3.125 mg Oral BID WC   Chlorhexidine Gluconate Cloth  6 each Topical Daily   clopidogrel  75 mg Oral Daily   [START ON 03/19/2022] darbepoetin (ARANESP) injection - NON-DIALYSIS  100 mcg Subcutaneous Q Mon-1800   enoxaparin (LOVENOX) injection  30 mg Subcutaneous Q24H   ezetimibe  10 mg Oral Daily   gentamicin cream   Topical Daily   guaiFENesin  600 mg Oral BID   melatonin  3 mg Oral QHS   pantoprazole  40 mg Oral Daily   QUEtiapine  25 mg Oral QHS   sevelamer carbonate  800 mg Oral TID WC   sodium chloride flush  3 mL Intravenous Q12H   sodium chloride flush  3 mL Intravenous Q12H   sodium chloride flush  3 mL Intravenous Q12H    sodium chloride 10 mL/hr at 03/12/22 1057   sodium chloride     sodium chloride     sodium chloride, sodium chloride, sodium chloride, acetaminophen, calcium carbonate, ipratropium-albuterol, metoprolol tartrate, ondansetron (ZOFRAN) IV, mouth rinse, [COMPLETED] polyethylene glycol **FOLLOWED BY** polyethylene glycol, prochlorperazine, sodium chloride flush, sodium chloride flush, white petrolatum

## 2022-03-15 NOTE — Progress Notes (Signed)
Mobility Specialist - Progress Note   03/15/22 1145  Mobility  Activity Ambulated with assistance to bathroom  Level of Assistance Minimal assist, patient does 75% or more  Assistive Device Front wheel walker  Distance Ambulated (ft) 20 ft  Activity Response Tolerated well  Mobility Referral Yes  $Mobility charge 1 Mobility   Pt received in chair needing assistance to BR. No complaints throughout. Pt was MinA throughout session. Pt was returned to chair with all needs met.   Franki Monte  Mobility Specialist Please contact via Solicitor or Rehab office at [phone]

## 2022-03-15 NOTE — TOC Transition Note (Signed)
Transition of Care [facility]) - CM/SW Discharge Note   Patient Details  Name: William [name] MRN: [id number] Date of Birth: October 04, 1940  [facility] of [facility] CM/SW Contact:  Bethena Roys, RN Phone Number: 03/15/2022, 12:09 PM   Clinical Narrative: Patient is declining home health services at this time. Case Manager did call daughter [name], and she states it may be better for the patient to return home without services then she will follow up with the patients PCP for services in the future. Patient lives in the home with daughter [name] that provides 24/7 supervision. Son will assist with transportation home this evening. No further needs identified at this time.   Final next level of care: Home/Self Care Barriers to Discharge: Continued Medical Work up   Patient Goals and CMS Choice CMS Medicare.gov Compare Post Acute Care list provided to:: Patient Choice offered to / list presented to : Patient, Adult Children [name]- Daughter)  Discharge Plan and Services Additional resources added to the After Visit Summary for     Discharge Planning Services: CM Consult Post Acute Care Choice: NA           HH Arranged: NA [place] Agency: NA Social Determinants of Health (SDOH) Interventions SDOH Screenings   Food Insecurity: No Food Insecurity (03/14/2022)  Housing: Low Risk  (03/14/2022)  Transportation Needs: No Transportation Needs (03/14/2022)  Utilities: Not At Risk (03/14/2022)  Tobacco Use: Medium Risk (03/15/2022)     Readmission Risk Interventions     No data to display

## 2022-03-15 NOTE — Progress Notes (Signed)
  Call received from [facility] to notify that sheath removal deferred due to elevated ACT and that a retest  will be performed after 2 hours pt resting. Family informed

## 2022-03-15 NOTE — Progress Notes (Signed)
Progress Note  Patient Name: William [name] Date of Encounter: 03/15/2022  Primary Cardiologist: Janina Mayo, MD  Subjective   He feels well this AM.   Inpatient Medications    Scheduled Meds:  sodium chloride   Intravenous Once   aspirin  81 mg Oral Daily   atorvastatin  80 mg Oral Daily   carvedilol  3.125 mg Oral BID WC   Chlorhexidine Gluconate Cloth  6 each Topical Daily   clopidogrel  75 mg Oral Daily   [START ON 03/19/2022] darbepoetin (ARANESP) injection - NON-DIALYSIS  100 mcg Subcutaneous Q Mon-1800   enoxaparin (LOVENOX) injection  30 mg Subcutaneous Q24H   ezetimibe  10 mg Oral Daily   gentamicin cream   Topical Daily   guaiFENesin  600 mg Oral BID   melatonin  3 mg Oral QHS   pantoprazole  40 mg Oral Daily   QUEtiapine  25 mg Oral QHS   sevelamer carbonate  800 mg Oral TID WC   sodium chloride flush  3 mL Intravenous Q12H   sodium chloride flush  3 mL Intravenous Q12H   sodium chloride flush  3 mL Intravenous Q12H   Continuous Infusions:  sodium chloride 10 mL/hr at 03/12/22 1057   sodium chloride     sodium chloride     dialysis solution 1.5% low-MG/low-CA     dialysis solution 2.5% low-MG/low-CA     PRN Meds: sodium chloride, sodium chloride, sodium chloride, acetaminophen, calcium carbonate, ipratropium-albuterol, metoprolol tartrate, ondansetron (ZOFRAN) IV, mouth rinse, [COMPLETED] polyethylene glycol **FOLLOWED BY** polyethylene glycol, prochlorperazine, sodium chloride flush, sodium chloride flush, white petrolatum   Vital Signs    Vitals:   03/15/22 0256 03/15/22 0315 03/15/22 0345 03/15/22 0745  BP: 124/71 123/88 112/69 104/89  Pulse: 81 (!) 108 78   Resp: '20 18 18 20  '$ Temp: (!) 97.4 F (36.3 C) 97.6 F (36.4 C) 97.8 F (36.6 C) 98.6 F (37 C)  TempSrc: Axillary Axillary Axillary Axillary  SpO2: 96% 95% 96%   Weight:      Height:       No intake or output data in the 24 hours ending 03/15/22 0936  Filed Weights   03/09/22 0733  03/13/22 0416 03/13/22 1044  Weight: 83.6 kg 83.8 kg 76.2 kg    Telemetry    Sinus   ECG    Diffuse ST segment depressions except in V1 and aVR where there is ST elevation 1/7  Physical Exam   GEN: No acute distress.  A&Ox3 Neck: No JVD. Cardiac: RRR, no murmur, rub, or gallop.  VASC:  R femoral site clean and dry Respiratory: Nonlabored. Clear to auscultation bilaterally. GI: Soft, nontender, bowel sounds present. MS: No edema; No deformity. Neuro:  Nonfocal. Psych: Alert and oriented x 3. Normal affect.  Labs    Chemistry Recent Labs  Lab 03/08/22 1520 03/09/22 0731 03/12/22 0023 03/13/22 0719 03/14/22 0136 03/15/22 0144  NA 135   < > 138 142 141 137  K 3.7   < > 4.2 5.1 4.5 4.9  CL 96*   < > 101 102 98 101  CO2 21*   < > '24 26 25 '$ 20*  GLUCOSE 129*   < > 129* 181* 195* 102*  BUN 76*   < > 64* 63* 64* 74*  CREATININE 6.89*   < > 6.90* 7.16* 7.53* 8.36*  CALCIUM 8.3*   < > 8.4* 9.3 9.6 8.4*  PROT 6.1*  --   --   --   --   --  ALBUMIN 2.8*   < > 2.5* 2.8* 3.2*  --   AST 32  --   --   --   --   --   ALT 31  --   --   --   --   --   ALKPHOS 49  --   --   --   --   --   BILITOT 0.6  --   --   --   --   --   GFRNONAA 8*   < > 7* 7* 7* 6*  ANIONGAP 18*   < > 13 14 18* 16*   < > = values in this interval not displayed.     Hematology Recent Labs  Lab 03/13/22 0719 03/14/22 0136 03/15/22 0144  WBC 12.6* 13.7* 11.4*  RBC 3.52* 3.66* 3.11*  HGB 11.7* 12.2* 10.4*  HCT 36.3* 38.6* 32.9*  MCV 103.1* 105.5* 105.8*  MCH 33.2 33.3 33.4  MCHC 32.2 31.6 31.6  RDW 13.5 13.5 13.8  PLT 305 304 225    Cardiac Enzymes Recent Labs  Lab 03/08/22 0553 03/08/22 0756 03/08/22 0917 03/08/22 1520 03/08/22 1850  TROPONINIHS 2,478* 2,695* 2,560* 1,534* 1,415*    BNPNo results for input(s): "BNP", "PROBNP" in the last 168 hours.   DDimerNo results for input(s): "DDIMER" in the last 168 hours.   Radiology    CARDIAC CATHETERIZATION  Result Date: 03/14/2022    Mid LAD lesion is 85% stenosed.   Dist Cx lesion is 60% stenosed.   Prox Cx to Mid Cx lesion is 20% stenosed.   1st Diag lesion is 70% stenosed.   A drug-eluting stent was successfully placed.   Post intervention, there is a 0% residual stenosis. Successful percutaneous coronary intervention to an 85 to 90% mildly tortuous mid LAD stenosis rated with PTCA, Cutting Balloon, and ultimate stenting with a 3.0 x 18 mm Onyx Frontier DES stent postdilated to 3.25 mm with the stenosis being reduced to 0%. RECOMMENDATION: DAPT for minimum of 1 year.  Medical therapy for concomitant CAD.  The patient is on peritoneal dialysis.  Aggressive lipid-lowering therapy with target LDL less than 55.    Cardiac Studies  Echo from 03/04/2022 LVEF 40 to 89% Grade 1 diastolic dysfunction RV systolic function is mildly reduced Mild MR Mild AI   Assessment & Plan    Patient is 81 year old M known to have ESRD on PD, HTN, hypothyroidism, HLD, mild MR presented with acute hypoxic respiratory failure secondary to significant pneumonia and NSTEMI. He is stable  # NSTEMI, likely type II in the setting of pneumonia but type I cannot be ruled out completely -Patient's troponins initially were 800 - 900s but peaked to 2695 during this hospitalization. EKG showed diffuse ST segment depressions except in aVR and V1 that has ST elevation. Echo showed LVEF 40 to 45% with no RWMA. Patient denied having chest pain or SOB but did have SOB prior to admission possibly from pneumonia. He completed antibiotic course for pneumonia and his confusion resolved. Hemoglobin has been stable and no bleeding episodes ever in his life. Patient will benefit from invasive ischemia evaluation with LHC prior to discharge.Risks and benefits of cardiac catheterization have been discussed with the patient.  These include bleeding, infection, kidney damage, stroke, heart attack, death. The patient understands these risks and is willing to proceed. -Patient is  had LHC 1/8, showed mid LAD disease; however ACT would not reach a therapeutic level. Plan to retry 1/9; however he refused the procedure.  On 1/10 he was amenable.  He is now s/p mid LAD PTCA and PCI 3.0 x 18 mm Onyx Frontier DES stent . - DAPT for 1 year - femoral site is clean and dry -Continue aspirin 81 mg once daily -Continue atorvastatin 20 mg nightly, added zetia 10 mg for LDL goal. -Switch carvedilol to metoprolol 25 mg twice daily  # Brief paroxysms of possible atrial tachycardia/SVT -Switch carvedilol to metoprolol 25 mg twice daily -2-week event monitor upon discharge was planned prior -OSA evaluation outpatient  # Acute hypoxic respiratory failure secondary to pneumonia, extubated on 03/05/2022: Patient pleated antibiotic course for pneumonia. 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  Patient did not have any bleeding episodes in his life. Hemoglobin was 7.9 in the current admission (which could be secondary to sepsis and anemia of chronic disease) for which he received PRBC transfusion. Hemoglobin stable 12  ESRD: peritoneal dialysis  # HFrEF, LVEF 40 to 45%, rule out CAD: Switch carvedilol to metoprolol 25 mg twice daily. Euvolemic.UF per PD  # HLD: Not on therapy prior to admission.  LDL 122 in 01/2022.  Repeat lipid panel with LDL at 94.  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 Atorvastatin 20 mg nightly is initiated during this hospitalization.  He can be discharged from a cardiology standpoint.  Time Spent Directly with Patient:  I have spent a total of 35 minutes with the patient reviewing hospital notes, telemetry, EKGs, labs and examining the patient as well as establishing an assessment and plan that was discussed personally with the  patient.  > 50% of time was spent in direct patient care.   Signed, Janina Mayo, MD  03/15/2022, 9:36 AM

## 2022-03-15 NOTE — Care Management Important Message (Signed)
Important Message  Patient Details  Name: William [name] MRN: [id number] Date of Birth: [birth year]   Medicare Important Message Given:  Yes     Shelda Altes 03/15/2022, 11:02 AM

## 2022-03-15 NOTE — Progress Notes (Signed)
D/C order noted. [place] and spoke to PD RN in home therapy dept. Staff advised that pt will d/c today to home. Clinic staff will f/u with pt at home tomorrow.   Melven Sartorius Renal Navigator [phone]

## 2022-03-22 ENCOUNTER — Encounter (HOSPITAL_COMMUNITY): Payer: Self-pay

## 2022-03-22 ENCOUNTER — Emergency Department (HOSPITAL_BASED_OUTPATIENT_CLINIC_OR_DEPARTMENT_OTHER): Payer: PPO

## 2022-03-22 ENCOUNTER — Other Ambulatory Visit: Payer: Self-pay

## 2022-03-22 ENCOUNTER — Inpatient Hospital Stay (HOSPITAL_BASED_OUTPATIENT_CLINIC_OR_DEPARTMENT_OTHER)
Admission: EM | Admit: 2022-03-22 | Discharge: 2022-03-29 | DRG: 871 | Disposition: A | Discharge status: Hospice | Payer: PPO | Attending: Family Medicine | Admitting: Family Medicine

## 2022-03-22 ENCOUNTER — Encounter (HOSPITAL_BASED_OUTPATIENT_CLINIC_OR_DEPARTMENT_OTHER): Payer: Self-pay | Admitting: Emergency Medicine

## 2022-03-22 DIAGNOSIS — R1310 Dysphagia, unspecified: Secondary | ICD-10-CM | POA: Diagnosis present

## 2022-03-22 DIAGNOSIS — I255 Ischemic cardiomyopathy: Secondary | ICD-10-CM | POA: Diagnosis present

## 2022-03-22 DIAGNOSIS — I82452 Acute embolism and thrombosis of left peroneal vein: Secondary | ICD-10-CM | POA: Diagnosis present

## 2022-03-22 DIAGNOSIS — Z992 Dependence on renal dialysis: Secondary | ICD-10-CM

## 2022-03-22 DIAGNOSIS — I5021 Acute systolic (congestive) heart failure: Secondary | ICD-10-CM

## 2022-03-22 DIAGNOSIS — Z8701 Personal history of pneumonia (recurrent): Secondary | ICD-10-CM

## 2022-03-22 DIAGNOSIS — Z7982 Long term (current) use of aspirin: Secondary | ICD-10-CM

## 2022-03-22 DIAGNOSIS — D539 Nutritional anemia, unspecified: Secondary | ICD-10-CM | POA: Diagnosis present

## 2022-03-22 DIAGNOSIS — Z6821 Body mass index (BMI) 21.0-21.9, adult: Secondary | ICD-10-CM

## 2022-03-22 DIAGNOSIS — R531 Weakness: Secondary | ICD-10-CM | POA: Diagnosis not present

## 2022-03-22 DIAGNOSIS — I63432 Cerebral infarction due to embolism of left posterior cerebral artery: Secondary | ICD-10-CM

## 2022-03-22 DIAGNOSIS — Z7902 Long term (current) use of antithrombotics/antiplatelets: Secondary | ICD-10-CM

## 2022-03-22 DIAGNOSIS — J9601 Acute respiratory failure with hypoxia: Secondary | ICD-10-CM | POA: Diagnosis present

## 2022-03-22 DIAGNOSIS — D631 Anemia in chronic kidney disease: Secondary | ICD-10-CM | POA: Diagnosis present

## 2022-03-22 DIAGNOSIS — I4719 Other supraventricular tachycardia: Secondary | ICD-10-CM | POA: Diagnosis present

## 2022-03-22 DIAGNOSIS — Z955 Presence of coronary angioplasty implant and graft: Secondary | ICD-10-CM

## 2022-03-22 DIAGNOSIS — Z79899 Other long term (current) drug therapy: Secondary | ICD-10-CM

## 2022-03-22 DIAGNOSIS — I252 Old myocardial infarction: Secondary | ICD-10-CM

## 2022-03-22 DIAGNOSIS — H53461 Homonymous bilateral field defects, right side: Secondary | ICD-10-CM | POA: Diagnosis present

## 2022-03-22 DIAGNOSIS — I82432 Acute embolism and thrombosis of left popliteal vein: Secondary | ICD-10-CM | POA: Diagnosis present

## 2022-03-22 DIAGNOSIS — Z515 Encounter for palliative care: Secondary | ICD-10-CM

## 2022-03-22 DIAGNOSIS — E43 Unspecified severe protein-calorie malnutrition: Secondary | ICD-10-CM | POA: Insufficient documentation

## 2022-03-22 DIAGNOSIS — A419 Sepsis, unspecified organism: Principal | ICD-10-CM | POA: Insufficient documentation

## 2022-03-22 DIAGNOSIS — R29704 NIHSS score 4: Secondary | ICD-10-CM | POA: Diagnosis present

## 2022-03-22 DIAGNOSIS — R7982 Elevated C-reactive protein (CRP): Secondary | ICD-10-CM | POA: Diagnosis present

## 2022-03-22 DIAGNOSIS — R451 Restlessness and agitation: Secondary | ICD-10-CM

## 2022-03-22 DIAGNOSIS — I5023 Acute on chronic systolic (congestive) heart failure: Secondary | ICD-10-CM

## 2022-03-22 DIAGNOSIS — K219 Gastro-esophageal reflux disease without esophagitis: Secondary | ICD-10-CM | POA: Diagnosis present

## 2022-03-22 DIAGNOSIS — Z87891 Personal history of nicotine dependence: Secondary | ICD-10-CM

## 2022-03-22 DIAGNOSIS — R6520 Severe sepsis without septic shock: Secondary | ICD-10-CM | POA: Diagnosis present

## 2022-03-22 DIAGNOSIS — Z9049 Acquired absence of other specified parts of digestive tract: Secondary | ICD-10-CM

## 2022-03-22 DIAGNOSIS — G9349 Other encephalopathy: Secondary | ICD-10-CM | POA: Diagnosis present

## 2022-03-22 DIAGNOSIS — E785 Hyperlipidemia, unspecified: Secondary | ICD-10-CM | POA: Diagnosis present

## 2022-03-22 DIAGNOSIS — E8809 Other disorders of plasma-protein metabolism, not elsewhere classified: Secondary | ICD-10-CM | POA: Diagnosis present

## 2022-03-22 DIAGNOSIS — R69 Illness, unspecified: Secondary | ICD-10-CM

## 2022-03-22 DIAGNOSIS — G9341 Metabolic encephalopathy: Secondary | ICD-10-CM | POA: Insufficient documentation

## 2022-03-22 DIAGNOSIS — F05 Delirium due to known physiological condition: Secondary | ICD-10-CM | POA: Diagnosis not present

## 2022-03-22 DIAGNOSIS — I5043 Acute on chronic combined systolic (congestive) and diastolic (congestive) heart failure: Secondary | ICD-10-CM | POA: Diagnosis present

## 2022-03-22 DIAGNOSIS — N186 End stage renal disease: Secondary | ICD-10-CM

## 2022-03-22 DIAGNOSIS — Z66 Do not resuscitate: Secondary | ICD-10-CM | POA: Diagnosis present

## 2022-03-22 DIAGNOSIS — I82462 Acute embolism and thrombosis of left calf muscular vein: Secondary | ICD-10-CM

## 2022-03-22 DIAGNOSIS — J189 Pneumonia, unspecified organism: Secondary | ICD-10-CM | POA: Diagnosis present

## 2022-03-22 DIAGNOSIS — I459 Conduction disorder, unspecified: Secondary | ICD-10-CM | POA: Diagnosis present

## 2022-03-22 DIAGNOSIS — I132 Hypertensive heart and chronic kidney disease with heart failure and with stage 5 chronic kidney disease, or end stage renal disease: Secondary | ICD-10-CM | POA: Diagnosis present

## 2022-03-22 DIAGNOSIS — I214 Non-ST elevation (NSTEMI) myocardial infarction: Secondary | ICD-10-CM | POA: Diagnosis present

## 2022-03-22 DIAGNOSIS — E039 Hypothyroidism, unspecified: Secondary | ICD-10-CM | POA: Diagnosis present

## 2022-03-22 DIAGNOSIS — R54 Age-related physical debility: Secondary | ICD-10-CM | POA: Diagnosis present

## 2022-03-22 DIAGNOSIS — Q2543 Congenital aneurysm of aorta: Secondary | ICD-10-CM

## 2022-03-22 DIAGNOSIS — I2510 Atherosclerotic heart disease of native coronary artery without angina pectoris: Secondary | ICD-10-CM | POA: Insufficient documentation

## 2022-03-22 DIAGNOSIS — N400 Enlarged prostate without lower urinary tract symptoms: Secondary | ICD-10-CM | POA: Diagnosis present

## 2022-03-22 DIAGNOSIS — R627 Adult failure to thrive: Secondary | ICD-10-CM | POA: Insufficient documentation

## 2022-03-22 DIAGNOSIS — Z888 Allergy status to other drugs, medicaments and biological substances status: Secondary | ICD-10-CM

## 2022-03-22 DIAGNOSIS — Z886 Allergy status to analgesic agent status: Secondary | ICD-10-CM

## 2022-03-22 DIAGNOSIS — E872 Acidosis, unspecified: Secondary | ICD-10-CM | POA: Diagnosis present

## 2022-03-22 DIAGNOSIS — E8889 Other specified metabolic disorders: Secondary | ICD-10-CM | POA: Diagnosis present

## 2022-03-22 HISTORY — DX: Atherosclerotic heart disease of native coronary artery without angina pectoris: I25.10

## 2022-03-22 LAB — I-STAT VENOUS BLOOD GAS, ED
Acid-Base Excess: 1 mmol/L (ref 0.0–2.0)
Bicarbonate: 26.6 mmol/L (ref 20.0–28.0)
Calcium, Ion: 0.92 mmol/L — ABNORMAL LOW (ref 1.15–1.40)
HCT: 32 % — ABNORMAL LOW (ref 39.0–52.0)
Hemoglobin: 10.9 g/dL — ABNORMAL LOW (ref 13.0–17.0)
O2 Saturation: 22 %
Potassium: 4.7 mmol/L (ref 3.5–5.1)
Sodium: 132 mmol/L — ABNORMAL LOW (ref 135–145)
TCO2: 28 mmol/L (ref 22–32)
pCO2, Ven: 43.4 mmHg — ABNORMAL LOW (ref 44–60)
pH, Ven: 7.395 (ref 7.25–7.43)
pO2, Ven: 16 mmHg — CL (ref 32–45)

## 2022-03-22 LAB — LACTIC ACID, PLASMA
Lactic Acid, Venous: 2.1 mmol/L (ref 0.5–1.9)
Lactic Acid, Venous: 1.5 mmol/L (ref 0.5–1.9)

## 2022-03-22 LAB — CBC WITH DIFFERENTIAL/PLATELET
Abs Immature Granulocytes: 0.06 10*3/uL (ref 0.00–0.07)
Basophils Absolute: 0.1 10*3/uL (ref 0.0–0.1)
Basophils Relative: 1 %
Eosinophils Absolute: 0 10*3/uL (ref 0.0–0.5)
Eosinophils Relative: 0 %
HCT: 30.7 % — ABNORMAL LOW (ref 39.0–52.0)
Hemoglobin: 9.8 g/dL — ABNORMAL LOW (ref 13.0–17.0)
Immature Granulocytes: 1 %
Lymphocytes Relative: 3 %
Lymphs Abs: 0.4 10*3/uL — ABNORMAL LOW (ref 0.7–4.0)
MCH: 33.6 pg (ref 26.0–34.0)
MCHC: 31.9 g/dL (ref 30.0–36.0)
MCV: 105.1 fL — ABNORMAL HIGH (ref 80.0–100.0)
Monocytes Absolute: 1 10*3/uL (ref 0.1–1.0)
Monocytes Relative: 8 %
Neutro Abs: 10.8 10*3/uL — ABNORMAL HIGH (ref 1.7–7.7)
Neutrophils Relative %: 87 %
Platelets: 199 10*3/uL (ref 150–400)
RBC: 2.92 MIL/uL — ABNORMAL LOW (ref 4.22–5.81)
RDW: 15 % (ref 11.5–15.5)
WBC: 12.4 10*3/uL — ABNORMAL HIGH (ref 4.0–10.5)
nRBC: 0 % (ref 0.0–0.2)

## 2022-03-22 LAB — PROTIME-INR
INR: 1.3 — ABNORMAL HIGH (ref 0.8–1.2)
Prothrombin Time: 15.7 seconds — ABNORMAL HIGH (ref 11.4–15.2)

## 2022-03-22 LAB — COMPREHENSIVE METABOLIC PANEL
ALT: 42 U/L (ref 0–44)
AST: 34 U/L (ref 15–41)
Albumin: 2.6 g/dL — ABNORMAL LOW (ref 3.5–5.0)
Alkaline Phosphatase: 70 U/L (ref 38–126)
Anion gap: 16 — ABNORMAL HIGH (ref 5–15)
BUN: 76 mg/dL — ABNORMAL HIGH (ref 8–23)
CO2: 25 mmol/L (ref 22–32)
Calcium: 7.3 mg/dL — ABNORMAL LOW (ref 8.9–10.3)
Chloride: 94 mmol/L — ABNORMAL LOW (ref 98–111)
Creatinine, Ser: 8.52 mg/dL — ABNORMAL HIGH (ref 0.61–1.24)
GFR, Estimated: 6 mL/min — ABNORMAL LOW (ref >60)
Glucose, Bld: 169 mg/dL — ABNORMAL HIGH (ref 70–99)
Potassium: 4.7 mmol/L (ref 3.5–5.1)
Sodium: 135 mmol/L (ref 135–145)
Total Bilirubin: 0.7 mg/dL (ref 0.3–1.2)
Total Protein: 6.3 g/dL — ABNORMAL LOW (ref 6.5–8.1)

## 2022-03-22 LAB — TROPONIN I (HIGH SENSITIVITY): Troponin I (High Sensitivity): 153 ng/L (ref <18)

## 2022-03-22 LAB — CBG MONITORING, ED: Glucose-Capillary: 150 mg/dL — ABNORMAL HIGH (ref 70–99)

## 2022-03-22 LAB — AMMONIA: Ammonia: 12 umol/L (ref 9–35)

## 2022-03-22 MED ORDER — VANCOMYCIN VARIABLE DOSE PER UNSTABLE RENAL FUNCTION (PHARMACIST DOSING)
Status: DC
  Filled 2022-03-22: qty 1

## 2022-03-22 MED ORDER — VANCOMYCIN HCL IN DEXTROSE 1-5 GM/200ML-% IV SOLN
1000.0000 mg | Freq: Once | INTRAVENOUS | Status: AC
  Administered 2022-03-22: 1000 mg via INTRAVENOUS
  Filled 2022-03-22: qty 200

## 2022-03-22 MED ORDER — LACTATED RINGERS IV SOLN: INTRAVENOUS | Status: DC

## 2022-03-22 MED ORDER — SODIUM CHLORIDE 0.9 % IV SOLN
1.0000 g | Freq: Once | INTRAVENOUS | Status: AC
  Administered 2022-03-22: 1 g via INTRAVENOUS

## 2022-03-22 MED ORDER — ONDANSETRON HCL 4 MG/2ML IJ SOLN
4.0000 mg | Freq: Once | INTRAMUSCULAR | Status: AC
  Administered 2022-03-22: 4 mg via INTRAVENOUS
  Filled 2022-03-22: qty 2

## 2022-03-22 MED ORDER — VANCOMYCIN HCL 500 MG IV SOLR
500.0000 mg | Freq: Once | INTRAVENOUS | Status: AC
  Administered 2022-03-22: 500 mg via INTRAVENOUS

## 2022-03-22 NOTE — Plan of Care (Signed)
Hospitalist transfer acceptance note:  [facility] to [facility]: Pt with recent hospital stay with PNA, on PD.  Complicated by NSTEMI and needed intubation last admit.  [place] home on 1/11.  Progressive weakness at home over past few days.  BPs soft in ED.  Looks like recurrent PNA again on CXR.  EDP put on cefepime and vanc.  Breathing not so great but EDP reports discussed with family and patient is now DNR (goal of care is still to get patient better; however, not going to be a repeat intubation this time around).   Put in for progressive [facility] at [facility] (dialysis patient).  Ill send a secure chat to nephrology on call as well to give them a heads up that PD patient is coming.  Especially given that volume overload is in the DDx (though id usually expect BP to be much higher with this).  TRH will assume care on arrival to accepting facility. Until arrival, care as per EDP. However, TRH available 24/7 for questions and assistance.  Nursing staff, please page [place] and Consults [phone]) as soon as the patient arrives to the hospital.

## 2022-03-22 NOTE — ED Notes (Signed)
Family at bedside. 

## 2022-03-22 NOTE — ED Notes (Signed)
Pt was removed from O2 by MD Pfeiffer to see how his saturation would do. Family came out and was worried that his O2 sat was intermittently dropping into the high 70's. Family concerned about O2; placed pt back on [place] 2L.

## 2022-03-22 NOTE — ED Notes (Signed)
Pt is awake and able to follow commands, albeit confused. Pt on a bedpan when changing over antbx. Much more coherent and alert than initial impression.

## 2022-03-22 NOTE — ED Notes (Signed)
Verbal order to continue LR maintenance until antbx finished.

## 2022-03-22 NOTE — ED Notes (Signed)
Patient family decided to take patient home and place on palliative care. Dr. Johnney Killian aware.

## 2022-03-22 NOTE — Progress Notes (Signed)
Pharmacy Antibiotic Note  William [name] is a 82 y.o. male admitted on 03/22/2022 presenting with lethargy, recent pna tx.  Pharmacy has been consulted for vancomycin dosing.  ESRD- PD  Plan: Vancomycin 1500 mg IV x 1, then variable dosing thereafter based on PD and levels Add MRSA PCR Monitor PD, PCR and clinical progression to narrow Vancomycin random level as needed  Height: 6' (182.9 cm) Weight: 83.9 kg (185 lb) IBW/kg (Calculated) : 77.6  Temp (24hrs), Avg:98.9 F (37.2 C), Min:97.7 F (36.5 C), Max:100 F (37.8 C)  Recent Labs  Lab 03/22/22 1557  WBC 12.4*  CREATININE 8.52*  LATICACIDVEN 2.1*    Estimated Creatinine Clearance: 7.6 mL/min (A) (by C-G formula based on SCr of 8.52 mg/dL (H)).    Allergies  Allergen Reactions   Nsaids Other (See Comments)    Chronic kidney disease   Valacyclovir Nausea And Vomiting   Lisinopril Rash    Bertis Ruddy, PharmD, [facility]  Clinical Pharmacist ED Pharmacist Phone # [phone] 03/22/2022 5:48 PM

## 2022-03-22 NOTE — ED Provider Notes (Signed)
[place] EMERGENCY DEPARTMENT Provider Note   CSN: [id number] Arrival date & time: 03/22/22  1550     History  Chief Complaint  Patient presents with   Weakness    William [name] is a 82 y.o. male.  HPI 82 year old man with hx of combined CHF, ESRD on PD, HTN, HLD and BPH recently discharged from the hospital 8 days ago with a complicated course of respiratory failure requiring intubation. Followed by NSTEMI. Patient had left heart cath showing severe diffuse coronary artery disease.  Patient had multifocal pneumonia.  After discharge, the patient's family reports that he was still weak but was able to get up and around for a couple of days.  After that he became too weak to get up again and has progressively gotten worse again.  He has had periods now significant confusion and lethargy.  Very poor oral intake.  They have continued to do the patient's peritoneal dialysis.  Patient is baseline before becoming ill in December was active and doing out Herbalist projects.  No prior history of hospitalizations.    Home Medications Prior to Admission medications   Medication Sig Start Date End Date Taking? Authorizing Provider  acetaminophen (TYLENOL) 325 MG tablet Take 2 tablets (650 mg total) by mouth every 6 (six) hours as needed for headache or mild pain. 03/15/22   Mercy Riding, MD  aspirin 81 MG chewable tablet Chew 1 tablet (81 mg total) by mouth daily. 03/16/22 03/16/23  Mercy Riding, MD  atorvastatin (LIPITOR) 20 MG tablet Take 1 tablet (20 mg total) by mouth daily. 03/16/22 06/14/22  Mercy Riding, MD  clopidogrel (PLAVIX) 75 MG tablet Take 1 tablet (75 mg total) by mouth daily. 03/16/22 03/16/23  Mercy Riding, MD  ezetimibe (ZETIA) 10 MG tablet Take 1 tablet (10 mg total) by mouth daily. 03/16/22 06/14/22  Mercy Riding, MD  melatonin 3 MG TABS tablet Take 1 tablet (3 mg total) by mouth at bedtime. 03/15/22   Mercy Riding, MD  metoprolol tartrate (LOPRESSOR) 25  MG tablet Take 1 tablet (25 mg total) by mouth 2 (two) times daily. 03/15/22 09/11/22  Mercy Riding, MD  ondansetron (ZOFRAN) 4 MG tablet Take 1 tablet (4 mg total) by mouth daily as needed for up to 365 doses for nausea or vomiting. 03/15/22   Mercy Riding, MD  pantoprazole (PROTONIX) 40 MG tablet Take 1 tablet (40 mg total) by mouth daily. 03/16/22 04/15/22  Mercy Riding, MD  sevelamer carbonate (RENVELA) 800 MG tablet Take 1 tablet (800 mg total) by mouth 3 (three) times daily with meals. 03/15/22 06/13/22  Mercy Riding, MD      Allergies    Nsaids, Valacyclovir, and Lisinopril    Review of Systems   Review of Systems  Physical Exam Updated Vital Signs BP 112/64   Pulse 66   Temp 98.8 F (37.1 C)   Resp (!) 25   Ht 6' (1.829 m)   Wt 83.9 kg   SpO2 95%   BMI 25.09 kg/m  Physical Exam Constitutional:      Comments: Patient is very ill in appearance.  He has increased labored breathing with appearance of Cheyne-Stokes pattern  HENT:     Head: Normocephalic and atraumatic.     Mouth/Throat:     Pharynx: Oropharynx is clear.     Comments: Lips are dry tongue is moist. Eyes:     Extraocular Movements: Extraocular movements intact.  Cardiovascular:     Comments: Rate is regular.  Heart sounds are distant. Pulmonary:     Comments: Patient has irregular breathing pattern appears to be Cheyne-Stokes.  Breath sounds sounds are very soft, occasional rhonchi. Abdominal:     General: There is no distension.     Palpations: Abdomen is soft.     Tenderness: There is no abdominal tenderness. There is no guarding.  Musculoskeletal:     Comments: Very weak generally.  No large amount of peripheral edema of the lower legs.  Calves are symmetric.  Skin:    General: Skin is warm and dry.     Coloration: Skin is pale.  Neurological:     Comments: Patient is lethargic.  He does not rouse to produce a few words at a time.     ED Results / Procedures / Treatments   Labs (all labs ordered  are listed, but only abnormal results are displayed) Labs Reviewed  COMPREHENSIVE METABOLIC PANEL - Abnormal; Notable for the following components:      Result Value   Chloride 94 (*)    Glucose, Bld 169 (*)    BUN 76 (*)    Creatinine, Ser 8.52 (*)    Calcium 7.3 (*)    Total Protein 6.3 (*)    Albumin 2.6 (*)    GFR, Estimated 6 (*)    Anion gap 16 (*)    All other components within normal limits  LACTIC ACID, PLASMA - Abnormal; Notable for the following components:   Lactic Acid, Venous 2.1 (*)    All other components within normal limits  CBC WITH DIFFERENTIAL/PLATELET - Abnormal; Notable for the following components:   WBC 12.4 (*)    RBC 2.92 (*)    Hemoglobin 9.8 (*)    HCT 30.7 (*)    MCV 105.1 (*)    Neutro Abs 10.8 (*)    Lymphs Abs 0.4 (*)    All other components within normal limits  PROTIME-INR - Abnormal; Notable for the following components:   Prothrombin Time 15.7 (*)    INR 1.3 (*)    All other components within normal limits  CBG MONITORING, ED - Abnormal; Notable for the following components:   Glucose-Capillary 150 (*)    All other components within normal limits  I-STAT VENOUS BLOOD GAS, ED - Abnormal; Notable for the following components:   pCO2, Ven 43.4 (*)    pO2, Ven 16 (*)    Sodium 132 (*)    Calcium, Ion 0.92 (*)    HCT 32.0 (*)    Hemoglobin 10.9 (*)    All other components within normal limits  TROPONIN I (HIGH SENSITIVITY) - Abnormal; Notable for the following components:   Troponin I (High Sensitivity) 153 (*)    All other components within normal limits  CULTURE, BLOOD (ROUTINE X 2)  CULTURE, BLOOD (ROUTINE X 2)  RESP PANEL BY RT-PCR (RSV, FLU A&B, COVID)  RVPGX2  MRSA NEXT GEN BY PCR, NASAL  LACTIC ACID, PLASMA  AMMONIA  URINALYSIS, ROUTINE W REFLEX MICROSCOPIC  TROPONIN I (HIGH SENSITIVITY)  TROPONIN I (HIGH SENSITIVITY)    EKG EKG Interpretation  Date/Time:  Thursday March 22 2022 15:57:27 EST Ventricular Rate:  74 PR  Interval:  207 QRS Duration: 113 QT Interval:  425 QTC Calculation: 472 R Axis:   95 Text Interpretation: Sinus rhythm Consider left ventricular hypertrophy Abnrm T, probable ischemia, anterolateral lds agree, similar to previous tracing Confirmed by Charlesetta Shanks [phone]) on 03/22/2022  4:05:37 PM  Radiology DG Chest Portable 1 View  Result Date: 03/22/2022 CLINICAL DATA:  Lethargic.  Decreasing blood pressure EXAM: PORTABLE CHEST 1 VIEW COMPARISON:  03/15/2022 x-ray and older FINDINGS: Enlarged cardiopericardial silhouette with increasing edema and perihilar opacities. Small effusions, left-greater-than-right. No pneumothorax. Tortuous and ectatic aorta. Overlapping cardiac leads. IMPRESSION: Developing bilateral opacities with effusions, possible edema. Secondary infiltrate is possible. No pneumothorax. Enlarged cardiopericardial silhouette. Electronically Signed   By: Jill Side M.D.   On: 03/22/2022 16:20    Procedures Procedures   CRITICAL CARE Performed by: Charlesetta Shanks   Total critical care time: 60 minutes  Critical care time was exclusive of separately billable procedures and treating other patients.  Critical care was necessary to treat or prevent imminent or life-threatening deterioration.  Critical care was time spent personally by me on the following activities: development of treatment plan with patient and/or surrogate as well as nursing, discussions with consultants, evaluation of patient's response to treatment, examination of patient, obtaining history from patient or surrogate, ordering and performing treatments and interventions, ordering and review of laboratory studies, ordering and review of radiographic studies, pulse oximetry and re-evaluation of patient's condition.  Medications Ordered in ED Medications  vancomycin variable dose per unstable renal function (pharmacist dosing) (has no administration in time range)  ondansetron (ZOFRAN) injection 4 mg (4 mg  Intravenous Given 03/22/22 1748)  ceFEPIme (MAXIPIME) 1 g in sodium chloride 0.9 % 100 mL IVPB (0 g Intravenous Stopped 03/22/22 2300)  vancomycin (VANCOCIN) IVPB 1000 mg/200 mL premix (0 mg Intravenous Stopped 03/22/22 1918)    Followed by  vancomycin (VANCOCIN) 500 mg in sodium chloride 0.9 % 100 mL IVPB (0 mg Intravenous Stopped 03/22/22 2208)    ED Course/ Medical Decision Making/ A&P                             Medical Decision Making Amount and/or Complexity of Data Reviewed Labs: ordered. Radiology: ordered.  Risk Prescription drug management. Decision regarding hospitalization.   Presents with significant comorbid conditions.  He had recent hospitalization complicated by NSTEMI after intubation for bilateral pneumonia.  At baseline he is on peritoneal dialysis.  Patient has gotten more ill over the past several days with respiratory distress and altered mental status.  Differential diagnosis includes recurrence or worsening pneumonia\sepsis\respiratory failure hypoxia multifactorial.  Patient had recent NSTEMI at high risk for CHF.  Patient has soft blood pressures as well as potential volume overload given dialysis state.  At risk for cardiogenic shock.  Chest x-ray returned and reviewed by radiology increasing consolidation and possibly vascular congestion.  I have personally looked at reviewed these test.  There is significant patchy infiltrate and also areas of consolidation.  Will start antibiotic therapy for worsening pneumonia with cefepime and vancomycin.  Gentle fluid resuscitation with maintenance LR at 50 mL/h.  Labs do not show significant focal derangement.  Patient does not present severely ill in appearance with Cheyne-Stokes respiratory pattern and lethargy.  Patient is high risk for decompensating condition given recent severe comorbid conditions of NSTEMI plus baseline peritoneal dialysis and significant pneumonia requiring intubation.  I have had extensive  conversation with the patient's family members regarding goals of care.  They do not want the patient to suffer any respiratory distress or struggle.  However, they do not wish to the patient does not wish any repeat intubation.  They do want comfort with oxygen administration and trying to treat underlying  conditions to ameliorate health.  They might consider hemodialysis on a temporary basis as a means to improve for hospital discharge.  If the patient does not make improvement and continues to decline, they are strongly considering transition to hospice and home care main goal to keep the patient comfortable.        Final Clinical Impression(s) / ED Diagnoses Final diagnoses:  Generalized weakness  Pneumonia of both lungs due to infectious organism, unspecified part of lung  Severe comorbid illness  Peritoneal dialysis catheter in place [facility])    Rx / DC Orders ED Discharge Orders     None         Charlesetta Shanks, MD 03/23/22 0025

## 2022-03-22 NOTE — ED Triage Notes (Signed)
Pt recently discharged from [facility] for pneumonia.  Pt has been lethargic at home, not eating, decreased BP, not feeling well for several days.  Pt does home dialysis.

## 2022-03-23 ENCOUNTER — Telehealth (HOSPITAL_COMMUNITY): Payer: Self-pay

## 2022-03-23 ENCOUNTER — Inpatient Hospital Stay (HOSPITAL_COMMUNITY): Payer: PPO

## 2022-03-23 ENCOUNTER — Observation Stay (HOSPITAL_COMMUNITY): Payer: PPO

## 2022-03-23 ENCOUNTER — Encounter (HOSPITAL_COMMUNITY): Payer: PPO

## 2022-03-23 DIAGNOSIS — I5023 Acute on chronic systolic (congestive) heart failure: Secondary | ICD-10-CM | POA: Diagnosis not present

## 2022-03-23 DIAGNOSIS — D631 Anemia in chronic kidney disease: Secondary | ICD-10-CM | POA: Diagnosis present

## 2022-03-23 DIAGNOSIS — N186 End stage renal disease: Secondary | ICD-10-CM | POA: Diagnosis present

## 2022-03-23 DIAGNOSIS — I63432 Cerebral infarction due to embolism of left posterior cerebral artery: Secondary | ICD-10-CM | POA: Diagnosis present

## 2022-03-23 DIAGNOSIS — Z7189 Other specified counseling: Secondary | ICD-10-CM

## 2022-03-23 DIAGNOSIS — I2510 Atherosclerotic heart disease of native coronary artery without angina pectoris: Secondary | ICD-10-CM | POA: Diagnosis not present

## 2022-03-23 DIAGNOSIS — Z992 Dependence on renal dialysis: Secondary | ICD-10-CM | POA: Diagnosis not present

## 2022-03-23 DIAGNOSIS — I639 Cerebral infarction, unspecified: Secondary | ICD-10-CM | POA: Diagnosis not present

## 2022-03-23 DIAGNOSIS — E43 Unspecified severe protein-calorie malnutrition: Secondary | ICD-10-CM | POA: Diagnosis present

## 2022-03-23 DIAGNOSIS — J189 Pneumonia, unspecified organism: Secondary | ICD-10-CM | POA: Diagnosis present

## 2022-03-23 DIAGNOSIS — I5021 Acute systolic (congestive) heart failure: Secondary | ICD-10-CM | POA: Diagnosis not present

## 2022-03-23 DIAGNOSIS — I4719 Other supraventricular tachycardia: Secondary | ICD-10-CM | POA: Diagnosis present

## 2022-03-23 DIAGNOSIS — A419 Sepsis, unspecified organism: Secondary | ICD-10-CM | POA: Insufficient documentation

## 2022-03-23 DIAGNOSIS — E039 Hypothyroidism, unspecified: Secondary | ICD-10-CM | POA: Diagnosis present

## 2022-03-23 DIAGNOSIS — I132 Hypertensive heart and chronic kidney disease with heart failure and with stage 5 chronic kidney disease, or end stage renal disease: Secondary | ICD-10-CM | POA: Diagnosis present

## 2022-03-23 DIAGNOSIS — E8889 Other specified metabolic disorders: Secondary | ICD-10-CM | POA: Diagnosis present

## 2022-03-23 DIAGNOSIS — F05 Delirium due to known physiological condition: Secondary | ICD-10-CM | POA: Diagnosis not present

## 2022-03-23 DIAGNOSIS — I214 Non-ST elevation (NSTEMI) myocardial infarction: Secondary | ICD-10-CM | POA: Diagnosis present

## 2022-03-23 DIAGNOSIS — Z515 Encounter for palliative care: Secondary | ICD-10-CM | POA: Diagnosis not present

## 2022-03-23 DIAGNOSIS — R627 Adult failure to thrive: Secondary | ICD-10-CM | POA: Diagnosis not present

## 2022-03-23 DIAGNOSIS — Q2543 Congenital aneurysm of aorta: Secondary | ICD-10-CM | POA: Diagnosis not present

## 2022-03-23 DIAGNOSIS — J9601 Acute respiratory failure with hypoxia: Secondary | ICD-10-CM | POA: Diagnosis present

## 2022-03-23 DIAGNOSIS — I82432 Acute embolism and thrombosis of left popliteal vein: Secondary | ICD-10-CM | POA: Diagnosis present

## 2022-03-23 DIAGNOSIS — R531 Weakness: Secondary | ICD-10-CM | POA: Diagnosis present

## 2022-03-23 DIAGNOSIS — G9341 Metabolic encephalopathy: Secondary | ICD-10-CM | POA: Insufficient documentation

## 2022-03-23 DIAGNOSIS — H53461 Homonymous bilateral field defects, right side: Secondary | ICD-10-CM

## 2022-03-23 DIAGNOSIS — E872 Acidosis, unspecified: Secondary | ICD-10-CM | POA: Diagnosis present

## 2022-03-23 DIAGNOSIS — I82462 Acute embolism and thrombosis of left calf muscular vein: Secondary | ICD-10-CM | POA: Diagnosis present

## 2022-03-23 DIAGNOSIS — I5043 Acute on chronic combined systolic (congestive) and diastolic (congestive) heart failure: Secondary | ICD-10-CM | POA: Diagnosis present

## 2022-03-23 DIAGNOSIS — G9349 Other encephalopathy: Secondary | ICD-10-CM | POA: Diagnosis present

## 2022-03-23 DIAGNOSIS — I82452 Acute embolism and thrombosis of left peroneal vein: Secondary | ICD-10-CM | POA: Diagnosis present

## 2022-03-23 DIAGNOSIS — Z66 Do not resuscitate: Secondary | ICD-10-CM | POA: Diagnosis present

## 2022-03-23 DIAGNOSIS — R451 Restlessness and agitation: Secondary | ICD-10-CM | POA: Diagnosis not present

## 2022-03-23 LAB — BASIC METABOLIC PANEL
Anion gap: 19 — ABNORMAL HIGH (ref 5–15)
BUN: 84 mg/dL — ABNORMAL HIGH (ref 8–23)
CO2: 22 mmol/L (ref 22–32)
Calcium: 7.3 mg/dL — ABNORMAL LOW (ref 8.9–10.3)
Chloride: 95 mmol/L — ABNORMAL LOW (ref 98–111)
Creatinine, Ser: 9.44 mg/dL — ABNORMAL HIGH (ref 0.61–1.24)
GFR, Estimated: 5 mL/min — ABNORMAL LOW (ref >60)
Glucose, Bld: 109 mg/dL — ABNORMAL HIGH (ref 70–99)
Potassium: 4.9 mmol/L (ref 3.5–5.1)
Sodium: 136 mmol/L (ref 135–145)

## 2022-03-23 LAB — LIPID PANEL
Cholesterol: 86 mg/dL (ref 0–200)
HDL: 37 mg/dL — ABNORMAL LOW (ref >40)
LDL Cholesterol: 32 mg/dL (ref 0–99)
Total CHOL/HDL Ratio: 2.3 RATIO
Triglycerides: 85 mg/dL (ref <150)
VLDL: 17 mg/dL (ref 0–40)

## 2022-03-23 LAB — CBC
HCT: 28.1 % — ABNORMAL LOW (ref 39.0–52.0)
Hemoglobin: 9 g/dL — ABNORMAL LOW (ref 13.0–17.0)
MCH: 33.5 pg (ref 26.0–34.0)
MCHC: 32 g/dL (ref 30.0–36.0)
MCV: 104.5 fL — ABNORMAL HIGH (ref 80.0–100.0)
Platelets: 189 10*3/uL (ref 150–400)
RBC: 2.69 MIL/uL — ABNORMAL LOW (ref 4.22–5.81)
RDW: 14.8 % (ref 11.5–15.5)
WBC: 10.6 10*3/uL — ABNORMAL HIGH (ref 4.0–10.5)
nRBC: 0 % (ref 0.0–0.2)

## 2022-03-23 LAB — FOLATE: Folate: 10.1 ng/mL (ref >5.9)

## 2022-03-23 LAB — VITAMIN B12: Vitamin B-12: 770 pg/mL (ref 180–914)

## 2022-03-23 LAB — MAGNESIUM: Magnesium: 2.1 mg/dL (ref 1.7–2.4)

## 2022-03-23 LAB — C-REACTIVE PROTEIN: CRP: 23.1 mg/dL — ABNORMAL HIGH (ref <1.0)

## 2022-03-23 LAB — MRSA NEXT GEN BY PCR, NASAL: MRSA by PCR Next Gen: NOT DETECTED

## 2022-03-23 LAB — PROCALCITONIN: Procalcitonin: 0.6 ng/mL

## 2022-03-23 LAB — GLUCOSE, CAPILLARY: Glucose-Capillary: 106 mg/dL — ABNORMAL HIGH (ref 70–99)

## 2022-03-23 LAB — BRAIN NATRIURETIC PEPTIDE: B Natriuretic Peptide: >4500 pg/mL — ABNORMAL HIGH (ref 0.0–100.0)

## 2022-03-23 LAB — TROPONIN I (HIGH SENSITIVITY): Troponin I (High Sensitivity): 143 ng/L (ref <18)

## 2022-03-23 LAB — TSH: TSH: 1.029 u[IU]/mL (ref 0.350–4.500)

## 2022-03-23 MED ORDER — DELFLEX-LC/2.5% DEXTROSE 394 MOSM/L IP SOLN: INTRAPERITONEAL | Status: DC

## 2022-03-23 MED ORDER — LORAZEPAM 2 MG/ML IJ SOLN: 1.0000 mg | Freq: Once | INTRAMUSCULAR | Status: DC | PRN

## 2022-03-23 MED ORDER — SODIUM CHLORIDE 0.9 % IV SOLN
1.0000 g | INTRAVENOUS | Status: DC
  Filled 2022-03-23: qty 10

## 2022-03-23 MED ORDER — METOPROLOL TARTRATE 25 MG PO TABS: 25.0000 mg | ORAL_TABLET | Freq: Two times a day (BID) | ORAL | Status: DC

## 2022-03-23 MED ORDER — ACETAMINOPHEN 650 MG RE SUPP: 650.0000 mg | Freq: Four times a day (QID) | RECTAL | Status: DC | PRN

## 2022-03-23 MED ORDER — MIDODRINE HCL 5 MG PO TABS
5.0000 mg | ORAL_TABLET | Freq: Three times a day (TID) | ORAL | Status: DC
  Administered 2022-03-23 – 2022-03-25 (×6): 5 mg via ORAL
  Filled 2022-03-23 (×8): qty 1

## 2022-03-23 MED ORDER — RENA-VITE PO TABS
1.0000 | ORAL_TABLET | Freq: Every day | ORAL | Status: DC
  Administered 2022-03-24 – 2022-03-25 (×2): 1 via ORAL
  Filled 2022-03-23 (×3): qty 1

## 2022-03-23 MED ORDER — FUROSEMIDE 10 MG/ML IJ SOLN
60.0000 mg | Freq: Once | INTRAMUSCULAR | Status: AC
  Administered 2022-03-23: 60 mg via INTRAVENOUS
  Filled 2022-03-23: qty 6

## 2022-03-23 MED ORDER — ACETAMINOPHEN 325 MG PO TABS: 650.0000 mg | ORAL_TABLET | Freq: Four times a day (QID) | ORAL | Status: DC | PRN

## 2022-03-23 MED ORDER — ATORVASTATIN CALCIUM 10 MG PO TABS
20.0000 mg | ORAL_TABLET | Freq: Every day | ORAL | Status: DC
  Administered 2022-03-23 – 2022-03-28 (×6): 20 mg via ORAL
  Filled 2022-03-23 (×6): qty 2

## 2022-03-23 MED ORDER — ASPIRIN 81 MG PO CHEW
81.0000 mg | CHEWABLE_TABLET | Freq: Every day | ORAL | Status: DC
  Administered 2022-03-23 – 2022-03-24 (×2): 81 mg via ORAL
  Filled 2022-03-23 (×2): qty 1

## 2022-03-23 MED ORDER — EZETIMIBE 10 MG PO TABS
10.0000 mg | ORAL_TABLET | Freq: Every day | ORAL | Status: DC
  Administered 2022-03-23 – 2022-03-28 (×6): 10 mg via ORAL
  Filled 2022-03-23 (×6): qty 1

## 2022-03-23 MED ORDER — SEVELAMER CARBONATE 800 MG PO TABS
800.0000 mg | ORAL_TABLET | Freq: Three times a day (TID) | ORAL | Status: DC
  Administered 2022-03-24 – 2022-03-26 (×7): 800 mg via ORAL
  Filled 2022-03-23 (×9): qty 1

## 2022-03-23 MED ORDER — METOPROLOL TARTRATE 25 MG PO TABS
25.0000 mg | ORAL_TABLET | Freq: Two times a day (BID) | ORAL | Status: DC
  Administered 2022-03-24: 25 mg via ORAL
  Filled 2022-03-23: qty 1

## 2022-03-23 MED ORDER — SODIUM CHLORIDE 0.9 % IV SOLN
3.0000 g | INTRAVENOUS | Status: DC
  Administered 2022-03-23 – 2022-03-26 (×4): 3 g via INTRAVENOUS
  Filled 2022-03-23 (×4): qty 8

## 2022-03-23 MED ORDER — ENSURE ENLIVE PO LIQD
237.0000 mL | Freq: Two times a day (BID) | ORAL | Status: DC
  Administered 2022-03-24 – 2022-03-28 (×6): 237 mL via ORAL

## 2022-03-23 MED ORDER — HEPARIN SODIUM (PORCINE) 5000 UNIT/ML IJ SOLN
5000.0000 [IU] | Freq: Three times a day (TID) | INTRAMUSCULAR | Status: DC
  Administered 2022-03-23 – 2022-03-24 (×4): 5000 [IU] via SUBCUTANEOUS
  Filled 2022-03-23 (×4): qty 1

## 2022-03-23 MED ORDER — PANTOPRAZOLE SODIUM 40 MG PO TBEC
40.0000 mg | DELAYED_RELEASE_TABLET | Freq: Every day | ORAL | Status: DC
  Administered 2022-03-23 – 2022-03-28 (×6): 40 mg via ORAL
  Filled 2022-03-23 (×6): qty 1

## 2022-03-23 MED ORDER — GENTAMICIN SULFATE 0.1 % EX CREA
1.0000 | TOPICAL_CREAM | Freq: Every day | CUTANEOUS | Status: DC
  Administered 2022-03-24: 1 via TOPICAL
  Filled 2022-03-23 (×2): qty 15

## 2022-03-23 MED ORDER — CLOPIDOGREL BISULFATE 75 MG PO TABS
75.0000 mg | ORAL_TABLET | Freq: Every day | ORAL | Status: DC
  Administered 2022-03-23 – 2022-03-28 (×6): 75 mg via ORAL
  Filled 2022-03-23 (×6): qty 1

## 2022-03-23 NOTE — Progress Notes (Signed)
Heart Failure Navigator Progress Note  Assessed for Heart & Vascular TOC clinic readiness.  Patient does not meet criteria due to ESRD on Peritoneal dialysis.   Navigator will sign off at this time.    Earnestine Leys, BSN, Clinical cytogeneticist Only

## 2022-03-23 NOTE — Telephone Encounter (Signed)
Phase II referral for Cardiac Rehab faxed to [facility]

## 2022-03-23 NOTE — Progress Notes (Addendum)
PROGRESS NOTE                                                                                                                                                                                                             Patient Demographics:    William [name], is a 82 y.o. male, DOB - 03-09-1940, WIO:[id number]  Outpatient Primary MD for the patient is [name], PA-C    LOS - 0  Admit date - 03/22/2022    Chief Complaint  Patient presents with   Weakness       Brief Narrative (HPI from H&P)    82 y.o. male with medical history significant of chronic HFrEF, ESRD on PD, hypertension, hyperlipidemia, BPH.  Recently admitted 12/29-1/11 for acute hypoxic respiratory failure secondary to multifocal pneumonia and was intubated 12/29-1/1.  Hospital course complicated by NSTEMI requiring DES to mid LAD on 1/10 and delirium which required Precedex drip.  Given overall poor prognosis, he was made DNR/DNI.  He presented to [facility] ED yesterday for generalized weakness, confusion, and very poor oral intake.  His workup was suggestive of acute hypoxic respiratory failure, possible uremic encephalopathy, CHF and pneumonia he was admitted to the hospital.   Subjective:    Dava Najjar today has, No headache, No chest pain, No abdominal pain - No Nausea, No new weakness tingling or numbness, mild SOB   Assessment  & Plan :   Metabolic encephalopathy.  Due to combination of acute hypoxic respiratory failure, CHF and pneumonia.  Supportive care.  He has had issues with delirium during his last hospital stay as well requiring Precedex drip, minimize benzodiazepines narcotics, as needed IM Haldol, nighttime Seroquel and monitor.  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 Will get MRI, carotid ultrasound. Pt already on DAPT due to recent stent placement. Recent echo shows slight decease in EF at 45 % with  G1DD. A1c normal on 03/14/22. Neurology consulted and will see patient.   ESRD on home PD.  Appears to have fluid overload.  Nephrology input, PD Agyeman may need to be titrated, challenge with Lasix on 03/23/2022 and monitor.  Questionable dysphagia and aspiration.  N.p.o. except medications speech eval.  Continue antibiotics for presumed recurrent pneumonia and follow cultures.  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  Recent history of left heart cath showing multivessel CAD requiring DES to mid LAD on 03/14/2022.  Appears to be in CHF again, for now continue DAPT blocker along with statin and Zetia, fluid removal via PD, dose of Lasix on 03/23/2022.  Cardiology input.  Dyslipidemia.  Continue statin and Zetia.  Hypertension.  Currently on beta-blocker.  Chronic macrocytic anemia.  B12, TSH stable.  PCP to monitor outpatient.  GERD - PPI       Condition - Extremely Guarded  Family Communication  :  daughter bedside  Code Status :  DNR  Consults  :  Renal, Cards  PUD Prophylaxis :    Procedures  :     TTE - 02/2022 -    1. Left ventricular ejection fraction, by estimation, is 40 to 45%. The left ventricle has mildly decreased function. The left ventricle demonstrates global hypokinesis. There is mild left ventricular hypertrophy. Left ventricular diastolic parameters are consistent with Grade I diastolic dysfunction (impaired relaxation).   2. Right ventricular systolic function is mildly reduced. The right ventricular size is normal. Tricuspid regurgitation signal is inadequate  for assessing PA pressure.   3. Moderate pleural effusion.   4. Mild mitral valve regurgitation.   5. Aortic valve regurgitation is mild.   6. The inferior vena cava is normal in size with <50% respiratory variability, suggesting right atrial pressure of 8 mmHg.       Disposition Plan  :    Status is:  Observation  DVT Prophylaxis  :    heparin injection 5,000 Units Start: 03/23/22 0915 SCDs Start: 03/23/22 0551    Lab Results  Component Value Date   PLT 189 03/23/2022    Diet :  Diet Order             Diet NPO time specified Except for: Sips with Meds  Diet effective now                    Inpatient Medications  Scheduled Meds:  aspirin  81 mg Oral Daily   atorvastatin  20 mg Oral Daily   clopidogrel  75 mg Oral Daily   ezetimibe  10 mg Oral Daily   furosemide  60 mg Intravenous Once   heparin injection (subcutaneous)  5,000 Units Subcutaneous Q8H   metoprolol tartrate  25 mg Oral BID   pantoprazole  40 mg Oral Daily   sevelamer carbonate  800 mg Oral TID WC   vancomycin variable dose per unstable renal function (pharmacist dosing)   Does not apply See admin instructions   Continuous Infusions:  ceFEPime (MAXIPIME) IV     PRN Meds:.acetaminophen **OR** acetaminophen    Objective:   Vitals:   03/23/22 0308 03/23/22 0400 03/23/22 0402 03/23/22 0500  BP: 118/75  110/70   Pulse: 71 74 72 65  Resp: (!) '26 14 20 20  '$ Temp: 98.1 F (36.7 C)  98 F (36.7 C)   TempSrc:   Oral   SpO2: 94% 95% 93% 97%  Weight:      Height:        Wt Readings from Last 3 Encounters:  03/22/22 83.9 kg  03/13/22 76.2 kg  02/25/19 83.9 kg     Intake/Output Summary (Last 24 hours) at 03/23/2022 0836 Last data filed at 03/22/2022 2300 Gross per 24 hour  Intake 616.93 ml  Output --  Net 616.93 ml     Physical Exam  Awake, mildly  confused, No new F.N deficits, Normal affect [place].AT,PERRAL Supple Neck, No JVD,   Symmetrical Chest wall movement, Good air movement bilaterally, coarse bilateral breath sounds RRR,No Gallops,Rubs or new Murmurs,  +ve B.Sounds, Abd Soft, No tenderness,   No Cyanosis, Clubbing or edema         Data Review:    Recent Labs  Lab 03/22/22 1557 03/22/22 1605 03/23/22 0650  WBC 12.4*  --  10.6*  HGB 9.8* 10.9* 9.0*  HCT 30.7* 32.0*  28.1*  PLT 199  --  189  MCV 105.1*  --  104.5*  MCH 33.6  --  33.5  MCHC 31.9  --  32.0  RDW 15.0  --  14.8  LYMPHSABS 0.4*  --   --   MONOABS 1.0  --   --   EOSABS 0.0  --   --   BASOSABS 0.1  --   --     Recent Labs  Lab 03/22/22 1557 03/22/22 1605 03/22/22 1657 03/22/22 1756 03/23/22 0650  NA 135 132*  --   --  136  K 4.7 4.7  --   --  4.9  CL 94*  --   --   --  95*  CO2 25  --   --   --  22  ANIONGAP 16*  --   --   --  19*  GLUCOSE 169*  --   --   --  109*  BUN 76*  --   --   --  84*  CREATININE 8.52*  --   --   --  9.44*  AST 34  --   --   --   --   ALT 42  --   --   --   --   ALKPHOS 70  --   --   --   --   BILITOT 0.7  --   --   --   --   ALBUMIN 2.6*  --   --   --   --   CRP  --   --   --   --  23.1*  LATICACIDVEN 2.1*  --   --  1.5  --   INR 1.3*  --   --   --   --   TSH  --   --   --   --  1.029  AMMONIA  --   --  12  --   --   BNP  --   --   --   --  >4,500.0*  MG  --   --   --   --  2.1  CALCIUM 7.3*  --   --   --  7.3*      Recent Labs  Lab 03/22/22 1557 03/22/22 1657 03/22/22 1756 03/23/22 0650  CRP  --   --   --  23.1*  LATICACIDVEN 2.1*  --  1.5  --   INR 1.3*  --   --   --   TSH  --   --   --  1.029  AMMONIA  --  12  --   --   BNP  --   --   --  >4,500.0*  MG  --   --   --  2.1  CALCIUM 7.3*  --   --  7.3*    Recent Labs  Lab 03/22/22 1557 03/22/22 1756 03/23/22 0650  WBC 12.4*  --  10.6*  PLT 199  --  189  CRP  --   --  23.1*  LATICACIDVEN 2.1* 1.5  --   CREATININE 8.52*  --  9.44*   Radiology Reports DG Chest Port 1 View  Result Date: 03/23/2022 CLINICAL DATA:  Shortness of breath.  Concern for pneumonia. EXAM: PORTABLE CHEST 1 VIEW COMPARISON:  03/22/2022 FINDINGS: Unchanged cardiac enlargement. Decreased left pleural effusion. Diffuse bilateral interstitial and airspace opacities are again noted. Compared with the previous exam there is been improved aeration to both lower lung zones. IMPRESSION: 1. Persistent bilateral  interstitial and airspace opacities with improved aeration to both lower lung zones. 2. Decrease in left pleural effusion. Electronically Signed   By: Kerby Moors M.D.   On: 03/23/2022 06:29   DG Chest Portable 1 View  Result Date: 03/22/2022 CLINICAL DATA:  Lethargic.  Decreasing blood pressure EXAM: PORTABLE CHEST 1 VIEW COMPARISON:  03/15/2022 x-ray and older FINDINGS: Enlarged cardiopericardial silhouette with increasing edema and perihilar opacities. Small effusions, left-greater-than-right. No pneumothorax. Tortuous and ectatic aorta. Overlapping cardiac leads. IMPRESSION: Developing bilateral opacities with effusions, possible edema. Secondary infiltrate is possible. No pneumothorax. Enlarged cardiopericardial silhouette. Electronically Signed   By: Jill Side M.D.   On: 03/22/2022 16:20      Signature  -   Lala Lund M.D on 03/23/2022 at 8:36 AM   -  To page go to [email or web address]    Addendum -  I have directly reviewed the clinical findings, lab results and imaging studies. I have interviewed and examined the patient and agree with the documentation and management as recorded by the Dr Humphrey Rolls, he was admitted here for CHF and pneumonia also found out to have a small acute stroke.  Neurology following along with cardiology.  Continue to monitor.  Lala Lund M.D on 03/24/2022 at 9:15 AM  [facility] Office  [phone]

## 2022-03-23 NOTE — ED Notes (Signed)
Pt placed on bedpan at this time.

## 2022-03-23 NOTE — Progress Notes (Signed)
Initial Nutrition Assessment  DOCUMENTATION CODES:   Not applicable  INTERVENTION:   Renal Multivitamin w/ minerals daily Ensure Enlive po BID, each supplement provides 350 kcal and 20 grams of protein. Encourage good PO intake   NUTRITION DIAGNOSIS:   Increased nutrient needs related to chronic illness as evidenced by estimated needs.  GOAL:   Patient will meet greater than or equal to 90% of their needs  MONITOR:   PO intake, Supplement acceptance, Labs, Weight trends  REASON FOR ASSESSMENT:   Malnutrition Screening Tool    ASSESSMENT:   82 y.o. male presented to the [facility] with generalized weakness, confusion, and poor PO intake. Pt recently admitted 12/29-1/11 for acute hypoxic respiratory failure 2/2 pneumonia and NSTEMI. PMH includes ESRD on PD, HTN, HLD, GERD, CHF, and CAD. Pt admitted with acute hypoxemic respiratory failure, volume overload, possible PNA, and metabolic encephalopathy.   Pt unavailable at RD attempt x3. Discussed with SLP, able to advance pt to regular diet.  Discussed with RN this morning, found to have CVA on the CT and requiring an MRI.  Due to report of poor PO intake PTA, RD to order nutritional supplements.  Medications reviewed and include: Lasix, Protonix, Renvela, IV antibiotics  Labs reviewed: Sodium 136, Potassium 4.9, Magnesium 2.1, Vitamin B12 770, Folate 10.1, CRP 23.1 (H), Hgb A1c 5.5%  NUTRITION - FOCUSED PHYSICAL EXAM:  Deferred to follow-up.   Diet Order:   Diet Order             Diet regular Room service appropriate? Yes; Fluid consistency: Thin  Diet effective now                   EDUCATION NEEDS:   No education needs have been identified at this time  Skin:  Skin Assessment: Reviewed RN Assessment  Last BM:  PTA  Height:   Ht Readings from Last 1 Encounters:  03/22/22 6' (1.829 m)    Weight:   Wt Readings from Last 1 Encounters:  03/22/22 83.9 kg    Ideal Body Weight:  80.9 kg  BMI:  Body mass  index is 25.09 kg/m.  Estimated Nutritional Needs:  Kcal:  2100-2300 Protein:  105-120 grams Fluid:  >/= 2 L   Hermina Barters RD, LDN Clinical Dietitian See [facility] for contact information.

## 2022-03-23 NOTE — Consult Note (Signed)
NEUROLOGY CONSULTATION NOTE   Date of service: March 23, 2022 Patient Name: William [name] MRN:  [id number] DOB:  Jun 16, 1940 Reason for consult: "subacute stroke" Requesting physician: Dr. Candiss Norse  History of Present Illness   Taino Maertens is a 82 y.o. male with PMHx significant for ESRD on PD, HTN, HLD, CAD with NSTEMI 12/31 s/p PTCA and DES 9/62/95, systolic and diastolic heart failure who was admitted for generalized weakness, confusion, and poor PO intake. Neurology was consulted due to subacute stroke found on CT head.   Patient is disoriented with delayed responses. He is able to state his name and with encouragement and time he is able to state the year and that we are in [place] but he is unable to specify what type of place we are in or what led him to this hospitalization. He has multiple family members at bedside and they report that he has been confused for the past week and generally weak and unsteady but compliant with all his medications. Family deny history of Afib. Long discussion with family at bedside regarding his imaging and findings.   Per chart review, patient was admitted to this hospital from 12/29-1/11 for acute hypoxic respiratory failure in the setting of multifocal PNA that required intubation. He developed delirium that required ICU transfer for precedex drip. He was also noted to have LHC that showed multivessel CAD and he had DES to mid LAD on 1/10. At time of discharge, he was recommended to continue DAPT with ASA and plavix for 1 year, metoprolol 25 BID, atorvastatin 20 daily, and zetia '10mg'$  daily. Family then brought him to [facility] ED on 1/18 for weakness, confusion, and poor PO intake. In the ED, he was noted to be hypotensive (systolic 28U), tachypneic (O2 sats 82%), and with leukocytosis with CXR concerning for possible edema vs. Secondary infiltrate.      ROS   Per HPI: all other systems reviewed and are negative  Past History   I have  reviewed the following:  Past Medical History:  Diagnosis Date   Aortic insufficiency    Coronary artery disease    ESRD on peritoneal dialysis ([place])    Hyperlipidemia    Hypertension    Past Surgical History:  Procedure Laterality Date   APPENDECTOMY     CARDIOVASCULAR STRESS TEST  06/08/2009   EF 43%   CHOLECYSTECTOMY     CORONARY STENT INTERVENTION N/A 03/14/2022   Procedure: CORONARY STENT INTERVENTION;  Surgeon: Troy Sine, MD;  Location: [facility];  Service: Cardiovascular;  Laterality: N/A;   FOOT SURGERY     HERNIA REPAIR     LEFT HEART CATH AND CORONARY ANGIOGRAPHY N/A 03/12/2022   Procedure: LEFT HEART CATH AND CORONARY ANGIOGRAPHY;  Surgeon: Nelva Bush, MD;  Location: [facility];  Service: Cardiovascular;  Laterality: N/A;   US ECHOCARDIOGRAPHY  05/24/2009   EF 60%   Family History  Problem Relation Age of Onset   Cancer Mother    Cirrhosis Father    Diabetes Brother    Social History   Socioeconomic History   Marital status: Married    Spouse name: Not on file   Number of children: 4   Years of education: Not on file   Highest education level: Not on file  Occupational History   Occupation: Clinical biochemist    Employer: RETIRED  Tobacco Use   Smoking status: Former    Types: Cigarettes    Quit date: 03/05/1966  Years since quitting: 56.0   Smokeless tobacco: Never  Substance and Sexual Activity   Alcohol use: No   Drug use: No   Sexual activity: Not on file  Other Topics Concern   Not on file  Social History Narrative   Not on file   Social Determinants of Health   Financial Resource Strain: Not on file  Food Insecurity: No Food Insecurity (03/14/2022)   Hunger Vital Sign    Worried About Running Out of Food in the Last Year: Never true    Ran Out of Food in the Last Year: Never true  Transportation Needs: No Transportation Needs (03/14/2022)   PRAPARE - Hydrologist (Medical): No    Lack  of Transportation (Non-Medical): No  Physical Activity: Not on file  Stress: Not on file  Social Connections: Not on file   Allergies  Allergen Reactions   Nsaids Other (See Comments)    Chronic kidney disease   Valacyclovir Nausea And Vomiting   Lisinopril Rash    Medications   Medications Prior to Admission  Medication Sig Dispense Refill Last Dose   acetaminophen (TYLENOL) 325 MG tablet Take 2 tablets (650 mg total) by mouth every 6 (six) hours as needed for headache or mild pain.   Unk   aspirin 81 MG chewable tablet Chew 1 tablet (81 mg total) by mouth daily. 90 tablet 3 03/22/2022   atorvastatin (LIPITOR) 20 MG tablet Take 1 tablet (20 mg total) by mouth daily. 90 tablet 0 03/22/2022   clopidogrel (PLAVIX) 75 MG tablet Take 1 tablet (75 mg total) by mouth daily. 90 tablet 3 03/22/2022   ezetimibe (ZETIA) 10 MG tablet Take 1 tablet (10 mg total) by mouth daily. 90 tablet 0 03/22/2022   melatonin 3 MG TABS tablet Take 1 tablet (3 mg total) by mouth at bedtime.  0 Past Week   metoprolol tartrate (LOPRESSOR) 25 MG tablet Take 1 tablet (25 mg total) by mouth 2 (two) times daily. 180 tablet 1 03/22/2022 at Morning   ondansetron (ZOFRAN) 4 MG tablet Take 1 tablet (4 mg total) by mouth daily as needed for up to 365 doses for nausea or vomiting. 20 tablet 0 Unk   pantoprazole (PROTONIX) 40 MG tablet Take 1 tablet (40 mg total) by mouth daily. 30 tablet 0 03/22/2022   sevelamer carbonate (RENVELA) 800 MG tablet Take 1 tablet (800 mg total) by mouth 3 (three) times daily with meals. 270 tablet 0 03/22/2022      Current Facility-Administered Medications:    acetaminophen (TYLENOL) tablet 650 mg, 650 mg, Oral, Q6H PRN **OR** acetaminophen (TYLENOL) suppository 650 mg, 650 mg, Rectal, Q6H PRN, Shela Leff, MD   Ampicillin-Sulbactam (UNASYN) 3 g in sodium chloride 0.9 % 100 mL IVPB, 3 g, Intravenous, Q24H, Thurnell Lose, MD, Last Rate: 200 mL/hr at 03/23/22 1509, 3 g at 03/23/22 1509    aspirin chewable tablet 81 mg, 81 mg, Oral, Daily, Lala Lund K, MD, 81 mg at 03/23/22 1050   atorvastatin (LIPITOR) tablet 20 mg, 20 mg, Oral, Daily, Lala Lund K, MD, 20 mg at 03/23/22 1050   clopidogrel (PLAVIX) tablet 75 mg, 75 mg, Oral, Daily, Thurnell Lose, MD, 75 mg at 03/23/22 1050   dialysis solution 2.5% low-MG/low-CA dianeal solution, , Intraperitoneal, Q24H, Madelon Lips, MD   ezetimibe (ZETIA) tablet 10 mg, 10 mg, Oral, Daily, Lala Lund K, MD, 10 mg at 03/23/22 1050   [START ON 03/24/2022] feeding supplement (ENSURE ENLIVE /  ENSURE PLUS) liquid 237 mL, 237 mL, Oral, BID BM, Candiss Norse, Prashant K, MD   gentamicin cream (GARAMYCIN) 0.1 % 1 Application, 1 Application, Topical, Daily, Madelon Lips, MD   heparin injection 5,000 Units, 5,000 Units, Subcutaneous, Q8H, Thurnell Lose, MD, 5,000 Units at 03/23/22 1510   [START ON 03/24/2022] metoprolol tartrate (LOPRESSOR) tablet 25 mg, 25 mg, Oral, BID, Thurnell Lose, MD   midodrine (PROAMATINE) tablet 5 mg, 5 mg, Oral, TID WC, Madelon Lips, MD, 5 mg at 03/23/22 1510   multivitamin (RENA-VIT) tablet 1 tablet, 1 tablet, Oral, QHS, Singh, Prashant K, MD   pantoprazole (PROTONIX) EC tablet 40 mg, 40 mg, Oral, Daily, Lala Lund K, MD, 40 mg at 03/23/22 1050   sevelamer carbonate (RENVELA) tablet 800 mg, 800 mg, Oral, TID WC, Thurnell Lose, MD  Vitals   Vitals:   03/23/22 0400 03/23/22 0402 03/23/22 0500 03/23/22 1319  BP:  110/70  106/68  Pulse: 74 72 65   Resp: '14 20 20   '$ Temp:  98 F (36.7 C)  98 F (36.7 C)  TempSrc:  Oral  Axillary  SpO2: 95% 93% 97%   Weight:      Height:         Body mass index is 25.09 kg/m.  Physical Exam   Physical Exam Gen: Alert to self and oriented to city. Not oriented to year, situation, time. HEENT: Atraumatic, normocephalic;mucous membranes moist; oropharynx clear, tongue without atrophy or fasciculations. Neck: Supple, trachea midline. Resp: Normal WOB  on RA.  CV: RRR. 2+ symmetric peripheral pulses. Abd: soft, non-tender. Extrem: Decreased bulk; no cyanosis, clubbing, or edema.  Neuro: *MS: Alert to self and oriented to city.  *Speech: fluid, nondysarthric, able to name and repeat *CN:    I: Deferred   II,III: PERRLA. Right sided homonymous hemianopsia.   III,IV,VI: EOMI w/o nystagmus, no ptosis.    V: Sensation intact from V1 to V3 to LT   VII: Eyelid closure was full.  Smile symmetric.   VIII: Hearing intact to voice   IX,X: Voice normal, palate elevates symmetrically    XI: SCM/trap 5/5 bilat   XII: Tongue protrudes midline, no atrophy or fasciculations   *Motor:   Normal bulk.  No tremor, rigidity or bradykinesia. No pronator drift.    Strength: Dlt Bic Tri FgS HF KnF KnE PlF DoF    Left '5 5 5 5 5 5 5 5 5    '$ Right '5 5 5 5 5 5 5 5 5    '$ *Sensory: Intact to light touch throughout. Symmetric. No double-simultaneous extinction.  *Coordination:  Finger-to-nose with slight dysmetria bilaterally. *Reflexes:  2+ and symmetric throughout without clonus; toes down-going bilat *Gait: Deferred.  NIHSS components Score: Comment  1a Level of Conscious 0'[x]'$  1'[]'$  2'[]'$  3'[]'$      1b LOC Questions 0'[]'$  1'[x]'$  2'[x]'$       1c LOC Commands 0'[x]'$  1'[]'$  2'[]'$       2 Best Gaze 0'[x]'$  1'[]'$  2'[]'$       3 Visual 0'[]'$  1'[x]'$  2'[]'$  3'[]'$      4 Facial Palsy 0'[x]'$  1'[]'$  2'[]'$  3'[]'$      5a Motor Arm - left 0'[x]'$  1'[]'$  2'[]'$  3'[]'$  4'[]'$  UN'[]'$    5b Motor Arm - Right 0'[x]'$  1'[]'$  2'[]'$  3'[]'$  4'[]'$  UN'[]'$    6a Motor Leg - Left 0'[x]'$  1'[]'$  2'[]'$  3'[]'$  4'[]'$  UN'[]'$    6b Motor Leg - Right 0'[x]'$  1'[]'$  2'[]'$  3'[]'$  4'[]'$  UN'[]'$    7 Limb Ataxia 0'[x]'$  1'[]'$  2'[]'$  3'[]'$  UN'[]'$   8 Sensory 0'[x]'$  1'[]'$  2'[]'$  UN'[]'$      9 Best Language 0'[x]'$  1'[]'$  2'[]'$  3'[]'$      10 Dysarthria 0'[x]'$  1'[]'$  2'[]'$  UN'[]'$      11 Extinct. and Inattention 0'[x]'$  1'[]'$  2'[]'$       TOTAL:  4       Labs   CBC:  Recent Labs  Lab 03/22/22 1557 03/22/22 1605 03/23/22 0650  WBC 12.4*  --  10.6*  NEUTROABS 10.8*  --   --   HGB 9.8* 10.9* 9.0*  HCT 30.7* 32.0* 28.1*  MCV 105.1*  --   104.5*  PLT 199  --  323    Basic Metabolic Panel:  Lab Results  Component Value Date   NA 136 03/23/2022   K 4.9 03/23/2022   CO2 22 03/23/2022   GLUCOSE 109 (H) 03/23/2022   BUN 84 (H) 03/23/2022   CREATININE 9.44 (H) 03/23/2022   CALCIUM 7.3 (L) 03/23/2022   GFRNONAA 5 (L) 03/23/2022   GFRAA 27 (L) 12/30/2014   Lipid Panel:  Lab Results  Component Value Date   LDLCALC 32 03/23/2022   HgbA1c:  Lab Results  Component Value Date   HGBA1C 5.5 03/14/2022   Urine Drug Screen: No results found for: "LABOPIA", "COCAINSCRNUR", "LABBENZ", "AMPHETMU", "THCU", "LABBARB"  Alcohol Level No results found for: "ETH"  12/31 echo 1. Left ventricular ejection fraction, by estimation, is 40 to 45%. The  left ventricle has mildly decreased function. The left ventricle  demonstrates global hypokinesis. There is mild left ventricular  hypertrophy. Left ventricular diastolic parameters  are consistent with Grade I diastolic dysfunction (impaired relaxation).   2. Right ventricular systolic function is mildly reduced. The right  ventricular size is normal. Tricuspid regurgitation signal is inadequate  for assessing PA pressure.   3. Moderate pleural effusion.   4. Mild mitral valve regurgitation.   5. Aortic valve regurgitation is mild.   6. The inferior vena cava is normal in size with <50% respiratory  variability, suggesting right atrial pressure of 8 mmHg.   1/19 CT Head without contrast: IMPRESSION: Suspected subacute infarct in the left occipital lobe with possible petechial hemorrhage but no mass effect. Recommend brain MRI for better evaluation.  1/19 MRI Brain  IMPRESSION: Only diffusion images were acquired due to the patient's inability to tolerate the exam. 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 No other areas of restricted diffusion.  MRA head/neck pending  Impression   Mr. Cozart is a 82 year old male with a PMHx of ESRD on PD, HTN, hypothyroidism, HLD, CAD  with NSTEMI s/p DES 5/57, systolic and diastolic heart failure, recent admission for acute hypoxic respiratory failure who was admitted for generalized weakness, confusion, and poor PO intake. Neurology was consulted due to subacute stroke found on CT head. 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 However, it is unlikely that this stroke is contributing to his ongoing confusion which is felt to be 2/2 his pneumonia. He was not able to tolerate MRA today and has not had vascular imaging.   Recommendations   - continue ASA and plavix DAPT  - Patient was not able to tolerate MRA H&N today 2/2 confusion. May reattempt tomorrow as his encephalopathy clears or alternatively ask nephrology if he is able to undergo CTA while he is on PD - No indication for permissive HTN given stroke appears to be >48 hrs on MRI - continue zetia 10 and  lipitor 40  - defer to primary team regarding treatment of his AHRF - delirium precautions  - STAT head CT for any change in neuro exam - Tele - PT/OT/SLP - Stroke education - Amb referral to neurology upon discharge   ______________________________________________________________________   Rolanda Lundborg, MD, PGY-1   Attending Neurohospitalist Addendum Patient seen and examined with APP/Resident. Agree with the history and physical as documented above. Agree with the plan as documented, which I helped formulate. I have edited the note above to reflect my full findings and recommendations. I have independently reviewed the chart, obtained history, review of systems and examined the patient.I have personally reviewed pertinent head/neck/spine imaging (CT/MRI). Please feel free to call with any questions.  CT head shows some petechial hemorrhage without frank hematoma. Continue DAPT given recent DES placement. Patient was not able to tolerate MRA H&N today 2/2 confusion.  May reattempt tomorrow as his encephalopathy clears or alternatively ask nephrology if he is able to undergo CTA while he is on PD. If vascular imaging is concerning for embolic event (LVO in absence of other significant cerebrovascular disease) consider TEE vs ambulatory cardiac monitoring. Further guidance to be provided by stroke team who will assume care in AM.   -- Su Monks, MD [facility] [phone]  If 7pm- 7am, please page neurology on call as listed in [place].

## 2022-03-23 NOTE — Progress Notes (Signed)
PT Cancellation Note  Patient Details Name: William [name] MRN: [id number] DOB: 1940/04/30   Cancelled Treatment:    Reason Eval/Treat Not Completed: Patient at procedure or test/unavailable  Currently off unit for MRI. Will follow up for PT evaluation when schedule allows. Ellouise Newer 03/23/2022, 12:23 PM

## 2022-03-23 NOTE — ED Notes (Signed)
Report to Sarah, RN

## 2022-03-23 NOTE — Progress Notes (Addendum)
Pt and pt family members requesting for pt ambulate to restroom with Chin,MD of neurology at bedside. Bedpan offered due to pt not having PT/OT eval. RN educated family of the risks of falls. Granddaughter assisting pt to bedside commode.

## 2022-03-23 NOTE — Care Management (Signed)
CM attempted to meet with Patient for Initial Assessment. Patient had several visitors in room . TOC will need to re-attempt IA. Choice List for recommended Home Health is printed out and  on CM desk.  TOC will continue to follow patient for any additional discharge needs

## 2022-03-23 NOTE — ED Notes (Signed)
Report to [facility] 

## 2022-03-23 NOTE — Consult Note (Addendum)
Consultation Note Date: 03/23/2022   Patient Name: William [name]  DOB: [birth year]  MRN: [id number]  Age / Sex: 82 y.o., male  PCP: [name], PA-C Referring Physician: Thurnell Lose, MD  Reason for Consultation: Establishing goals of care  HPI/Patient Profile: 82 y.o. male  with past medical history of chronic HFrEF, ESRD on PD, hypertension, hyperlipidemia, and BPH admitted on 03/22/2022 with weakness, confusion, and poor p.o. intake. Recently admitted 12/29-1/11 for acute hypoxic respiratory failure secondary to multifocal pneumonia and was intubated 12/29-1/1.  Hospital course complicated by NSTEMI requiring DES to mid LAD on 1/10 and delirium which required Precedex drip.  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  MRI pending.  PMT consulted to discuss goals of care.  Clinical Assessment and Goals of Care: I have reviewed medical records including EPIC notes, labs and imaging, assessed the patient and then spoke with patient's wife Vaughan Basta to discuss diagnosis prognosis, [place], EOL wishes, disposition and options.  I introduced Palliative Medicine as specialized medical care for people living with serious illness. It focuses on providing relief from the symptoms and stress of a serious illness. The goal is to improve quality of life for both the patient and the family.  Vaughan Basta shares prior to patient's hospitalization back in December he was doing very well.  She tells me at his baseline he is fully functional.  She tells me since the hospitalization that began December 29 patient has had a very tough time.  She shares about his decline following his discharge from the first hospitalization.   We discussed patient's current illness and what it means in the larger context of patient's on-going co-morbidities.  We discussed treatment for heart failure and pneumonia.  We discussed concern for dysphagia.  We discussed concern  for subacute CVA.  I attempted to elicit values and goals of care important to the patient.    The difference between aggressive medical intervention and comfort care was considered in light of the patient's goals of care.   Vaughan Basta shares that patient has been clear that he would not want to be reintubated.  Patient has already been made DNR/DNI.  Vaughan Basta shares they are open to all other medical interventions up to this point.  She is hopeful for some recovery from current situation.  Discussed with Vaughan Basta the importance of continued conversation with family and the medical providers regarding overall plan of care and treatment options, ensuring decisions are within the context of the patients values and GOCs.    We briefly discussed outpatient services available, particularly outpatient palliative.  We discussed that the type of support he will need outpatient will be determined as we get closer to that point and see how he is doing.  Questions and concerns were addressed. The family was encouraged to call with questions or concerns.  Primary Decision Maker NEXT OF KIN -spouse Vaughan Basta    SUMMARY OF RECOMMENDATIONS   DNR/DNI already established Family open to all other medical interventions at this point, hopeful for some recovery as he was very functional at his baseline PMT will follow along -will plan to see Monday  Code Status/Advance Care Planning: DNR     Primary Diagnoses: Present on Admission:  Acute respiratory failure with hypoxia ([place])  Acute on chronic combined systolic and diastolic CHF (congestive heart failure) ([place])  Pneumonia  Macrocytic anemia  Hyperlipidemia  Acute hypoxemic respiratory failure ([place])   I have reviewed the medical record, interviewed the patient and family, and examined  the patient. The following aspects are pertinent.  Past Medical History:  Diagnosis Date   Aortic insufficiency    Coronary artery disease    ESRD on peritoneal dialysis ([place])     Hyperlipidemia    Hypertension    Social History   Socioeconomic History   Marital status: Married    Spouse name: Not on file   Number of children: 4   Years of education: Not on file   Highest education level: Not on file  Occupational History   Occupation: general Marine scientist: RETIRED  Tobacco Use   Smoking status: Former    Types: Cigarettes    Quit date: 03/05/1966    Years since quitting: 56.0   Smokeless tobacco: Never  Substance and Sexual Activity   Alcohol use: No   Drug use: No   Sexual activity: Not on file  Other Topics Concern   Not on file  Social History Narrative   Not on file   Social Determinants of Health   Financial Resource Strain: Not on file  Food Insecurity: No Food Insecurity (03/14/2022)   Hunger Vital Sign    Worried About Running Out of Food in the Last Year: Never true    Ran Out of Food in the Last Year: Never true  Transportation Needs: No Transportation Needs (03/14/2022)   PRAPARE - Hydrologist (Medical): No    Lack of Transportation (Non-Medical): No  Physical Activity: Not on file  Stress: Not on file  Social Connections: Not on file   Family History  Problem Relation Age of Onset   Cancer Mother    Cirrhosis Father    Diabetes Brother    Scheduled Meds:  aspirin  81 mg Oral Daily   atorvastatin  20 mg Oral Daily   clopidogrel  75 mg Oral Daily   ezetimibe  10 mg Oral Daily   gentamicin cream  1 Application Topical Daily   heparin injection (subcutaneous)  5,000 Units Subcutaneous Q8H   [START ON 03/24/2022] metoprolol tartrate  25 mg Oral BID   midodrine  5 mg Oral TID WC   pantoprazole  40 mg Oral Daily   sevelamer carbonate  800 mg Oral TID WC   Continuous Infusions:  ampicillin-sulbactam (UNASYN) IV     dialysis solution 2.5% low-MG/low-CA     PRN Meds:.acetaminophen **OR** acetaminophen Allergies  Allergen Reactions   Nsaids Other (See Comments)    Chronic kidney  disease   Valacyclovir Nausea And Vomiting   Lisinopril Rash    Vital Signs: BP 110/70 (BP Location: Right Arm)   Pulse 65   Temp 98 F (36.7 C) (Oral)   Resp 20   Ht 6' (1.829 m)   Wt 83.9 kg   SpO2 97%   BMI 25.09 kg/m  Pain Scale: 0-10   Pain Score: 0-No pain   SpO2: SpO2: 97 % O2 Device:SpO2: 97 % O2 Flow Rate: .O2 Flow Rate (L/min): 3 L/min  IO: Intake/output summary:  Intake/Output Summary (Last 24 hours) at 03/23/2022 1228 Last data filed at 03/22/2022 2300 Gross per 24 hour  Intake 616.93 ml  Output --  Net 616.93 ml   Physical Exam Constitutional:      General: He is not in acute distress.    Appearance: He is ill-appearing.  Pulmonary:     Effort: Pulmonary effort is normal.  Neurological:     Mental Status: He is alert.      LBM: Last  BM Date :  (PTA) Baseline Weight: Weight: 83.9 kg Most recent weight: Weight: 83.9 kg      *Please note that this is a verbal dictation therefore any spelling or grammatical errors are due to the "[place] [employer]" system interpretation.  Juel Burrow, DNP, AGNP-C Palliative Medicine Team [phone] Pager: [phone]

## 2022-03-23 NOTE — Progress Notes (Signed)
Pharmacy Antibiotic Note  William [name] is a 82 y.o. male admitted on 03/22/2022 presenting with lethargy, recent pna tx.  Pharmacy has been consulted for vancomycin and cefepime dosing.  ESRD- PD  Plan: Cefepime 1G IV q24 hours Vancomycin 1500 mg IV x 1, then variable dosing thereafter based on PD and levels Add MRSA PCR Monitor PD, PCR and clinical progression to narrow Vancomycin random level as needed  Height: 6' (182.9 cm) Weight: 83.9 kg (185 lb) IBW/kg (Calculated) : 77.6  Temp (24hrs), Avg:98.5 F (36.9 C), Min:97.7 F (36.5 C), Max:100 F (37.8 C)  Recent Labs  Lab 03/22/22 1557 03/22/22 1756  WBC 12.4*  --   CREATININE 8.52*  --   LATICACIDVEN 2.1* 1.5     Estimated Creatinine Clearance: 7.6 mL/min (A) (by C-G formula based on SCr of 8.52 mg/dL (H)).    Allergies  Allergen Reactions   Nsaids Other (See Comments)    Chronic kidney disease   Valacyclovir Nausea And Vomiting   Lisinopril Rash    Georga Bora, PharmD Clinical Pharmacist 03/23/2022 5:58 AM Please check AMION for all [place] numbers

## 2022-03-23 NOTE — Consult Note (Signed)
Cardiology Consultation   Patient ID: William [name] MRN: [id number]; DOB: 1941-03-01  Admit date: 03/22/2022 Date of Consult: 03/23/2022  PCP:  [name], [place] [facility] Cardiologist:  Janina Mayo, MD   {   Patient Profile:   William [name] is a 82 y.o. male with a hx of ESRD on peritoneal dialysis, hypertension,  hypothyroidism, hyperlipidemia, CAD with recent non-STEMI 03/04/2022 s/p PTCA and DES to mid LAD 4/33/2951, systolic and diastolic heart failure,  who is being seen 03/23/2022 for the evaluation of CHF at the request of Dr. Candiss Norse.  History of Present Illness:   William [name] with above past medical history presented to the ER today for generalized weakness, confusion, poor oral intake.   Patient had a recent hospitalization here 03/02/2022 to 03/15/2022 for acute hypoxic respiratory failure due to multifocal pneumonia.  He had required ICU management with mechanical ventilation from 12/29 - 1/1 and vasopressor support.  Course was complicated by acute agitated delirium requiring Precedex gtt; NSTEMI with troponin peaked at 2695 on 03/08/2022 and EKG showing lateral ST depression.  Echo from 03/04/2022 revealed LVEF 40 to 45%, global hypokinesis, grade 1 DD, mildly reduced RV, moderate pleural effusion, mild MR, mild AI.  He was treated with IV heparin gtt. Left heart cath on 03/12/2022 showed multivessel CAD with 80 to 90% stenosis of mid LAD, moderate to severe disease of D1, RPDA distal LCx and rPLAV.  LVEDP elevated 25-30 mmHg. PCI was deferred due to inability to achieve therapeutic anticoagulation with IV heparin.  He refused intervention on 03/13/2022 and agreed on 03/14/2022, where he was treated with PTCA and DES to mid LAD.  He was recommended DAPT with aspirin and Plavix for 1 year, started on atorvastatin and Zetia, and switched from carvedilol to metoprolol for medical therapy.  He had brief episodes of paroxysmal atrial tachycardia,  where his carvedilol was switched to metoprolol 25 mg twice daily.  He was advised to 2-week event monitor at the time of discharge, as well as OSA evaluation. He has not completed the event monitor study.  He returned to the ER last night with family members who are complaining patient with generalized weakness, persistent confusion, and poor PO intake. There were numerous family members in the room to offer history, family states patient has been very weak and unsteady since his recent discharge 03/15/22. He remains confused since the discharge. He is not eating or drinking well. He is compliant with all his meds. He has not complained any episode of chest pain. He is on peritoneal dialysis, his wife and daughter had been handling it since he was last discharged. His BP was low and family called his nephrologist, who instructed the patient go to the ER. Family felt patient's confusion seems to improve currently during encounter. Patient states he has no complaints, and believed he is just visiting the hospital with his daughter. He can't lie flat historically due to reflux disease, denied any worsening SOB, orthopnea, PND. He is urinating well at home, no bleeding.   Admission diagnostic  revealed pH 7.39 on VBG, CMP with glucose 169, BUN 76, creatinine 8.52, albumin 2.6, and GFR 6.  CBC with leukocytosis 12 400, hemoglobin 9.8, PLT 199k.  INR 1.3.  Lactic acid 2.1> 1.5.  TSH WNL.  Procalcitonin 0.6.  Ammonia 12.  Lipid panel with HDL 37 and LDL 32.  CRP elevated 23.1 High sensitive troponin 153 >143.  BNP >4500.  Chest x-ray revealed developing  bilateral opacity with effusions suggest possible edema versus infiltrate.  CT head revealed subacute infarct of left occipital lobe with possible petechial hemorrhage with no mass effect.  MRI of brain is pending.  Blood culture x 2 sent.  Respiratory viral panel pending.  He is currently admitted to hospital medicine service, concerned of volume overload with acute  systolic and diastolic heart failure versus sepsis secondary to pneumonia, given IV Lasix 60 mg x 1 while continued on peritoneal dialysis, and given broad-spectrum antibiotic. He has acute metabolic encephalopathy, found to have subacute occipital CVA on brain imaging.  Nephrology was consulted for continued peritoneal dialysis, preferred to hold off transition to hemodialysis.  Palliative care was consulted for goals of care discussion, patient remains DNR/DNI, reports family is open to all medical intervention and hopeful for recovery.  Cardiology is consulted today for further input on CHF.     Past Medical History:  Diagnosis Date   Aortic insufficiency    Coronary artery disease    ESRD on peritoneal dialysis ([place])    Hyperlipidemia    Hypertension     Past Surgical History:  Procedure Laterality Date   APPENDECTOMY     CARDIOVASCULAR STRESS TEST  06/08/2009   EF 43%   CHOLECYSTECTOMY     CORONARY STENT INTERVENTION N/A 03/14/2022   Procedure: CORONARY STENT INTERVENTION;  Surgeon: Troy Sine, MD;  Location: [facility];  Service: Cardiovascular;  Laterality: N/A;   FOOT SURGERY     HERNIA REPAIR     LEFT HEART CATH AND CORONARY ANGIOGRAPHY N/A 03/12/2022   Procedure: LEFT HEART CATH AND CORONARY ANGIOGRAPHY;  Surgeon: Nelva Bush, MD;  Location: [facility];  Service: Cardiovascular;  Laterality: N/A;   US ECHOCARDIOGRAPHY  05/24/2009   EF 60%     Home Medications:  Prior to Admission medications   Medication Sig Start Date End Date Taking? Authorizing Provider  acetaminophen (TYLENOL) 325 MG tablet Take 2 tablets (650 mg total) by mouth every 6 (six) hours as needed for headache or mild pain. 03/15/22  Yes Mercy Riding, MD  aspirin 81 MG chewable tablet Chew 1 tablet (81 mg total) by mouth daily. 03/16/22 03/16/23 Yes Mercy Riding, MD  atorvastatin (LIPITOR) 20 MG tablet Take 1 tablet (20 mg total) by mouth daily. 03/16/22 06/14/22 Yes Mercy Riding, MD   clopidogrel (PLAVIX) 75 MG tablet Take 1 tablet (75 mg total) by mouth daily. 03/16/22 03/16/23 Yes Mercy Riding, MD  ezetimibe (ZETIA) 10 MG tablet Take 1 tablet (10 mg total) by mouth daily. 03/16/22 06/14/22 Yes Mercy Riding, MD  melatonin 3 MG TABS tablet Take 1 tablet (3 mg total) by mouth at bedtime. 03/15/22  Yes Mercy Riding, MD  metoprolol tartrate (LOPRESSOR) 25 MG tablet Take 1 tablet (25 mg total) by mouth 2 (two) times daily. 03/15/22 09/11/22 Yes Mercy Riding, MD  ondansetron (ZOFRAN) 4 MG tablet Take 1 tablet (4 mg total) by mouth daily as needed for up to 365 doses for nausea or vomiting. 03/15/22  Yes Mercy Riding, MD  pantoprazole (PROTONIX) 40 MG tablet Take 1 tablet (40 mg total) by mouth daily. 03/16/22 04/15/22 Yes Mercy Riding, MD  sevelamer carbonate (RENVELA) 800 MG tablet Take 1 tablet (800 mg total) by mouth 3 (three) times daily with meals. 03/15/22 06/13/22 Yes Mercy Riding, MD    Inpatient Medications: Scheduled Meds:  aspirin  81 mg Oral Daily   atorvastatin  20 mg Oral Daily   clopidogrel  75 mg Oral Daily   ezetimibe  10 mg Oral Daily   gentamicin cream  1 Application Topical Daily   heparin injection (subcutaneous)  5,000 Units Subcutaneous Q8H   [START ON 03/24/2022] metoprolol tartrate  25 mg Oral BID   midodrine  5 mg Oral TID WC   pantoprazole  40 mg Oral Daily   sevelamer carbonate  800 mg Oral TID WC   Continuous Infusions:  ampicillin-sulbactam (UNASYN) IV 3 g (03/23/22 1509)   dialysis solution 2.5% low-MG/low-CA     PRN Meds: acetaminophen **OR** acetaminophen  Allergies:    Allergies  Allergen Reactions   Nsaids Other (See Comments)    Chronic kidney disease   Valacyclovir Nausea And Vomiting   Lisinopril Rash    Social History:   Social History   Socioeconomic History   Marital status: Married    Spouse name: Not on file   Number of children: 4   Years of education: Not on file   Highest education level: Not on file  Occupational  History   Occupation: general Marine scientist: RETIRED  Tobacco Use   Smoking status: Former    Types: Cigarettes    Quit date: 03/05/1966    Years since quitting: 56.0   Smokeless tobacco: Never  Substance and Sexual Activity   Alcohol use: No   Drug use: No   Sexual activity: Not on file  Other Topics Concern   Not on file  Social History Narrative   Not on file   Social Determinants of Health   Financial Resource Strain: Not on file  Food Insecurity: No Food Insecurity (03/14/2022)   Hunger Vital Sign    Worried About Running Out of Food in the Last Year: Never true    Ran Out of Food in the Last Year: Never true  Transportation Needs: No Transportation Needs (03/14/2022)   PRAPARE - Hydrologist (Medical): No    Lack of Transportation (Non-Medical): No  Physical Activity: Not on file  Stress: Not on file  Social Connections: Not on file  Intimate Partner Violence: Not At Risk (03/14/2022)   Humiliation, Afraid, Rape, and Kick questionnaire    Fear of Current or Ex-Partner: No    Emotionally Abused: No    Physically Abused: No    Sexually Abused: No    Family History:    Family History  Problem Relation Age of Onset   Cancer Mother    Cirrhosis Father    Diabetes Brother      ROS:  ROS is limited as patient is mildly confused , denied any chest pain or discomfort, offers no complaints   Physical Exam/Data:   Vitals:   03/23/22 0400 03/23/22 0402 03/23/22 0500 03/23/22 1319  BP:  110/70  106/68  Pulse: 74 72 65   Resp: '14 20 20   '$ Temp:  98 F (36.7 C)  98 F (36.7 C)  TempSrc:  Oral  Axillary  SpO2: 95% 93% 97%   Weight:      Height:        Intake/Output Summary (Last 24 hours) at 03/23/2022 1519 Last data filed at 03/22/2022 2300 Gross per 24 hour  Intake 616.93 ml  Output --  Net 616.93 ml      03/22/2022    3:57 PM 03/13/2022   10:44 AM 03/13/2022    4:16 AM  Last 3 Weights  Weight (lbs) 185 lb  167 lb 15.9 oz  184 lb 11.9 oz  Weight (kg) 83.915 kg 76.2 kg 83.8 kg     Body mass index is 25.09 kg/m.  Vitals:  Vitals:   03/23/22 0500 03/23/22 1319  BP:  106/68  Pulse: 65   Resp: 20   Temp:  98 F (36.7 C)  SpO2: 97%    General Appearance: In no apparent distress, laying in bed, not toxic  HEENT: Normocephalic, atraumatic.  Neck: Thick neck, trachea midline, JVD elevated  Cardiovascular: Regular rate and rhythm, normal S1-S2,  no murmur Respiratory: Resting breathing unlabored, lungs sounds with crackles bilaterally, on Impact oxygen Gastrointestinal: Bowel sounds positive, abdomen soft, PD catheter in place  Extremities: Able to move all extremities, trace edema of BLE  GU: Condom Foley in place  Musculoskeletal: Normal muscle bulk and tone, global weakness and decondition  Skin: Intact, warm, dry. No rashes or petechiae noted in exposed areas.  Neurologic: Alert, oriented to self and place. Fluent speech, no facial droop, + cognitive deficit Psychiatric: Normal affect. Mood is appropriate.     EKG:  The EKG was personally reviewed and demonstrates:    EKG 03/22/2022 revealed sinus rhythm with ventricular rate of 74 bpm, inferolateral T wave inversion, similar to previous EKG.  Telemetry:  Telemetry was personally reviewed and demonstrates:    Sinus rhythm with PACs occasionally    Relevant CV Studies:   Echocardiogram 03/04/2022:  1. Left ventricular ejection fraction, by estimation, is 40 to 45%. The  left ventricle has mildly decreased function. The left ventricle  demonstrates global hypokinesis. There is mild left ventricular  hypertrophy. Left ventricular diastolic parameters  are consistent with Grade I diastolic dysfunction (impaired relaxation).   2. Right ventricular systolic function is mildly reduced. The right  ventricular size is normal. Tricuspid regurgitation signal is inadequate  for assessing PA pressure.   3. Moderate pleural effusion.   4. Mild mitral valve  regurgitation.   5. Aortic valve regurgitation is mild.   6. The inferior vena cava is normal in size with <50% respiratory  variability, suggesting right atrial pressure of 8 mmHg.   Comparison(s): No prior Echocardiogram.   Left heart catheterization 03/12/2022:  Conclusions: Multivessel coronary artery disease, as detailed below.  Most severe lesion is an 80-90% mid LAD stenosis.  There is also moderate to severe disease involving small branches (D1 and rPDA) as well as moderate stenosis affecting distal LCx and rPLAV. Moderately elevated left ventricular filling pressure (LVEDP 25-30 mmHg). Aborted attempt at PCI of the mid LAD due to inability to achieve therapeutic anticoagulation despite administration of 16,000 unit of IV heparin.   Recommendations: Consider PCI to mid LAD as soon as tomorrow using alternative intraprocedural anticoagulation strategy (i.e. bivalirudin). Dual antiplatelet therapy with aspirin and clopidogrel for at least 12 months. Consider escalation of fluid removal with peritoneal dialysis in the setting of moderately elevated LVEDP. Aggressive secondary prevention of coronary artery disease.   Coronary stent intervention 03/14/2022:    Mid LAD lesion is 85% stenosed.   Dist Cx lesion is 60% stenosed.   Prox Cx to Mid Cx lesion is 20% stenosed.   1st Diag lesion is 70% stenosed.   A drug-eluting stent was successfully placed.   Post intervention, there is a 0% residual stenosis.   Successful percutaneous coronary intervention to an 85 to 90% mildly tortuous mid LAD stenosis rated with PTCA, Cutting Balloon, and ultimate stenting with a 3.0 x 18 mm Onyx Frontier DES stent postdilated to  3.25 mm with the stenosis being reduced to 0%.   RECOMMENDATION: DAPT for minimum of 1 year.  Medical therapy for concomitant CAD.  The patient is on peritoneal dialysis.  Aggressive lipid-lowering therapy with target LDL less than 55.  Diagnostic Dominance:  Right  Intervention    Laboratory Data:  High Sensitivity Troponin:   Recent Labs  Lab 03/08/22 0917 03/08/22 1520 03/08/22 1850 03/22/22 1759 03/22/22 2341  TROPONINIHS 2,560* 1,534* 1,415* 153* 143*     Chemistry Recent Labs  Lab 03/22/22 1557 03/22/22 1605 03/23/22 0650  NA 135 132* 136  K 4.7 4.7 4.9  CL 94*  --  95*  CO2 25  --  22  GLUCOSE 169*  --  109*  BUN 76*  --  84*  CREATININE 8.52*  --  9.44*  CALCIUM 7.3*  --  7.3*  MG  --   --  2.1  GFRNONAA 6*  --  5*  ANIONGAP 16*  --  19*    Recent Labs  Lab 03/22/22 1557  PROT 6.3*  ALBUMIN 2.6*  AST 34  ALT 42  ALKPHOS 70  BILITOT 0.7   Lipids  Recent Labs  Lab 03/23/22 0649  CHOL 86  TRIG 85  HDL 37*  LDLCALC 32  CHOLHDL 2.3    Hematology Recent Labs  Lab 03/22/22 1557 03/22/22 1605 03/23/22 0650  WBC 12.4*  --  10.6*  RBC 2.92*  --  2.69*  HGB 9.8* 10.9* 9.0*  HCT 30.7* 32.0* 28.1*  MCV 105.1*  --  104.5*  MCH 33.6  --  33.5  MCHC 31.9  --  32.0  RDW 15.0  --  14.8  PLT 199  --  189   Thyroid  Recent Labs  Lab 03/23/22 0650  TSH 1.029    BNP Recent Labs  Lab 03/23/22 0650  BNP >4,500.0*    DDimer No results for input(s): "DDIMER" in the last 168 hours.   Radiology/Studies:  MR BRAIN WO CONTRAST  Result Date: 03/23/2022 CLINICAL DATA:  Neuro deficit, acute, stroke suspected.  Weakness. EXAM: MRI HEAD WITHOUT CONTRAST TECHNIQUE: Multiplanar, multiecho pulse sequences of the brain and surrounding structures were obtained without intravenous contrast was attempted. Only diffusion images were acquired due to patient's inability to tolerate the exam. COMPARISON:  Head CT 03/23/2022. FINDINGS: Brain: Acute infarct in the medial left occipital lobe extending into the tail of the left hippocampus and posterior aspect of the left parahippocampal gyrus. No other areas of abnormal restricted diffusion. Vascular: No abnormal diffusion signal. Skull and upper cervical spine: No  abnormal diffusion signal. Sinuses/Orbits: No abnormal diffusion signal. Other: None. IMPRESSION: Only diffusion images were acquired due to the patient's inability to tolerate the exam. 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 No other areas of restricted diffusion. Electronically Signed   By: Emmit Alexanders M.D.   On: 03/23/2022 14:04   CT HEAD WO CONTRAST (5MM)  Result Date: 03/23/2022 CLINICAL DATA:  Delirium EXAM: CT HEAD WITHOUT CONTRAST TECHNIQUE: Contiguous axial images were obtained from the base of the skull through the vertex without intravenous contrast. RADIATION DOSE REDUCTION: This exam was performed according to the departmental dose-optimization program which includes automated exposure control, adjustment of the mA and/or kV according to patient size and/or use of iterative reconstruction technique. COMPARISON:  CT head 01/21/2019 FINDINGS: Brain: There is hypodensity in the left occipital lobe suspicious for subacute infarct. There is possible trace petechial hemorrhage (3-15) but no mass effect. There is no  acute intracranial hemorrhage or extra-axial fluid collection. Parenchymal volume is within expected limits for age. The ventricles are stable in size. Patchy and confluent hypodensity throughout the remainder of the supratentorial white matter likely reflects sequela of chronic small-vessel ischemic change. There is a small remote infarct in the right cerebellar hemisphere. The pituitary and suprasellar region are normal. There is no mass lesion. There is no mass effect or midline shift. Vascular: There is calcification of the bilateral carotid siphons and vertebral arteries. Skull: Normal. Negative for fracture or focal lesion. Sinuses/Orbits: The imaged paranasal sinuses are clear. Bilateral lens implants are in place. The globes and orbits are otherwise unremarkable. Other: None. IMPRESSION: Suspected subacute infarct in the left occipital lobe with possible petechial hemorrhage but  no mass effect. Recommend brain MRI for better evaluation. These results will be called to the ordering clinician or representative by the Radiologist Assistant, and communication documented in the PACS or [employer]. Electronically Signed   By: Valetta Mole M.D.   On: 03/23/2022 09:38   DG Chest Port 1 View  Result Date: 03/23/2022 CLINICAL DATA:  Shortness of breath.  Concern for pneumonia. EXAM: PORTABLE CHEST 1 VIEW COMPARISON:  03/22/2022 FINDINGS: Unchanged cardiac enlargement. Decreased left pleural effusion. Diffuse bilateral interstitial and airspace opacities are again noted. Compared with the previous exam there is been improved aeration to both lower lung zones. IMPRESSION: 1. Persistent bilateral interstitial and airspace opacities with improved aeration to both lower lung zones. 2. Decrease in left pleural effusion. Electronically Signed   By: Kerby Moors M.D.   On: 03/23/2022 06:29   DG Chest Portable 1 View  Result Date: 03/22/2022 CLINICAL DATA:  Lethargic.  Decreasing blood pressure EXAM: PORTABLE CHEST 1 VIEW COMPARISON:  03/15/2022 x-ray and older FINDINGS: Enlarged cardiopericardial silhouette with increasing edema and perihilar opacities. Small effusions, left-greater-than-right. No pneumothorax. Tortuous and ectatic aorta. Overlapping cardiac leads. IMPRESSION: Developing bilateral opacities with effusions, possible edema. Secondary infiltrate is possible. No pneumothorax. Enlarged cardiopericardial silhouette. Electronically Signed   By: Jill Side M.D.   On: 03/22/2022 16:20     Assessment and Plan:   Acute hypoxic respiratory failure  Acute systolic and diastolic heart failure Volume overload with ESRD Hypoalbuminemia Suspected healthcare associated pneumonia versus aspiration pneumonia  -Presented with persistent confusion, generalized weakness, hypoxia, failure to thrive, low blood pressure since recent discharge 03/15/22 per family  -Diagnostic workup is  remarkable for ESRD, leukocytosis, anemia, lactic acidosis, hypoalbuminemia -Chest x-ray showed bilateral opacities with effusions, edema versus infiltrate; repeat chest x-ray today revealed persistent bilateral opacity with improved aeration -BNP >4500, high sensitive troponin flat 153 >143 -EKG without acute ischemic change - Last Echo 03/05/23 with LVEF 40-45%, 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 in the setting of non-STEMI -His clinical picture is complex and likely multifactorial than merely CHF decompensation, volume management per nephrology as patient is dialysis dependent at this time, defer to team regarding protein calorie malnutrition, anemia, pneumonia, deconditioning treatment if indicated - GDMT: Convert metoprolol '25mg'$  twice daily to XL at the time of discharge; blood pressure has no room for BiDil; not candidate for ARNI/MRA/SGLT2i due to ESRD  Multivessel CAD Recent non-STEMI s/p mid LAD DES 03/14/22  -Hs trop 153 >143 likely demand ischemia -EKG without acute ischemic change -Last echo as above -He has no chest pain at this time, poor historian due to mild confusion  -Continue medical therapy with PTA aspirin, Plavix, metoprolol, Lipitor, zetia   Hypertension -Pressure is low normal currently, okay to continue metoprolol as  long as able tolerate, started on midodrine '5mg'$  TID per nephrology  Acute metabolic encephalopathy Subacute occipital stroke Suspected healthcare associated pneumonia versus aspiration  ESRD on peritoneal dialysis Anemia Failure to thrive BPH -Per primary team   Risk Assessment/Risk Scores:   New York Heart Association (NYHA) Functional Class NYHA Class III     For questions or updates, please contact [place] Please consult [email or web address] for contact info under    Signed, Margie Billet, NP  03/23/2022 3:19 PM

## 2022-03-23 NOTE — H&P (Signed)
History and Physical    William [name] CZY:[id number] DOB: 21-Dec-1940 DOA: 03/22/2022  PCP: [name], PA-C  Patient coming from: Home  Chief Complaint: Generalized weakness  HPI: William [name] is a 82 y.o. male with medical history significant of chronic HFrEF, ESRD on PD, hypertension, hyperlipidemia, BPH.  Recently admitted 12/29-1/11 for acute hypoxic respiratory failure secondary to multifocal pneumonia and was intubated 12/29-1/1.  Hospital course complicated by NSTEMI requiring DES to mid LAD on 1/10 and delirium which required Precedex drip.  Given overall poor prognosis, he was made DNR/DNI.  He presented to [facility] ED yesterday for generalized weakness, confusion, and very poor oral intake.  In the ED, patient was tachypneic and oxygen saturation 82% on room air, placed on 2-3 L O2 with improvement of sats.  Temperature 100 F, not tachycardic.  Blood pressure low with systolic in the 09N.  Labs showing WBC 12.4, hemoglobin 9.8, platelet count 199k, BUN 76, creatinine 8.5, calcium 7.3, albumin 2.6, normal LFTs, UA pending, lactic acid 2.1> 1.5, INR 1.3, blood cultures drawn, VBG showing pH 7.39 and pCO2 43.4, COVID/influenza/RSV PCR pending, ammonia level normal, troponin 153> 143.  EKG without acute ischemic changes.  Chest x-ray showing developing bilateral opacities with effusions concerning for possible edema but secondary infiltrate is possible. Patient was given Zofran, vancomycin, cefepime, and IV fluid in the ED.  ED physician had a goals of care discussion with the patient's family and he is DNR/DNI. Dr. Alcario Drought sent a secure chat message to nephrology on-call informing them that the patient will be admitted.  Daughter states she brought the patient to the emergency room to be evaluated as his blood pressure was low in the 90s and oxygen saturation in the high 80s at home.  She has noticed that the patient has been confused over the past few days but not  agitated.  Reporting generalized weakness.  She has not noticed him having difficulty breathing.  They have continued to do peritoneal dialysis at home.  He did have some swelling of his legs which resolved with compression stockings.  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  Patient has no complaints.  He denies shortness of breath or chest pain.  Not able to give any additional history.  Review of Systems:  Review of Systems  All other systems reviewed and are negative.   Past Medical History:  Diagnosis Date   Aortic insufficiency    Coronary artery disease    ESRD on peritoneal dialysis ([place])    Hyperlipidemia    Hypertension     Past Surgical History:  Procedure Laterality Date   APPENDECTOMY     CARDIOVASCULAR STRESS TEST  06/08/2009   EF 43%   CHOLECYSTECTOMY     CORONARY STENT INTERVENTION N/A 03/14/2022   Procedure: CORONARY STENT INTERVENTION;  Surgeon: Troy Sine, MD;  Location: [facility];  Service: Cardiovascular;  Laterality: N/A;   FOOT SURGERY     HERNIA REPAIR     LEFT HEART CATH AND CORONARY ANGIOGRAPHY N/A 03/12/2022   Procedure: LEFT HEART CATH AND CORONARY ANGIOGRAPHY;  Surgeon: Nelva Bush, MD;  Location: [facility];  Service: Cardiovascular;  Laterality: N/A;   US ECHOCARDIOGRAPHY  05/24/2009   EF 60%     reports that he quit smoking about 56 years ago. His smoking use included cigarettes. He has never used smokeless tobacco. He reports that he does not drink alcohol and  does not use drugs.  Allergies  Allergen Reactions   Nsaids Other (See Comments)    Chronic kidney disease   Valacyclovir Nausea And Vomiting   Lisinopril Rash    Family History  Problem Relation Age of Onset   Cancer Mother    Cirrhosis Father    Diabetes Brother     Prior to Admission medications   Medication Sig Start Date End Date Taking? Authorizing Provider  acetaminophen (TYLENOL) 325 MG  tablet Take 2 tablets (650 mg total) by mouth every 6 (six) hours as needed for headache or mild pain. 03/15/22   Mercy Riding, MD  aspirin 81 MG chewable tablet Chew 1 tablet (81 mg total) by mouth daily. 03/16/22 03/16/23  Mercy Riding, MD  atorvastatin (LIPITOR) 20 MG tablet Take 1 tablet (20 mg total) by mouth daily. 03/16/22 06/14/22  Mercy Riding, MD  clopidogrel (PLAVIX) 75 MG tablet Take 1 tablet (75 mg total) by mouth daily. 03/16/22 03/16/23  Mercy Riding, MD  ezetimibe (ZETIA) 10 MG tablet Take 1 tablet (10 mg total) by mouth daily. 03/16/22 06/14/22  Mercy Riding, MD  melatonin 3 MG TABS tablet Take 1 tablet (3 mg total) by mouth at bedtime. 03/15/22   Mercy Riding, MD  metoprolol tartrate (LOPRESSOR) 25 MG tablet Take 1 tablet (25 mg total) by mouth 2 (two) times daily. 03/15/22 09/11/22  Mercy Riding, MD  ondansetron (ZOFRAN) 4 MG tablet Take 1 tablet (4 mg total) by mouth daily as needed for up to 365 doses for nausea or vomiting. 03/15/22   Mercy Riding, MD  pantoprazole (PROTONIX) 40 MG tablet Take 1 tablet (40 mg total) by mouth daily. 03/16/22 04/15/22  Mercy Riding, MD  sevelamer carbonate (RENVELA) 800 MG tablet Take 1 tablet (800 mg total) by mouth 3 (three) times daily with meals. 03/15/22 06/13/22  Mercy Riding, MD    Physical Exam: Vitals:   03/23/22 0015 03/23/22 0245 03/23/22 0308 03/23/22 0402  BP: 112/64  118/75 110/70  Pulse: 66  71 72  Resp: (!) 25  (!) 26 20  Temp: 98.8 F (37.1 C) 98.1 F (36.7 C) 98.1 F (36.7 C) 98 F (36.7 C)  TempSrc:    Oral  SpO2: 95%  94% 93%  Weight:      Height:        Physical Exam Vitals reviewed.  Constitutional:      General: He is not in acute distress. HENT:     Head: Normocephalic and atraumatic.  Eyes:     Extraocular Movements: Extraocular movements intact.  Cardiovascular:     Rate and Rhythm: Normal rate and regular rhythm.     Pulses: Normal pulses.  Pulmonary:     Effort: Pulmonary effort is normal. No  respiratory distress.     Breath sounds: Rales present. No wheezing.  Abdominal:     General: Bowel sounds are normal. There is no distension.     Palpations: Abdomen is soft.     Tenderness: There is no abdominal tenderness.  Musculoskeletal:     Cervical back: Normal range of motion.     Right lower leg: No edema.     Left lower leg: No edema.  Skin:    General: Skin is warm and dry.  Neurological:     General: No focal deficit present.     Mental Status: He is alert.     Comments: Oriented to self, knows he is at hospital,  knows the year but not the month or date He is able to recognize his daughter at bedside     Labs on Admission: I have personally reviewed following labs and imaging studies  CBC: Recent Labs  Lab 03/22/22 1557 03/22/22 1605  WBC 12.4*  --   NEUTROABS 10.8*  --   HGB 9.8* 10.9*  HCT 30.7* 32.0*  MCV 105.1*  --   PLT 199  --    Basic Metabolic Panel: Recent Labs  Lab 03/22/22 1557 03/22/22 1605  NA 135 132*  K 4.7 4.7  CL 94*  --   CO2 25  --   GLUCOSE 169*  --   BUN 76*  --   CREATININE 8.52*  --   CALCIUM 7.3*  --    GFR: Estimated Creatinine Clearance: 7.6 mL/min (A) (by C-G formula based on SCr of 8.52 mg/dL (H)). Liver Function Tests: Recent Labs  Lab 03/22/22 1557  AST 34  ALT 42  ALKPHOS 70  BILITOT 0.7  PROT 6.3*  ALBUMIN 2.6*   No results for input(s): "LIPASE", "AMYLASE" in the last 168 hours. Recent Labs  Lab 03/22/22 1657  AMMONIA 12   Coagulation Profile: Recent Labs  Lab 03/22/22 1557  INR 1.3*   Cardiac Enzymes: No results for input(s): "CKTOTAL", "CKMB", "CKMBINDEX", "TROPONINI" in the last 168 hours. BNP (last 3 results) No results for input(s): "PROBNP" in the last 8760 hours. HbA1C: No results for input(s): "HGBA1C" in the last 72 hours. CBG: Recent Labs  Lab 03/22/22 1556  GLUCAP 150*   Lipid Profile: No results for input(s): "CHOL", "HDL", "LDLCALC", "TRIG", "CHOLHDL", "LDLDIRECT" in the  last 72 hours. Thyroid Function Tests: No results for input(s): "TSH", "T4TOTAL", "FREET4", "T3FREE", "THYROIDAB" in the last 72 hours. Anemia Panel: No results for input(s): "VITAMINB12", "FOLATE", "FERRITIN", "TIBC", "IRON", "RETICCTPCT" in the last 72 hours. Urine analysis:    Component Value Date/Time   COLORURINE YELLOW 12/30/2014 1820   APPEARANCEUR CLOUDY (A) 12/30/2014 1820   LABSPEC 1.013 12/30/2014 1820   PHURINE 5.5 12/30/2014 1820   GLUCOSEU NEGATIVE 12/30/2014 1820   HGBUR TRACE (A) 12/30/2014 1820   BILIRUBINUR NEGATIVE 12/30/2014 1820   KETONESUR NEGATIVE 12/30/2014 1820   PROTEINUR 100 (A) 12/30/2014 1820   UROBILINOGEN 0.2 12/30/2014 1820   NITRITE NEGATIVE 12/30/2014 1820   LEUKOCYTESUR LARGE (A) 12/30/2014 1820    Radiological Exams on Admission: DG Chest Portable 1 View  Result Date: 03/22/2022 CLINICAL DATA:  Lethargic.  Decreasing blood pressure EXAM: PORTABLE CHEST 1 VIEW COMPARISON:  03/15/2022 x-ray and older FINDINGS: Enlarged cardiopericardial silhouette with increasing edema and perihilar opacities. Small effusions, left-greater-than-right. No pneumothorax. Tortuous and ectatic aorta. Overlapping cardiac leads. IMPRESSION: Developing bilateral opacities with effusions, possible edema. Secondary infiltrate is possible. No pneumothorax. Enlarged cardiopericardial silhouette. Electronically Signed   By: Jill Side M.D.   On: 03/22/2022 16:20    EKG: Independently reviewed.  Sinus rhythm with first-degree AV block. ST depressions and T wave inversions inferolaterally seen on prior tracing as well.  No acute ischemic changes.  Assessment and Plan  Acute hypoxemic respiratory failure Acute on chronic HFmrEF/ ESRD on PD/ volume overload Possible bilateral pneumonia and severe sepsis Oxygen saturation 82% on room air, currently satting well on 3 L O2.  Chest x-ray showing developing bilateral opacities with effusions concerning for possible edema but secondary  infiltrate is possible.  Echo done 03/04/2022 showing EF 40 to 85%, grade 1 diastolic dysfunction.  Did meet criteria for severe sepsis at  the time of presentation with low-grade fever, tachypnea, leukocytosis, lactic acidosis, low blood pressure, and acute hypoxemia.  Lactic acidosis has now resolved and blood pressure improved. -Nephrology consulted for dialysis -Check BNP -Continue vancomycin and cefepime -MRSA PCR screen -Sputum Gram stain and culture -Strep pneumo and Legionella urinary antigens -COVID/influenza/RSV PCR pending -Blood cultures pending -Trend WBC count and check procalcitonin level  Acute metabolic encephalopathy Patient appears confused but no obvious focal neurodeficit.  Ammonia level normal.  Blood gas without evidence of hypercapnia. -Stat CT head -Check TSH and B12 levels  CAD with recent NSTEMI status post PCI Elevated troponin Troponin elevation likely due to demand ischemia, elevated but flat (153 > 143) and not consistent with ACS.  Patient is not endorsing chest pain. -Continue aspirin, Plavix, beta-blocker, statin after pharmacy med rec is done.  Macrocytic anemia Hemoglobin 9.8, previously in the 8-11 range over the past few weeks.  No signs of bleeding. -Continue to monitor -Checking B12 and folate  Hypertension -Avoid antihypertensives at this time given concern for low blood pressure.  Hyperlipidemia -Pharmacy med rec pending.  BPH -Pharmacy med rec pending.  Failure to thrive Given overall poor prognosis, I had a goals of care discussion with the patient's daughter William [name] and she confirms that he is DNR/DNI and that patient's wife would not want repeat intubation if his respiratory status declined.  Family is interested in continuing treatment for now and nephrology consultation for dialysis.  Not yet ready to transition him to hospice but interested in speaking to palliative care.  -Palliative care consulted  DVT prophylaxis: SCDs Code  Status: DNR/DNI (discussed with the patient's daughter) Family Communication: Daughter William [name] at bedside. Consults called: Nephrology Level of care: Progressive Care Unit Admission status: It is my clinical opinion that referral for OBSERVATION is reasonable and necessary in this patient based on the above information provided. The aforementioned taken together are felt to place the patient at high risk for further clinical deterioration. However, it is anticipated that the patient may be medically stable for discharge from the hospital within 24 to 48 hours.   Shela Leff MD [facility]  If 7PM-7AM, please contact night-coverage [email or web address]  03/23/2022, 4:22 AM

## 2022-03-23 NOTE — Consult Note (Signed)
Reason for Consult: To manage dialysis and dialysis related needs Referring Physician: DR Daleen Squibb Negro is an 82 y.o. male.  HPI: Pt is an 53 M with a PMH Sig for ESRD on PD, HLD, HTN, and recent prolonged admit for NSTEMI s/p PCI who is now seen in consultation at the request of Dr. Candiss Norse for eval and recs re: management of ESRD and provision of PD.    Pt recently discharged from [facility] 03/15/22 after admit for NSTEMI, volume overload, pneumonia, required intubation.  Discharged home and was doing PD there.    Comes in for progressive weakness, found to have recurrent PNA vs volume overload.  Pressures soft.    Seen in room.  Was up all night exhausted and hearing aids aren't in.  Family at bedside.  Discussed PD script with them- appears that they had machine malfunctions the past several nights and were doing manuals some.  Replaced machine 2 nights ago and has been doing better since.    CCPD, Dr Javier Glazier, 4 exchanges, 2.5L fill.  Mostly 1.5-2.5% bags.  EDW being adjusted.  Past Medical History:  Diagnosis Date   Aortic insufficiency    Coronary artery disease    ESRD on peritoneal dialysis ([place])    Hyperlipidemia    Hypertension     Past Surgical History:  Procedure Laterality Date   APPENDECTOMY     CARDIOVASCULAR STRESS TEST  06/08/2009   EF 43%   CHOLECYSTECTOMY     CORONARY STENT INTERVENTION N/A 03/14/2022   Procedure: CORONARY STENT INTERVENTION;  Surgeon: Troy Sine, MD;  Location: [facility];  Service: Cardiovascular;  Laterality: N/A;   FOOT SURGERY     HERNIA REPAIR     LEFT HEART CATH AND CORONARY ANGIOGRAPHY N/A 03/12/2022   Procedure: LEFT HEART CATH AND CORONARY ANGIOGRAPHY;  Surgeon: Nelva Bush, MD;  Location: [facility];  Service: Cardiovascular;  Laterality: N/A;   US ECHOCARDIOGRAPHY  05/24/2009   EF 60%    Family History  Problem Relation Age of Onset   Cancer Mother    Cirrhosis Father    Diabetes Brother     Social  History:  reports that he quit smoking about 56 years ago. His smoking use included cigarettes. He has never used smokeless tobacco. He reports that he does not drink alcohol and does not use drugs.  Allergies:  Allergies  Allergen Reactions   Nsaids Other (See Comments)    Chronic kidney disease   Valacyclovir Nausea And Vomiting   Lisinopril Rash    Medications: Scheduled:  aspirin  81 mg Oral Daily   atorvastatin  20 mg Oral Daily   clopidogrel  75 mg Oral Daily   ezetimibe  10 mg Oral Daily   gentamicin cream  1 Application Topical Daily   heparin injection (subcutaneous)  5,000 Units Subcutaneous Q8H   [START ON 03/24/2022] metoprolol tartrate  25 mg Oral BID   midodrine  5 mg Oral TID WC   pantoprazole  40 mg Oral Daily   sevelamer carbonate  800 mg Oral TID WC     Results for orders placed or performed during the hospital encounter of 03/22/22 (from the past 48 hour(s))  CBG monitoring, ED     Status: Abnormal   Collection Time: 03/22/22  3:56 PM  Result Value Ref Range   Glucose-Capillary 150 (H) 70 - 99 mg/dL    Comment: Glucose reference range applies only to samples taken after fasting for at  least 8 hours.  Comprehensive metabolic panel     Status: Abnormal   Collection Time: 03/22/22  3:57 PM  Result Value Ref Range   Sodium 135 135 - 145 mmol/L   Potassium 4.7 3.5 - 5.1 mmol/L   Chloride 94 (L) 98 - 111 mmol/L   CO2 25 22 - 32 mmol/L   Glucose, Bld 169 (H) 70 - 99 mg/dL    Comment: Glucose reference range applies only to samples taken after fasting for at least 8 hours.   BUN 76 (H) 8 - 23 mg/dL   Creatinine, Ser 8.52 (H) 0.61 - 1.24 mg/dL   Calcium 7.3 (L) 8.9 - 10.3 mg/dL   Total Protein 6.3 (L) 6.5 - 8.1 g/dL   Albumin 2.6 (L) 3.5 - 5.0 g/dL   AST 34 15 - 41 U/L   ALT 42 0 - 44 U/L   Alkaline Phosphatase 70 38 - 126 U/L   Total Bilirubin 0.7 0.3 - 1.2 mg/dL   GFR, Estimated 6 (L) >60 mL/min    Comment: (NOTE) Calculated using the CKD-EPI Creatinine  Equation (2021)    Anion gap 16 (H) 5 - 15    Comment: Performed at [facility], [address] [place], [place] [address]  Lactic acid, plasma     Status: Abnormal   Collection Time: 03/22/22  3:57 PM  Result Value Ref Range   Lactic Acid, Venous 2.1 (HH) 0.5 - 1.9 mmol/L    Comment: CRITICAL RESULT CALLED TO, READ BACK BY AND VERIFIED WITH PEGRAM,J RN '@1635'$  1.18.24 EDENSCA Performed at [facility], [address] [place], [place] [address]   CBC with Differential     Status: Abnormal   Collection Time: 03/22/22  3:57 PM  Result Value Ref Range   WBC 12.4 (H) 4.0 - 10.5 K/uL   RBC 2.92 (L) 4.22 - 5.81 MIL/uL   Hemoglobin 9.8 (L) 13.0 - 17.0 g/dL   HCT 30.7 (L) 39.0 - 52.0 %   MCV 105.1 (H) 80.0 - 100.0 fL   MCH 33.6 26.0 - 34.0 pg   MCHC 31.9 30.0 - 36.0 g/dL   RDW 15.0 11.5 - 15.5 %   Platelets 199 150 - 400 K/uL   nRBC 0.0 0.0 - 0.2 %   Neutrophils Relative % 87 %   Neutro Abs 10.8 (H) 1.7 - 7.7 K/uL   Lymphocytes Relative 3 %   Lymphs Abs 0.4 (L) 0.7 - 4.0 K/uL   Monocytes Relative 8 %   Monocytes Absolute 1.0 0.1 - 1.0 K/uL   Eosinophils Relative 0 %   Eosinophils Absolute 0.0 0.0 - 0.5 K/uL   Basophils Relative 1 %   Basophils Absolute 0.1 0.0 - 0.1 K/uL   Immature Granulocytes 1 %   Abs Immature Granulocytes 0.06 0.00 - 0.07 K/uL    Comment: Performed at [facility], [address] [place], [place] [address]  Protime-INR     Status: Abnormal   Collection Time: 03/22/22  3:57 PM  Result Value Ref Range   Prothrombin Time 15.7 (H) 11.4 - 15.2 seconds   INR 1.3 (H) 0.8 - 1.2    Comment: (NOTE) INR goal varies based on device and disease states. Performed at [facility], [address] [place], [place] [address]   Culture, blood (Routine x 2)     Status: None (Preliminary result)   Collection Time: 03/22/22  4:00 PM   Specimen: Left  Antecubital; Blood  Result Value Ref Range   Specimen Description      LEFT  ANTECUBITAL BLOOD Performed at [facility], [address] [place], [place] [address]    Special Requests      Blood Culture adequate volume BOTTLES DRAWN AEROBIC AND ANAEROBIC Performed at [facility], [address] [place], [place] [address]    Culture      NO GROWTH < 24 HOURS Performed at [facility], [place] [address], [place], [place] [address]    Report Status PENDING   I-Stat venous blood gas, ([facility], [facility], [facility])     Status: Abnormal   Collection Time: 03/22/22  4:05 PM  Result Value Ref Range   pH, Ven 7.395 7.25 - 7.43   pCO2, Ven 43.4 (L) 44 - 60 mmHg   pO2, Ven 16 (LL) 32 - 45 mmHg   Bicarbonate 26.6 20.0 - 28.0 mmol/L   TCO2 28 22 - 32 mmol/L   O2 Saturation 22 %   Acid-Base Excess 1.0 0.0 - 2.0 mmol/L   Sodium 132 (L) 135 - 145 mmol/L   Potassium 4.7 3.5 - 5.1 mmol/L   Calcium, Ion 0.92 (L) 1.15 - 1.40 mmol/L   HCT 32.0 (L) 39.0 - 52.0 %   Hemoglobin 10.9 (L) 13.0 - 17.0 g/dL   Collection site IV start    Drawn by Nurse    Sample type VENOUS    Comment NOTIFIED PHYSICIAN   Culture, blood (Routine x 2)     Status: None (Preliminary result)   Collection Time: 03/22/22  4:05 PM   Specimen: BLOOD LEFT WRIST  Result Value Ref Range   Specimen Description      BLOOD LEFT WRIST BLOOD Performed at [facility], [address] [place], [place] [address]    Special Requests      Blood Culture adequate volume BOTTLES DRAWN AEROBIC AND ANAEROBIC Performed at [facility], [address] [place], [place] [address]    Culture      NO GROWTH < 24 HOURS Performed at [facility], [place] [address] [place],  [address]    Report Status PENDING   Ammonia     Status: None   Collection Time: 03/22/22  4:57 PM  Result Value Ref Range   Ammonia 12 9 - 35 umol/L    Comment: Performed at [facility], [address] [place], [place] [address]  Lactic acid, plasma     Status: None   Collection Time:  03/22/22  5:56 PM  Result Value Ref Range   Lactic Acid, Venous 1.5 0.5 - 1.9 mmol/L    Comment: Performed at [facility], [address] [place], [place] [address]  Troponin I (High Sensitivity)     Status: Abnormal   Collection Time: 03/22/22  5:59 PM  Result Value Ref Range   Troponin I (High Sensitivity) 153 (HH) <18 ng/L    Comment: CRITICAL RESULT CALLED TO, READ BACK BY AND VERIFIED WITH PEGRAM,J RN '@1903'$  1.18.24 EDENSCA (NOTE) Elevated high sensitivity troponin I (hsTnI) values and significant  changes across serial measurements may suggest ACS but many other  chronic and acute conditions are known to elevate hsTnI results.  Refer to the "Links" section for chest pain algorithms and additional  guidance. Performed at [facility], [address] [place], [place] [address]   Troponin  I (High Sensitivity)     Status: Abnormal   Collection Time: 03/22/22 11:41 PM  Result Value Ref Range   Troponin I (High Sensitivity) 143 (HH) <18 ng/L    Comment: CRITICAL VALUE NOTED. VALUE IS CONSISTENT WITH PREVIOUSLY REPORTED/CALLED VALUE (NOTE) Elevated high sensitivity troponin I (hsTnI) values and significant  changes across serial measurements may suggest ACS but many other  chronic and acute conditions are known to elevate hsTnI results.  Refer to the "Links" section for chest pain algorithms and additional  guidance. Performed at [facility], [address] [place], [place] [address]   Lipid panel     Status: Abnormal   Collection Time: 03/23/22  6:49 AM  Result Value Ref Range   Cholesterol 86 0 - 200 mg/dL   Triglycerides 85 <150 mg/dL   HDL 37 (L) >40 mg/dL   Total CHOL/HDL Ratio 2.3 RATIO   VLDL 17 0 - 40 mg/dL   LDL Cholesterol 32 0 - 99 mg/dL    Comment:        Total Cholesterol/HDL:CHD Risk Coronary Heart Disease Risk Table                     Men   Women  1/2 Average Risk   3.4   3.3  Average Risk       5.0   4.4  2 X Average  Risk   9.6   7.1  3 X Average Risk  23.4   11.0        Use the calculated Patient Ratio above and the CHD Risk Table to determine the patient's CHD Risk.        ATP III CLASSIFICATION (LDL):  <100     mg/dL   Optimal  100-129  mg/dL   Near or Above                    Optimal  130-159  mg/dL   Borderline  160-189  mg/dL   High  >190     mg/dL   Very High Performed at [facility] [address] [place], [place] [address]   Procalcitonin - Baseline     Status: None   Collection Time: 03/23/22  6:50 AM  Result Value Ref Range   Procalcitonin 0.60 ng/mL    Comment:        Interpretation: PCT > 0.5 ng/mL and <= 2 ng/mL: Systemic infection (sepsis) is possible, but other conditions are known to elevate PCT as well. (NOTE)       Sepsis PCT Algorithm           Lower Respiratory Tract                                      Infection PCT Algorithm    ----------------------------     ----------------------------         PCT < 0.25 ng/mL                PCT < 0.10 ng/mL          Strongly encourage             Strongly discourage   discontinuation of antibiotics    initiation of antibiotics    ----------------------------     -----------------------------       PCT 0.25 - 0.50 ng/mL  PCT 0.10 - 0.25 ng/mL               OR       >80% decrease in PCT            Discourage initiation of                                            antibiotics      Encourage discontinuation           of antibiotics    ----------------------------     -----------------------------         PCT >= 0.50 ng/mL              PCT 0.26 - 0.50 ng/mL                AND       <80% decrease in PCT             Encourage initiation of                                             antibiotics       Encourage continuation           of antibiotics    ----------------------------     -----------------------------        PCT >= 0.50 ng/mL                  PCT > 0.50 ng/mL               AND         increase  in PCT                  Strongly encourage                                      initiation of antibiotics    Strongly encourage escalation           of antibiotics                                     -----------------------------                                           PCT <= 0.25 ng/mL                                                 OR                                        > 80% decrease in PCT  Discontinue / Do not initiate                                             antibiotics  Performed at [facility], [place] [address], [place], [place] [address]   Vitamin B12     Status: None   Collection Time: 03/23/22  6:50 AM  Result Value Ref Range   Vitamin B-12 770 180 - 914 pg/mL    Comment: (NOTE) This assay is not validated for testing neonatal or myeloproliferative syndrome specimens for Vitamin B12 levels. Performed at [facility], [place] [address], [place], [place] [address]   CBC     Status: Abnormal   Collection Time: 03/23/22  6:50 AM  Result Value Ref Range   WBC 10.6 (H) 4.0 - 10.5 K/uL   RBC 2.69 (L) 4.22 - 5.81 MIL/uL   Hemoglobin 9.0 (L) 13.0 - 17.0 g/dL   HCT 28.1 (L) 39.0 - 52.0 %   MCV 104.5 (H) 80.0 - 100.0 fL   MCH 33.5 26.0 - 34.0 pg   MCHC 32.0 30.0 - 36.0 g/dL   RDW 14.8 11.5 - 15.5 %   Platelets 189 150 - 400 K/uL   nRBC 0.0 0.0 - 0.2 %    Comment: Performed at [facility], [place] [address] [place], [place] [address]  Brain natriuretic peptide     Status: Abnormal   Collection Time: 03/23/22  6:50 AM  Result Value Ref Range   B Natriuretic Peptide >4,500.0 (H) 0.0 - 100.0 pg/mL    Comment: Performed at [facility] [address] [place], [place] [address]  TSH     Status: None   Collection Time: 03/23/22  6:50 AM  Result Value Ref Range   TSH 1.029 0.350 - 4.500 uIU/mL    Comment: Performed by a 3rd Generation assay with a functional sensitivity of <=0.01 uIU/mL. Performed at [facility], [place] [address] [place], [place] [address]   C-reactive protein     Status: Abnormal   Collection Time: 03/23/22  6:50 AM  Result Value Ref Range   CRP 23.1 (H) <1.0 mg/dL    Comment: Performed at [facility] [address], [place], [place] [address]  Magnesium     Status: None   Collection Time: 03/23/22  6:50 AM  Result Value Ref Range   Magnesium 2.1 1.7 - 2.4 mg/dL    Comment: Performed at [facility] [address] [place], [place] [address]  Basic metabolic panel     Status: Abnormal   Collection Time: 03/23/22  6:50 AM  Result Value Ref Range   Sodium 136 135 - 145 mmol/L   Potassium 4.9 3.5 - 5.1 mmol/L   Chloride 95 (L) 98 - 111 mmol/L   CO2 22 22 - 32 mmol/L   Glucose, Bld 109 (H) 70 - 99 mg/dL    Comment: Glucose reference range applies only to samples taken after fasting for at least 8 hours.   BUN 84 (H) 8 - 23 mg/dL   Creatinine, Ser 9.44 (H) 0.61 - 1.24 mg/dL   Calcium 7.3 (L) 8.9 - 10.3 mg/dL   GFR, Estimated 5 (L) >60 mL/min    Comment: (NOTE) Calculated using the CKD-EPI Creatinine Equation (2021)    Anion gap 19 (H) 5 -  15    Comment: Performed at [facility], [place] [address], [place], [place] [address]  Folate     Status: None   Collection Time: 03/23/22  6:50 AM  Result Value Ref Range   Folate 10.1 >5.9 ng/mL    Comment: Performed at [facility] [address], [place], [place] [address]  MRSA Next Gen by PCR, Nasal     Status: None   Collection Time: 03/23/22  7:26 AM   Specimen: Nasal Mucosa; Nasal Swab  Result Value Ref Range   MRSA by PCR Next Gen NOT DETECTED NOT DETECTED    Comment: (NOTE) The GeneXpert MRSA Assay (FDA approved for NASAL specimens only), is one component of a comprehensive MRSA colonization surveillance program. It is not intended to diagnose MRSA infection nor to guide or monitor treatment for MRSA infections. Test performance is not FDA approved in patients less than 47  years old. Performed at [facility], [place] [address] [place], [place] [address]     CT HEAD WO CONTRAST (5MM)  Result Date: 03/23/2022 CLINICAL DATA:  [place] EXAM: CT HEAD WITHOUT CONTRAST TECHNIQUE: Contiguous axial images were obtained from the base of the skull through the vertex without intravenous contrast. RADIATION DOSE REDUCTION: This exam was performed according to the departmental dose-optimization program which includes automated exposure control, adjustment of the mA and/or kV according to patient size and/or use of iterative reconstruction technique. COMPARISON:  CT head 01/21/2019 FINDINGS: Brain: There is hypodensity in the left occipital lobe suspicious for subacute infarct. There is possible trace petechial hemorrhage (3-15) but no mass effect. There is no acute intracranial hemorrhage or extra-axial fluid collection. Parenchymal volume is within expected limits for age. The ventricles are stable in size. Patchy and confluent hypodensity throughout the remainder of the supratentorial white matter likely reflects sequela of chronic small-vessel ischemic change. There is a small remote infarct in the right cerebellar hemisphere. The pituitary and suprasellar region are normal. There is no mass lesion. There is no mass effect or midline shift. Vascular: There is calcification of the bilateral carotid siphons and vertebral arteries. Skull: Normal. Negative for fracture or focal lesion. Sinuses/Orbits: The imaged paranasal sinuses are clear. Bilateral lens implants are in place. The globes and orbits are otherwise unremarkable. Other: None. IMPRESSION: Suspected subacute infarct in the left occipital lobe with possible petechial hemorrhage but no mass effect. Recommend brain MRI for better evaluation. These results will be called to the ordering clinician or representative by the Radiologist Assistant, and communication documented in the PACS or [employer]. Electronically Signed    By: Valetta Mole M.D.   On: 03/23/2022 09:38   DG Chest Port 1 View  Result Date: 03/23/2022 CLINICAL DATA:  Shortness of breath.  Concern for pneumonia. EXAM: PORTABLE CHEST 1 VIEW COMPARISON:  03/22/2022 FINDINGS: Unchanged cardiac enlargement. Decreased left pleural effusion. Diffuse bilateral interstitial and airspace opacities are again noted. Compared with the previous exam there is been improved aeration to both lower lung zones. IMPRESSION: 1. Persistent bilateral interstitial and airspace opacities with improved aeration to both lower lung zones. 2. Decrease in left pleural effusion. Electronically Signed   By: Kerby Moors M.D.   On: 03/23/2022 06:29   DG Chest Portable 1 View  Result Date: 03/22/2022 CLINICAL DATA:  Lethargic.  Decreasing blood pressure EXAM: PORTABLE CHEST 1 VIEW COMPARISON:  03/15/2022 x-ray and older FINDINGS: Enlarged cardiopericardial silhouette with increasing edema and perihilar opacities. Small effusions, left-greater-than-right. No pneumothorax.  Tortuous and ectatic aorta. Overlapping cardiac leads. IMPRESSION: Developing bilateral opacities with effusions, possible edema. Secondary infiltrate is possible. No pneumothorax. Enlarged cardiopericardial silhouette. Electronically Signed   By: Jill Side M.D.   On: 03/22/2022 16:20    ROS: all other systems reviewed and are negative except as per HPI Blood pressure 110/70, pulse 65, temperature 98 F (36.7 C), temperature source Oral, resp. rate 20, height 6' (1.829 m), weight 83.9 kg, SpO2 97 %. GEN  older gentleman, sleeping HEENT EOMI PERRL NECK + elevated JVP PULM coarse throughout CV RRR  ABD: soft, nontender EXT no LE edema ACCESS PD cath intact. C/d/I  Assessment/Plan: 1 Acute hypoxic RF: recurrent PNA + some component of volume overload.  Being treated with vanc/ cefepime--> Unasyn  Discussion with pt's family- will use midodrine to augment BP, use all 2.5% to pull fluid off, add extra exchange  tonight to aid in volume removal.  They asked about transitioning to HD--> would like to optimize PD first because I dont' think he's had PD failure especially in light of his machine malfunctions.  Also- unclear since he's pretty frail if he would tolerate.   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 Metabolic Bone Disease:  renal vitamin with fluid restriction 6.  NSTEMI: s/p LHC 1/10, on ASA/ Plavix 7.  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  Getting MRI/MRA, carotid US  Gerritt Galentine 03/23/2022, 12:22 PM

## 2022-03-23 NOTE — Evaluation (Signed)
Physical Therapy Evaluation Patient Details Name: William [name] MRN: [id number] DOB: November 11, 1940 Today's Date: 03/23/2022  History of Present Illness  William [name] is a 82 y.o. male with admitted 1/18 with acute hypoxic respiratory failure, possible uremic encephalopathy, CHF and pneumonia. 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 PMHx of ESRD on peritoneal dialysis, hypertension,  hypothyroidism, hyperlipidemia, CAD with recent non-STEMI 03/04/2022 s/p PTCA and DES to mid LAD 07/05/8880, systolic and diastolic heart failure  Clinical Impression  Pt admitted with above diagnosis. Independent at baseline, owns a contracting company, gradual decline over the past week per family. Pt currently requires close guard for mobility and verbal cues for safe mobility out of bed. Declined to ambulate due to fatigue after transfer training today. SpO2 96% on 3L when PT entered room, down to 92% on RA throughout session. RR in low 30s. HR in 80s. Strong family support. Family feels they can provide adequate supervision at current functional level and agreeable to HHPT at d/c. Disoriented at start of session, reoriented and recalled some information provided earlier in evaluation at the end of session. Pt currently with functional limitations due to the deficits listed below (see PT Problem List). Pt will benefit from skilled PT to increase their independence and safety with mobility to allow discharge to the venue listed below.          Recommendations for follow up therapy are one component of a multi-disciplinary discharge planning process, led by the attending physician.  Recommendations may be updated based on patient status, additional functional criteria and insurance authorization.  Follow Up Recommendations Home health PT      Assistance Recommended at Discharge Frequent or constant Supervision/Assistance  Patient can return home with the following  A little help with walking and/or  transfers;A little help with bathing/dressing/bathroom;Assistance with cooking/housework;Assist for transportation;Help with stairs or ramp for entrance;Direct supervision/assist for medications management;Direct supervision/assist for financial management    Equipment Recommendations None recommended by PT  Recommendations for Other Services       Functional Status Assessment Patient has had a recent decline in their functional status and demonstrates the ability to make significant improvements in function in a reasonable and predictable amount of time.     Precautions / Restrictions Precautions Precautions: Fall Restrictions Weight Bearing Restrictions: No      Mobility  Bed Mobility Overal bed mobility: Needs Assistance Bed Mobility: Supine to Sit     Supine to sit: Min guard     General bed mobility comments: min guard for safety. VC for sequencing and to initiate    Transfers Overall transfer level: Needs assistance Equipment used: Rolling walker (2 wheels) Transfers: Sit to/from Stand, Bed to chair/wheelchair/BSC Sit to Stand: Min guard   Step pivot transfers: Min guard       General transfer comment: Min guard for safety. Quickly rises. Minor instability improved with light support from RW. Able to take steps to chair with cues for sequencing and RW close to proximity, no evidence of buckling or overt LOB with device.    Ambulation/Gait               General Gait Details: Too fatigued  Stairs            Wheelchair Mobility    Modified Rankin (Stroke Patients Only) Modified Rankin (Stroke Patients Only) Pre-Morbid Rankin Score: No symptoms Modified Rankin: Moderately severe disability     Balance Overall balance assessment: Mild deficits observed, not formally tested  Pertinent Vitals/Pain Pain Assessment Pain Assessment: No/denies pain    Home Living Family/patient expects  to be discharged to:: Private residence Living Arrangements: Spouse/significant other;Children Available Help at Discharge: Available 24 hours/day Type of Home: House Home Access: Stairs to enter Entrance Stairs-Rails: None Entrance Stairs-Number of Steps: 2 (1 small step through sun room)   Home Layout: One level Home Equipment: Conservation officer, nature (2 wheels);Cane - single point      Prior Function Prior Level of Function : Independent/Modified Independent;Working/employed;Driving             Mobility Comments: Environmental manager Dominance   Dominant Hand: Left    Extremity/Trunk Assessment   Upper Extremity Assessment Upper Extremity Assessment: Defer to OT evaluation    Lower Extremity Assessment Lower Extremity Assessment: Generalized weakness;Difficult to assess due to impaired cognition (Possible slight weakness LLE compared to Rt with hip flexion)    Cervical / Trunk Assessment Cervical / Trunk Assessment: Normal  Communication   Communication: HOH  Cognition Arousal/Alertness: Awake/alert Behavior During Therapy: WFL for tasks assessed/performed Overall Cognitive Status: Impaired/Different from baseline                                          General Comments General comments (skin integrity, edema, etc.): SpO2 93% on RA during session, 96% on 3L. RR low 30s throughout. HR in 80s.    Exercises     Assessment/Plan    PT Assessment Patient needs continued PT services  PT Problem List Decreased strength;Decreased activity tolerance;Decreased balance;Decreased mobility;Decreased cognition;Decreased knowledge of use of DME;Decreased safety awareness;Decreased knowledge of precautions;Cardiopulmonary status limiting activity       PT Treatment Interventions Gait training;DME instruction;Stair training;Functional mobility training;Therapeutic activities;Therapeutic exercise;Balance training;Neuromuscular  re-education;Cognitive remediation;Patient/family education    PT Goals (Current goals can be found in the Care Plan section)  Acute Rehab PT Goals Patient Stated Goal: Go home PT Goal Formulation: With patient/family Time For Goal Achievement: 04/06/22 Potential to Achieve Goals: Good    Frequency Min 4X/week     Co-evaluation               AM-PAC PT "6 Clicks" Mobility  Outcome Measure Help needed turning from your back to your side while in a flat bed without using bedrails?: A Little Help needed moving from lying on your back to sitting on the side of a flat bed without using bedrails?: A Little Help needed moving to and from a bed to a chair (including a wheelchair)?: A Little Help needed standing up from a chair using your arms (e.g., wheelchair or bedside chair)?: A Little Help needed to walk in hospital room?: A Little Help needed climbing 3-5 steps with a railing? : A Little 6 Click Score: 18    End of Session         PT Visit Diagnosis: Unsteadiness on feet (R26.81);Muscle weakness (generalized) (M62.81);Difficulty in walking, not elsewhere classified (R26.2);Other symptoms and signs involving the nervous system (R29.898)    Time: 1914-7829 PT Time Calculation (min) (ACUTE ONLY): 39 min   Charges:   PT Evaluation $PT Eval Low Complexity: 1 Low PT Treatments $Therapeutic Activity: 23-37 mins        Candie Mile, PT, DPT Physical Therapist [facility] [place]  Ellouise Newer 03/23/2022, 4:22 PM

## 2022-03-23 NOTE — Evaluation (Signed)
Clinical/Bedside Swallow Evaluation Patient Details  Name: William [name] MRN: [id number] Date of Birth: 08-12-1940  Today's Date: 03/23/2022 Time: SLP Start Time (ACUTE ONLY): 3244 SLP Stop Time (ACUTE ONLY): 1025 SLP Time Calculation (min) (ACUTE ONLY): 29 min  Past Medical History:  Past Medical History:  Diagnosis Date   Aortic insufficiency    Coronary artery disease    ESRD on peritoneal dialysis ([facility])    Hyperlipidemia    Hypertension    Past Surgical History:  Past Surgical History:  Procedure Laterality Date   APPENDECTOMY     CARDIOVASCULAR STRESS TEST  06/08/2009   EF 43%   CHOLECYSTECTOMY     CORONARY STENT INTERVENTION N/A 03/14/2022   Procedure: CORONARY STENT INTERVENTION;  Surgeon: Troy Sine, MD;  Location: [facility];  Service: Cardiovascular;  Laterality: N/A;   FOOT SURGERY     HERNIA REPAIR     LEFT HEART CATH AND CORONARY ANGIOGRAPHY N/A 03/12/2022   Procedure: LEFT HEART CATH AND CORONARY ANGIOGRAPHY;  Surgeon: Nelva Bush, MD;  Location: [facility];  Service: Cardiovascular;  Laterality: N/A;   US ECHOCARDIOGRAPHY  05/24/2009   EF 60%   HPI:  Pt is an 82 year old male presenting with low blood pressure, SpO2 in the high 80s, confusion, and generalized weakness. Suspected bilateral multifocal PNA and severe sepsis. Head CT 1/19 reveals subacute occipital CVA. Swallow eval during recent admission was concerning for esophageal component. PMH includes PNA, reflux, GERD, combined CHF, ESRD on PD, HTN, HLD and BPH.    Assessment / Plan / Recommendation  Clinical Impression  Per bedside swallow 1/11, pt's daughter reports n/v 2-3x/week, but granddaughter states no recurrences. Pt reports history of reflux particularly after swallowing large pills. During PO trials, no overt signs of aspiration were noted and pt reported no prior trouble with swallowing. He did however have throat clearing and coughing that started several minutes after POs  were finished. Question esophageal function's impact on potential aspiration risk. SLP reviewed aspiration and esophageal precautions and educated pt and family on importance of sitting upright at meals and remaining upright for 30 minutes following. Recommend regular diet with thin liquids and pills crushed in purees to ease any esophageal impact. Findings also reviewed with MD. SLP Visit Diagnosis: Dysphagia, unspecified (R13.10)    Aspiration Risk  Mild aspiration risk    Diet Recommendation Regular;Thin liquid   Liquid Administration via: Cup;Straw Medication Administration: Crushed with puree Supervision: Patient able to self feed;Intermittent supervision to cue for compensatory strategies Compensations: Small sips/bites;Slow rate;Follow solids with liquid Postural Changes: Seated upright at 90 degrees;Remain upright for at least 30 minutes after po intake    Other  Recommendations Recommended Consults: Consider GI evaluation;Consider esophageal assessment Oral Care Recommendations: Oral care BID    Recommendations for follow up therapy are one component of a multi-disciplinary discharge planning process, led by the attending physician.  Recommendations may be updated based on patient status, additional functional criteria and insurance authorization.  Follow up Recommendations No SLP follow up      Assistance Recommended at Discharge    Functional Status Assessment Patient has had a recent decline in their functional status and/or demonstrates limited ability to make significant improvements in function in a reasonable and predictable amount of time  Frequency and Duration min 1 x/week  1 week       Prognosis Prognosis for Safe Diet Advancement: Good Barriers to Reach Goals: Severity of deficits      Swallow  Study   General HPI: Pt is an 82 year old male presenting with low blood pressure, SpO2 in the high 80s, confusion, and generalized weakness. Suspected bilateral  multifocal PNA and severe sepsis. Head CT 1/19 reveals subacute occipital CVA. Swallow eval during recent admission was concerning for esophageal component. PMH includes PNA, reflux, GERD, combined CHF, ESRD on PD, HTN, HLD and BPH. Type of Study: Bedside Swallow Evaluation Previous Swallow Assessment: see HPI Diet Prior to this Study: NPO Temperature Spikes Noted: No Respiratory Status: Room air History of Recent Intubation: No Behavior/Cognition: Cooperative;Alert Oral Cavity Assessment: Within Functional Limits Oral Care Completed by SLP: No Oral Cavity - Dentition: Adequate natural dentition Vision: Functional for self-feeding Self-Feeding Abilities: Able to feed self;Needs set up Patient Positioning: Upright in bed Baseline Vocal Quality: Normal Volitional Cough: Weak (mild) Volitional Swallow: Able to elicit    Oral/Motor/Sensory Function Overall Oral Motor/Sensory Function: Mild impairment (some mild generalized weakness noted in lips and tongue) Facial ROM: Within Functional Limits Facial Symmetry: Within Functional Limits Facial Strength: Within Functional Limits Lingual ROM: Within Functional Limits Lingual Symmetry: Within Functional Limits Lingual Strength: Reduced   Ice Chips Ice chips: Not tested   Thin Liquid Thin Liquid: Within functional limits Presentation: Cup;Straw    Nectar Thick Nectar Thick Liquid: Not tested   Honey Thick Honey Thick Liquid: Not tested   Puree Puree: Within functional limits Presentation: Self Fed;Spoon   Solid     Solid: Within functional limits Presentation: [place] M., Student SLP 03/23/2022,12:25 PM

## 2022-03-24 ENCOUNTER — Inpatient Hospital Stay (HOSPITAL_COMMUNITY)
Admit: 2022-03-24 | Discharge: 2022-03-24 | Disposition: A | Discharge status: Home / Self Care | Payer: PPO | Attending: Internal Medicine | Admitting: Internal Medicine

## 2022-03-24 ENCOUNTER — Inpatient Hospital Stay (HOSPITAL_COMMUNITY): Payer: PPO

## 2022-03-24 DIAGNOSIS — I82462 Acute embolism and thrombosis of left calf muscular vein: Secondary | ICD-10-CM

## 2022-03-24 DIAGNOSIS — I214 Non-ST elevation (NSTEMI) myocardial infarction: Secondary | ICD-10-CM | POA: Diagnosis not present

## 2022-03-24 DIAGNOSIS — I639 Cerebral infarction, unspecified: Secondary | ICD-10-CM | POA: Diagnosis not present

## 2022-03-24 DIAGNOSIS — I82432 Acute embolism and thrombosis of left popliteal vein: Secondary | ICD-10-CM | POA: Diagnosis not present

## 2022-03-24 DIAGNOSIS — I63432 Cerebral infarction due to embolism of left posterior cerebral artery: Secondary | ICD-10-CM

## 2022-03-24 DIAGNOSIS — J9601 Acute respiratory failure with hypoxia: Secondary | ICD-10-CM | POA: Diagnosis not present

## 2022-03-24 DIAGNOSIS — I5043 Acute on chronic combined systolic (congestive) and diastolic (congestive) heart failure: Secondary | ICD-10-CM | POA: Diagnosis not present

## 2022-03-24 DIAGNOSIS — I2510 Atherosclerotic heart disease of native coronary artery without angina pectoris: Secondary | ICD-10-CM | POA: Diagnosis not present

## 2022-03-24 LAB — CBC WITH DIFFERENTIAL/PLATELET
Abs Immature Granulocytes: 0.03 10*3/uL (ref 0.00–0.07)
Basophils Absolute: 0.1 10*3/uL (ref 0.0–0.1)
Basophils Relative: 1 %
Eosinophils Absolute: 0.1 10*3/uL (ref 0.0–0.5)
Eosinophils Relative: 2 %
HCT: 29.2 % — ABNORMAL LOW (ref 39.0–52.0)
Hemoglobin: 9.3 g/dL — ABNORMAL LOW (ref 13.0–17.0)
Immature Granulocytes: 0 %
Lymphocytes Relative: 6 %
Lymphs Abs: 0.4 10*3/uL — ABNORMAL LOW (ref 0.7–4.0)
MCH: 33.6 pg (ref 26.0–34.0)
MCHC: 31.8 g/dL (ref 30.0–36.0)
MCV: 105.4 fL — ABNORMAL HIGH (ref 80.0–100.0)
Monocytes Absolute: 0.8 10*3/uL (ref 0.1–1.0)
Monocytes Relative: 11 %
Neutro Abs: 5.9 10*3/uL (ref 1.7–7.7)
Neutrophils Relative %: 80 %
Platelets: 223 10*3/uL (ref 150–400)
RBC: 2.77 MIL/uL — ABNORMAL LOW (ref 4.22–5.81)
RDW: 14.7 % (ref 11.5–15.5)
WBC: 7.3 10*3/uL (ref 4.0–10.5)
nRBC: 0 % (ref 0.0–0.2)

## 2022-03-24 LAB — ECHOCARDIOGRAM LIMITED BUBBLE STUDY: S' Lateral: 6.5 cm

## 2022-03-24 LAB — BASIC METABOLIC PANEL
Anion gap: 20 — ABNORMAL HIGH (ref 5–15)
BUN: 86 mg/dL — ABNORMAL HIGH (ref 8–23)
CO2: 22 mmol/L (ref 22–32)
Calcium: 7.7 mg/dL — ABNORMAL LOW (ref 8.9–10.3)
Chloride: 96 mmol/L — ABNORMAL LOW (ref 98–111)
Creatinine, Ser: 9.23 mg/dL — ABNORMAL HIGH (ref 0.61–1.24)
GFR, Estimated: 5 mL/min — ABNORMAL LOW (ref >60)
Glucose, Bld: 128 mg/dL — ABNORMAL HIGH (ref 70–99)
Potassium: 4.4 mmol/L (ref 3.5–5.1)
Sodium: 138 mmol/L (ref 135–145)

## 2022-03-24 LAB — BRAIN NATRIURETIC PEPTIDE: B Natriuretic Peptide: >4500 pg/mL — ABNORMAL HIGH (ref 0.0–100.0)

## 2022-03-24 LAB — PROCALCITONIN: Procalcitonin: 0.7 ng/mL

## 2022-03-24 LAB — MAGNESIUM: Magnesium: 2.2 mg/dL (ref 1.7–2.4)

## 2022-03-24 LAB — C-REACTIVE PROTEIN: CRP: 22.1 mg/dL — ABNORMAL HIGH (ref <1.0)

## 2022-03-24 MED ORDER — APIXABAN 5 MG PO TABS: 5.0000 mg | ORAL_TABLET | Freq: Two times a day (BID) | ORAL | Status: DC

## 2022-03-24 MED ORDER — DOCUSATE SODIUM 100 MG PO CAPS
100.0000 mg | ORAL_CAPSULE | Freq: Every day | ORAL | Status: DC
  Administered 2022-03-24 – 2022-03-26 (×3): 100 mg via ORAL
  Filled 2022-03-24 (×5): qty 1

## 2022-03-24 MED ORDER — POLYETHYLENE GLYCOL 3350 17 G PO PACK
17.0000 g | PACK | Freq: Every day | ORAL | Status: DC
  Administered 2022-03-24 – 2022-03-28 (×5): 17 g via ORAL
  Filled 2022-03-24 (×5): qty 1

## 2022-03-24 MED ORDER — APIXABAN 5 MG PO TABS
5.0000 mg | ORAL_TABLET | Freq: Two times a day (BID) | ORAL | Status: DC
  Administered 2022-03-24 – 2022-03-26 (×5): 5 mg via ORAL
  Filled 2022-03-24 (×5): qty 1

## 2022-03-24 MED ORDER — APIXABAN 5 MG PO TABS: 10.0000 mg | ORAL_TABLET | Freq: Two times a day (BID) | ORAL | Status: DC

## 2022-03-24 NOTE — Progress Notes (Signed)
Rounding Note    Patient Name: William [name] Date of Encounter: 03/24/2022  [place] Cardiologist: Janina Mayo, MD   Subjective   Found to have left lower extremity DVT.  Oriented to person only.  Denies any chest pain  Inpatient Medications    Scheduled Meds:  atorvastatin  20 mg Oral Daily   clopidogrel  75 mg Oral Daily   docusate sodium  100 mg Oral Daily   ezetimibe  10 mg Oral Daily   feeding supplement  237 mL Oral BID BM   gentamicin cream  1 Application Topical Daily   metoprolol tartrate  25 mg Oral BID   midodrine  5 mg Oral TID WC   multivitamin  1 tablet Oral QHS   pantoprazole  40 mg Oral Daily   polyethylene glycol  17 g Oral Daily   sevelamer carbonate  800 mg Oral TID WC   Continuous Infusions:  ampicillin-sulbactam (UNASYN) IV 3 g (03/23/22 1509)   dialysis solution 2.5% low-MG/low-CA     PRN Meds: acetaminophen **OR** acetaminophen, LORazepam   Vital Signs    Vitals:   03/23/22 2006 03/24/22 0000 03/24/22 0400 03/24/22 0900  BP: 107/81 113/67 114/76   Pulse: 76 77 75   Resp: (!) 27 19 (!) 29   Temp: 98.2 F (36.8 C) 98 F (36.7 C) 97.9 F (36.6 C)   TempSrc: Axillary Axillary Axillary   SpO2: 98% 98% 95%   Weight:    82.1 kg  Height:        Intake/Output Summary (Last 24 hours) at 03/24/2022 1050 Last data filed at 03/24/2022 0930 Gross per 24 hour  Intake --  Output 65 ml  Net -65 ml      03/24/2022    9:00 AM 03/22/2022    3:57 PM 03/13/2022   10:44 AM  Last 3 Weights  Weight (lbs) 181 lb 185 lb 167 lb 15.9 oz  Weight (kg) 82.1 kg 83.915 kg 76.2 kg      Telemetry    NSR - Personally Reviewed  ECG    No new EKG - Personally Reviewed  Physical Exam   GEN: No acute distress.   Neck: +JVD Cardiac: RRR, no murmurs Respiratory: Bibasilar crackles GI: Soft, nontender MS: No edema Neuro:  Nonfocal  Psych: Normal affect   Labs    High Sensitivity Troponin:   Recent Labs  Lab 03/08/22 0917  03/08/22 1520 03/08/22 1850 03/22/22 1759 03/22/22 2341  TROPONINIHS 2,560* 1,534* 1,415* 153* 143*     Chemistry Recent Labs  Lab 03/22/22 1557 03/22/22 1605 03/23/22 0650 03/24/22 0554  NA 135 132* 136 138  K 4.7 4.7 4.9 4.4  CL 94*  --  95* 96*  CO2 25  --  22 22  GLUCOSE 169*  --  109* 128*  BUN 76*  --  84* 86*  CREATININE 8.52*  --  9.44* 9.23*  CALCIUM 7.3*  --  7.3* 7.7*  MG  --   --  2.1 2.2  PROT 6.3*  --   --   --   ALBUMIN 2.6*  --   --   --   AST 34  --   --   --   ALT 42  --   --   --   ALKPHOS 70  --   --   --   BILITOT 0.7  --   --   --   GFRNONAA 6*  --  5* 5*  ANIONGAP  16*  --  19* 20*    Lipids  Recent Labs  Lab 03/23/22 0649  CHOL 86  TRIG 85  HDL 37*  LDLCALC 32  CHOLHDL 2.3    Hematology Recent Labs  Lab 03/22/22 1557 03/22/22 1605 03/23/22 0650 03/24/22 0554  WBC 12.4*  --  10.6* 7.3  RBC 2.92*  --  2.69* 2.77*  HGB 9.8* 10.9* 9.0* 9.3*  HCT 30.7* 32.0* 28.1* 29.2*  MCV 105.1*  --  104.5* 105.4*  MCH 33.6  --  33.5 33.6  MCHC 31.9  --  32.0 31.8  RDW 15.0  --  14.8 14.7  PLT 199  --  189 223   Thyroid  Recent Labs  Lab 03/23/22 0650  TSH 1.029    BNP Recent Labs  Lab 03/23/22 0650  BNP >4,500.0*    DDimer No results for input(s): "DDIMER" in the last 168 hours.   Radiology    MR BRAIN WO CONTRAST  Result Date: 03/23/2022 CLINICAL DATA:  Neuro deficit, acute, stroke suspected.  Weakness. EXAM: MRI HEAD WITHOUT CONTRAST TECHNIQUE: Multiplanar, multiecho pulse sequences of the brain and surrounding structures were obtained without intravenous contrast was attempted. Only diffusion images were acquired due to patient's inability to tolerate the exam. COMPARISON:  Head CT 03/23/2022. FINDINGS: Brain: Acute infarct in the medial left occipital lobe extending into the tail of the left hippocampus and posterior aspect of the left parahippocampal gyrus. No other areas of abnormal restricted diffusion. Vascular: No abnormal  diffusion signal. Skull and upper cervical spine: No abnormal diffusion signal. Sinuses/Orbits: No abnormal diffusion signal. Other: None. IMPRESSION: Only diffusion images were acquired due to the patient's inability to tolerate the exam. 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 No other areas of restricted diffusion. Electronically Signed   By: Emmit Alexanders M.D.   On: 03/23/2022 14:04   CT HEAD WO CONTRAST (5MM)  Result Date: 03/23/2022 CLINICAL DATA:  Delirium EXAM: CT HEAD WITHOUT CONTRAST TECHNIQUE: Contiguous axial images were obtained from the base of the skull through the vertex without intravenous contrast. RADIATION DOSE REDUCTION: This exam was performed according to the departmental dose-optimization program which includes automated exposure control, adjustment of the mA and/or kV according to patient size and/or use of iterative reconstruction technique. COMPARISON:  CT head 01/21/2019 FINDINGS: Brain: There is hypodensity in the left occipital lobe suspicious for subacute infarct. There is possible trace petechial hemorrhage (3-15) but no mass effect. There is no acute intracranial hemorrhage or extra-axial fluid collection. Parenchymal volume is within expected limits for age. The ventricles are stable in size. Patchy and confluent hypodensity throughout the remainder of the supratentorial white matter likely reflects sequela of chronic small-vessel ischemic change. There is a small remote infarct in the right cerebellar hemisphere. The pituitary and suprasellar region are normal. There is no mass lesion. There is no mass effect or midline shift. Vascular: There is calcification of the bilateral carotid siphons and vertebral arteries. Skull: Normal. Negative for fracture or focal lesion. Sinuses/Orbits: The imaged paranasal sinuses are clear. Bilateral lens implants are in place. The globes and orbits are otherwise unremarkable. Other: None. IMPRESSION: Suspected subacute infarct in the left  occipital lobe with possible petechial hemorrhage but no mass effect. Recommend brain MRI for better evaluation. These results will be called to the ordering clinician or representative by the Radiologist Assistant, and communication documented in the PACS or [employer]. Electronically Signed   By: Valetta Mole M.D.   On: 03/23/2022 09:38  DG Chest Port 1 View  Result Date: 03/23/2022 CLINICAL DATA:  Shortness of breath.  Concern for pneumonia. EXAM: PORTABLE CHEST 1 VIEW COMPARISON:  03/22/2022 FINDINGS: Unchanged cardiac enlargement. Decreased left pleural effusion. Diffuse bilateral interstitial and airspace opacities are again noted. Compared with the previous exam there is been improved aeration to both lower lung zones. IMPRESSION: 1. Persistent bilateral interstitial and airspace opacities with improved aeration to both lower lung zones. 2. Decrease in left pleural effusion. Electronically Signed   By: Kerby Moors M.D.   On: 03/23/2022 06:29   DG Chest Portable 1 View  Result Date: 03/22/2022 CLINICAL DATA:  Lethargic.  Decreasing blood pressure EXAM: PORTABLE CHEST 1 VIEW COMPARISON:  03/15/2022 x-ray and older FINDINGS: Enlarged cardiopericardial silhouette with increasing edema and perihilar opacities. Small effusions, left-greater-than-right. No pneumothorax. Tortuous and ectatic aorta. Overlapping cardiac leads. IMPRESSION: Developing bilateral opacities with effusions, possible edema. Secondary infiltrate is possible. No pneumothorax. Enlarged cardiopericardial silhouette. Electronically Signed   By: Jill Side M.D.   On: 03/22/2022 16:20    Cardiac Studies     Patient Profile     82 y.o. male with history of ESRD on PD, CAD status post NSTEMI 03/04/2022 with DES to LAD, chronic combined systolic and diastolic heart failure we are consulted for evaluation of heart failure.  Admitted 02/2922 3 03/15/2022 with acute hypoxic respiratory failure due to multifocal pneumonia,  requiring mechanical ventilation.  Found to have NSTEMI during admission.  Echocardiogram 03/04/2022 showed EF 40 to 45%.  LHC on 03/12/2022 showed multivessel CAD with 80 to 90% stenosis of mid LAD, moderate to severe disease of D1, RPDA, distal LCx, R PLA B.  Underwent DES to mid LAD.  Now presents with generalized weakness and poor p.o. intake and confusion since recent discharge.  Assessment & Plan    Acute combined systolic and diastolic heart failure: Echocardiogram 03/04/2022 with EF 40 to 45%.  BNP greater than 4500 on admission.  Chest x-ray with bilateral opacities with effusions, edema versus infiltrate.  EKG without acute changes. -Volume management per nephrology, on dialysis -GDMT limited by blood pressure and renal function.  Recommend stopping metoprolol in setting of starting midodrine for low BP  CAD: Status post DES to LAD 03/14/2022.  Troponin elevated at 153 > 143, likely demand ischemia in setting of acute illness.   -Continue Lipitor, Zetia -On aspirin and Plavix given recent stent.  However in the setting of starting anticoagulation for acute DVT, recommend stopping aspirin.  Continue Plavix plus Eliquis moving forward  Acute left lower extremity DVT: Started on Eliquis  Acute hypoxic respiratory failure: Likely multifactorial in setting of recurrent pneumonia and volume overload.  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  Head CT did show petechial hemorrhage but okay to continue DAPT given recent DES placement per neurology.  Goals of care: Agree with palliative consult  For questions or updates, please contact [place] Please consult [email or web address] for contact info under        Signed, Donato Heinz, MD  03/24/2022, 10:50 AM

## 2022-03-24 NOTE — Progress Notes (Addendum)
Subjective:  Overnight:NAEON  Patient resting in bed comfortably.  Denying any acute concerns.  Slight confusion noted. Initially stated he was home but then on repeat questioning stated he was in the hospital.   Interval update: pt positive for DVT in left lower extremity (see problem below).  Objective:  Vital signs in last 24 hours: Vitals:   03/23/22 1319 03/23/22 2006 03/24/22 0000 03/24/22 0400  BP: 106/68 107/81 113/67 114/76  Pulse:  76 77 75  Resp:  (!) 27 19 (!) 29  Temp: 98 F (36.7 C) 98.2 F (36.8 C) 98 F (36.7 C) 97.9 F (36.6 C)  TempSrc: Axillary Axillary Axillary Axillary  SpO2:  98% 98% 95%  Weight:      Height:       Supplemental O2: Nasal Cannula Last BM Date :  (PTA) SpO2: 95 % O2 Flow Rate (L/min): 4 L/min Filed Weights   03/22/22 1557  Weight: 83.9 kg   No intake or output data in the 24 hours ending 03/24/22 0622 Net IO Since Admission: 616.93 mL [03/24/22 0622] Physical Exam General: ill appearing elderly male HENT: NCAT Lungs: CTAB on anterior auscultation Cardiovascular: NSR, good pulses in extremities Abdomen: No TTP, normal bowel sounds MSK: Moving all extremities.  4 out of 5 strength in the bilateral lower extremities. Neuro: CN II-XII grossly intact.  No focal deficits noted on exam.  Patient alert but oriented to self.  Improvement in orientation during exam noted. Psych: Normal mood and normal affect  Diagnostics    Latest Ref Rng & Units 03/23/2022    6:50 AM 03/22/2022    4:05 PM 03/22/2022    3:57 PM  CBC  WBC 4.0 - 10.5 K/uL 10.6   12.4   Hemoglobin 13.0 - 17.0 g/dL 9.0  10.9  9.8   Hematocrit 39.0 - 52.0 % 28.1  32.0  30.7   Platelets 150 - 400 K/uL 189   199        Latest Ref Rng & Units 03/23/2022    6:50 AM 03/22/2022    4:05 PM 03/22/2022    3:57 PM  CMP  Glucose 70 - 99 mg/dL 109   169   BUN 8 - 23 mg/dL 84   76   Creatinine 0.61 - 1.24 mg/dL 9.44   8.52   Sodium 135 - 145 mmol/L 136  132  135   Potassium  3.5 - 5.1 mmol/L 4.9  4.7  4.7   Chloride 98 - 111 mmol/L 95   94   CO2 22 - 32 mmol/L 22   25   Calcium 8.9 - 10.3 mg/dL 7.3   7.3   Total Protein 6.5 - 8.1 g/dL   6.3   Total Bilirubin 0.3 - 1.2 mg/dL   0.7   Alkaline Phos 38 - 126 U/L   70   AST 15 - 41 U/L   34   ALT 0 - 44 U/L   42     Procalcitonin 0.70 CRP 22.1 Magnesium 2.2   MR BRAIN WO CONTRAST  Result Date: 03/23/2022 IMPRESSION: Only diffusion images were acquired due to the patient's inability to tolerate the exam. 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 No other areas of restricted diffusion. Electronically Signed   By: Emmit Alexanders M.D.   On: 03/23/2022 14:04   CT HEAD WO CONTRAST (5MM)  Result Date: 03/23/2022 IMPRESSION: Suspected subacute infarct in the left occipital lobe with possible petechial hemorrhage but no mass effect. Recommend brain  MRI for better evaluation. These results will be called to the ordering clinician or representative by the Radiologist Assistant, and communication documented in the PACS or [employer]. Electronically Signed   By: Valetta Mole M.D.   On: 03/23/2022 09:38   DG Chest Port 1 View  Result Date: 03/23/2022 CLINICAL DATA:  Shortness of breath.  Concern for pneumonia. EXAM: PORTABLE CHEST 1 VIEW COMPARISON:  03/22/2022 FINDINGS: Unchanged cardiac enlargement. Decreased left pleural effusion. Diffuse bilateral interstitial and airspace opacities are again noted. Compared with the previous exam there is been improved aeration to both lower lung zones. IMPRESSION: 1. Persistent bilateral interstitial and airspace opacities with improved aeration to both lower lung zones. 2. Decrease in left pleural effusion. Electronically Signed   By: Kerby Moors M.D.   On: 03/23/2022 06:29   Assessment/Plan: William [name] is a 82 y.o. male with medical history significant of chronic HFrEF, ESRD on PD, hypertension, hyperlipidemia, BPH recently admitted 12/29-1/11 for acute hypoxic respiratory  failure secondary to multifocal pneumonia and was intubated 12/29-1/1 presented to ED with AMS found to have PNA with HF exacerbation  and acute stroke.    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 Secondary to combined pneumonia and HF exacerbation. Procal elevated. Pt on Unasyn day 2. Plan to continue abx. Pt got lasix yesterday and plan to get PD while here to remove fluid. BNP initially >4.5k. Repeat pending.   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  Recent history of left heart cath showing multivessel CAD requiring DES to mid LAD on 03/14/2022.  Appears to be in CHF again, for now continue DAPT blocker along with statin and Zetia, fluid removal via PD, dose of Lasix on 03/23/2022.  Cardiology consulted and recommend continue current treatment plan.   Metabolic encephalopathy.  Due to combination of acute hypoxic respiratory failure, CHF and pneumonia.  Supportive care.  He has had issues with delirium during his last hospital stay as well requiring Precedex drip, minimize benzodiazepines narcotics, as needed IM Haldol, nighttime Seroquel and monitor.   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 MRI shows acute stroke in the left PCA territory. Unable to obtain MRA of the neck yesterday due to encephalopathy. Pt already on DAPT due to recent stent placement. Recent echo shows slight decease in EF at 45 % with G1DD. A1c normal on 03/14/22. Neurology  following and plan to attempt further imaging if mental status allows. Later report of pt being positive for DVT. Discussed with Dr. Gardiner Rhyme and Dr. Erlinda Hong regarding the next for DAPT and [facility]. Dr. Gardiner Rhyme recommends plavix and [facility]. He plans on seeing patient later today. Plan to start Eliquis, continue plavix and discontinue aspirin on 03/24/2022. Will also get echo with bubble study.    ESRD on home PD.  Appears to have fluid overload.  Nephrology consulted and recommend starting midodrine 5 mg TID. They are planning for extra fluid removal overnight with PD. Will hold  off on HD as pt is poor candidate and PD is working. Pt challenged with Lasix on 03/23/2022 but unfortunately no output recorded.    Dyslipidemia.  Chronic. LDL 32. Continue statin and Zetia.   Hypertension.  Chronic. Currently on beta-blocker.   Chronic macrocytic anemia.  B12, TSH stable.  PCP to monitor outpatient.   GERD - PPI   Diet: Normal IVF: None,None VTE: Heparin Code: DNR PT/OT recs: Home Health,  Prior to Admission Living Arrangement: Home Anticipated Discharge Location: Home Barriers to Discharge: Medical workup  Dispo: Anticipated discharge in approximately 1-2 day(s).   Idamae Schuller, MD Tillie Rung. [facility] Internal Medicine Residency, PGY-2  Pager: [phone] After 5 pm and on weekends: Please call the on-call pager    Addendum -  I have directly reviewed the clinical findings, lab results and imaging studies. I have interviewed and examined the patient and agree with the documentation and management as recorded by the Dr Humphrey Rolls, he was admitted here for CHF and pneumonia also found out to have a small acute stroke.  Neurology following along with cardiology.  Discussed with patient's wife who is the primary decision maker continue all medical treatment as appropriate along with investigation and tests.  Patient and wife are the main decision makers.

## 2022-03-24 NOTE — Progress Notes (Signed)
Bilateral lower extremity venous duplex has been completed. Preliminary results can be found in CV Proc through chart review.  Results were given to Dr. Candiss Norse.  03/24/22 10:22 AM William [name] RVT

## 2022-03-24 NOTE — Progress Notes (Signed)
Carotid duplex bilateral and TCD w/ bubble study completed.   Please see CV Proc for preliminary results.   Darlin Coco, RDMS, RVT

## 2022-03-24 NOTE — Plan of Care (Signed)
  Problem: Activity: Goal: Ability to tolerate increased activity will improve Outcome: Progressing   Problem: Respiratory: Goal: Ability to maintain a clear airway and adequate ventilation will improve Outcome: Progressing   Problem: Role Relationship: Goal: Method of communication will improve Outcome: Progressing   Problem: Education: Goal: Understanding of CV disease, CV risk reduction, and recovery process will improve Outcome: Progressing Goal: Individualized Educational Video(s) Outcome: Progressing   Problem: Activity: Goal: Ability to return to baseline activity level will improve Outcome: Progressing   Problem: Cardiovascular: Goal: Ability to achieve and maintain adequate cardiovascular perfusion will improve Outcome: Progressing Goal: Vascular access site(s) Level 0-1 will be maintained Outcome: Progressing   Problem: Health Behavior/Discharge Planning: Goal: Ability to safely manage health-related needs after discharge will improve Outcome: Progressing   Problem: Education: Goal: Knowledge of General Education information will improve Description: Including pain rating scale, medication(s)/side effects and non-pharmacologic comfort measures Outcome: Progressing   Problem: Health Behavior/Discharge Planning: Goal: Ability to manage health-related needs will improve Outcome: Progressing   Problem: Clinical Measurements: Goal: Ability to maintain clinical measurements within normal limits will improve Outcome: Progressing Goal: Will remain free from infection Outcome: Progressing Goal: Diagnostic test results will improve Outcome: Progressing Goal: Respiratory complications will improve Outcome: Progressing Goal: Cardiovascular complication will be avoided Outcome: Progressing   Problem: Activity: Goal: Risk for activity intolerance will decrease Outcome: Progressing   Problem: Nutrition: Goal: Adequate nutrition will be maintained Outcome:  Progressing   Problem: Coping: Goal: Level of anxiety will decrease Outcome: Progressing   Problem: Elimination: Goal: Will not experience complications related to bowel motility Outcome: Progressing Goal: Will not experience complications related to urinary retention Outcome: Progressing   Problem: Pain Managment: Goal: General experience of comfort will improve Outcome: Progressing   Problem: Safety: Goal: Ability to remain free from injury will improve Outcome: Progressing   Problem: Skin Integrity: Goal: Risk for impaired skin integrity will decrease Outcome: Progressing

## 2022-03-24 NOTE — Progress Notes (Signed)
PT Cancellation Note  Patient Details Name: William [name] MRN: [id number] DOB: 1940/12/22   Cancelled Treatment:    Reason Eval/Treat Not Completed: Patient at procedure or test/unavailable  3rd attempt to see pt today, currently having another study conducted in room. Will follow-up as schedule allows.  Candie Mile, PT, DPT Physical Therapist [facility] [place] [facility]  03/24/2022, 4:09 PM

## 2022-03-24 NOTE — Progress Notes (Signed)
STROKE TEAM PROGRESS NOTE   SUBJECTIVE (INTERVAL HISTORY) His son is at the bedside.  Overall his condition is stable. Pt sitting in chair, flat affect, but seems to have right upper quadrantanopia and right facial droop. TCD bubble study negative. However, positive LE DVT detected. Per son, pt was in hospital since STEMI on 12/31 and with decreased activity comparing with his routine before. He is 82 yo but still full time working at his [employer].    OBJECTIVE Temp:  [97.8 F (36.6 C)-98.2 F (36.8 C)] 98 F (36.7 C) (01/20 1200) Pulse Rate:  [75-88] 88 (01/20 1200) Cardiac Rhythm: Normal sinus rhythm (01/20 0706) Resp:  [18-29] 18 (01/20 1200) BP: (107-122)/(67-81) 122/68 (01/20 1200) SpO2:  [95 %-100 %] 99 % (01/20 1200) Weight:  [82.1 kg] 82.1 kg (01/20 0900)  Recent Labs  Lab 03/22/22 1556 03/23/22 1322  GLUCAP 150* 106*   Recent Labs  Lab 03/22/22 1557 03/22/22 1605 03/23/22 0650 03/24/22 0554  NA 135 132* 136 138  K 4.7 4.7 4.9 4.4  CL 94*  --  95* 96*  CO2 25  --  22 22  GLUCOSE 169*  --  109* 128*  BUN 76*  --  84* 86*  CREATININE 8.52*  --  9.44* 9.23*  CALCIUM 7.3*  --  7.3* 7.7*  MG  --   --  2.1 2.2   Recent Labs  Lab 03/22/22 1557  AST 34  ALT 42  ALKPHOS 70  BILITOT 0.7  PROT 6.3*  ALBUMIN 2.6*   Recent Labs  Lab 03/22/22 1557 03/22/22 1605 03/23/22 0650 03/24/22 0554  WBC 12.4*  --  10.6* 7.3  NEUTROABS 10.8*  --   --  5.9  HGB 9.8* 10.9* 9.0* 9.3*  HCT 30.7* 32.0* 28.1* 29.2*  MCV 105.1*  --  104.5* 105.4*  PLT 199  --  189 223   No results for input(s): "CKTOTAL", "CKMB", "CKMBINDEX", "TROPONINI" in the last 168 hours. Recent Labs    03/22/22 1557  LABPROT 15.7*  INR 1.3*   No results for input(s): "COLORURINE", "LABSPEC", "PHURINE", "GLUCOSEU", "HGBUR", "BILIRUBINUR", "KETONESUR", "PROTEINUR", "UROBILINOGEN", "NITRITE", "LEUKOCYTESUR" in the last 72 hours.  Invalid input(s): "APPERANCEUR"     Component Value Date/Time    CHOL 86 03/23/2022 0649   TRIG 85 03/23/2022 0649   HDL 37 (L) 03/23/2022 0649   CHOLHDL 2.3 03/23/2022 0649   VLDL 17 03/23/2022 0649   LDLCALC 32 03/23/2022 0649   Lab Results  Component Value Date   HGBA1C 5.5 03/14/2022   No results found for: "LABOPIA", "COCAINSCRNUR", "LABBENZ", "AMPHETMU", "THCU", "LABBARB"  No results for input(s): "ETH" in the last 168 hours.  I have personally reviewed the radiological images below and agree with the radiology interpretations.  VAS [place] LOWER EXTREMITY VENOUS (DVT)  Result Date: 03/24/2022  Lower Venous DVT Study Patient Name:  William [name]  Date of Exam:   03/24/2022 Medical Rec #: [id number]       Accession #:    [id number] Date of Birth: [birth year]      Patient Gender: M Patient Age:   54 years Exam Location:  [facility] Procedure:      VAS [place] LOWER EXTREMITY VENOUS (DVT) Referring Phys: Cornelius Moras S[place]e [place] --------------------------------------------------------------------------------  Indications: Stroke.  Risk Factors: None identified. Limitations: Poor ultrasound/tissue interface and patient positioning, patient movement. Comparison Study: No prior studies. Performing Technologist: Oliver Hum RVT  Examination Guidelines: A complete evaluation includes B-mode imaging, spectral Doppler, color Doppler, and power  Doppler as needed of all accessible portions of each vessel. Bilateral testing is considered an integral part of a complete examination. Limited examinations for reoccurring indications may be performed as noted. The reflux portion of the exam is performed with the patient in reverse Trendelenburg.  +---------+---------------+---------+-----------+----------+--------------+ RIGHT    CompressibilityPhasicitySpontaneityPropertiesThrombus Aging +---------+---------------+---------+-----------+----------+--------------+ CFV      Full           Yes      Yes                                  +---------+---------------+---------+-----------+----------+--------------+ SFJ      Full                                                        +---------+---------------+---------+-----------+----------+--------------+ FV Prox  Full                                                        +---------+---------------+---------+-----------+----------+--------------+ FV Mid   Full                                                        +---------+---------------+---------+-----------+----------+--------------+ FV DistalFull                                                        +---------+---------------+---------+-----------+----------+--------------+ PFV      Full                                                        +---------+---------------+---------+-----------+----------+--------------+ POP      Full           Yes      Yes                                 +---------+---------------+---------+-----------+----------+--------------+ PTV      Full                                                        +---------+---------------+---------+-----------+----------+--------------+ PERO     Full                                                        +---------+---------------+---------+-----------+----------+--------------+ SSV  None                                         Acute          +---------+---------------+---------+-----------+----------+--------------+   +---------+---------------+---------+-----------+----------+--------------+ LEFT     CompressibilityPhasicitySpontaneityPropertiesThrombus Aging +---------+---------------+---------+-----------+----------+--------------+ CFV      Full           Yes      Yes                                 +---------+---------------+---------+-----------+----------+--------------+ SFJ      Full                                                         +---------+---------------+---------+-----------+----------+--------------+ FV Prox  Full                                                        +---------+---------------+---------+-----------+----------+--------------+ FV Mid   Full                                                        +---------+---------------+---------+-----------+----------+--------------+ FV DistalFull                                                        +---------+---------------+---------+-----------+----------+--------------+ PFV      Full                                                        +---------+---------------+---------+-----------+----------+--------------+ POP      None           No       No                   Acute          +---------+---------------+---------+-----------+----------+--------------+ PTV      Full                                                        +---------+---------------+---------+-----------+----------+--------------+ PERO     None                                         Acute          +---------+---------------+---------+-----------+----------+--------------+  Summary: RIGHT: - Findings consistent with acute superficial vein thrombosis involving the right small saphenous vein. - There is no evidence of deep vein thrombosis in the lower extremity.  - No cystic structure found in the popliteal fossa.  LEFT: - Findings consistent with acute deep vein thrombosis involving the left popliteal vein, and left peroneal veins. - No cystic structure found in the popliteal fossa.  *See table(s) above for measurements and observations.    Preliminary    MR BRAIN WO CONTRAST  Result Date: 03/23/2022 CLINICAL DATA:  Neuro deficit, acute, stroke suspected.  Weakness. EXAM: MRI HEAD WITHOUT CONTRAST TECHNIQUE: Multiplanar, multiecho pulse sequences of the brain and surrounding structures were obtained without intravenous contrast was attempted. Only diffusion  images were acquired due to patient's inability to tolerate the exam. COMPARISON:  Head CT 03/23/2022. FINDINGS: Brain: Acute infarct in the medial left occipital lobe extending into the tail of the left hippocampus and posterior aspect of the left parahippocampal gyrus. No other areas of abnormal restricted diffusion. Vascular: No abnormal diffusion signal. Skull and upper cervical spine: No abnormal diffusion signal. Sinuses/Orbits: No abnormal diffusion signal. Other: None. IMPRESSION: Only diffusion images were acquired due to the patient's inability to tolerate the exam. 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 No other areas of restricted diffusion. Electronically Signed   By: Emmit Alexanders M.D.   On: 03/23/2022 14:04   CT HEAD WO CONTRAST (5MM)  Result Date: 03/23/2022 CLINICAL DATA:  Delirium EXAM: CT HEAD WITHOUT CONTRAST TECHNIQUE: Contiguous axial images were obtained from the base of the skull through the vertex without intravenous contrast. RADIATION DOSE REDUCTION: This exam was performed according to the departmental dose-optimization program which includes automated exposure control, adjustment of the mA and/or kV according to patient size and/or use of iterative reconstruction technique. COMPARISON:  CT head 01/21/2019 FINDINGS: Brain: There is hypodensity in the left occipital lobe suspicious for subacute infarct. There is possible trace petechial hemorrhage (3-15) but no mass effect. There is no acute intracranial hemorrhage or extra-axial fluid collection. Parenchymal volume is within expected limits for age. The ventricles are stable in size. Patchy and confluent hypodensity throughout the remainder of the supratentorial white matter likely reflects sequela of chronic small-vessel ischemic change. There is a small remote infarct in the right cerebellar hemisphere. The pituitary and suprasellar region are normal. There is no mass lesion. There is no mass effect or midline shift. Vascular:  There is calcification of the bilateral carotid siphons and vertebral arteries. Skull: Normal. Negative for fracture or focal lesion. Sinuses/Orbits: The imaged paranasal sinuses are clear. Bilateral lens implants are in place. The globes and orbits are otherwise unremarkable. Other: None. IMPRESSION: Suspected subacute infarct in the left occipital lobe with possible petechial hemorrhage but no mass effect. Recommend brain MRI for better evaluation. These results will be called to the ordering clinician or representative by the Radiologist Assistant, and communication documented in the PACS or [employer]. Electronically Signed   By: Valetta Mole M.D.   On: 03/23/2022 09:38   DG Chest Port 1 View  Result Date: 03/23/2022 CLINICAL DATA:  Shortness of breath.  Concern for pneumonia. EXAM: PORTABLE CHEST 1 VIEW COMPARISON:  03/22/2022 FINDINGS: Unchanged cardiac enlargement. Decreased left pleural effusion. Diffuse bilateral interstitial and airspace opacities are again noted. Compared with the previous exam there is been improved aeration to both lower lung zones. IMPRESSION: 1. Persistent bilateral interstitial and airspace opacities with improved aeration to both lower lung zones. 2. Decrease in left  pleural effusion. Electronically Signed   By: Kerby Moors M.D.   On: 03/23/2022 06:29   DG Chest Portable 1 View  Result Date: 03/22/2022 CLINICAL DATA:  Lethargic.  Decreasing blood pressure EXAM: PORTABLE CHEST 1 VIEW COMPARISON:  03/15/2022 x-ray and older FINDINGS: Enlarged cardiopericardial silhouette with increasing edema and perihilar opacities. Small effusions, left-greater-than-right. No pneumothorax. Tortuous and ectatic aorta. Overlapping cardiac leads. IMPRESSION: Developing bilateral opacities with effusions, possible edema. Secondary infiltrate is possible. No pneumothorax. Enlarged cardiopericardial silhouette. Electronically Signed   By: Jill Side M.D.   On: 03/22/2022 16:20   DG  CHEST PORT 1 VIEW  Result Date: 03/15/2022 CLINICAL DATA:  Follow-up exam. EXAM: PORTABLE CHEST 1 VIEW COMPARISON:  CXR 03/08/22 FINDINGS: No pleural effusion. No pneumothorax. Unchanged enlarged cardiac contours. Redemonstrated bilateral perihilar pulmonary opacities, improved from prior exam, and possibly suggestive improving infection or pulmonary edema. No displaced rib fractures. Visualized upper abdomen is unremarkable. IMPRESSION: Redemonstrated bilateral perihilar pulmonary opacities, improved from prior exam, and possibly suggestive of improving infection or pulmonary edema. Electronically Signed   By: Marin Roberts M.D.   On: 03/15/2022 10:12   CARDIAC CATHETERIZATION  Result Date: 03/14/2022   Mid LAD lesion is 85% stenosed.   Dist Cx lesion is 60% stenosed.   Prox Cx to Mid Cx lesion is 20% stenosed.   1st Diag lesion is 70% stenosed.   A drug-eluting stent was successfully placed.   Post intervention, there is a 0% residual stenosis. Successful percutaneous coronary intervention to an 85 to 90% mildly tortuous mid LAD stenosis rated with PTCA, Cutting Balloon, and ultimate stenting with a 3.0 x 18 mm Onyx Frontier DES stent postdilated to 3.25 mm with the stenosis being reduced to 0%. RECOMMENDATION: DAPT for minimum of 1 year.  Medical therapy for concomitant CAD.  The patient is on peritoneal dialysis.  Aggressive lipid-lowering therapy with target LDL less than 55.   CARDIAC CATHETERIZATION  Result Date: 03/12/2022 Conclusions: Multivessel coronary artery disease, as detailed below.  Most severe lesion is an 80-90% mid LAD stenosis.  There is also moderate to severe disease involving small branches (D1 and rPDA) as well as moderate stenosis affecting distal LCx and rPLAV. Moderately elevated left ventricular filling pressure (LVEDP 25-30 mmHg). Aborted attempt at PCI of the mid LAD due to inability to achieve therapeutic anticoagulation despite administration of 16,000 unit of IV heparin.  Recommendations: Consider PCI to mid LAD as soon as tomorrow using alternative intraprocedural anticoagulation strategy (i.e. bivalirudin). Dual antiplatelet therapy with aspirin and clopidogrel for at least 12 months. Consider escalation of fluid removal with peritoneal dialysis in the setting of moderately elevated LVEDP. Aggressive secondary prevention of coronary artery disease. Nelva Bush, MD [facility]  CT CHEST ABDOMEN PELVIS WO CONTRAST  Result Date: 03/08/2022 CLINICAL DATA:  82 year old with shortness of breath and abdominal pain. End-stage renal disease on peritoneal dialysis. EXAM: CT CHEST, ABDOMEN AND PELVIS WITHOUT CONTRAST TECHNIQUE: Multidetector CT imaging of the chest, abdomen and pelvis was performed following the standard protocol without IV contrast. RADIATION DOSE REDUCTION: This exam was performed according to the departmental dose-optimization program which includes automated exposure control, adjustment of the mA and/or kV according to patient size and/or use of iterative reconstruction technique. COMPARISON:  Chest radiograph 03/08/2022 and CT chest abdomen and pelvis 01/21/2019 FINDINGS: CT CHEST FINDINGS Cardiovascular: Extensive coronary artery calcifications. Heart size is slightly enlarged. No significant pericardial effusion. Normal caliber of the thoracic aorta with atherosclerotic calcifications. Mediastinum/Nodes: Prominent mediastinal lymph nodes.  Right paratracheal lymph node on sequence 3 image 19 measures up to 1.5 cm in the short axis and previously measured 0.7 cm. Limited evaluation for hilar lymph node enlargement due to the lack of intravascular contrast. No axillary lymph node enlargement. Lungs/Pleura: Small left pleural effusion and moderate sized right pleural effusion. Patchy confluent airspace densities in both lungs, most prominent in the upper lobes. Findings are suggestive for multifocal pneumonia. Musculoskeletal: No acute bone abnormality. CT  ABDOMEN PELVIS FINDINGS Hepatobiliary: Small amount of perihepatic ascites. Cholecystectomy. No gross abnormality to the liver. Pancreas: Unremarkable. No pancreatic ductal dilatation or surrounding inflammatory changes. Spleen: Limited evaluation due to motion artifact. Small amount of perisplenic ascites. Adrenals/Urinary Tract: Normal appearance of the adrenal glands. Both kidneys are atrophic without hydronephrosis. Normal appearance of the urinary bladder containing fluid. Small renal lesions likely represent cysts and do not require dedicated follow-up. Stomach/Bowel: Diverticula involving the duodenum near the ampulla. No evidence for bowel dilatation or obstruction. Colonic diverticula without acute bowel inflammation. Vascular/Lymphatic: Diffuse atherosclerotic calcifications in the abdominal aorta and iliac arteries. Negative for abdominal aortic aneurysm. No lymph node enlargement in the abdomen or pelvis. Reproductive: Prostate is prominent for size. Normal appearance of seminal vesicles. Other: Peritoneal dialysis catheter is coiled in the right anterior pelvis. Small amount of ascites or free fluid in the pelvis. Negative for free air. Again noted is a lobulated soft tissue structure containing fat in the right anterior abdomen measuring up to 7.5 cm on image 73/3. This is a chronic finding and probably represent prior inflammation or infarction in the omentum. Left inguinal hernia containing fat. Musculoskeletal: No acute bone abnormality. IMPRESSION: 1. Patchy airspace disease in both lungs is suggestive for multifocal pneumonia. 2. Bilateral pleural effusions, right side greater than left. 3. Small amount of ascites associated with the peritoneal dialysis catheter. 4. Prominent mediastinal lymph nodes are likely reactive but nonspecific. 5. Aortic Atherosclerosis (ICD10-I70.0). 6. 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 Electronically Signed    By: Markus Daft M.D.   On: 03/08/2022 17:06   DG Abd Portable 1V  Result Date: 03/08/2022 CLINICAL DATA:  Dyspnea EXAM: PORTABLE ABDOMEN - 1 VIEW COMPARISON:  03/04/2022 FINDINGS: Bowel gas pattern is nonspecific. Peritoneal dialysis catheter is seen with its tip in the right side of pelvis. Arterial calcifications are seen, especially prominent in tortuous splenic artery. Surgical clips are seen in right upper quadrant. IMPRESSION: Nonspecific bowel gas pattern. Electronically Signed   By: Elmer Picker M.D.   On: 03/08/2022 16:02   DG CHEST PORT 1 VIEW  Result Date: 03/08/2022 CLINICAL DATA:  Dyspnea EXAM: PORTABLE CHEST 1 VIEW COMPARISON:  Previous studies including the examination of 03/07/2022 FINDINGS: Transverse diameter of heart is increased. There is interval worsening of pulmonary vascular congestion. Increased interstitial and alveolar markings are seen in parahilar regions and lower lung fields. Lateral CP angles are clear. There is no pneumothorax. IMPRESSION: Cardiomegaly. Central pulmonary vessels are prominent suggesting CHF. Possibility of underlying pneumonia is not excluded. Electronically Signed   By: Elmer Picker M.D.   On: 03/08/2022 16:01   DG CHEST PORT 1 VIEW  Result Date: 03/07/2022 CLINICAL DATA:  Acute respiratory failure EXAM: PORTABLE CHEST 1 VIEW COMPARISON:  03/06/2022 FINDINGS: Stable perihilar airspace opacities. Borderline enlargement of the cardiopericardial silhouette. Minimal blunting of the left lateral costophrenic angle. Right lateral seventh rib deformity compatible with age-indeterminate fracture. Atherosclerotic calcification of the aortic arch. IMPRESSION: 1. Stable perihilar  airspace opacities favoring pulmonary edema over pneumonia. 2. Borderline enlargement of the cardiopericardial silhouette. 3. Right lateral seventh rib deformity compatible with age-indeterminate fracture. 4. Minimal blunting of the left lateral costophrenic angle, possibly from  a trace left pleural effusion or pleural thickening. Electronically Signed   By: Van Clines M.D.   On: 03/07/2022 09:34   DG CHEST PORT 1 VIEW  Result Date: 03/06/2022 CLINICAL DATA:  Shortness of breath. EXAM: PORTABLE CHEST 1 VIEW COMPARISON:  03/04/2022 FINDINGS: Interval removal of left IJ catheter, endotracheal tube catheter endotracheal tube, and enteric tube. Stable cardiac enlargement. Multifocal airspace disease throughout the right lung is unchanged from previous exam. New left upper lobe airspace opacity. Partial improved aeration to the left lung base. Small pleural effusions suspected. IMPRESSION: 1. Interval removal of support apparatus. 2. New left upper lobe airspace opacity. 3. Improved aeration to the left lung base. Electronically Signed   By: Kerby Moors M.D.   On: 03/06/2022 18:13   DG CHEST PORT 1 VIEW  Result Date: 03/05/2022 CLINICAL DATA:  Respiratory failure EXAM: PORTABLE CHEST 1 VIEW COMPARISON:  03/04/2022 FINDINGS: Diffuse opacity right hemithorax again noted. Left base consolidation. Vascular congestion. Probable left-sided pleural effusion. No pneumothorax identified. NG tube below the diaphragm and off x-ray. Endotracheal tube tip just below thoracic inlet. Left IJ CVC tip distal SVC. IMPRESSION: Diffuse opacity right hemithorax and left base consolidation or volume loss. Electronically Signed   By: Sammie Bench M.D.   On: 03/05/2022 11:11   DG Abd 1 View  Result Date: 03/04/2022 CLINICAL DATA:  Peritoneal dialysis EXAM: ABDOMEN - 1 VIEW COMPARISON:  03/03/2022 FINDINGS: Enteric tube is coiled within the stomach. Tip of the peritoneal dialysis catheter is coiled within the low right pelvis. Nonobstructive bowel gas pattern. Cholecystectomy clips. Calcified splenic artery. IMPRESSION: Tip of the peritoneal dialysis catheter is coiled within the low right pelvis. Electronically Signed   By: Davina Poke D.O.   On: 03/04/2022 14:12   DG Chest Port 1  View  Result Date: 03/04/2022 CLINICAL DATA:  Endotracheal tube in-situ. EXAM: PORTABLE CHEST 1 VIEW COMPARISON:  One-view chest x-ray 03/03/2022 FINDINGS: Heart is enlarged. Atherosclerotic changes are present the aortic arch. Endotracheal tube is stable, 5 cm above the carina. Left IJ line terminates at the cavoatrial junction. Diffuse interstitial and airspace opacities are present throughout the right lung, increasing in the right lower lobe. Perihilar opacities bilaterally are similar the prior study. Retrocardiac opacity is increasing. Bilateral pleural effusions are increasing. IMPRESSION: 1. Increasing interstitial and airspace opacities bilaterally consistent with multifocal pneumonia and probable pulmonary edema. 2. Increasing bilateral effusions. 3. Stable support apparatus. Electronically Signed   By: San Morelle M.D.   On: 03/04/2022 09:53   ECHOCARDIOGRAM COMPLETE  Result Date: 03/04/2022    ECHOCARDIOGRAM REPORT   Patient Name:   Avish Torry Date of Exam: 03/04/2022 Medical Rec #:  [id number]      Height:       72.0 in Accession #:    [id number]     Weight:       190.9 lb Date of Birth:  09/22/1940     BSA:          2.089 m Patient Age:    48 years       BP:           98/58 mmHg Patient Gender: M              HR:  51 bpm. Exam Location:  Inpatient Procedure: 2D Echo, Cardiac Doppler and Color Doppler                       STAT ECHO Reported to: Dr Phineas Inches on 03/04/2022 7:47:00 AM. Indications:    Cardiomyopathy-ischemic  History:        Patient has prior history of Echocardiogram examinations, most                 recent 03/29/2011. Risk Factors:Hypertension and Dyslipidemia.                 ESRD.  Sonographer:    Clayton Lefort RDCS (AE) Referring Phys: Frederik Pear  Sonographer Comments: Echo performed with patient supine and on artificial respirator. IMPRESSIONS  1. Left ventricular ejection fraction, by estimation, is 40 to 45%. The left ventricle has mildly  decreased function. The left ventricle demonstrates global hypokinesis. There is mild left ventricular hypertrophy. Left ventricular diastolic parameters are consistent with Grade I diastolic dysfunction (impaired relaxation).  2. Right ventricular systolic function is mildly reduced. The right ventricular size is normal. Tricuspid regurgitation signal is inadequate for assessing PA pressure.  3. Moderate pleural effusion.  4. Mild mitral valve regurgitation.  5. Aortic valve regurgitation is mild.  6. The inferior vena cava is normal in size with <50% respiratory variability, suggesting right atrial pressure of 8 mmHg. Comparison(s): No prior Echocardiogram. FINDINGS  Left Ventricle: Left ventricular ejection fraction, by estimation, is 40 to 45%. The left ventricle has mildly decreased function. The left ventricle demonstrates global hypokinesis. The left ventricular internal cavity size was normal in size. There is  mild left ventricular hypertrophy. Left ventricular diastolic parameters are consistent with Grade I diastolic dysfunction (impaired relaxation). Right Ventricle: The right ventricular size is normal. Right ventricular systolic function is mildly reduced. Tricuspid regurgitation signal is inadequate for assessing PA pressure. Left Atrium: Left atrial size was normal in size. Right Atrium: Right atrial size was normal in size. Pericardium: There is no evidence of pericardial effusion. Mitral Valve: Mild mitral valve regurgitation. Tricuspid Valve: Tricuspid valve regurgitation is not demonstrated. Aortic Valve: Aortic valve regurgitation is mild. Aortic regurgitation PHT measures 775 msec. Aortic valve mean gradient measures 4.0 mmHg. Aortic valve peak gradient measures 8.0 mmHg. Aortic valve area, by VTI measures 3.31 cm. Pulmonic Valve: Pulmonic valve regurgitation is mild. Aorta: The aortic root and ascending aorta are structurally normal, with no evidence of dilitation. Venous: The inferior vena  cava is normal in size with less than 50% respiratory variability, suggesting right atrial pressure of 8 mmHg. IAS/Shunts: The interatrial septum was not well visualized. Additional Comments: There is a moderate pleural effusion.  LEFT VENTRICLE PLAX 2D LVIDd:         6.50 cm      Diastology LVIDs:         4.90 cm      LV e' medial:    3.59 cm/s LV PW:         2.10 cm      LV E/e' medial:  17.0 LV IVS:        1.40 cm      LV e' lateral:   5.44 cm/s LVOT diam:     2.50 cm      LV E/e' lateral: 11.2 LV SV:         95 LV SV Index:   45 LVOT Area:     4.91 cm  LV Volumes (  MOD) LV vol d, MOD A2C: 258.0 ml LV vol d, MOD A4C: 186.0 ml LV vol s, MOD A2C: 139.0 ml LV vol s, MOD A4C: 112.0 ml LV SV MOD A2C:     119.0 ml LV SV MOD A4C:     186.0 ml LV SV MOD BP:      100.0 ml RIGHT VENTRICLE            IVC RV Basal diam:  3.20 cm    IVC diam: 2.10 cm RV S prime:     7.72 cm/s TAPSE (M-mode): 1.4 cm LEFT ATRIUM              Index        RIGHT ATRIUM           Index LA diam:        3.60 cm  1.72 cm/m   RA Area:     18.10 cm LA Vol (A2C):   101.0 ml 48.35 ml/m  RA Volume:   47.70 ml  22.84 ml/m LA Vol (A4C):   54.5 ml  26.09 ml/m LA Biplane Vol: 74.6 ml  35.71 ml/m  AORTIC VALVE AV Area (Vmax):    3.34 cm AV Area (Vmean):   3.54 cm AV Area (VTI):     3.31 cm AV Vmax:           141.00 cm/s AV Vmean:          87.000 cm/s AV VTI:            0.286 m AV Peak Grad:      8.0 mmHg AV Mean Grad:      4.0 mmHg LVOT Vmax:         95.90 cm/s LVOT Vmean:        62.700 cm/s LVOT VTI:          0.193 m LVOT/AV VTI ratio: 0.67 AI PHT:            775 msec  AORTA Ao Root diam: 3.50 cm Ao Asc diam:  3.80 cm MITRAL VALVE MV Area (PHT): 1.96 cm    SHUNTS MV Decel Time: 388 msec    Systemic VTI:  0.19 m MV E velocity: 61.20 cm/s  Systemic Diam: 2.50 cm Phineas Inches Electronically signed by Phineas Inches Signature Date/Time: 03/04/2022/8:04:15 AM    Final    DG Abd 1 View  Result Date: 03/03/2022 CLINICAL DATA:  Orogastric tube placement  EXAM: ABDOMEN - 1 VIEW COMPARISON:  08/30/2021 FINDINGS: Distal course of orogastric tube is coiled within the fundus of the stomach. Tip of enteric tube is seen in the region of antrum of the stomach. There are surgical clips in right upper quadrant. Bowel gas pattern in the upper abdomen is unremarkable. Lower abdomen and pelvis are not included in the image. There is a catheter in the mid abdomen suggesting possible peritoneal dialysis catheter. IMPRESSION: Tip of enteric tube is seen in the antrum of the stomach. Electronically Signed   By: Elmer Picker M.D.   On: 03/03/2022 18:48   DG CHEST PORT 1 VIEW  Result Date: 03/03/2022 CLINICAL DATA:  Evaluate endotracheal tube and enteric tube location EXAM: PORTABLE CHEST 1 VIEW COMPARISON:  Previous studies including the examination done earlier today FINDINGS: Transverse diameter of heart is increased. Tip of endotracheal tube is 5.6 cm above the carina. There is interval placement of left IJ central venous catheter with its tip in superior vena cava. Enteric tube is noted traversing the  esophagus. There is large alveolar infiltrate in right upper lung field with possible interval worsening. There are faint alveolar densities in both parahilar regions and lower lung fields suggesting multifocal pneumonia and possibly pulmonary edema. There is blunting of lateral CP angles. There is no pneumothorax. IMPRESSION: Dense alveolar infiltrate is seen in right upper lobe. There are alveolar densities in both parahilar regions and both lower lung fields with possible interval worsening suggesting multifocal pneumonia and possibly pulmonary edema. Small bilateral pleural effusions. Support devices as described in the body of the report. Electronically Signed   By: Elmer Picker M.D.   On: 03/03/2022 18:46   DG CHEST PORT 1 VIEW  Result Date: 03/03/2022 CLINICAL DATA:  Shortness of breath and cough. EXAM: PORTABLE CHEST 1 VIEW COMPARISON:  12/31/2021  FINDINGS: RIGHT UPPER lobe consolidation/airspace opacities noted, stable to slightly increased. New bilateral airspace opacities within the remainder of the lungs noted and may represent pneumonia and/or edema. There may be trace bilateral pleural effusions present. Cardiomediastinal silhouette is not significantly changed. Probable bibasilar atelectasis noted. IMPRESSION: 1. RIGHT UPPER lobe consolidation/airspace opacities, likely representing pneumonia. 2. New bilateral airspace opacities within the remainder of the lungs which may represent pneumonia and/or edema. Electronically Signed   By: Margarette Canada M.D.   On: 03/03/2022 15:05   DG Chest 2 View  Result Date: 03/02/2022 CLINICAL DATA:  Dyspnea EXAM: CHEST - 2 VIEW COMPARISON:  03/11/2021 FINDINGS: There is consolidation within the right upper lobe in keeping with changes of acute lobar pneumonia in the appropriate clinical setting. Left lung is clear. No pneumothorax or pleural effusion. Cardiac size within normal limits. No acute bone abnormality. IMPRESSION: 1. Right upper lobe pneumonia. Follow-up chest radiograph is recommended in 3-4 weeks to document resolution. Electronically Signed   By: Fidela Salisbury M.D.   On: 03/02/2022 22:00     PHYSICAL EXAM  Temp:  [97.8 F (36.6 C)-98.2 F (36.8 C)] 98 F (36.7 C) (01/20 1200) Pulse Rate:  [75-88] 88 (01/20 1200) Resp:  [18-29] 18 (01/20 1200) BP: (107-122)/(67-81) 122/68 (01/20 1200) SpO2:  [95 %-100 %] 99 % (01/20 1200) Weight:  [82.1 kg] 82.1 kg (01/20 0900)  General - Well nourished, well developed, in no apparent distress.  Ophthalmologic - fundi not visualized due to noncooperation.  Cardiovascular - Regular rhythm and rate.  Neuro - awake, alert, eyes open, orientated to people, but told me his age on the second try, not orientated to place (said clinic) or time. No aphasia, paucity of speech but no dysarthria, following all simple commands. Able to name and repeat. No gaze  palsy, right upper quadrantanopia. Mild right facial droop. Tongue midline. Bilateral UEs 5/5, no drift. Bilaterally LEs 5/5, no drift. Sensation symmetrical bilaterally, b/l FTN intact, gait not tested.     ASSESSMENT/PLAN Mr. Carroll Lingelbach is a 82 y.o. male with history of ESRD on PD, hyperlipidemia, CAD with recent non-STEMI and status post stenting, CHF admitted for generalized weakness, confusion, decreased p.o. intake. No tPA given due to altered mental.    Stroke:  left PCA infarct with petechial hemorrhage embolic pattern secondary to cryptogenic source CT left PCA infarct with petechial hemorrhage MRI confirmed left PCA infarct with petechial hemorrhage MRA pending Carotid Doppler unremarkable 2D Echo EF 40 to 45% LE venous Doppler showed left popliteal vein and peroneal vein acute DVT TCD bubble study no PFO LDL 32 HgbA1c 5.5 Eliquis for VTE prophylaxis aspirin 81 mg daily and clopidogrel 75 mg daily prior to admission,  now on Eliquis (apixaban) daily and Plavix.  Continue on discharge Ongoing aggressive stroke risk factor management Therapy recommendations: Home health Disposition: Pending  DVT LE venous Doppler showed left popliteal vein and peroneal vein acute DVT On eliquis   CAD with NSTEMI Admitted for NSTEMI on 12/31  S/p cardiac stenting on 1/10 Was on DAPT Due to eliquis, currently on plavix.   Hyperlipidemia Home meds: Lipitor 20, Zetia 10 LDL 72, goal < 70 Now on Lipitor 20 and Zetia 10 Continue statin at discharge  Other Stroke Risk Factors Advanced age CHF with EF 40 to 45%, on metoprolol  Other Active Problems ESRD on PD, nephrology on board Hypotension on midodrine  Hospital day # 1    Rosalin Hawking, MD PhD Stroke Neurology 03/24/2022 4:00 PM    To contact Stroke Continuity provider, please refer to [email or web address] After hours, contact General Neurology

## 2022-03-24 NOTE — Progress Notes (Signed)
ANTICOAGULATION CONSULT NOTE - Initial Consult  Pharmacy Consult for apixaban Indication: DVT  Allergies  Allergen Reactions   Nsaids Other (See Comments)    Chronic kidney disease   Valacyclovir Nausea And Vomiting   Lisinopril Rash    Patient Measurements: Height: 6' (182.9 cm) Weight: 82.1 kg (181 lb) IBW/kg (Calculated) : 77.6  Vital Signs: Temp: 97.9 F (36.6 C) (01/20 0400) Temp Source: Axillary (01/20 0400) BP: 114/76 (01/20 0400) Pulse Rate: 75 (01/20 0400)  Labs: Recent Labs    03/22/22 1557 03/22/22 1605 03/22/22 1759 03/22/22 2341 03/23/22 0650 03/24/22 0554  HGB 9.8* 10.9*  --   --  9.0* 9.3*  HCT 30.7* 32.0*  --   --  28.1* 29.2*  PLT 199  --   --   --  189 223  LABPROT 15.7*  --   --   --   --   --   INR 1.3*  --   --   --   --   --   CREATININE 8.52*  --   --   --  9.44* 9.23*  TROPONINIHS  --   --  153* 143*  --   --     Estimated Creatinine Clearance: 7 mL/min (A) (by C-G formula based on SCr of 9.23 mg/dL (H)).   Medical History: Past Medical History:  Diagnosis Date   Aortic insufficiency    Coronary artery disease    ESRD on peritoneal dialysis (HCC)    Hyperlipidemia    Hypertension     Medications:  Scheduled:   apixaban  10 mg Oral BID   Followed by   Derrill Memo ON April 19, 2022] apixaban  5 mg Oral BID   atorvastatin  20 mg Oral Daily   clopidogrel  75 mg Oral Daily   docusate sodium  100 mg Oral Daily   ezetimibe  10 mg Oral Daily   feeding supplement  237 mL Oral BID BM   gentamicin cream  1 Application Topical Daily   metoprolol tartrate  25 mg Oral BID   midodrine  5 mg Oral TID WC   multivitamin  1 tablet Oral QHS   pantoprazole  40 mg Oral Daily   polyethylene glycol  17 g Oral Daily   sevelamer carbonate  800 mg Oral TID WC    Assessment: 81 YOM notes to have occipital lobe stroke via head CT who was previously on DAPT due to recent stent placement now found to have DVT. Dr. Gardiner Rhyme recommending plavix plus AC, no  aspirin. Pharmacy has been consulted to start apixaban.    Goal of Therapy:  Monitor platelets by anticoagulation protocol: Yes   Plan:  Start apixaban '5mg'$  BID for DVT treatment - avoiding load due to recent stroke and risk of hemorrhagic conversion  Monitor for signs and symptoms of bleeding Follow up to complete education and copay check   Vicenta Dunning, PharmD  PGY1 Pharmacy Resident

## 2022-03-24 NOTE — Progress Notes (Addendum)
[facility] Progress Note   Assessment/ Plan:   1 Acute hypoxic RF: recurrent PNA + some component of volume overload.  Being treated with vanc/ cefepime--> Unasyn  modified PD script seemed to be somewhat effective last night.  Dont' think he'll need that going forward- just while here.     2 ESRD: on PD- as above in #1  Got Lasix as well to augment UOP- little UOP though- will do bladder scan 3 Hypotension: midodrine 5 TID 4. 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 Metabolic Bone Disease:  renal vitamin with fluid restriction 6.  NSTEMI: s/p LHC 1/10, on ASA/ Plavix 7.  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  Getting MRI/MRA (acute infarct in L PCA territory) carotid US 8.  Dispo: pending  Subjective:    Seen in room- just finishing up PD, looks like it went OK last night.  More awake and less tachypneic.  Pt and family are very eager to go home.     Objective:   BP 114/76 (BP Location: Right Arm)   Pulse 75   Temp 97.9 F (36.6 C) (Axillary)   Resp (!) 29   Ht 6' (1.829 m)   Wt 83.9 kg   SpO2 95%   BMI 25.09 kg/m   Physical Exam: GEN  older gentleman, more awake and alert HEENT EOMI PERRL NECK JVP improved PULM coarse throughout CV RRR  ABD: soft, nontender EXT no LE edema ACCESS PD cath intact. C/d/I  Labs: BMET Recent Labs  Lab 03/22/22 1557 03/22/22 1605 03/23/22 0650 03/24/22 0554  NA 135 132* 136 138  K 4.7 4.7 4.9 4.4  CL 94*  --  95* 96*  CO2 25  --  22 22  GLUCOSE 169*  --  109* 128*  BUN 76*  --  84* 86*  CREATININE 8.52*  --  9.44* 9.23*  CALCIUM 7.3*  --  7.3* 7.7*   CBC Recent Labs  Lab 03/22/22 1557 03/22/22 1605 03/23/22 0650 03/24/22 0554  WBC 12.4*  --  10.6* 7.3  NEUTROABS 10.8*  --   --  5.9  HGB 9.8* 10.9* 9.0* 9.3*  HCT 30.7* 32.0* 28.1* 29.2*  MCV 105.1*  --  104.5* 105.4*  PLT 199  --  189 223      Medications:     aspirin  81 mg Oral Daily   atorvastatin  20 mg Oral Daily    clopidogrel  75 mg Oral Daily   docusate sodium  100 mg Oral Daily   ezetimibe  10 mg Oral Daily   feeding supplement  237 mL Oral BID BM   gentamicin cream  1 Application Topical Daily   heparin injection (subcutaneous)  5,000 Units Subcutaneous Q8H   metoprolol tartrate  25 mg Oral BID   midodrine  5 mg Oral TID WC   multivitamin  1 tablet Oral QHS   pantoprazole  40 mg Oral Daily   polyethylene glycol  17 g Oral Daily   sevelamer carbonate  800 mg Oral TID WC     Madelon Lips MD 03/24/2022, 8:57 AM

## 2022-03-24 NOTE — Progress Notes (Signed)
Echocardiogram 2D Echocardiogram has been performed.  William [name] 03/24/2022, 4:09 PM

## 2022-03-25 ENCOUNTER — Inpatient Hospital Stay (HOSPITAL_COMMUNITY): Payer: PPO

## 2022-03-25 DIAGNOSIS — N186 End stage renal disease: Secondary | ICD-10-CM

## 2022-03-25 DIAGNOSIS — I82462 Acute embolism and thrombosis of left calf muscular vein: Secondary | ICD-10-CM

## 2022-03-25 DIAGNOSIS — I63432 Cerebral infarction due to embolism of left posterior cerebral artery: Secondary | ICD-10-CM | POA: Diagnosis not present

## 2022-03-25 DIAGNOSIS — I2510 Atherosclerotic heart disease of native coronary artery without angina pectoris: Secondary | ICD-10-CM | POA: Diagnosis not present

## 2022-03-25 DIAGNOSIS — I5043 Acute on chronic combined systolic (congestive) and diastolic (congestive) heart failure: Secondary | ICD-10-CM

## 2022-03-25 DIAGNOSIS — J9601 Acute respiratory failure with hypoxia: Secondary | ICD-10-CM | POA: Diagnosis not present

## 2022-03-25 DIAGNOSIS — Z992 Dependence on renal dialysis: Secondary | ICD-10-CM | POA: Diagnosis not present

## 2022-03-25 DIAGNOSIS — J189 Pneumonia, unspecified organism: Secondary | ICD-10-CM | POA: Diagnosis not present

## 2022-03-25 LAB — BASIC METABOLIC PANEL
Anion gap: 20 — ABNORMAL HIGH (ref 5–15)
BUN: 87 mg/dL — ABNORMAL HIGH (ref 8–23)
CO2: 22 mmol/L (ref 22–32)
Calcium: 7.8 mg/dL — ABNORMAL LOW (ref 8.9–10.3)
Chloride: 96 mmol/L — ABNORMAL LOW (ref 98–111)
Creatinine, Ser: 9.24 mg/dL — ABNORMAL HIGH (ref 0.61–1.24)
GFR, Estimated: 5 mL/min — ABNORMAL LOW (ref >60)
Glucose, Bld: 164 mg/dL — ABNORMAL HIGH (ref 70–99)
Potassium: 4 mmol/L (ref 3.5–5.1)
Sodium: 138 mmol/L (ref 135–145)

## 2022-03-25 LAB — CBC WITH DIFFERENTIAL/PLATELET
Abs Immature Granulocytes: 0.02 10*3/uL (ref 0.00–0.07)
Basophils Absolute: 0.1 10*3/uL (ref 0.0–0.1)
Basophils Relative: 1 %
Eosinophils Absolute: 0.2 10*3/uL (ref 0.0–0.5)
Eosinophils Relative: 2 %
HCT: 30.1 % — ABNORMAL LOW (ref 39.0–52.0)
Hemoglobin: 9.6 g/dL — ABNORMAL LOW (ref 13.0–17.0)
Immature Granulocytes: 0 %
Lymphocytes Relative: 5 %
Lymphs Abs: 0.4 10*3/uL — ABNORMAL LOW (ref 0.7–4.0)
MCH: 33.6 pg (ref 26.0–34.0)
MCHC: 31.9 g/dL (ref 30.0–36.0)
MCV: 105.2 fL — ABNORMAL HIGH (ref 80.0–100.0)
Monocytes Absolute: 0.7 10*3/uL (ref 0.1–1.0)
Monocytes Relative: 9 %
Neutro Abs: 6.7 10*3/uL (ref 1.7–7.7)
Neutrophils Relative %: 83 %
Platelets: 258 10*3/uL (ref 150–400)
RBC: 2.86 MIL/uL — ABNORMAL LOW (ref 4.22–5.81)
RDW: 14.7 % (ref 11.5–15.5)
WBC: 8.1 10*3/uL (ref 4.0–10.5)
nRBC: 0 % (ref 0.0–0.2)

## 2022-03-25 LAB — BRAIN NATRIURETIC PEPTIDE: B Natriuretic Peptide: >4500 pg/mL — ABNORMAL HIGH (ref 0.0–100.0)

## 2022-03-25 LAB — MAGNESIUM: Magnesium: 2.3 mg/dL (ref 1.7–2.4)

## 2022-03-25 LAB — C-REACTIVE PROTEIN: CRP: 16.8 mg/dL — ABNORMAL HIGH (ref <1.0)

## 2022-03-25 LAB — PROCALCITONIN: Procalcitonin: 0.6 ng/mL

## 2022-03-25 MED ORDER — PRISMASOL BGK 4/2.5 32-4-2.5 MEQ/L REPLACEMENT SOLN
Status: DC
  Filled 2022-03-25 (×2): qty 5000

## 2022-03-25 MED ORDER — "THROMBI-PAD 3""X3"" EX PADS"
1.0000 | MEDICATED_PAD | Freq: Once | CUTANEOUS | Status: AC
  Administered 2022-03-25: 1 via TOPICAL
  Filled 2022-03-25 (×2): qty 1

## 2022-03-25 MED ORDER — PRISMASOL BGK 4/2.5 32-4-2.5 MEQ/L EC SOLN
Status: DC
  Filled 2022-03-25 (×6): qty 5000

## 2022-03-25 MED ORDER — ONDANSETRON 4 MG PO TBDP: 4.0000 mg | ORAL_TABLET | Freq: Three times a day (TID) | ORAL | Status: DC | PRN

## 2022-03-25 MED ORDER — SODIUM CHLORIDE 0.9 % IV SOLN: INTRAVENOUS | Status: DC | PRN

## 2022-03-25 MED ORDER — ONDANSETRON HCL 4 MG/2ML IJ SOLN
4.0000 mg | Freq: Four times a day (QID) | INTRAMUSCULAR | Status: DC | PRN
  Administered 2022-03-25: 4 mg via INTRAVENOUS
  Filled 2022-03-25 (×2): qty 2

## 2022-03-25 MED ORDER — CHLORHEXIDINE GLUCONATE CLOTH 2 % EX PADS
6.0000 | MEDICATED_PAD | Freq: Every day | CUTANEOUS | Status: DC
  Administered 2022-03-25 – 2022-03-29 (×5): 6 via TOPICAL

## 2022-03-25 MED ORDER — HEPARIN SODIUM (PORCINE) 1000 UNIT/ML DIALYSIS
1000.0000 [IU] | INTRAMUSCULAR | Status: DC | PRN
  Administered 2022-03-27: 2400 [IU] via INTRAVENOUS_CENTRAL
  Filled 2022-03-25 (×2): qty 6

## 2022-03-25 NOTE — Progress Notes (Signed)
STROKE TEAM PROGRESS NOTE   SUBJECTIVE (INTERVAL HISTORY) His daughter is at the bedside. Pt lying in bed, initially sleeping but easily arousable. Still encephalopathic, not orientated to place, time or age, but orientated to people. PD seems not effective, nephrology on board, will need CRRT in ICU with HD catheter placed. On eliquis and plavix.    OBJECTIVE Temp:  [97.7 F (36.5 C)-98.4 F (36.9 C)] 98.4 F (36.9 C) (01/21 1615) Pulse Rate:  [64-74] 64 (01/21 1800) Cardiac Rhythm: Normal sinus rhythm;Heart block (01/21 1317) Resp:  [14-34] 21 (01/21 1800) BP: (104-128)/(66-74) 123/71 (01/21 1800) SpO2:  [90 %-100 %] 100 % (01/21 1800) Weight:  [80.3 kg] 80.3 kg (01/21 1500)  Recent Labs  Lab 03/22/22 1556 03/23/22 1322  GLUCAP 150* 106*   Recent Labs  Lab 03/22/22 1557 03/22/22 1605 03/23/22 0650 03/24/22 0554 03/25/22 0245  NA 135 132* 136 138 138  K 4.7 4.7 4.9 4.4 4.0  CL 94*  --  95* 96* 96*  CO2 25  --  '22 22 22  '$ GLUCOSE 169*  --  109* 128* 164*  BUN 76*  --  84* 86* 87*  CREATININE 8.52*  --  9.44* 9.23* 9.24*  CALCIUM 7.3*  --  7.3* 7.7* 7.8*  MG  --   --  2.1 2.2 2.3   Recent Labs  Lab 03/22/22 1557  AST 34  ALT 42  ALKPHOS 70  BILITOT 0.7  PROT 6.3*  ALBUMIN 2.6*   Recent Labs  Lab 03/22/22 1557 03/22/22 1605 03/23/22 0650 03/24/22 0554 03/25/22 0245  WBC 12.4*  --  10.6* 7.3 8.1  NEUTROABS 10.8*  --   --  5.9 6.7  HGB 9.8* 10.9* 9.0* 9.3* 9.6*  HCT 30.7* 32.0* 28.1* 29.2* 30.1*  MCV 105.1*  --  104.5* 105.4* 105.2*  PLT 199  --  189 223 258   No results for input(s): "CKTOTAL", "CKMB", "CKMBINDEX", "TROPONINI" in the last 168 hours. No results for input(s): "LABPROT", "INR" in the last 72 hours.  No results for input(s): "COLORURINE", "LABSPEC", "PHURINE", "GLUCOSEU", "HGBUR", "BILIRUBINUR", "KETONESUR", "PROTEINUR", "UROBILINOGEN", "NITRITE", "LEUKOCYTESUR" in the last 72 hours.  Invalid input(s): "APPERANCEUR"     Component Value  Date/Time   CHOL 86 03/23/2022 0649   TRIG 85 03/23/2022 0649   HDL 37 (L) 03/23/2022 0649   CHOLHDL 2.3 03/23/2022 0649   VLDL 17 03/23/2022 0649   LDLCALC 32 03/23/2022 0649   Lab Results  Component Value Date   HGBA1C 5.5 03/14/2022   No results found for: "LABOPIA", "COCAINSCRNUR", "LABBENZ", "AMPHETMU", "THCU", "LABBARB"  No results for input(s): "ETH" in the last 168 hours.  I have personally reviewed the radiological images below and agree with the radiology interpretations.  DG CHEST PORT 1 VIEW  Result Date: 03/25/2022 CLINICAL DATA:  Central line placement EXAM: PORTABLE CHEST 1 VIEW COMPARISON:  Chest radiograph 03/23/2022 FINDINGS: Monitoring leads overlie the patient. New right IJ central venous catheter tip projects over the expected location of the superior vena cava. Stable cardiomegaly. Similar bilateral airspace opacities. Probable small left pleural effusion. No pneumothorax. Thoracic spine degenerative changes. IMPRESSION: New right IJ central venous catheter tip projects over the expected location of the superior vena cava. Electronically Signed   By: Lovey Newcomer M.D.   On: 03/25/2022 15:04   VAS [place] LOWER EXTREMITY VENOUS (DVT)  Result Date: 03/25/2022  Lower Venous DVT Study Patient Name:  ICHIRO CHESNUT  Date of Exam:   03/24/2022 Medical Rec #: [id number]  Accession #:    [id number] Date of Birth: [birth year]      Patient Gender: M Patient Age:   82 years Exam Location:  [facility] Procedure:      VAS [place] LOWER EXTREMITY VENOUS (DVT) Referring Phys: Cornelius Moras Dredyn Gubbels --------------------------------------------------------------------------------  Indications: Stroke.  Risk Factors: None identified. Limitations: Poor ultrasound/tissue interface and patient positioning, patient movement. Comparison Study: No prior studies. Performing Technologist: Oliver Hum RVT  Examination Guidelines: A complete evaluation includes B-mode imaging, spectral Doppler, color  Doppler, and power Doppler as needed of all accessible portions of each vessel. Bilateral testing is considered an integral part of a complete examination. Limited examinations for reoccurring indications may be performed as noted. The reflux portion of the exam is performed with the patient in reverse Trendelenburg.  +---------+---------------+---------+-----------+----------+--------------+ RIGHT    CompressibilityPhasicitySpontaneityPropertiesThrombus Aging +---------+---------------+---------+-----------+----------+--------------+ CFV      Full           Yes      Yes                                 +---------+---------------+---------+-----------+----------+--------------+ SFJ      Full                                                        +---------+---------------+---------+-----------+----------+--------------+ FV Prox  Full                                                        +---------+---------------+---------+-----------+----------+--------------+ FV Mid   Full                                                        +---------+---------------+---------+-----------+----------+--------------+ FV DistalFull                                                        +---------+---------------+---------+-----------+----------+--------------+ PFV      Full                                                        +---------+---------------+---------+-----------+----------+--------------+ POP      Full           Yes      Yes                                 +---------+---------------+---------+-----------+----------+--------------+ PTV      Full                                                        +---------+---------------+---------+-----------+----------+--------------+  PERO     Full                                                        +---------+---------------+---------+-----------+----------+--------------+ SSV      None                                          Acute          +---------+---------------+---------+-----------+----------+--------------+   +---------+---------------+---------+-----------+----------+--------------+ LEFT     CompressibilityPhasicitySpontaneityPropertiesThrombus Aging +---------+---------------+---------+-----------+----------+--------------+ CFV      Full           Yes      Yes                                 +---------+---------------+---------+-----------+----------+--------------+ SFJ      Full                                                        +---------+---------------+---------+-----------+----------+--------------+ FV Prox  Full                                                        +---------+---------------+---------+-----------+----------+--------------+ FV Mid   Full                                                        +---------+---------------+---------+-----------+----------+--------------+ FV DistalFull                                                        +---------+---------------+---------+-----------+----------+--------------+ PFV      Full                                                        +---------+---------------+---------+-----------+----------+--------------+ POP      None           No       No                   Acute          +---------+---------------+---------+-----------+----------+--------------+ PTV      Full                                                        +---------+---------------+---------+-----------+----------+--------------+  PERO     None                                         Acute          +---------+---------------+---------+-----------+----------+--------------+     Summary: RIGHT: - Findings consistent with acute superficial vein thrombosis involving the right small saphenous vein. - There is no evidence of deep vein thrombosis in the lower extremity.  - No cystic structure found in the  popliteal fossa.  LEFT: - Findings consistent with acute deep vein thrombosis involving the left popliteal vein, and left peroneal veins. - No cystic structure found in the popliteal fossa.  *See table(s) above for measurements and observations. Electronically signed by Harold Barban MD on 03/25/2022 at 2:17:21 PM.    Final    DG Abd Portable 1V  Result Date: 03/25/2022 CLINICAL DATA:  Nausea. EXAM: PORTABLE ABDOMEN - 1 VIEW COMPARISON:  03/08/2022 FINDINGS: No gaseous bowel dilatation. Peritoneal dialysis catheter evident. Density over the central pelvis is compatible with a distended bladder. IMPRESSION: No evidence for bowel obstruction. No findings to explain the patient's history of pain. Electronically Signed   By: Misty Stanley M.D.   On: 03/25/2022 11:54   ECHOCARDIOGRAM LIMITED BUBBLE STUDY  Result Date: 03/24/2022    ECHOCARDIOGRAM LIMITED REPORT   Patient Name:   FRENCHIE DANGERFIELD Date of Exam: 03/24/2022 Medical Rec #:  [id number]      Height:       72.0 in Accession #:    [id number]     Weight:       181.0 lb Date of Birth:  1941/02/07     BSA:          2.042 m Patient Age:    83 years       BP:           114/76 mmHg Patient Gender: M              HR:           75 bpm. Exam Location:  Inpatient Procedure: Limited Echo and Limited Color Doppler Indications:    Stroke I63.9  History:        Patient has prior history of Echocardiogram examinations, most                 recent 03/04/2022. CHF, Previous Myocardial Infarction and CAD;                 Risk Factors:Hypertension and Dyslipidemia. ESRD.  Sonographer:    Ronny Flurry Referring Phys: Bazine K [place]  1. Left ventricular ejection fraction, by estimation, is 25 to 30%. The left ventricle has severely decreased function. The left ventricle demonstrates global hypokinesis. The left ventricular internal cavity size was severely dilated. There is mild left ventricular hypertrophy. Left ventricular diastolic parameters are  indeterminate.  2. Right ventricular systolic function is moderately reduced. The right ventricular size is mildly enlarged. Tricuspid regurgitation signal is inadequate for assessing PA pressure.  3. The mitral valve is abnormal. Moderate mitral valve regurgitation. Appears functional  4. The aortic valve was not well visualized. Aortic valve regurgitation is mild.  5. Aortic dilatation noted. Aneurysm of the aortic root, measuring 48 mm.  6. The inferior vena cava is dilated in size with <50% respiratory variability, suggesting right atrial pressure of 15 mmHg.  7. Agitated saline contrast bubble study  was positive with shunting observed after >6 cardiac cycles suggestive of intrapulmonary shunting. FINDINGS  Left Ventricle: Left ventricular ejection fraction, by estimation, is 25 to 30%. The left ventricle has severely decreased function. The left ventricle demonstrates global hypokinesis. The left ventricular internal cavity size was severely dilated. There is mild left ventricular hypertrophy. Left ventricular diastolic parameters are indeterminate. Right Ventricle: The right ventricular size is mildly enlarged. Right ventricular systolic function is moderately reduced. Tricuspid regurgitation signal is inadequate for assessing PA pressure. Mitral Valve: The mitral valve is abnormal. Moderate mitral valve regurgitation. Aortic Valve: The aortic valve was not well visualized. Aortic valve regurgitation is mild. Aorta: Aortic dilatation noted. There is an aneurysm involving the aortic root measuring 48 mm. Venous: The inferior vena cava is dilated in size with less than 50% respiratory variability, suggesting right atrial pressure of 15 mmHg. IAS/Shunts: Agitated saline contrast bubble study was positive with shunting observed after >6 cardiac cycles suggestive of intrapulmonary shunting. LEFT VENTRICLE PLAX 2D LVIDd:         6.90 cm   Diastology LVIDs:         6.50 cm   LV e' medial:  3.45 cm/s LV PW:          1.10 cm   LV e' lateral: 5.40 cm/s LV IVS:        0.90 cm LVOT diam:     2.10 cm LVOT Area:     3.46 cm  RIGHT VENTRICLE             IVC RV S prime:     11.90 cm/s  IVC diam: 2.30 cm LEFT ATRIUM         Index LA diam:    5.10 cm 2.50 cm/m   AORTA Ao Root diam: 4.80 cm Ao Asc diam:  3.60 cm  SHUNTS Systemic Diam: 2.10 cm Oswaldo Milian MD Electronically signed by Oswaldo Milian MD Signature Date/Time: 03/24/2022/5:26:35 PM    Final    VAS [place] TRANSCRANIAL DOPPLER W BUBBLES  Result Date: 03/24/2022  Transcranial Doppler with Bubble Patient Name:  ABDULLA POOLEY  Date of Exam:   03/24/2022 Medical Rec #: [id number]       Accession #:    [id number] Date of Birth: 1941/02/28      Patient Gender: M Patient Age:   76 years Exam Location:  [facility] [place] Procedure:      VAS [place] TRANSCRANIAL [place] Referring Phys: Cornelius Moras Carylon Tamburro --------------------------------------------------------------------------------  Indications: Stroke. History: Positive for lower extremity DVT. Performing Technologist: Darlin Coco RDMS, RVT  Examination Guidelines: A complete evaluation includes B-mode imaging, spectral Doppler, color Doppler, and power Doppler as needed of all accessible portions of each vessel. Bilateral testing is considered an integral part of a complete examination. Limited examinations for reoccurring indications may be performed as noted.  Summary: No HITS at rest or during Valsalva. Negative transcranial Doppler Bubble study with no evidence of right to left intracardiac communication.  A vascular evaluation was performed. The right middle cerebral artery was studied. An IV was inserted into the patient's left antecubital. Verbal informed consent was obtained.  *See table(s) above for TCD measurements and observations.  Diagnosing physician: Rosalin Hawking MD Electronically signed by Rosalin Hawking MD on 03/24/2022 at 5:16:55 PM.    Final    VAS US CAROTID  Result Date: 03/24/2022 Carotid Arterial  Duplex Study Patient Name:  LLEYTON BYERS  Date of Exam:   03/24/2022 Medical Rec #: [id number]  Accession #:    [id number] Date of Birth: 20-Jun-1940      Patient Gender: M Patient Age:   30 years Exam Location:  [facility] Procedure:      VAS US CAROTID Referring Phys: Deno Etienne [facility] --------------------------------------------------------------------------------  Indications:       CVA. Risk Factors:      Hypertension, hyperlipidemia, past history of smoking,                    coronary artery disease. Other Factors:     ESRD. Comparison Study:  No prior studies. Performing Technologist: Darlin Coco RDMS, RVT  Examination Guidelines: A complete evaluation includes B-mode imaging, spectral Doppler, color Doppler, and power Doppler as needed of all accessible portions of each vessel. Bilateral testing is considered an integral part of a complete examination. Limited examinations for reoccurring indications may be performed as noted.  Right Carotid Findings: +----------+--------+--------+--------+------------------+------------------+           PSV cm/sEDV cm/sStenosisPlaque DescriptionComments           +----------+--------+--------+--------+------------------+------------------+ CCA Prox  65      10                                                   +----------+--------+--------+--------+------------------+------------------+ CCA Distal39      6                                 intimal thickening +----------+--------+--------+--------+------------------+------------------+ ICA Prox  56      14                                                   +----------+--------+--------+--------+------------------+------------------+ ICA Mid   83      27                                                   +----------+--------+--------+--------+------------------+------------------+ ICA Distal75      20                                                    +----------+--------+--------+--------+------------------+------------------+ ECA       38                                                           +----------+--------+--------+--------+------------------+------------------+ +----------+--------+-------+----------------+-------------------+           PSV cm/sEDV cmsDescribe        Arm Pressure (mmHG) +----------+--------+-------+----------------+-------------------+ [id number]             Multiphasic, WNL                    +----------+--------+-------+----------------+-------------------+ +---------+--------+--+--------+--+---------+ VertebralPSV cm/s48EDV  cm/s10Antegrade +---------+--------+--+--------+--+---------+  Left Carotid Findings: +----------+--------+--------+--------+------------------+------------------+           PSV cm/sEDV cm/sStenosisPlaque DescriptionComments           +----------+--------+--------+--------+------------------+------------------+ CCA Prox  87      11                                                   +----------+--------+--------+--------+------------------+------------------+ CCA Distal53      9                                 intimal thickening +----------+--------+--------+--------+------------------+------------------+ ICA Prox  53      16                                tortuous           +----------+--------+--------+--------+------------------+------------------+ ICA Distal106     23                                                   +----------+--------+--------+--------+------------------+------------------+ ECA       55                                                           +----------+--------+--------+--------+------------------+------------------+ +----------+--------+--------+----------------+-------------------+           PSV cm/sEDV cm/sDescribe        Arm Pressure (mmHG) +----------+--------+--------+----------------+-------------------+  [id number]              Multiphasic, WNL                    +----------+--------+--------+----------------+-------------------+ +---------+--------+--+--------+--+---------+ VertebralPSV cm/s40EDV cm/s14Antegrade +---------+--------+--+--------+--+---------+   Summary: Right Carotid: The extracranial vessels were near-normal with only minimal wall                thickening or plaque. Left Carotid: The extracranial vessels were near-normal with only minimal wall               thickening or plaque. Vertebrals:  Bilateral vertebral arteries demonstrate antegrade flow. Subclavians: Normal flow hemodynamics were seen in bilateral subclavian              arteries. *See table(s) above for measurements and observations.     Preliminary    MR BRAIN WO CONTRAST  Result Date: 03/23/2022 CLINICAL DATA:  Neuro deficit, acute, stroke suspected.  Weakness. EXAM: MRI HEAD WITHOUT CONTRAST TECHNIQUE: Multiplanar, multiecho pulse sequences of the brain and surrounding structures were obtained without intravenous contrast was attempted. Only diffusion images were acquired due to patient's inability to tolerate the exam. COMPARISON:  Head CT 03/23/2022. FINDINGS: Brain: Acute infarct in the medial left occipital lobe extending into the tail of the left hippocampus and posterior aspect of the left parahippocampal gyrus. No other areas of abnormal restricted diffusion. Vascular: No abnormal diffusion signal. Skull and upper cervical spine: No abnormal diffusion signal. Sinuses/Orbits: No abnormal diffusion signal. Other: None.  IMPRESSION: Only diffusion images were acquired due to the patient's inability to tolerate the exam. 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 No other areas of restricted diffusion. Electronically Signed   By: Emmit Alexanders M.D.   On: 03/23/2022 14:04   CT HEAD WO CONTRAST (5MM)  Result Date: 03/23/2022 CLINICAL DATA:  Delirium EXAM: CT HEAD WITHOUT CONTRAST TECHNIQUE: Contiguous axial  images were obtained from the base of the skull through the vertex without intravenous contrast. RADIATION DOSE REDUCTION: This exam was performed according to the departmental dose-optimization program which includes automated exposure control, adjustment of the mA and/or kV according to patient size and/or use of iterative reconstruction technique. COMPARISON:  CT head 01/21/2019 FINDINGS: Brain: There is hypodensity in the left occipital lobe suspicious for subacute infarct. There is possible trace petechial hemorrhage (3-15) but no mass effect. There is no acute intracranial hemorrhage or extra-axial fluid collection. Parenchymal volume is within expected limits for age. The ventricles are stable in size. Patchy and confluent hypodensity throughout the remainder of the supratentorial white matter likely reflects sequela of chronic small-vessel ischemic change. There is a small remote infarct in the right cerebellar hemisphere. The pituitary and suprasellar region are normal. There is no mass lesion. There is no mass effect or midline shift. Vascular: There is calcification of the bilateral carotid siphons and vertebral arteries. Skull: Normal. Negative for fracture or focal lesion. Sinuses/Orbits: The imaged paranasal sinuses are clear. Bilateral lens implants are in place. The globes and orbits are otherwise unremarkable. Other: None. IMPRESSION: Suspected subacute infarct in the left occipital lobe with possible petechial hemorrhage but no mass effect. Recommend brain MRI for better evaluation. These results will be called to the ordering clinician or representative by the Radiologist Assistant, and communication documented in the PACS or [employer]. Electronically Signed   By: Valetta Mole M.D.   On: 03/23/2022 09:38   DG Chest Port 1 View  Result Date: 03/23/2022 CLINICAL DATA:  Shortness of breath.  Concern for pneumonia. EXAM: PORTABLE CHEST 1 VIEW COMPARISON:  03/22/2022 FINDINGS: Unchanged  cardiac enlargement. Decreased left pleural effusion. Diffuse bilateral interstitial and airspace opacities are again noted. Compared with the previous exam there is been improved aeration to both lower lung zones. IMPRESSION: 1. Persistent bilateral interstitial and airspace opacities with improved aeration to both lower lung zones. 2. Decrease in left pleural effusion. Electronically Signed   By: Kerby Moors M.D.   On: 03/23/2022 06:29   DG Chest Portable 1 View  Result Date: 03/22/2022 CLINICAL DATA:  Lethargic.  Decreasing blood pressure EXAM: PORTABLE CHEST 1 VIEW COMPARISON:  03/15/2022 x-ray and older FINDINGS: Enlarged cardiopericardial silhouette with increasing edema and perihilar opacities. Small effusions, left-greater-than-right. No pneumothorax. Tortuous and ectatic aorta. Overlapping cardiac leads. IMPRESSION: Developing bilateral opacities with effusions, possible edema. Secondary infiltrate is possible. No pneumothorax. Enlarged cardiopericardial silhouette. Electronically Signed   By: Jill Side M.D.   On: 03/22/2022 16:20   DG CHEST PORT 1 VIEW  Result Date: 03/15/2022 CLINICAL DATA:  Follow-up exam. EXAM: PORTABLE CHEST 1 VIEW COMPARISON:  CXR 03/08/22 FINDINGS: No pleural effusion. No pneumothorax. Unchanged enlarged cardiac contours. Redemonstrated bilateral perihilar pulmonary opacities, improved from prior exam, and possibly suggestive improving infection or pulmonary edema. No displaced rib fractures. Visualized upper abdomen is unremarkable. IMPRESSION: Redemonstrated bilateral perihilar pulmonary opacities, improved from prior exam, and possibly suggestive of improving infection or pulmonary edema. Electronically Signed   By: Marin Roberts M.D.   On: 03/15/2022 10:12  CARDIAC CATHETERIZATION  Result Date: 03/14/2022   Mid LAD lesion is 85% stenosed.   Dist Cx lesion is 60% stenosed.   Prox Cx to Mid Cx lesion is 20% stenosed.   1st Diag lesion is 70% stenosed.   A  drug-eluting stent was successfully placed.   Post intervention, there is a 0% residual stenosis. Successful percutaneous coronary intervention to an 85 to 90% mildly tortuous mid LAD stenosis rated with PTCA, Cutting Balloon, and ultimate stenting with a 3.0 x 18 mm Onyx Frontier DES stent postdilated to 3.25 mm with the stenosis being reduced to 0%. RECOMMENDATION: DAPT for minimum of 1 year.  Medical therapy for concomitant CAD.  The patient is on peritoneal dialysis.  Aggressive lipid-lowering therapy with target LDL less than 55.   CARDIAC CATHETERIZATION  Result Date: 03/12/2022 Conclusions: Multivessel coronary artery disease, as detailed below.  Most severe lesion is an 80-90% mid LAD stenosis.  There is also moderate to severe disease involving small branches (D1 and rPDA) as well as moderate stenosis affecting distal LCx and rPLAV. Moderately elevated left ventricular filling pressure (LVEDP 25-30 mmHg). Aborted attempt at PCI of the mid LAD due to inability to achieve therapeutic anticoagulation despite administration of 16,000 unit of IV heparin. Recommendations: Consider PCI to mid LAD as soon as tomorrow using alternative intraprocedural anticoagulation strategy (i.e. bivalirudin). Dual antiplatelet therapy with aspirin and clopidogrel for at least 12 months. Consider escalation of fluid removal with peritoneal dialysis in the setting of moderately elevated LVEDP. Aggressive secondary prevention of coronary artery disease. Nelva Bush, MD [facility]  CT CHEST ABDOMEN PELVIS WO CONTRAST  Result Date: 03/08/2022 CLINICAL DATA:  82 year old with shortness of breath and abdominal pain. End-stage renal disease on peritoneal dialysis. EXAM: CT CHEST, ABDOMEN AND PELVIS WITHOUT CONTRAST TECHNIQUE: Multidetector CT imaging of the chest, abdomen and pelvis was performed following the standard protocol without IV contrast. RADIATION DOSE REDUCTION: This exam was performed according to the  departmental dose-optimization program which includes automated exposure control, adjustment of the mA and/or kV according to patient size and/or use of iterative reconstruction technique. COMPARISON:  Chest radiograph 03/08/2022 and CT chest abdomen and pelvis 01/21/2019 FINDINGS: CT CHEST FINDINGS Cardiovascular: Extensive coronary artery calcifications. Heart size is slightly enlarged. No significant pericardial effusion. Normal caliber of the thoracic aorta with atherosclerotic calcifications. Mediastinum/Nodes: Prominent mediastinal lymph nodes. Right paratracheal lymph node on sequence 3 image 19 measures up to 1.5 cm in the short axis and previously measured 0.7 cm. Limited evaluation for hilar lymph node enlargement due to the lack of intravascular contrast. No axillary lymph node enlargement. Lungs/Pleura: Small left pleural effusion and moderate sized right pleural effusion. Patchy confluent airspace densities in both lungs, most prominent in the upper lobes. Findings are suggestive for multifocal pneumonia. Musculoskeletal: No acute bone abnormality. CT ABDOMEN PELVIS FINDINGS Hepatobiliary: Small amount of perihepatic ascites. Cholecystectomy. No gross abnormality to the liver. Pancreas: Unremarkable. No pancreatic ductal dilatation or surrounding inflammatory changes. Spleen: Limited evaluation due to motion artifact. Small amount of perisplenic ascites. Adrenals/Urinary Tract: Normal appearance of the adrenal glands. Both kidneys are atrophic without hydronephrosis. Normal appearance of the urinary bladder containing fluid. Small renal lesions likely represent cysts and do not require dedicated follow-up. Stomach/Bowel: Diverticula involving the duodenum near the ampulla. No evidence for bowel dilatation or obstruction. Colonic diverticula without acute bowel inflammation. Vascular/Lymphatic: Diffuse atherosclerotic calcifications in the abdominal aorta and iliac arteries. Negative for abdominal aortic  aneurysm. No lymph node enlargement in the abdomen  or pelvis. Reproductive: Prostate is prominent for size. Normal appearance of seminal vesicles. Other: Peritoneal dialysis catheter is coiled in the right anterior pelvis. Small amount of ascites or free fluid in the pelvis. Negative for free air. Again noted is a lobulated soft tissue structure containing fat in the right anterior abdomen measuring up to 7.5 cm on image 73/3. This is a chronic finding and probably represent prior inflammation or infarction in the omentum. Left inguinal hernia containing fat. Musculoskeletal: No acute bone abnormality. IMPRESSION: 1. Patchy airspace disease in both lungs is suggestive for multifocal pneumonia. 2. Bilateral pleural effusions, right side greater than left. 3. Small amount of ascites associated with the peritoneal dialysis catheter. 4. Prominent mediastinal lymph nodes are likely reactive but nonspecific. 5. Aortic Atherosclerosis (ICD10-I70.0). 6. 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 Electronically Signed   By: Markus Daft M.D.   On: 03/08/2022 17:06   DG Abd Portable 1V  Result Date: 03/08/2022 CLINICAL DATA:  Dyspnea EXAM: PORTABLE ABDOMEN - 1 VIEW COMPARISON:  03/04/2022 FINDINGS: Bowel gas pattern is nonspecific. Peritoneal dialysis catheter is seen with its tip in the right side of pelvis. Arterial calcifications are seen, especially prominent in tortuous splenic artery. Surgical clips are seen in right upper quadrant. IMPRESSION: Nonspecific bowel gas pattern. Electronically Signed   By: Elmer Picker M.D.   On: 03/08/2022 16:02   DG CHEST PORT 1 VIEW  Result Date: 03/08/2022 CLINICAL DATA:  Dyspnea EXAM: PORTABLE CHEST 1 VIEW COMPARISON:  Previous studies including the examination of 03/07/2022 FINDINGS: Transverse diameter of heart is increased. There is interval worsening of pulmonary vascular congestion. Increased interstitial and  alveolar markings are seen in parahilar regions and lower lung fields. Lateral CP angles are clear. There is no pneumothorax. IMPRESSION: Cardiomegaly. Central pulmonary vessels are prominent suggesting CHF. Possibility of underlying pneumonia is not excluded. Electronically Signed   By: Elmer Picker M.D.   On: 03/08/2022 16:01   DG CHEST PORT 1 VIEW  Result Date: 03/07/2022 CLINICAL DATA:  Acute respiratory failure EXAM: PORTABLE CHEST 1 VIEW COMPARISON:  03/06/2022 FINDINGS: Stable perihilar airspace opacities. Borderline enlargement of the cardiopericardial silhouette. Minimal blunting of the left lateral costophrenic angle. Right lateral seventh rib deformity compatible with age-indeterminate fracture. Atherosclerotic calcification of the aortic arch. IMPRESSION: 1. Stable perihilar airspace opacities favoring pulmonary edema over pneumonia. 2. Borderline enlargement of the cardiopericardial silhouette. 3. Right lateral seventh rib deformity compatible with age-indeterminate fracture. 4. Minimal blunting of the left lateral costophrenic angle, possibly from a trace left pleural effusion or pleural thickening. Electronically Signed   By: Van Clines M.D.   On: 03/07/2022 09:34   DG CHEST PORT 1 VIEW  Result Date: 03/06/2022 CLINICAL DATA:  Shortness of breath. EXAM: PORTABLE CHEST 1 VIEW COMPARISON:  03/04/2022 FINDINGS: Interval removal of left IJ catheter, endotracheal tube catheter endotracheal tube, and enteric tube. Stable cardiac enlargement. Multifocal airspace disease throughout the right lung is unchanged from previous exam. New left upper lobe airspace opacity. Partial improved aeration to the left lung base. Small pleural effusions suspected. IMPRESSION: 1. Interval removal of support apparatus. 2. New left upper lobe airspace opacity. 3. Improved aeration to the left lung base. Electronically Signed   By: Kerby Moors M.D.   On: 03/06/2022 18:13   DG CHEST PORT 1 VIEW  Result  Date: 03/05/2022 CLINICAL DATA:  Respiratory failure EXAM: PORTABLE CHEST 1 VIEW COMPARISON:  03/04/2022 FINDINGS: Diffuse opacity right  hemithorax again noted. Left base consolidation. Vascular congestion. Probable left-sided pleural effusion. No pneumothorax identified. NG tube below the diaphragm and off x-ray. Endotracheal tube tip just below thoracic inlet. Left IJ CVC tip distal SVC. IMPRESSION: Diffuse opacity right hemithorax and left base consolidation or volume loss. Electronically Signed   By: Sammie Bench M.D.   On: 03/05/2022 11:11   DG Abd 1 View  Result Date: 03/04/2022 CLINICAL DATA:  Peritoneal dialysis EXAM: ABDOMEN - 1 VIEW COMPARISON:  03/03/2022 FINDINGS: Enteric tube is coiled within the stomach. Tip of the peritoneal dialysis catheter is coiled within the low right pelvis. Nonobstructive bowel gas pattern. Cholecystectomy clips. Calcified splenic artery. IMPRESSION: Tip of the peritoneal dialysis catheter is coiled within the low right pelvis. Electronically Signed   By: Davina Poke D.O.   On: 03/04/2022 14:12   DG Chest Port 1 View  Result Date: 03/04/2022 CLINICAL DATA:  Endotracheal tube in-situ. EXAM: PORTABLE CHEST 1 VIEW COMPARISON:  One-view chest x-ray 03/03/2022 FINDINGS: Heart is enlarged. Atherosclerotic changes are present the aortic arch. Endotracheal tube is stable, 5 cm above the carina. Left IJ line terminates at the cavoatrial junction. Diffuse interstitial and airspace opacities are present throughout the right lung, increasing in the right lower lobe. Perihilar opacities bilaterally are similar the prior study. Retrocardiac opacity is increasing. Bilateral pleural effusions are increasing. IMPRESSION: 1. Increasing interstitial and airspace opacities bilaterally consistent with multifocal pneumonia and probable pulmonary edema. 2. Increasing bilateral effusions. 3. Stable support apparatus. Electronically Signed   By: San Morelle M.D.   On:  03/04/2022 09:53   ECHOCARDIOGRAM COMPLETE  Result Date: 03/04/2022    ECHOCARDIOGRAM REPORT   Patient Name:   Ireoluwa Grant Date of Exam: 03/04/2022 Medical Rec #:  [id number]      Height:       72.0 in Accession #:    [id number]     Weight:       190.9 lb Date of Birth:  28-Jul-1940     BSA:          2.089 m Patient Age:    69 years       BP:           98/58 mmHg Patient Gender: M              HR:           51 bpm. Exam Location:  Inpatient Procedure: 2D Echo, Cardiac Doppler and Color Doppler                       STAT ECHO Reported to: Dr Phineas Inches on 03/04/2022 7:47:00 AM. Indications:    Cardiomyopathy-ischemic  History:        Patient has prior history of Echocardiogram examinations, most                 recent 03/29/2011. Risk Factors:Hypertension and Dyslipidemia.                 ESRD.  Sonographer:    Clayton Lefort RDCS (AE) Referring Phys: Frederik Pear  Sonographer Comments: Echo performed with patient supine and on artificial respirator. IMPRESSIONS  1. Left ventricular ejection fraction, by estimation, is 40 to 45%. The left ventricle has mildly decreased function. The left ventricle demonstrates global hypokinesis. There is mild left ventricular hypertrophy. Left ventricular diastolic parameters are consistent with Grade I diastolic dysfunction (impaired relaxation).  2. Right ventricular systolic function is mildly reduced. The right ventricular  size is normal. Tricuspid regurgitation signal is inadequate for assessing PA pressure.  3. Moderate pleural effusion.  4. Mild mitral valve regurgitation.  5. Aortic valve regurgitation is mild.  6. The inferior vena cava is normal in size with <50% respiratory variability, suggesting right atrial pressure of 8 mmHg. Comparison(s): No prior Echocardiogram. FINDINGS  Left Ventricle: Left ventricular ejection fraction, by estimation, is 40 to 45%. The left ventricle has mildly decreased function. The left ventricle demonstrates global hypokinesis. The  left ventricular internal cavity size was normal in size. There is  mild left ventricular hypertrophy. Left ventricular diastolic parameters are consistent with Grade I diastolic dysfunction (impaired relaxation). Right Ventricle: The right ventricular size is normal. Right ventricular systolic function is mildly reduced. Tricuspid regurgitation signal is inadequate for assessing PA pressure. Left Atrium: Left atrial size was normal in size. Right Atrium: Right atrial size was normal in size. Pericardium: There is no evidence of pericardial effusion. Mitral Valve: Mild mitral valve regurgitation. Tricuspid Valve: Tricuspid valve regurgitation is not demonstrated. Aortic Valve: Aortic valve regurgitation is mild. Aortic regurgitation PHT measures 775 msec. Aortic valve mean gradient measures 4.0 mmHg. Aortic valve peak gradient measures 8.0 mmHg. Aortic valve area, by VTI measures 3.31 cm. Pulmonic Valve: Pulmonic valve regurgitation is mild. Aorta: The aortic root and ascending aorta are structurally normal, with no evidence of dilitation. Venous: The inferior vena cava is normal in size with less than 50% respiratory variability, suggesting right atrial pressure of 8 mmHg. IAS/Shunts: The interatrial septum was not well visualized. Additional Comments: There is a moderate pleural effusion.  LEFT VENTRICLE PLAX 2D LVIDd:         6.50 cm      Diastology LVIDs:         4.90 cm      LV e' medial:    3.59 cm/s LV PW:         2.10 cm      LV E/e' medial:  17.0 LV IVS:        1.40 cm      LV e' lateral:   5.44 cm/s LVOT diam:     2.50 cm      LV E/e' lateral: 11.2 LV SV:         95 LV SV Index:   45 LVOT Area:     4.91 cm  LV Volumes (MOD) LV vol d, MOD A2C: 258.0 ml LV vol d, MOD A4C: 186.0 ml LV vol s, MOD A2C: 139.0 ml LV vol s, MOD A4C: 112.0 ml LV SV MOD A2C:     119.0 ml LV SV MOD A4C:     186.0 ml LV SV MOD BP:      100.0 ml RIGHT VENTRICLE            IVC RV Basal diam:  3.20 cm    IVC diam: 2.10 cm RV S prime:      7.72 cm/s TAPSE (M-mode): 1.4 cm LEFT ATRIUM              Index        RIGHT ATRIUM           Index LA diam:        3.60 cm  1.72 cm/m   RA Area:     18.10 cm LA Vol (A2C):   101.0 ml 48.35 ml/m  RA Volume:   47.70 ml  22.84 ml/m LA Vol (A4C):   54.5 ml  26.09 ml/m LA Biplane  Vol: 74.6 ml  35.71 ml/m  AORTIC VALVE AV Area (Vmax):    3.34 cm AV Area (Vmean):   3.54 cm AV Area (VTI):     3.31 cm AV Vmax:           141.00 cm/s AV Vmean:          87.000 cm/s AV VTI:            0.286 m AV Peak Grad:      8.0 mmHg AV Mean Grad:      4.0 mmHg LVOT Vmax:         95.90 cm/s LVOT Vmean:        62.700 cm/s LVOT VTI:          0.193 m LVOT/AV VTI ratio: 0.67 AI PHT:            775 msec  AORTA Ao Root diam: 3.50 cm Ao Asc diam:  3.80 cm MITRAL VALVE MV Area (PHT): 1.96 cm    SHUNTS MV Decel Time: 388 msec    Systemic VTI:  0.19 m MV E velocity: 61.20 cm/s  Systemic Diam: 2.50 cm Phineas Inches Electronically signed by Phineas Inches Signature Date/Time: 03/04/2022/8:04:15 AM    Final    DG Abd 1 View  Result Date: 03/03/2022 CLINICAL DATA:  Orogastric tube placement EXAM: ABDOMEN - 1 VIEW COMPARISON:  08/30/2021 FINDINGS: Distal course of orogastric tube is coiled within the fundus of the stomach. Tip of enteric tube is seen in the region of antrum of the stomach. There are surgical clips in right upper quadrant. Bowel gas pattern in the upper abdomen is unremarkable. Lower abdomen and pelvis are not included in the image. There is a catheter in the mid abdomen suggesting possible peritoneal dialysis catheter. IMPRESSION: Tip of enteric tube is seen in the antrum of the stomach. Electronically Signed   By: Elmer Picker M.D.   On: 03/03/2022 18:48   DG CHEST PORT 1 VIEW  Result Date: 03/03/2022 CLINICAL DATA:  Evaluate endotracheal tube and enteric tube location EXAM: PORTABLE CHEST 1 VIEW COMPARISON:  Previous studies including the examination done earlier today FINDINGS: Transverse diameter of heart is  increased. Tip of endotracheal tube is 5.6 cm above the carina. There is interval placement of left IJ central venous catheter with its tip in superior vena cava. Enteric tube is noted traversing the esophagus. There is large alveolar infiltrate in right upper lung field with possible interval worsening. There are faint alveolar densities in both parahilar regions and lower lung fields suggesting multifocal pneumonia and possibly pulmonary edema. There is blunting of lateral CP angles. There is no pneumothorax. IMPRESSION: Dense alveolar infiltrate is seen in right upper lobe. There are alveolar densities in both parahilar regions and both lower lung fields with possible interval worsening suggesting multifocal pneumonia and possibly pulmonary edema. Small bilateral pleural effusions. Support devices as described in the body of the report. Electronically Signed   By: Elmer Picker M.D.   On: 03/03/2022 18:46   DG CHEST PORT 1 VIEW  Result Date: 03/03/2022 CLINICAL DATA:  Shortness of breath and cough. EXAM: PORTABLE CHEST 1 VIEW COMPARISON:  12/31/2021 FINDINGS: RIGHT UPPER lobe consolidation/airspace opacities noted, stable to slightly increased. New bilateral airspace opacities within the remainder of the lungs noted and may represent pneumonia and/or edema. There may be trace bilateral pleural effusions present. Cardiomediastinal silhouette is not significantly changed. Probable bibasilar atelectasis noted. IMPRESSION: 1. RIGHT UPPER lobe consolidation/airspace opacities, likely representing pneumonia.  2. New bilateral airspace opacities within the remainder of the lungs which may represent pneumonia and/or edema. Electronically Signed   By: Margarette Canada M.D.   On: 03/03/2022 15:05   DG Chest 2 View  Result Date: 03/02/2022 CLINICAL DATA:  Dyspnea EXAM: CHEST - 2 VIEW COMPARISON:  03/11/2021 FINDINGS: There is consolidation within the right upper lobe in keeping with changes of acute lobar  pneumonia in the appropriate clinical setting. Left lung is clear. No pneumothorax or pleural effusion. Cardiac size within normal limits. No acute bone abnormality. IMPRESSION: 1. Right upper lobe pneumonia. Follow-up chest radiograph is recommended in 3-4 weeks to document resolution. Electronically Signed   By: Fidela Salisbury M.D.   On: 03/02/2022 22:00     PHYSICAL EXAM  Temp:  [97.7 F (36.5 C)-98.4 F (36.9 C)] 98.4 F (36.9 C) (01/21 1615) Pulse Rate:  [64-74] 64 (01/21 1800) Resp:  [14-34] 21 (01/21 1800) BP: (104-128)/(66-74) 123/71 (01/21 1800) SpO2:  [90 %-100 %] 100 % (01/21 1800) Weight:  [80.3 kg] 80.3 kg (01/21 1500)  General - Well nourished, well developed, in no apparent distress.  Ophthalmologic - fundi not visualized due to noncooperation.  Cardiovascular - Regular rhythm and rate.  Neuro - awake, alert, eyes open, orientated to people, but told me his age on the second try, not orientated to place (said clinic) or time. No aphasia, paucity of speech but no dysarthria, following all simple commands. Able to name and repeat. No gaze palsy, right upper quadrantanopia. Mild right facial droop. Tongue midline. Bilateral UEs 5/5, no drift. Bilaterally LEs 5/5, no drift. Sensation symmetrical bilaterally, b/l FTN intact, gait not tested.     ASSESSMENT/PLAN Mr. Cheveyo Virginia is a 83 y.o. male with history of ESRD on PD, hyperlipidemia, CAD with recent non-STEMI and status post stenting, CHF admitted for generalized weakness, confusion, decreased p.o. intake. No tPA given due to altered mental.    Stroke:  left PCA infarct with petechial hemorrhage embolic pattern secondary to cryptogenic source CT left PCA infarct with petechial hemorrhage MRI confirmed left PCA infarct with petechial hemorrhage MRA pending Carotid Doppler unremarkable 2D Echo EF 40 to 45% LE venous Doppler showed left popliteal vein and peroneal vein acute DVT TCD bubble study no PFO LDL 32 HgbA1c  5.5 Eliquis for VTE prophylaxis aspirin 81 mg daily and clopidogrel 75 mg daily prior to admission, now on Eliquis (apixaban) daily and Plavix.  Continue on discharge. Once off [facility], may consider long term cardiac monitoring to rule out afib.  Ongoing aggressive stroke risk factor management Therapy recommendations: Home health Disposition: Pending  DVT LE venous Doppler showed left popliteal vein and peroneal vein acute DVT On eliquis   CAD with NSTEMI Admitted for NSTEMI on 12/31  S/p cardiac stenting on 1/10 Was on DAPT Due to eliquis, currently on plavix.   ESRD on PD at home nephrology on board Will need CRRT in ICU with central catheter placed.  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 Please call with questions. Pt will follow up with stroke clinic NP at [facility] in about 4 weeks. Thanks for the consult.   Rosalin Hawking, MD PhD Stroke Neurology 03/25/2022 7:03 PM    To contact  Stroke Continuity provider, please refer to [email or web address] After hours, contact General Neurology

## 2022-03-25 NOTE — Progress Notes (Deleted)
Cardiology Office Note:    Date:  03/25/2022   ID:  William [name], DOB 19-Sep-1940, MRN [id number]  PCP:  William [name], [place] [facility] Cardiologist:  William Mayo, MD { Click to update primary MD,subspecialty MD or APP then REFRESH:1}    Referring MD: William [name]*   No chief complaint on file. ***  History of Present Illness:    William [name] is a 82 y.o. male with a hx of ESRD on PD, CAD status post NSTEMI on 03/04/2022 with DES to LAD, chronic combined systolic and diastolic heart failure    Past Medical History:  Diagnosis Date   Aortic insufficiency    Coronary artery disease    ESRD on peritoneal dialysis ([place])    Hyperlipidemia    Hypertension     Past Surgical History:  Procedure Laterality Date   APPENDECTOMY     CARDIOVASCULAR STRESS TEST  06/08/2009   EF 43%   CHOLECYSTECTOMY     CORONARY STENT INTERVENTION N/A 03/14/2022   Procedure: CORONARY STENT INTERVENTION;  Surgeon: William Sine, MD;  Location: [facility];  Service: Cardiovascular;  Laterality: N/A;   FOOT SURGERY     HERNIA REPAIR     LEFT HEART CATH AND CORONARY ANGIOGRAPHY N/A 03/12/2022   Procedure: LEFT HEART CATH AND CORONARY ANGIOGRAPHY;  Surgeon: William Bush, MD;  Location: [facility];  Service: Cardiovascular;  Laterality: N/A;   US ECHOCARDIOGRAPHY  05/24/2009   EF 60%    Current Medications: No outpatient medications have been marked as taking for the 03/26/22 encounter (Appointment) with William Pelton, NP.     Allergies:   Nsaids, Valacyclovir, and Lisinopril   Social History   Socioeconomic History   Marital status: Married    Spouse name: Not on file   Number of children: 4   Years of education: Not on file   Highest education level: Not on file  Occupational History   Occupation: general Marine scientist: RETIRED  Tobacco Use   Smoking status: Former    Types: Cigarettes    Quit date:  03/05/1966    Years since quitting: 56.0   Smokeless tobacco: Never  Substance and Sexual Activity   Alcohol use: No   Drug use: No   Sexual activity: Not on file  Other Topics Concern   Not on file  Social History Narrative   Not on file   Social Determinants of Health   Financial Resource Strain: Not on file  Food Insecurity: No Food Insecurity (03/14/2022)   Hunger Vital Sign    Worried About Running Out of Food in the Last Year: Never true    Ran Out of Food in the Last Year: Never true  Transportation Needs: No Transportation Needs (03/14/2022)   PRAPARE - Hydrologist (Medical): No    Lack of Transportation (Non-Medical): No  Physical Activity: Not on file  Stress: Not on file  Social Connections: Not on file     Family History: The patient's ***family history includes Cancer in his mother; Cirrhosis in his father; Diabetes in his brother.  ROS:   Please see the history of present illness.    *** All other systems reviewed and are negative.  EKGs/Labs/Other Studies Reviewed:    The following studies were reviewed today: ***  EKG:  EKG is *** ordered today.  The ekg ordered today demonstrates ***  Recent Labs: 03/22/2022: ALT  42 03/23/2022: TSH 1.029 03/25/2022: B Natriuretic Peptide >4,500.0; BUN 87; Creatinine, Ser 9.24; Hemoglobin 9.6; Magnesium 2.3; Platelets 258; Potassium 4.0; Sodium 138  Recent Lipid Panel    Component Value Date/Time   CHOL 86 03/23/2022 0649   TRIG 85 03/23/2022 0649   HDL 37 (L) 03/23/2022 0649   CHOLHDL 2.3 03/23/2022 0649   VLDL 17 03/23/2022 0649   LDLCALC 32 03/23/2022 0649     Risk Assessment/Calculations:   {Does this patient have ATRIAL FIBRILLATION?:[phone]}            Physical Exam:    VS:  There were no vitals taken for this visit.    Wt Readings from Last 3 Encounters:  03/25/22 177 lb 0.5 oz (80.3 kg)  03/13/22 167 lb 15.9 oz (76.2 kg)  02/25/19 185 lb (83.9 kg)     GEN: ***  Well nourished, well developed in no acute distress HEENT: Normal NECK: No JVD; No carotid bruits LYMPHATICS: No lymphadenopathy CARDIAC: ***RRR, no murmurs, rubs, gallops RESPIRATORY:  Clear to auscultation without rales, wheezing or rhonchi  ABDOMEN: Soft, non-tender, non-distended MUSCULOSKELETAL:  No edema; No deformity  SKIN: Warm and dry NEUROLOGIC:  Alert and oriented x 3 PSYCHIATRIC:  Normal affect   ASSESSMENT:    No diagnosis found. PLAN:    In order of problems listed above:  ***      {Are you ordering a CV Procedure (e.g. stress test, cath, DCCV, TEE, etc)?   Press F2        :[id number]    Medication Adjustments/Labs and Tests Ordered: Current medicines are reviewed at length with the patient today.  Concerns regarding medicines are outlined above.  No orders of the defined types were placed in this encounter.  No orders of the defined types were placed in this encounter.   There are no Patient Instructions on file for this visit.   Signed, William Ida, NP  03/25/2022 5:37 PM    [place]

## 2022-03-25 NOTE — Procedures (Signed)
Central Venous Catheter Insertion Procedure Note  William [name]  [id number]  24-Jan-1941  Date:03/25/22  Time:6:02 PM   Provider Performing:Morayma Godown   Procedure: Insertion of Non-tunneled Central Venous Catheter(36556)with US guidance (05697)    Indication(s) Hemodialysis  Consent Risks of the procedure as well as the alternatives and risks of each were explained to the patient and/or caregiver.  Consent for the procedure was obtained and is signed in the bedside chart  Anesthesia Topical only with 1% lidocaine   Timeout Verified patient identification, verified procedure, site/side was marked, verified correct patient position, special equipment/implants available, medications/allergies/relevant history reviewed, required imaging and test results available.  Sterile Technique Maximal sterile technique including full sterile barrier drape, hand hygiene, sterile gown, sterile gloves, mask, hair covering, sterile ultrasound probe cover (if used).  Procedure Description Area of catheter insertion was cleaned with chlorhexidine and draped in sterile fashion.   With real-time ultrasound guidance a HD catheter was placed into the right internal jugular vein.  Nonpulsatile blood flow and easy flushing noted in all ports.  The catheter was sutured in place and sterile dressing applied.  Complications/Tolerance None; patient tolerated the procedure well. Chest X-ray is ordered to verify placement for internal jugular or subclavian cannulation.  Chest x-ray is not ordered for femoral cannulation.  EBL Minimal  Specimen(s) None

## 2022-03-25 NOTE — Progress Notes (Addendum)
Subjective:  Overnight:NAEON  Pleasantly confused this am. Alert to self but states it is 1970s. Not able to recall name of daughter at bedside.   Objective:  Vital signs in last 24 hours: Vitals:   03/24/22 2042 03/24/22 2308 03/25/22 0000 03/25/22 0306  BP:  116/69 109/68 122/74  Pulse: 66 69  70  Resp: '20 20  19  '$ Temp:  97.9 F (36.6 C)    TempSrc:  Oral    SpO2: 90% 100%  100%  Weight:      Height:       Supplemental O2: Nasal Cannula Last BM Date :  (PTA) SpO2: 100 % O2 Flow Rate (L/min): 4 L/min Filed Weights   03/22/22 1557 03/24/22 0900  Weight: 83.9 kg 82.1 kg    Intake/Output Summary (Last 24 hours) at 03/25/2022 0555 Last data filed at 03/24/2022 1514 Gross per 24 hour  Intake 0 ml  Output 65 ml  Net -65 ml   Net IO Since Admission: 551.93 mL [03/25/22 0555]  Physical Exam  General: ill appearing elderly male HENT: NCAT Lungs: bibasilar rales.  Cardiovascular: NSR, good pulses in extremities Abdomen: No TTP, normal bowel sounds MSK: Moving all extremities.  Good strength in the lower extremity.  Neuro: CN II-XII grossly intact.  No focal deficits noted on exam.  Patient alert but oriented to self only.   Psych: Normal mood and normal affect  Diagnostics    Latest Ref Rng & Units 03/25/2022    2:45 AM 03/24/2022    5:54 AM 03/23/2022    6:50 AM  CBC  WBC 4.0 - 10.5 K/uL 8.1  7.3  10.6   Hemoglobin 13.0 - 17.0 g/dL 9.6  9.3  9.0   Hematocrit 39.0 - 52.0 % 30.1  29.2  28.1   Platelets 150 - 400 K/uL 258  223  189        Latest Ref Rng & Units 03/25/2022    2:45 AM 03/24/2022    5:54 AM 03/23/2022    6:50 AM  CMP  Glucose 70 - 99 mg/dL 164  128  109   BUN 8 - 23 mg/dL 87  86  84   Creatinine 0.61 - 1.24 mg/dL 9.24  9.23  9.44   Sodium 135 - 145 mmol/L 138  138  136   Potassium 3.5 - 5.1 mmol/L 4.0  4.4  4.9   Chloride 98 - 111 mmol/L 96  96  95   CO2 22 - 32 mmol/L '22  22  22   '$ Calcium 8.9 - 10.3 mg/dL 7.8  7.7  7.3     Procalcitonin  0.70 CRP 22.1-->16.8 Magnesium 2.3 BNP >4.5k  MR BRAIN WO CONTRAST  Result Date: 03/23/2022 IMPRESSION: Only diffusion images were acquired due to the patient's inability to tolerate the exam. 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 No other areas of restricted diffusion. Electronically Signed   By: Emmit Alexanders M.D.   On: 03/23/2022 14:04   CT HEAD WO CONTRAST (5MM)  Result Date: 03/23/2022 IMPRESSION: Suspected subacute infarct in the left occipital lobe with possible petechial hemorrhage but no mass effect. Recommend brain MRI for better evaluation. These results will be called to the ordering clinician or representative by the Radiologist Assistant, and communication documented in the PACS or [employer]. Electronically Signed   By: Valetta Mole M.D.   On: 03/23/2022 09:38   DG Chest Port 1 View  Result Date: 03/23/2022 CLINICAL DATA:  Shortness of breath.  Concern for pneumonia. EXAM: PORTABLE CHEST 1 VIEW COMPARISON:  03/22/2022 FINDINGS: Unchanged cardiac enlargement. Decreased left pleural effusion. Diffuse bilateral interstitial and airspace opacities are again noted. Compared with the previous exam there is been improved aeration to both lower lung zones. IMPRESSION: 1. Persistent bilateral interstitial and airspace opacities with improved aeration to both lower lung zones. 2. Decrease in left pleural effusion. Electronically Signed   By: Kerby Moors M.D.   On: 03/23/2022 06:29   Assessment/Plan: William [name] is a 82 y.o. male with medical history significant of chronic HFrEF, ESRD on PD, hypertension, hyperlipidemia, BPH recently admitted 12/29-1/11 for acute hypoxic respiratory failure secondary to multifocal pneumonia and was intubated 12/29-1/1 presented to ED with AMS found to have PNA with HF exacerbation  and acute stroke.    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 Secondary to combined pneumonia and HF exacerbation. Procal elevated. Pt on Unasyn day 3. Plan to continue abx. Pt got lasix yesterday and plan to get PD while here to remove fluid. BNP initially >4.5k.   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  Recent history of left heart cath showing multivessel CAD requiring DES to mid LAD on 03/14/2022.   Appears to be in CHF exacerbation again. Recent EF low at 25-30 %. Some intrapulmonary shunting noted. Given low EF and exam with signs of fluid overload and uremia, will request CRRT for fluid removal. Cardiology following.    Metabolic encephalopathy Due to combination of acute hypoxic respiratory failure, CHF and pneumonia.  Worsened by uremic encephalopathy (see plan above). Pt previously has had issues with delirium during his last hospital stay as well requiring Precedex drip, minimize benzodiazepines narcotics, as needed IM Haldol, nighttime Seroquel and monitor. Delirium precautions ordered.    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 MRI shows acute stroke in the left PCA territory. Unable to obtain MRA of the neck yesterday due to encephalopathy. Pt was on DAPT due to recent stent placement. Recent echo shows slight decease in EF at 45 % with G1DD. A1c normal on 03/14/22. Neurology following and plan to  attempt further imaging if mental status allows. Carotid and intracranial vascular ultrasound without any stenosis. Echo with bubble study without any shunt but worsened EF from 45 % to 25 % and LV with global hypokinesis. Acute DVT of LLE seen so pt regimen changed on 03/24/21 to plavix and eliquis with discontinuation of aspirin.   ESRD on home PD.  Appears to have fluid overload.  Nephrology consulted and pt placed on midodrine 5 mg TID. PD with 469 cc output overnight. Given low EF and uremic signs, will request CRRT for fluid removal and toxin clearance.    Dyslipidemia.  Chronic. LDL 32. Continue statin and  Zetia.   Hypertension.  Chronic. Currently on beta-blocker.   Chronic macrocytic anemia.  B12, TSH stable.  PCP to monitor outpatient.   GERD - PPI   Diet: Normal IVF: None,None VTE: Heparin Code: DNR PT/OT recs: Home Health,  Prior to Admission Living Arrangement: Home Anticipated Discharge Location: Home Barriers to Discharge: Medical workup  Dispo: Anticipated discharge in approximately 1-2 day(s).   Idamae Schuller, MD [facility] Res[facility], PGY-2  Pager: [phone] After 5 pm and on weekends: Please call the on-call pager    Attestation -   I have directly reviewed the clinical findings, lab results and imaging studies. I have interviewed and examined the patient and agree with the documentation and management as recorded by the Dr Humphrey Rolls, patient here with acute stroke, fluid overload, recent MI requiring DES stent to mid LAD.  This admission found to be in CHF, new DVT and new stroke, now developing worsening uremia.  Was on home PD but now requires CRRT for fluid removal and to improve his uremia.  Will be moved temporarily to ICU for couple of sessions of CRRT.  Also has new reduction in EF despite recent PCI, cardiology following closely for that.

## 2022-03-25 NOTE — Discharge Instructions (Addendum)
Information on my medicine - ELIQUIS (apixaban)  This medication education was reviewed with me or my healthcare representative as part of my discharge preparation.   PLEASE NOTE: You will stop taking ASPIRIN and continue PLAVIX and ELIQUIS.   Why was Eliquis prescribed for you? Eliquis was prescribed for you to reduce the risk of a blood clot forming that can cause a stroke if you have a medical condition called atrial fibrillation (a type of irregular heartbeat).  What do You need to know about Eliquis ? Take your Eliquis TWICE DAILY - one tablet in the morning and one tablet in the evening with or without food. If you have difficulty swallowing the tablet whole please discuss with your pharmacist how to take the medication safely.  Take Eliquis exactly as prescribed by your doctor and DO NOT stop taking Eliquis without talking to the doctor who prescribed the medication.  Stopping may increase your risk of developing a stroke.  Refill your prescription before you run out.  After discharge, you should have regular check-up appointments with your healthcare provider that is prescribing your Eliquis.  In the future your dose may need to be changed if your kidney function or weight changes by a significant amount or as you get older.  What do you do if you miss a dose? If you miss a dose, take it as soon as you remember on the same day and resume taking twice daily.  Do not take more than one dose of ELIQUIS at the same time to make up a missed dose.  Important Safety Information A possible side effect of Eliquis is bleeding. You should call your healthcare provider right away if you experience any of the following: Bleeding from an injury or your nose that does not stop. Unusual colored urine (red or dark brown) or unusual colored stools (red or black). Unusual bruising for unknown reasons. A serious fall or if you hit your head (even if there is no bleeding).  Some medicines may  interact with Eliquis and might increase your risk of bleeding or clotting while on Eliquis. To help avoid this, consult your healthcare provider or pharmacist prior to using any new prescription or non-prescription medications, including herbals, vitamins, non-steroidal anti-inflammatory drugs (NSAIDs) and supplements.  This website has more information on Eliquis (apixaban): http://www.eliquis.com/eliquis/home

## 2022-03-25 NOTE — Progress Notes (Signed)
PT Cancellation Note  Patient Details Name: William [name] MRN: [id number] DOB: October 18, 1940   Cancelled Treatment:    Reason Eval/Treat Not Completed: Medical issues which prohibited therapy  Moving to ICU today;   Will f/u for appropriateness of PT tomorrow;   Roney Marion, [place] 03/25/2022, 12:09 PM

## 2022-03-25 NOTE — Progress Notes (Signed)
Rounding Note    Patient Name: William [name] Date of Encounter: 03/25/2022  [place] Cardiologist: Janina Mayo, MD   Subjective   Has not been responding to peritoneal dialysis.  Per nephrology, unlikely to tolerate intermittent HD, planning to move to ICU for CRRT.  BP 118/73 this morning.  Remains on 4 L [place].  Remains confused, oriented to person only.  Denies any chest pain  Inpatient Medications    Scheduled Meds:  apixaban  5 mg Oral BID   atorvastatin  20 mg Oral Daily   clopidogrel  75 mg Oral Daily   docusate sodium  100 mg Oral Daily   ezetimibe  10 mg Oral Daily   feeding supplement  237 mL Oral BID BM   midodrine  5 mg Oral TID WC   multivitamin  1 tablet Oral QHS   pantoprazole  40 mg Oral Daily   polyethylene glycol  17 g Oral Daily   sevelamer carbonate  800 mg Oral TID WC   Continuous Infusions:   prismasol BGK 4/2.5      prismasol BGK 4/2.5     ampicillin-sulbactam (UNASYN) IV 3 g (03/24/22 1111)   prismasol BGK 4/2.5     PRN Meds: acetaminophen **OR** acetaminophen, heparin, LORazepam, ondansetron (ZOFRAN) IV, ondansetron   Vital Signs    Vitals:   03/24/22 2308 03/25/22 0000 03/25/22 0306 03/25/22 1045  BP: 116/69 109/68 122/74 118/73  Pulse: 69  70 72  Resp: 20  19 (!) 28  Temp: 97.9 F (36.6 C)   97.7 F (36.5 C)  TempSrc: Oral   Axillary  SpO2: 100%  100% 100%  Weight:      Height:        Intake/Output Summary (Last 24 hours) at 03/25/2022 1116 Last data filed at 03/25/2022 0630 Gross per 24 hour  Intake 0 ml  Output 404 ml  Net -404 ml       03/24/2022    9:00 AM 03/22/2022    3:57 PM 03/13/2022   10:44 AM  Last 3 Weights  Weight (lbs) 181 lb 185 lb 167 lb 15.9 oz  Weight (kg) 82.1 kg 83.915 kg 76.2 kg      Telemetry    NSR - Personally Reviewed  ECG    No new EKG - Personally Reviewed  Physical Exam   GEN: No acute distress.   Neck: +JVD Cardiac: RRR, no murmurs Respiratory: Bibasilar crackles GI:  Soft, nontender MS: No edema Neuro:  Nonfocal  Psych: Normal affect   Labs    High Sensitivity Troponin:   Recent Labs  Lab 03/08/22 0917 03/08/22 1520 03/08/22 1850 03/22/22 1759 03/22/22 2341  TROPONINIHS 2,560* 1,534* 1,415* 153* 143*      Chemistry Recent Labs  Lab 03/22/22 1557 03/22/22 1605 03/23/22 0650 03/24/22 0554 03/25/22 0245  NA 135   < > 136 138 138  K 4.7   < > 4.9 4.4 4.0  CL 94*  --  95* 96* 96*  CO2 25  --  '22 22 22  '$ GLUCOSE 169*  --  109* 128* 164*  BUN 76*  --  84* 86* 87*  CREATININE 8.52*  --  9.44* 9.23* 9.24*  CALCIUM 7.3*  --  7.3* 7.7* 7.8*  MG  --   --  2.1 2.2 2.3  PROT 6.3*  --   --   --   --   ALBUMIN 2.6*  --   --   --   --  AST 34  --   --   --   --   ALT 42  --   --   --   --   ALKPHOS 70  --   --   --   --   BILITOT 0.7  --   --   --   --   GFRNONAA 6*  --  5* 5* 5*  ANIONGAP 16*  --  19* 20* 20*   < > = values in this interval not displayed.     Lipids  Recent Labs  Lab 03/23/22 0649  CHOL 86  TRIG 85  HDL 37*  LDLCALC 32  CHOLHDL 2.3     Hematology Recent Labs  Lab 03/23/22 0650 03/24/22 0554 03/25/22 0245  WBC 10.6* 7.3 8.1  RBC 2.69* 2.77* 2.86*  HGB 9.0* 9.3* 9.6*  HCT 28.1* 29.2* 30.1*  MCV 104.5* 105.4* 105.2*  MCH 33.5 33.6 33.6  MCHC 32.0 31.8 31.9  RDW 14.8 14.7 14.7  PLT 189 223 258    Thyroid  Recent Labs  Lab 03/23/22 0650  TSH 1.029     BNP Recent Labs  Lab 03/23/22 0650 03/24/22 0554 03/25/22 0245  BNP >4,500.0* >4,500.0* >4,500.0*     DDimer No results for input(s): "DDIMER" in the last 168 hours.   Radiology    ECHOCARDIOGRAM LIMITED BUBBLE STUDY  Result Date: 03/24/2022    ECHOCARDIOGRAM LIMITED REPORT   Patient Name:   William [name] Date of Exam: 03/24/2022 Medical Rec #:  [id number]      Height:       72.0 in Accession #:    [id number]     Weight:       181.0 lb Date of Birth:  [birth year]     BSA:          2.042 m Patient Age:    82 years       BP:           114/76  mmHg Patient Gender: M              HR:           75 bpm. Exam Location:  Inpatient Procedure: Limited Echo and Limited Color Doppler Indications:    Stroke I63.9  History:        Patient has prior history of Echocardiogram examinations, most                 recent 03/04/2022. CHF, Previous Myocardial Infarction and CAD;                 Risk Factors:Hypertension and Dyslipidemia. ESRD.  Sonographer:    Ronny Flurry Referring Phys: [place]  1. Left ventricular ejection fraction, by estimation, is 25 to 30%. The left ventricle has severely decreased function. The left ventricle demonstrates global hypokinesis. The left ventricular internal cavity size was severely dilated. There is mild left ventricular hypertrophy. Left ventricular diastolic parameters are indeterminate.  2. Right ventricular systolic function is moderately reduced. The right ventricular size is mildly enlarged. Tricuspid regurgitation signal is inadequate for assessing PA pressure.  3. The mitral valve is abnormal. Moderate mitral valve regurgitation. Appears functional  4. The aortic valve was not well visualized. Aortic valve regurgitation is mild.  5. Aortic dilatation noted. Aneurysm of the aortic root, measuring 48 mm.  6. The inferior vena cava is dilated in size with <50% respiratory variability, suggesting right atrial pressure of 15 mmHg.  7. Agitated saline  contrast bubble study was positive with shunting observed after >6 cardiac cycles suggestive of intrapulmonary shunting. FINDINGS  Left Ventricle: Left ventricular ejection fraction, by estimation, is 25 to 30%. The left ventricle has severely decreased function. The left ventricle demonstrates global hypokinesis. The left ventricular internal cavity size was severely dilated. There is mild left ventricular hypertrophy. Left ventricular diastolic parameters are indeterminate. Right Ventricle: The right ventricular size is mildly enlarged. Right ventricular  systolic function is moderately reduced. Tricuspid regurgitation signal is inadequate for assessing PA pressure. Mitral Valve: The mitral valve is abnormal. Moderate mitral valve regurgitation. Aortic Valve: The aortic valve was not well visualized. Aortic valve regurgitation is mild. Aorta: Aortic dilatation noted. There is an aneurysm involving the aortic root measuring 48 mm. Venous: The inferior vena cava is dilated in size with less than 50% respiratory variability, suggesting right atrial pressure of 15 mmHg. IAS/Shunts: Agitated saline contrast bubble study was positive with shunting observed after >6 cardiac cycles suggestive of intrapulmonary shunting. LEFT VENTRICLE PLAX 2D LVIDd:         6.90 cm   Diastology LVIDs:         6.50 cm   LV e' medial:  3.45 cm/s LV PW:         1.10 cm   LV e' lateral: 5.40 cm/s LV IVS:        0.90 cm LVOT diam:     2.10 cm LVOT Area:     3.46 cm  RIGHT VENTRICLE             IVC RV S prime:     11.90 cm/s  IVC diam: 2.30 cm LEFT ATRIUM         Index LA diam:    5.10 cm 2.50 cm/m   AORTA Ao Root diam: 4.80 cm Ao Asc diam:  3.60 cm  SHUNTS Systemic Diam: 2.10 cm Oswaldo Milian MD Electronically signed by Oswaldo Milian MD Signature Date/Time: 03/24/2022/5:26:35 PM    Final    VAS [place] TRANSCRANIAL DOPPLER W BUBBLES  Result Date: 03/24/2022  Transcranial Doppler with Bubble Patient Name:  BEECHER FURIO  Date of Exam:   03/24/2022 Medical Rec #: [id number]       Accession #:    [id number] Date of Birth: 1940/06/15      Patient Gender: M Patient Age:   31 years Exam Location:  [facility] Procedure:      VAS [place] TRANSCRANIAL [place] Referring Phys: Cornelius Moras XU --------------------------------------------------------------------------------  Indications: Stroke. History: Positive for lower extremity DVT. Performing Technologist: Darlin Coco RDMS, RVT  Examination Guidelines: A complete evaluation includes B-mode imaging, spectral Doppler, color Doppler,  and power Doppler as needed of all accessible portions of each vessel. Bilateral testing is considered an integral part of a complete examination. Limited examinations for reoccurring indications may be performed as noted.  Summary: No HITS at rest or during Valsalva. Negative transcranial Doppler Bubble study with no evidence of right to left intracardiac communication.  A vascular evaluation was performed. The right middle cerebral artery was studied. An IV was inserted into the patient's left antecubital. Verbal informed consent was obtained.  *See table(s) above for TCD measurements and observations.  Diagnosing physician: Rosalin Hawking MD Electronically signed by Rosalin Hawking MD on 03/24/2022 at 5:16:55 PM.    Final    VAS US CAROTID  Result Date: 03/24/2022 Carotid Arterial Duplex Study Patient Name:  DAEMION MCNIEL  Date of Exam:   03/24/2022 Medical Rec #: [id number]  Accession #:    [id number] Date of Birth: 1941/01/23      Patient Gender: M Patient Age:   50 years Exam Location:  [facility] Procedure:      VAS US CAROTID Referring Phys: Deno Etienne [facility] --------------------------------------------------------------------------------  Indications:       CVA. Risk Factors:      Hypertension, hyperlipidemia, past history of smoking,                    coronary artery disease. Other Factors:     ESRD. Comparison Study:  No prior studies. Performing Technologist: Darlin Coco RDMS, RVT  Examination Guidelines: A complete evaluation includes B-mode imaging, spectral Doppler, color Doppler, and power Doppler as needed of all accessible portions of each vessel. Bilateral testing is considered an integral part of a complete examination. Limited examinations for reoccurring indications may be performed as noted.  Right Carotid Findings: +----------+--------+--------+--------+------------------+------------------+           PSV cm/sEDV cm/sStenosisPlaque DescriptionComments            +----------+--------+--------+--------+------------------+------------------+ CCA Prox  65      10                                                   +----------+--------+--------+--------+------------------+------------------+ CCA Distal39      6                                 intimal thickening +----------+--------+--------+--------+------------------+------------------+ ICA Prox  56      14                                                   +----------+--------+--------+--------+------------------+------------------+ ICA Mid   83      27                                                   +----------+--------+--------+--------+------------------+------------------+ ICA Distal75      20                                                   +----------+--------+--------+--------+------------------+------------------+ ECA       38                                                           +----------+--------+--------+--------+------------------+------------------+ +----------+--------+-------+----------------+-------------------+           PSV cm/sEDV cmsDescribe        Arm Pressure (mmHG) +----------+--------+-------+----------------+-------------------+ [id number]             Multiphasic, WNL                    +----------+--------+-------+----------------+-------------------+ +---------+--------+--+--------+--+---------+ VertebralPSV cm/s48EDV  cm/s10Antegrade +---------+--------+--+--------+--+---------+  Left Carotid Findings: +----------+--------+--------+--------+------------------+------------------+           PSV cm/sEDV cm/sStenosisPlaque DescriptionComments           +----------+--------+--------+--------+------------------+------------------+ CCA Prox  87      11                                                   +----------+--------+--------+--------+------------------+------------------+ CCA Distal53      9                                  intimal thickening +----------+--------+--------+--------+------------------+------------------+ ICA Prox  53      16                                tortuous           +----------+--------+--------+--------+------------------+------------------+ ICA Distal106     23                                                   +----------+--------+--------+--------+------------------+------------------+ ECA       55                                                           +----------+--------+--------+--------+------------------+------------------+ +----------+--------+--------+----------------+-------------------+           PSV cm/sEDV cm/sDescribe        Arm Pressure (mmHG) +----------+--------+--------+----------------+-------------------+ [id number]              Multiphasic, WNL                    +----------+--------+--------+----------------+-------------------+ +---------+--------+--+--------+--+---------+ VertebralPSV cm/s40EDV cm/s14Antegrade +---------+--------+--+--------+--+---------+   Summary: Right Carotid: The extracranial vessels were near-normal with only minimal wall                thickening or plaque. Left Carotid: The extracranial vessels were near-normal with only minimal wall               thickening or plaque. Vertebrals:  Bilateral vertebral arteries demonstrate antegrade flow. Subclavians: Normal flow hemodynamics were seen in bilateral subclavian              arteries. *See table(s) above for measurements and observations.     Preliminary    VAS [place] LOWER EXTREMITY VENOUS (DVT)  Result Date: 03/24/2022  Lower Venous DVT Study Patient Name:  KAMARIUS BUCKBEE  Date of Exam:   03/24/2022 Medical Rec #: [id number]       Accession #:    [id number] Date of Birth: 1940-12-13      Patient Gender: M Patient Age:   8 years Exam Location:  [facility] Procedure:      VAS [place] LOWER EXTREMITY VENOUS (DVT) Referring Phys: Cornelius Moras XU  --------------------------------------------------------------------------------  Indications: Stroke.  Risk Factors: None identified. Limitations: Poor ultrasound/tissue interface and patient positioning, patient movement. Comparison Study: No prior studies. Performing Technologist: Oliver Hum RVT  Examination Guidelines: A complete evaluation includes B-mode imaging, spectral Doppler, color Doppler, and power Doppler as needed of all accessible portions of each vessel. Bilateral testing is considered an integral part of a complete examination. Limited examinations for reoccurring indications may be performed as noted. The reflux portion of the exam is performed with the patient in reverse Trendelenburg.  +---------+---------------+---------+-----------+----------+--------------+ RIGHT    CompressibilityPhasicitySpontaneityPropertiesThrombus Aging +---------+---------------+---------+-----------+----------+--------------+ CFV      Full           Yes      Yes                                 +---------+---------------+---------+-----------+----------+--------------+ SFJ      Full                                                        +---------+---------------+---------+-----------+----------+--------------+ FV Prox  Full                                                        +---------+---------------+---------+-----------+----------+--------------+ FV Mid   Full                                                        +---------+---------------+---------+-----------+----------+--------------+ FV DistalFull                                                        +---------+---------------+---------+-----------+----------+--------------+ PFV      Full                                                        +---------+---------------+---------+-----------+----------+--------------+ POP      Full           Yes      Yes                                  +---------+---------------+---------+-----------+----------+--------------+ PTV      Full                                                        +---------+---------------+---------+-----------+----------+--------------+ PERO     Full                                                        +---------+---------------+---------+-----------+----------+--------------+  SSV      None                                         Acute          +---------+---------------+---------+-----------+----------+--------------+   +---------+---------------+---------+-----------+----------+--------------+ LEFT     CompressibilityPhasicitySpontaneityPropertiesThrombus Aging +---------+---------------+---------+-----------+----------+--------------+ CFV      Full           Yes      Yes                                 +---------+---------------+---------+-----------+----------+--------------+ SFJ      Full                                                        +---------+---------------+---------+-----------+----------+--------------+ FV Prox  Full                                                        +---------+---------------+---------+-----------+----------+--------------+ FV Mid   Full                                                        +---------+---------------+---------+-----------+----------+--------------+ FV DistalFull                                                        +---------+---------------+---------+-----------+----------+--------------+ PFV      Full                                                        +---------+---------------+---------+-----------+----------+--------------+ POP      None           No       No                   Acute          +---------+---------------+---------+-----------+----------+--------------+ PTV      Full                                                         +---------+---------------+---------+-----------+----------+--------------+ PERO     None                                         Acute          +---------+---------------+---------+-----------+----------+--------------+  Summary: RIGHT: - Findings consistent with acute superficial vein thrombosis involving the right small saphenous vein. - There is no evidence of deep vein thrombosis in the lower extremity.  - No cystic structure found in the popliteal fossa.  LEFT: - Findings consistent with acute deep vein thrombosis involving the left popliteal vein, and left peroneal veins. - No cystic structure found in the popliteal fossa.  *See table(s) above for measurements and observations.    Preliminary    MR BRAIN WO CONTRAST  Result Date: 03/23/2022 CLINICAL DATA:  Neuro deficit, acute, stroke suspected.  Weakness. EXAM: MRI HEAD WITHOUT CONTRAST TECHNIQUE: Multiplanar, multiecho pulse sequences of the brain and surrounding structures were obtained without intravenous contrast was attempted. Only diffusion images were acquired due to patient's inability to tolerate the exam. COMPARISON:  Head CT 03/23/2022. FINDINGS: Brain: Acute infarct in the medial left occipital lobe extending into the tail of the left hippocampus and posterior aspect of the left parahippocampal gyrus. No other areas of abnormal restricted diffusion. Vascular: No abnormal diffusion signal. Skull and upper cervical spine: No abnormal diffusion signal. Sinuses/Orbits: No abnormal diffusion signal. Other: None. IMPRESSION: Only diffusion images were acquired due to the patient's inability to tolerate the exam. 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 No other areas of restricted diffusion. Electronically Signed   By: Emmit Alexanders M.D.   On: 03/23/2022 14:04    Cardiac Studies     Patient Profile     82 y.o. male with history of ESRD on PD, CAD status post NSTEMI 03/04/2022 with DES to LAD, chronic combined systolic and  diastolic heart failure we are consulted for evaluation of heart failure.  Admitted 02/2922 3 03/15/2022 with acute hypoxic respiratory failure due to multifocal pneumonia, requiring mechanical ventilation.  Found to have NSTEMI during admission.  Echocardiogram 03/04/2022 showed EF 40 to 45%.  LHC on 03/12/2022 showed multivessel CAD with 80 to 90% stenosis of mid LAD, moderate to severe disease of D1, RPDA, distal LCx, R PLA B.  Underwent DES to mid LAD.  Now presents with generalized weakness and poor p.o. intake and confusion since recent discharge.  Assessment & Plan    Acute combined systolic and diastolic heart failure: Echocardiogram 03/04/2022 with EF 40 to 45%.  Echo this admission shows EF 25 to 30%, moderate RV dysfunction, moderate MR. BNP greater than 4500 on admission.  Chest x-ray with bilateral opacities with effusions, edema versus infiltrate.  EKG without acute changes. -Volume management per nephrology, has not been responding to peritoneal dialysis and unlikely to tolerate intermittent HD.  Planning transfer to ICU for CRRT -GDMT limited by blood pressure and renal function.  Stopped metoprolol in setting of starting midodrine for low BP  CAD: Status post DES to LAD 03/14/2022.  Troponin elevated at 153 > 143, likely demand ischemia in setting of acute illness.   -Continue Lipitor, Zetia -On aspirin and Plavix given recent stent.  However in the setting of starting anticoagulation for acute DVT, stopped aspirin.  Continue Plavix plus Eliquis moving forward  Acute left lower extremity DVT: Started on Eliquis  Acute hypoxic respiratory failure: Likely multifactorial in setting of recurrent pneumonia and volume overload.  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  Head CT did show petechial hemorrhage but okay to continue Eliquis/Plavix given recent DES placement per neurology.  Goals of care: Agree with palliative consult  For questions or updates,  please contact [place] Please consult [email or web address] for contact info  under        Signed, Donato Heinz, MD  03/25/2022, 11:16 AM

## 2022-03-25 NOTE — Consult Note (Signed)
NAME:  William [name], MRN:  [id number], DOB:  [birth year], LOS: 2 ADMISSION DATE:  03/22/2022, CONSULTATION DATE:  03/25/22 REFERRING MD:  Candiss Norse - [facility] , CHIEF COMPLAINT:  need for CRRT   History of Present Illness:  82 yo M PMH ESRD on PD, CAD with recent NSTEMI (03/04/22) and recent DES to LAD (6/73/41), chronic systolic and diastolic HF who was admitted 1/19 for generalized weakness. Found to have decompensated HF, likely PNA. Started on abx, nephro and cards consulted. 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 On 1/20 a Lower extremity US revealed LLE DVT. DAPT was changed to plavix and eliquis. On 1/21 a decision was reached to attempt CRRT for a couple of days as a means of volume removal.   PCCM is consulted in this setting   Pertinent  Medical History  CAD NSTEMI ICM Chronic systolic and diastolic HF  CVA DVT ESRD   Significant Hospital Events: Including procedures, antibiotic start and stop dates in addition to other pertinent events   1/19 admit for wkness. Decomp HF. L PCA CVA. Neuro, cards, nephro consult  1/20 LLE DVT. Changed DAPT to eliquis plavix 1/21 decision for CRRT for volume removal. PCCM consulted   Interim History / Subjective:  Decision for CRRT   Objective   Blood pressure 122/74, pulse 70, temperature 97.9 F (36.6 C), temperature source Oral, resp. rate 19, height 6' (1.829 m), weight 82.1 kg, SpO2 100 %.        Intake/Output Summary (Last 24 hours) at 03/25/2022 1000 Last data filed at 03/25/2022 0630 Gross per 24 hour  Intake 0 ml  Output 404 ml  Net -404 ml   Filed Weights   03/22/22 1557 03/24/22 0900  Weight: 83.9 kg 82.1 kg    Examination: General: elderly M NAD  HENT: NCAT [place] in place  Lungs: even and unlabored, anteriorly bas some basilar rales   Cardiovascular: rr cap refill < 3 sec  Abdomen: soft  Extremities: no acute joint deformity. No pitting edema  Neuro: Awake but lethargic. 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 Hopefully in ICU will be able to help encourage supination  -was DAPT after DES, now with DVT rec is for plavix + eliquis  -Stroke team, cards, and nephro are following  -unasyn  -midodrine  -delirium precautions   Best Practice (right click and "Reselect all SmartList Selections" daily)   Diet/type: NPO DVT prophylaxis: DOAC GI prophylaxis: N/A Lines: Dialysis Catheter pending  Foley:  N/A Code Status:  DNR Last date of multidisciplinary goals of care discussion [daughter updated 1/21]  Labs   CBC: Recent Labs  Lab 03/22/22 1557 03/22/22 1605 03/23/22 0650 03/24/22 0554 03/25/22 0245  WBC 12.4*  --  10.6* 7.3 8.1  NEUTROABS 10.8*  --   --  5.9 6.7  HGB 9.8* 10.9* 9.0* 9.3* 9.6*  HCT 30.7* 32.0* 28.1* 29.2* 30.1*  MCV 105.1*  --  104.5* 105.4* 105.2*  PLT 199  --  189 223 149    Basic Metabolic Panel: Recent Labs  Lab 03/22/22 1557 03/22/22 1605 03/23/22 0650 03/24/22 0554 03/25/22 0245  NA 135 132* 136 138 138  K 4.7 4.7 4.9 4.4 4.0  CL 94*  --  95* 96* 96*  CO2 25  --  '22 22 22  '$ GLUCOSE 169*  --  109* 128* 164*  BUN 76*  --  84* 86* 87*  CREATININE 8.52*  --  9.44* 9.23* 9.24*  CALCIUM 7.3*  --  7.3* 7.7* 7.8*  MG  --   --  2.1 2.2 2.3   GFR: Estimated Creatinine Clearance: 7 mL/min (A) (by C-G formula based on SCr of 9.24 mg/dL (H)). Recent Labs  Lab 03/22/22 1557 03/22/22 1756 03/23/22 0650 03/24/22 0554 03/25/22 0245  PROCALCITON  --   --  0.60 0.70 0.60  WBC 12.4*  --  10.6* 7.3 8.1  LATICACIDVEN 2.1*  1.5  --   --   --     Liver Function Tests: Recent Labs  Lab 03/22/22 1557  AST 34  ALT 42  ALKPHOS 70  BILITOT 0.7  PROT 6.3*  ALBUMIN 2.6*   No results for input(s): "LIPASE", "AMYLASE" in the last 168 hours. Recent Labs  Lab 03/22/22 1657  AMMONIA 12    ABG    Component Value Date/Time   PHART 7.396 03/04/2022 1116   PCO2ART 29.2 (L) 03/04/2022 1116   PO2ART 79 (L) 03/04/2022 1116   HCO3 26.6 03/22/2022 1605   TCO2 28 03/22/2022 1605   ACIDBASEDEF 6.0 (H) 03/04/2022 1116   O2SAT 22 03/22/2022 1605     Coagulation Profile: Recent Labs  Lab 03/22/22 1557  INR 1.3*    Cardiac Enzymes: No results for input(s): "CKTOTAL", "CKMB", "CKMBINDEX", "TROPONINI" in the last 168 hours.  HbA1C: Hgb A1c MFr Bld  Date/Time Value Ref Range Status  03/14/2022 01:34 AM 5.5 4.8 - 5.6 % Final    Comment:    (NOTE) Pre diabetes:          5.7%-6.4%  Diabetes:              >6.4%  Glycemic control for   <7.0% adults with diabetes     CBG: Recent Labs  Lab 03/22/22 1556 03/23/22 1322  GLUCAP 150* 106*    Review of Systems:   Unable to obtain due to encephalopathy   Past Medical History:  He,  has a past medical history of Aortic insufficiency, Coronary artery disease, ESRD on peritoneal dialysis ([place]), Hyperlipidemia, and Hypertension.   Surgical History:   Past Surgical History:  Procedure Laterality Date   APPENDECTOMY     CARDIOVASCULAR STRESS TEST  06/08/2009   EF 43%   CHOLECYSTECTOMY     CORONARY STENT INTERVENTION N/A 03/14/2022   Procedure: CORONARY STENT INTERVENTION;  Surgeon: Troy Sine, MD;  Location: [facility];  Service: Cardiovascular;  Laterality: N/A;   FOOT SURGERY     HERNIA REPAIR     LEFT HEART CATH AND CORONARY ANGIOGRAPHY N/A 03/12/2022   Procedure: LEFT HEART CATH AND CORONARY ANGIOGRAPHY;  Surgeon: Nelva Bush, MD;  Location: [facility];  Service: Cardiovascular;  Laterality: N/A;   US ECHOCARDIOGRAPHY   05/24/2009   EF 60%     Social History:   reports that he quit smoking about 56 years ago. His smoking use included cigarettes. He has never used smokeless tobacco. He reports that he does not drink alcohol and does not use drugs.  Family History:  His family history includes Cancer in his mother; Cirrhosis in his father; Diabetes in his brother.   Allergies Allergies  Allergen Reactions   Nsaids Other (See Comments)    Chronic kidney disease   Valacyclovir Nausea And Vomiting   Lisinopril Rash     Home Medications  Prior to Admission medications   Medication Sig Start Date End Date Taking? Authorizing Provider  acetaminophen (TYLENOL) 325 MG tablet Take 2 tablets (650 mg total) by mouth every 6 (six) hours as needed for headache or mild pain. 03/15/22  Yes Mercy Riding, MD  aspirin 81 MG chewable tablet Chew 1 tablet (81 mg total) by mouth daily. 03/16/22 03/16/23 Yes Mercy Riding, MD  atorvastatin (LIPITOR) 20 MG tablet Take 1 tablet (20 mg total) by mouth daily. 03/16/22 06/14/22 Yes Mercy Riding, MD  clopidogrel (PLAVIX) 75 MG tablet Take 1 tablet (75 mg total) by mouth daily. 03/16/22 03/16/23 Yes Mercy Riding, MD  ezetimibe (ZETIA) 10 MG tablet Take 1 tablet (10 mg total) by mouth daily. 03/16/22 06/14/22 Yes Mercy Riding, MD  melatonin 3 MG TABS tablet Take 1 tablet (3 mg total) by mouth at bedtime. 03/15/22  Yes Mercy Riding, MD  metoprolol tartrate (LOPRESSOR) 25 MG tablet Take 1 tablet (25 mg total) by mouth 2 (two) times daily. 03/15/22 09/11/22 Yes Mercy Riding, MD  ondansetron (ZOFRAN) 4 MG tablet Take 1 tablet (4 mg total) by mouth daily as needed for up to 365 doses for nausea or vomiting. 03/15/22  Yes Mercy Riding, MD  pantoprazole (PROTONIX) 40 MG tablet Take 1 tablet (40 mg total) by mouth daily. 03/16/22 04/15/22 Yes Mercy Riding, MD  sevelamer carbonate (RENVELA) 800 MG tablet Take 1 tablet (800 mg total) by mouth 3 (three) times daily with meals. 03/15/22 06/13/22 Yes  Mercy Riding, MD     Critical care time: n/a      Eliseo Gum MSN, AGACNP-BC [place] for pager  03/25/2022, 10:24 AM

## 2022-03-25 NOTE — Progress Notes (Signed)
[facility] Progress Note   Assessment/ Plan:   1 Acute hypoxic RF: recurrent PNA + some component of volume overload.  Being treated with vanc/ cefepime--> Unasyn  modified PD script seemed to be somewhat effective first night it was done but now pt is more encephalopathic with lower EF- needs HD at least for short term- likely would not tolerate IHD with EF down, will do CRRT.  All 4K.  No heparin as is on Eliquis.  Greatly appreciate PCCM colleagues. 2 ESRD: on PD- as above in #1  CRRT for 48ish hours 3 Hypotension: midodrine 5 TID 4. 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 Metabolic Bone Disease:  renal vitamin with fluid restriction 6.  NSTEMI: s/p LHC 1/10, on ASA/ Plavix 7.  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  Getting MRI/MRA (acute infarct in L PCA territory) carotid US 8.  Dispo: to ICU  Subjective:    Seen in room.  TTE with EF down to 25-30%.  Not mentating very well- confused this AM, was stumbling around trying to get from the chair to the bed for IV team.  BUN hasn't budged despite adding an extra exchange and increasing UF.  Only net neg 400 mL last night.  D/w primary, pt and wife.  Needs hemodialysis- I don't think he can tolerate IHD with the EF being down after stent- will move to ICU for CRRT.  Anticipate at least 48 hrs.      Objective:   BP 122/74   Pulse 70   Temp 97.9 F (36.6 C) (Oral)   Resp 19   Ht 6' (1.829 m)   Wt 82.1 kg   SpO2 100%   BMI 24.55 kg/m   Physical Exam: GEN  older gentleman, encephalopathic today and falling asleep in the middle of our conversation HEENT EOMI PERRL NECK JVP the same PULM coarse throughout CV RRR, no rubs heard ABD: soft, nontender, + flud wave EXT 1-2+ LE edema ACCESS PD cath intact. C/d/I  Labs: BMET Recent Labs  Lab 03/22/22 1557 03/22/22 1605 03/23/22 0650 03/24/22 0554 03/25/22 0245  NA 135 132* 136 138 138  K 4.7 4.7 4.9 4.4 4.0  CL 94*  --  95* 96* 96*  CO2 25  --   '22 22 22  '$ GLUCOSE 169*  --  109* 128* 164*  BUN 76*  --  84* 86* 87*  CREATININE 8.52*  --  9.44* 9.23* 9.24*  CALCIUM 7.3*  --  7.3* 7.7* 7.8*   CBC Recent Labs  Lab 03/22/22 1557 03/22/22 1605 03/23/22 0650 03/24/22 0554 03/25/22 0245  WBC 12.4*  --  10.6* 7.3 8.1  NEUTROABS 10.8*  --   --  5.9 6.7  HGB 9.8* 10.9* 9.0* 9.3* 9.6*  HCT 30.7* 32.0* 28.1* 29.2* 30.1*  MCV 105.1*  --  104.5* 105.4* 105.2*  PLT 199  --  189 223 258      Medications:     apixaban  5 mg Oral BID   atorvastatin  20 mg Oral Daily   clopidogrel  75 mg Oral Daily   docusate sodium  100 mg Oral Daily   ezetimibe  10 mg Oral Daily   feeding supplement  237 mL Oral BID BM   midodrine  5 mg Oral TID WC   multivitamin  1 tablet Oral QHS   pantoprazole  40 mg Oral Daily   polyethylene glycol  17 g Oral Daily   sevelamer carbonate  800 mg Oral TID WC     Madelon Lips MD 03/25/2022, 9:32 AM

## 2022-03-25 NOTE — Progress Notes (Addendum)
Rounding Note    Patient Name: William [name] Date of Encounter: 03/25/2022  [place] Cardiologist: Janina Mayo, MD   Subjective   Laying in bed on CRRT. Denies chest pain.  Inpatient Medications    Scheduled Meds:  apixaban  5 mg Oral BID   atorvastatin  20 mg Oral Daily   Chlorhexidine Gluconate Cloth  6 each Topical Daily   clopidogrel  75 mg Oral Daily   docusate sodium  100 mg Oral Daily   ezetimibe  10 mg Oral Daily   feeding supplement  237 mL Oral BID BM   midodrine  5 mg Oral TID WC   multivitamin  1 tablet Oral QHS   pantoprazole  40 mg Oral Daily   polyethylene glycol  17 g Oral Daily   sevelamer carbonate  800 mg Oral TID WC   Continuous Infusions:   prismasol BGK 4/2.5 500 mL/hr at 03/25/22 1615    prismasol BGK 4/2.5 400 mL/hr at 03/25/22 1615   sodium chloride Stopped (03/25/22 1713)   ampicillin-sulbactam (UNASYN) IV Stopped (03/25/22 1430)   prismasol BGK 4/2.5 2,000 mL/hr at 03/25/22 1856   PRN Meds: sodium chloride, acetaminophen **OR** acetaminophen, heparin, LORazepam, ondansetron (ZOFRAN) IV, ondansetron   Vital Signs    Vitals:   03/25/22 1615 03/25/22 1630 03/25/22 1700 03/25/22 1800  BP: 114/66 116/66 117/67 123/71  Pulse: 68 72 68 64  Resp: 20 (!) 22 (!) 22 (!) 21  Temp: 98.4 F (36.9 C)     TempSrc: Oral     SpO2: 100% 92% 98% 100%  Weight:      Height:        Intake/Output Summary (Last 24 hours) at 03/25/2022 2040 Last data filed at 03/25/2022 1900 Gross per 24 hour  Intake 247.29 ml  Output 647 ml  Net -399.71 ml       03/25/2022    3:00 PM 03/24/2022    9:00 AM 03/22/2022    3:57 PM  Last 3 Weights  Weight (lbs) 177 lb 0.5 oz 181 lb 185 lb  Weight (kg) 80.3 kg 82.1 kg 83.915 kg      Telemetry    NSR, occasional PVCs - Personally Reviewed  ECG   No new tracing - Personally Reviewed  Physical Exam   GEN: Comfortable, elderly  Neck: +JVD Cardiac: RRR, no murmurs Respiratory: Bibasilar  crackles GI: Soft, nontender MS: No edema. Warm Neuro:  Nonfocal  Psych: Normal affect   Labs    High Sensitivity Troponin:   Recent Labs  Lab 03/08/22 0917 03/08/22 1520 03/08/22 1850 03/22/22 1759 03/22/22 2341  TROPONINIHS 2,560* 1,534* 1,415* 153* 143*      Chemistry Recent Labs  Lab 03/22/22 1557 03/22/22 1605 03/23/22 0650 03/24/22 0554 03/25/22 0245  NA 135   < > 136 138 138  K 4.7   < > 4.9 4.4 4.0  CL 94*  --  95* 96* 96*  CO2 25  --  '22 22 22  '$ GLUCOSE 169*  --  109* 128* 164*  BUN 76*  --  84* 86* 87*  CREATININE 8.52*  --  9.44* 9.23* 9.24*  CALCIUM 7.3*  --  7.3* 7.7* 7.8*  MG  --   --  2.1 2.2 2.3  PROT 6.3*  --   --   --   --   ALBUMIN 2.6*  --   --   --   --   AST 34  --   --   --   --  ALT 42  --   --   --   --   ALKPHOS 70  --   --   --   --   BILITOT 0.7  --   --   --   --   GFRNONAA 6*  --  5* 5* 5*  ANIONGAP 16*  --  19* 20* 20*   < > = values in this interval not displayed.     Lipids  Recent Labs  Lab 03/23/22 0649  CHOL 86  TRIG 85  HDL 37*  LDLCALC 32  CHOLHDL 2.3     Hematology Recent Labs  Lab 03/23/22 0650 03/24/22 0554 03/25/22 0245  WBC 10.6* 7.3 8.1  RBC 2.69* 2.77* 2.86*  HGB 9.0* 9.3* 9.6*  HCT 28.1* 29.2* 30.1*  MCV 104.5* 105.4* 105.2*  MCH 33.5 33.6 33.6  MCHC 32.0 31.8 31.9  RDW 14.8 14.7 14.7  PLT 189 223 258    Thyroid  Recent Labs  Lab 03/23/22 0650  TSH 1.029     BNP Recent Labs  Lab 03/23/22 0650 03/24/22 0554 03/25/22 0245  BNP >4,500.0* >4,500.0* >4,500.0*     DDimer No results for input(s): "DDIMER" in the last 168 hours.   Radiology    DG CHEST PORT 1 VIEW  Result Date: 03/25/2022 CLINICAL DATA:  Central line placement EXAM: PORTABLE CHEST 1 VIEW COMPARISON:  Chest radiograph 03/23/2022 FINDINGS: Monitoring leads overlie the patient. New right IJ central venous catheter tip projects over the expected location of the superior vena cava. Stable cardiomegaly. Similar bilateral  airspace opacities. Probable small left pleural effusion. No pneumothorax. Thoracic spine degenerative changes. IMPRESSION: New right IJ central venous catheter tip projects over the expected location of the superior vena cava. Electronically Signed   By: Lovey Newcomer M.D.   On: 03/25/2022 15:04   VAS [place] LOWER EXTREMITY VENOUS (DVT)  Result Date: 03/25/2022  Lower Venous DVT Study Patient Name:  William [name]  Date of Exam:   03/24/2022 Medical Rec #: [id number]       Accession #:    [id number] Date of Birth: 09-04-1940      Patient Gender: M Patient Age:   82 years Exam Location:  [facility] Procedure:      VAS [place] LOWER EXTREMITY VENOUS (DVT) Referring Phys: Cornelius Moras XU --------------------------------------------------------------------------------  Indications: Stroke.  Risk Factors: None identified. Limitations: Poor ultrasound/tissue interface and patient positioning, patient movement. Comparison Study: No prior studies. Performing Technologist: Oliver Hum RVT  Examination Guidelines: A complete evaluation includes B-mode imaging, spectral Doppler, color Doppler, and power Doppler as needed of all accessible portions of each vessel. Bilateral testing is considered an integral part of a complete examination. Limited examinations for reoccurring indications may be performed as noted. The reflux portion of the exam is performed with the patient in reverse Trendelenburg.  +---------+---------------+---------+-----------+----------+--------------+ RIGHT    CompressibilityPhasicitySpontaneityPropertiesThrombus Aging +---------+---------------+---------+-----------+----------+--------------+ CFV      Full           Yes      Yes                                 +---------+---------------+---------+-----------+----------+--------------+ SFJ      Full                                                        +---------+---------------+---------+-----------+----------+--------------+  FV  Prox  Full                                                        +---------+---------------+---------+-----------+----------+--------------+ FV Mid   Full                                                        +---------+---------------+---------+-----------+----------+--------------+ FV DistalFull                                                        +---------+---------------+---------+-----------+----------+--------------+ PFV      Full                                                        +---------+---------------+---------+-----------+----------+--------------+ POP      Full           Yes      Yes                                 +---------+---------------+---------+-----------+----------+--------------+ PTV      Full                                                        +---------+---------------+---------+-----------+----------+--------------+ PERO     Full                                                        +---------+---------------+---------+-----------+----------+--------------+ SSV      None                                         Acute          +---------+---------------+---------+-----------+----------+--------------+   +---------+---------------+---------+-----------+----------+--------------+ LEFT     CompressibilityPhasicitySpontaneityPropertiesThrombus Aging +---------+---------------+---------+-----------+----------+--------------+ CFV      Full           Yes      Yes                                 +---------+---------------+---------+-----------+----------+--------------+ SFJ      Full                                                        +---------+---------------+---------+-----------+----------+--------------+  FV Prox  Full                                                        +---------+---------------+---------+-----------+----------+--------------+ FV Mid   Full                                                         +---------+---------------+---------+-----------+----------+--------------+ FV DistalFull                                                        +---------+---------------+---------+-----------+----------+--------------+ PFV      Full                                                        +---------+---------------+---------+-----------+----------+--------------+ POP      None           No       No                   Acute          +---------+---------------+---------+-----------+----------+--------------+ PTV      Full                                                        +---------+---------------+---------+-----------+----------+--------------+ PERO     None                                         Acute          +---------+---------------+---------+-----------+----------+--------------+     Summary: RIGHT: - Findings consistent with acute superficial vein thrombosis involving the right small saphenous vein. - There is no evidence of deep vein thrombosis in the lower extremity.  - No cystic structure found in the popliteal fossa.  LEFT: - Findings consistent with acute deep vein thrombosis involving the left popliteal vein, and left peroneal veins. - No cystic structure found in the popliteal fossa.  *See table(s) above for measurements and observations. Electronically signed by Harold Barban MD on 03/25/2022 at 2:17:21 PM.    Final    DG Abd Portable 1V  Result Date: 03/25/2022 CLINICAL DATA:  Nausea. EXAM: PORTABLE ABDOMEN - 1 VIEW COMPARISON:  03/08/2022 FINDINGS: No gaseous bowel dilatation. Peritoneal dialysis catheter evident. Density over the central pelvis is compatible with a distended bladder. IMPRESSION: No evidence for bowel obstruction. No findings to explain the patient's history of pain. Electronically Signed   By: Misty Stanley M.D.   On: 03/25/2022 11:54   ECHOCARDIOGRAM LIMITED BUBBLE STUDY  Result Date: 03/24/2022     ECHOCARDIOGRAM LIMITED  REPORT   Patient Name:   RIGOBERTO REPASS Date of Exam: 03/24/2022 Medical Rec #:  [id number]      Height:       72.0 in Accession #:    [id number]     Weight:       181.0 lb Date of Birth:  29-May-1940     BSA:          2.042 m Patient Age:    26 years       BP:           114/76 mmHg Patient Gender: M              HR:           75 bpm. Exam Location:  Inpatient Procedure: Limited Echo and Limited Color Doppler Indications:    Stroke I63.9  History:        Patient has prior history of Echocardiogram examinations, most                 recent 03/04/2022. CHF, Previous Myocardial Infarction and CAD;                 Risk Factors:Hypertension and Dyslipidemia. ESRD.  Sonographer:    Ronny Flurry Referring Phys: Asbury K [place]  1. Left ventricular ejection fraction, by estimation, is 25 to 30%. The left ventricle has severely decreased function. The left ventricle demonstrates global hypokinesis. The left ventricular internal cavity size was severely dilated. There is mild left ventricular hypertrophy. Left ventricular diastolic parameters are indeterminate.  2. Right ventricular systolic function is moderately reduced. The right ventricular size is mildly enlarged. Tricuspid regurgitation signal is inadequate for assessing PA pressure.  3. The mitral valve is abnormal. Moderate mitral valve regurgitation. Appears functional  4. The aortic valve was not well visualized. Aortic valve regurgitation is mild.  5. Aortic dilatation noted. Aneurysm of the aortic root, measuring 48 mm.  6. The inferior vena cava is dilated in size with <50% respiratory variability, suggesting right atrial pressure of 15 mmHg.  7. Agitated saline contrast bubble study was positive with shunting observed after >6 cardiac cycles suggestive of intrapulmonary shunting. FINDINGS  Left Ventricle: Left ventricular ejection fraction, by estimation, is 25 to 30%. The left ventricle has severely decreased function.  The left ventricle demonstrates global hypokinesis. The left ventricular internal cavity size was severely dilated. There is mild left ventricular hypertrophy. Left ventricular diastolic parameters are indeterminate. Right Ventricle: The right ventricular size is mildly enlarged. Right ventricular systolic function is moderately reduced. Tricuspid regurgitation signal is inadequate for assessing PA pressure. Mitral Valve: The mitral valve is abnormal. Moderate mitral valve regurgitation. Aortic Valve: The aortic valve was not well visualized. Aortic valve regurgitation is mild. Aorta: Aortic dilatation noted. There is an aneurysm involving the aortic root measuring 48 mm. Venous: The inferior vena cava is dilated in size with less than 50% respiratory variability, suggesting right atrial pressure of 15 mmHg. IAS/Shunts: Agitated saline contrast bubble study was positive with shunting observed after >6 cardiac cycles suggestive of intrapulmonary shunting. LEFT VENTRICLE PLAX 2D LVIDd:         6.90 cm   Diastology LVIDs:         6.50 cm   LV e' medial:  3.45 cm/s LV PW:         1.10 cm   LV e' lateral: 5.40 cm/s LV IVS:        0.90 cm LVOT diam:  2.10 cm LVOT Area:     3.46 cm  RIGHT VENTRICLE             IVC RV S prime:     11.90 cm/s  IVC diam: 2.30 cm LEFT ATRIUM         Index LA diam:    5.10 cm 2.50 cm/m   AORTA Ao Root diam: 4.80 cm Ao Asc diam:  3.60 cm  SHUNTS Systemic Diam: 2.10 cm Oswaldo Milian MD Electronically signed by Oswaldo Milian MD Signature Date/Time: 03/24/2022/5:26:35 PM    Final    VAS [place] TRANSCRANIAL DOPPLER W BUBBLES  Result Date: 03/24/2022  Transcranial Doppler with Bubble Patient Name:  JOVI ZAVADIL  Date of Exam:   03/24/2022 Medical Rec #: [id number]       Accession #:    [id number] Date of Birth: 11-23-1940      Patient Gender: M Patient Age:   31 years Exam Location:  [facility] Procedure:      VAS [place] TRANSCRANIAL [place] Referring Phys: Cornelius Moras XU  --------------------------------------------------------------------------------  Indications: Stroke. History: Positive for lower extremity DVT. Performing Technologist: Darlin Coco RDMS, RVT  Examination Guidelines: A complete evaluation includes B-mode imaging, spectral Doppler, color Doppler, and power Doppler as needed of all accessible portions of each vessel. Bilateral testing is considered an integral part of a complete examination. Limited examinations for reoccurring indications may be performed as noted.  Summary: No HITS at rest or during Valsalva. Negative transcranial Doppler Bubble study with no evidence of right to left intracardiac communication.  A vascular evaluation was performed. The right middle cerebral artery was studied. An IV was inserted into the patient's left antecubital. Verbal informed consent was obtained.  *See table(s) above for TCD measurements and observations.  Diagnosing physician: Rosalin Hawking MD Electronically signed by Rosalin Hawking MD on 03/24/2022 at 5:16:55 PM.    Final    VAS US CAROTID  Result Date: 03/24/2022 Carotid Arterial Duplex Study Patient Name:  TAYVEN RENTERIA  Date of Exam:   03/24/2022 Medical Rec #: [id number]       Accession #:    [id number] Date of Birth: 10/20/1940      Patient Gender: M Patient Age:   21 years Exam Location:  [facility] Procedure:      VAS US CAROTID Referring Phys: Deno Etienne [facility] --------------------------------------------------------------------------------  Indications:       CVA. Risk Factors:      Hypertension, hyperlipidemia, past history of smoking,                    coronary artery disease. Other Factors:     ESRD. Comparison Study:  No prior studies. Performing Technologist: Darlin Coco RDMS, RVT  Examination Guidelines: A complete evaluation includes B-mode imaging, spectral Doppler, color Doppler, and power Doppler as needed of all accessible portions of each vessel. Bilateral testing is considered an integral part of  a complete examination. Limited examinations for reoccurring indications may be performed as noted.  Right Carotid Findings: +----------+--------+--------+--------+------------------+------------------+           PSV cm/sEDV cm/sStenosisPlaque DescriptionComments           +----------+--------+--------+--------+------------------+------------------+ CCA Prox  65      10                                                   +----------+--------+--------+--------+------------------+------------------+  CCA Distal39      6                                 intimal thickening +----------+--------+--------+--------+------------------+------------------+ ICA Prox  56      14                                                   +----------+--------+--------+--------+------------------+------------------+ ICA Mid   83      27                                                   +----------+--------+--------+--------+------------------+------------------+ ICA Distal75      20                                                   +----------+--------+--------+--------+------------------+------------------+ ECA       38                                                           +----------+--------+--------+--------+------------------+------------------+ +----------+--------+-------+----------------+-------------------+           PSV cm/sEDV cmsDescribe        Arm Pressure (mmHG) +----------+--------+-------+----------------+-------------------+ [id number]             Multiphasic, WNL                    +----------+--------+-------+----------------+-------------------+ +---------+--------+--+--------+--+---------+ VertebralPSV cm/s48EDV cm/s10Antegrade +---------+--------+--+--------+--+---------+  Left Carotid Findings: +----------+--------+--------+--------+------------------+------------------+           PSV cm/sEDV cm/sStenosisPlaque DescriptionComments            +----------+--------+--------+--------+------------------+------------------+ CCA Prox  87      11                                                   +----------+--------+--------+--------+------------------+------------------+ CCA Distal53      9                                 intimal thickening +----------+--------+--------+--------+------------------+------------------+ ICA Prox  53      16                                tortuous           +----------+--------+--------+--------+------------------+------------------+ ICA Distal106     23                                                   +----------+--------+--------+--------+------------------+------------------+  ECA       55                                                           +----------+--------+--------+--------+------------------+------------------+ +----------+--------+--------+----------------+-------------------+           PSV cm/sEDV cm/sDescribe        Arm Pressure (mmHG) +----------+--------+--------+----------------+-------------------+ [id number]              Multiphasic, WNL                    +----------+--------+--------+----------------+-------------------+ +---------+--------+--+--------+--+---------+ VertebralPSV cm/s40EDV cm/s14Antegrade +---------+--------+--+--------+--+---------+   Summary: Right Carotid: The extracranial vessels were near-normal with only minimal wall                thickening or plaque. Left Carotid: The extracranial vessels were near-normal with only minimal wall               thickening or plaque. Vertebrals:  Bilateral vertebral arteries demonstrate antegrade flow. Subclavians: Normal flow hemodynamics were seen in bilateral subclavian              arteries. *See table(s) above for measurements and observations.     Preliminary     Cardiac Studies   TTE 03/24/22: IMPRESSIONS     1. Left ventricular ejection fraction, by estimation, is 25 to 30%. The   left ventricle has severely decreased function. The left ventricle  demonstrates global hypokinesis. The left ventricular internal cavity size  was severely dilated. There is mild  left ventricular hypertrophy. Left ventricular diastolic parameters are  indeterminate.   2. Right ventricular systolic function is moderately reduced. The right  ventricular size is mildly enlarged. Tricuspid regurgitation signal is  inadequate for assessing PA pressure.   3. The mitral valve is abnormal. Moderate mitral valve regurgitation.  Appears functional   4. The aortic valve was not well visualized. Aortic valve regurgitation  is mild.   5. Aortic dilatation noted. Aneurysm of the aortic root, measuring 48 mm.   6. The inferior vena cava is dilated in size with <50% respiratory  variability, suggesting right atrial pressure of 15 mmHg.   7. Agitated saline contrast bubble study was positive with shunting  observed after >6 cardiac cycles suggestive of intrapulmonary shunting.    Patient Profile     82 y.o. male with history of ESRD on PD, CAD status post NSTEMI 03/04/2022 with DES to LAD, chronic combined systolic and diastolic heart failure with recent complex admission with acute hypoxic respiratory failure secondary to PNA requiring intubation with course complicated by NSTEMI and new HFrEF with EF 40-45%.   LHC on 03/12/2022 showed multivessel CAD with 80 to 90% stenosis of mid LAD, moderate to severe disease of D1, RPDA, distal LCx, R PLA B.  Underwent DES to mid LAD.  He now represents with generalized weakness and poor PO intake since recent discharge. Cardiology is consulted for acute on chronic HFrEF exacerbation.  Assessment & Plan   #Acute on chronic combined systolic and diastolic heart failure:  Patient presented with worsening weakness and poor PO intake found to have BNP >4500 with pleural effusions and pulmonary edema on CXR. TTE this admission showed EF 25 to 30%, moderate RV  dysfunction, moderate MR  from EF 40-45% on 02/2022. Was started on CRRT for volume  management as was failing PD. Unable to tolerate GDMT due to soft blood pressures.  -Continue CRRT for volume management -Unable to tolerate GDMT due to hypotension  #Multivessel CAD:  Status post DES to LAD 03/14/2022.  Troponin on this admission elevated at 153 > 143, likely demand ischemia in setting of acute illness.   -Continue Lipitor, Zetia -On plavix and apixaban due to acute DVT (aspirin stopped)  #Acute left lower extremity DVT:  -Started on Eliquis '5mg'$  BID  #Acute hypoxic respiratory failure:  Likely multifactorial in setting of recurrent pneumonia and volume overload.   -Management per ICU  #Subacute CVA:  Noted on head CT on admission.  Head CT did show petechial hemorrhage but okay to continue Eliquis/Plavix given recent DES placement per neurology.  #ESRD: On PD at home but has been started on CRRT for volume management during admission given significant volume overload and soft BP with drop in EF. -Management per Nephrology  #Goals of care:  -Palliative consulted   For questions or updates, please contact [place] Please consult [email or web address] for contact info under        Signed, Freada Bergeron, MD  03/25/2022, 8:40 PM

## 2022-03-26 ENCOUNTER — Other Ambulatory Visit (HOSPITAL_COMMUNITY): Payer: Self-pay

## 2022-03-26 ENCOUNTER — Ambulatory Visit: Payer: PPO | Admitting: General Practice

## 2022-03-26 DIAGNOSIS — N186 End stage renal disease: Secondary | ICD-10-CM | POA: Diagnosis not present

## 2022-03-26 DIAGNOSIS — I82462 Acute embolism and thrombosis of left calf muscular vein: Secondary | ICD-10-CM | POA: Diagnosis not present

## 2022-03-26 DIAGNOSIS — J189 Pneumonia, unspecified organism: Secondary | ICD-10-CM | POA: Diagnosis not present

## 2022-03-26 DIAGNOSIS — I5043 Acute on chronic combined systolic (congestive) and diastolic (congestive) heart failure: Secondary | ICD-10-CM | POA: Diagnosis not present

## 2022-03-26 DIAGNOSIS — J9601 Acute respiratory failure with hypoxia: Secondary | ICD-10-CM | POA: Diagnosis not present

## 2022-03-26 DIAGNOSIS — G9341 Metabolic encephalopathy: Secondary | ICD-10-CM | POA: Diagnosis not present

## 2022-03-26 DIAGNOSIS — I2510 Atherosclerotic heart disease of native coronary artery without angina pectoris: Secondary | ICD-10-CM | POA: Diagnosis not present

## 2022-03-26 DIAGNOSIS — Z7189 Other specified counseling: Secondary | ICD-10-CM | POA: Diagnosis not present

## 2022-03-26 DIAGNOSIS — Z515 Encounter for palliative care: Secondary | ICD-10-CM | POA: Diagnosis not present

## 2022-03-26 DIAGNOSIS — I63432 Cerebral infarction due to embolism of left posterior cerebral artery: Secondary | ICD-10-CM | POA: Diagnosis not present

## 2022-03-26 LAB — RENAL FUNCTION PANEL
Albumin: 2.2 g/dL — ABNORMAL LOW (ref 3.5–5.0)
Albumin: 2.6 g/dL — ABNORMAL LOW (ref 3.5–5.0)
Anion gap: 11 (ref 5–15)
Anion gap: 13 (ref 5–15)
BUN: 33 mg/dL — ABNORMAL HIGH (ref 8–23)
BUN: 22 mg/dL (ref 8–23)
CO2: 26 mmol/L (ref 22–32)
CO2: 25 mmol/L (ref 22–32)
Calcium: 7.5 mg/dL — ABNORMAL LOW (ref 8.9–10.3)
Calcium: 8.1 mg/dL — ABNORMAL LOW (ref 8.9–10.3)
Chloride: 102 mmol/L (ref 98–111)
Chloride: 98 mmol/L (ref 98–111)
Creatinine, Ser: 3.58 mg/dL — ABNORMAL HIGH (ref 0.61–1.24)
Creatinine, Ser: 2.66 mg/dL — ABNORMAL HIGH (ref 0.61–1.24)
GFR, Estimated: 16 mL/min — ABNORMAL LOW (ref >60)
GFR, Estimated: 24 mL/min — ABNORMAL LOW (ref >60)
Glucose, Bld: 97 mg/dL (ref 70–99)
Glucose, Bld: 95 mg/dL (ref 70–99)
Phosphorus: 3.8 mg/dL (ref 2.5–4.6)
Phosphorus: 2.7 mg/dL (ref 2.5–4.6)
Potassium: 4.1 mmol/L (ref 3.5–5.1)
Potassium: 4.8 mmol/L (ref 3.5–5.1)
Sodium: 139 mmol/L (ref 135–145)
Sodium: 136 mmol/L (ref 135–145)

## 2022-03-26 LAB — CBC WITH DIFFERENTIAL/PLATELET
Abs Immature Granulocytes: 0.04 10*3/uL (ref 0.00–0.07)
Basophils Absolute: 0.1 10*3/uL (ref 0.0–0.1)
Basophils Relative: 1 %
Eosinophils Absolute: 0.2 10*3/uL (ref 0.0–0.5)
Eosinophils Relative: 4 %
HCT: 28.1 % — ABNORMAL LOW (ref 39.0–52.0)
Hemoglobin: 8.8 g/dL — ABNORMAL LOW (ref 13.0–17.0)
Immature Granulocytes: 1 %
Lymphocytes Relative: 7 %
Lymphs Abs: 0.4 10*3/uL — ABNORMAL LOW (ref 0.7–4.0)
MCH: 33.6 pg (ref 26.0–34.0)
MCHC: 31.3 g/dL (ref 30.0–36.0)
MCV: 107.3 fL — ABNORMAL HIGH (ref 80.0–100.0)
Monocytes Absolute: 0.6 10*3/uL (ref 0.1–1.0)
Monocytes Relative: 9 %
Neutro Abs: 4.9 10*3/uL (ref 1.7–7.7)
Neutrophils Relative %: 78 %
Platelets: 247 10*3/uL (ref 150–400)
RBC: 2.62 MIL/uL — ABNORMAL LOW (ref 4.22–5.81)
RDW: 14.6 % (ref 11.5–15.5)
WBC: 6.2 10*3/uL (ref 4.0–10.5)
nRBC: 0 % (ref 0.0–0.2)

## 2022-03-26 LAB — C-REACTIVE PROTEIN: CRP: 9.4 mg/dL — ABNORMAL HIGH (ref <1.0)

## 2022-03-26 LAB — PROCALCITONIN: Procalcitonin: 0.13 ng/mL

## 2022-03-26 LAB — BRAIN NATRIURETIC PEPTIDE: B Natriuretic Peptide: 2863.8 pg/mL — ABNORMAL HIGH (ref 0.0–100.0)

## 2022-03-26 LAB — MAGNESIUM: Magnesium: 2.3 mg/dL (ref 1.7–2.4)

## 2022-03-26 MED ORDER — "THROMBI-PAD 3""X3"" EX PADS"
1.0000 | MEDICATED_PAD | Freq: Four times a day (QID) | CUTANEOUS | Status: DC | PRN
  Filled 2022-03-26: qty 1

## 2022-03-26 MED ORDER — HEPARIN (PORCINE) 25000 UT/250ML-% IV SOLN
1150.0000 [IU]/h | INTRAVENOUS | Status: DC
  Administered 2022-03-26: 500 [IU]/h via INTRAVENOUS
  Administered 2022-03-28: 1000 [IU]/h via INTRAVENOUS
  Administered 2022-03-29: 1150 [IU]/h via INTRAVENOUS
  Filled 2022-03-26 (×3): qty 250

## 2022-03-26 MED ORDER — SODIUM CHLORIDE 0.9 % IV SOLN: 3.0000 g | Freq: Three times a day (TID) | INTRAVENOUS | Status: DC

## 2022-03-26 MED ORDER — ORAL CARE MOUTH RINSE: 15.0000 mL | OROMUCOSAL | Status: DC | PRN

## 2022-03-26 MED ORDER — "THROMBI-PAD 3""X3"" EX PADS"
1.0000 | MEDICATED_PAD | Freq: Once | CUTANEOUS | Status: AC
  Administered 2022-03-26: 1 via TOPICAL
  Filled 2022-03-26: qty 2

## 2022-03-26 MED ORDER — SODIUM CHLORIDE 0.9 % IV SOLN
3.0000 g | Freq: Three times a day (TID) | INTRAVENOUS | Status: AC
  Administered 2022-03-26 – 2022-03-28 (×7): 3 g via INTRAVENOUS
  Filled 2022-03-26 (×7): qty 8

## 2022-03-26 NOTE — TOC Benefit Eligibility Note (Signed)
Patient Teacher, English as a foreign language completed.    The patient is currently admitted and upon discharge could be taking Eliquis 5 mg.  The current 30 day co-pay is $47.00.   The patient is insured through [place] [place] Patient Advocate Specialist [place] Patient Advocate Team Direct Number: [phone]  Fax: [phone]

## 2022-03-26 NOTE — Progress Notes (Signed)
[facility] Progress Note   Assessment/ Plan:   1 Acute hypoxic RF: recurrent PNA + some component of volume overload.  Being treated with vanc/ cefepime--> Unasyn  modified PD script seemed to be somewhat effective first night it was done but now pt is more encephalopathic with lower EF- needs HD at least for short term- likely would not tolerate IHD with EF down, therefore CRRT started 1/21 with plan to do CRRT for about 48 hrs and then convert to HD.  All 4K bags.  No heparin as is on Eliquis.  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 Metabolic Bone Disease:  renal vitamin with fluid restriction 6.  NSTEMI: s/p LHC 1/10, on ASA/ Plavix 7.  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 Per primary service  Subjective:    Patient seen and examined on CRRT. Tolerating CRRT. Net neg ~1.3L. Family at bedside. Mental status seems to have improved.   Objective:   BP 118/63 (BP Location: Right Arm)   Pulse 60   Temp (!) 96.8 F (36 C) (Axillary)   Resp 17   Ht 6' (1.829 m)   Wt 80.3 kg   SpO2 98%   BMI 24.01 kg/m   Physical Exam: GEN  NAD, awake HEENT EOMI PERRL NECK JVP the same PULM coarse throughout CV RRR, no rubs heard ABD: soft, nontender, + flud wave EXT 1-2+ LE edema Neuro: awake, alert ACCESS PD cath C/d/I, RIJ temp HD cath  Labs: BMET Recent Labs  Lab 03/22/22 1557 03/22/22 1605 03/23/22 0650 03/24/22 0554 03/25/22 0245 03/26/22 0453  NA 135 132* 136 138 138 139  K 4.7 4.7 4.9 4.4 4.0 4.1  CL 94*  --  95* 96* 96* 102  CO2 25  --  '22 22 22 26  '$ GLUCOSE 169*  --  109* 128* 164* 97  BUN 76*  --  84* 86* 87* 33*  CREATININE 8.52*  --  9.44* 9.23* 9.24* 3.58*  CALCIUM 7.3*  --  7.3* 7.7* 7.8* 7.5*  PHOS  --   --   --   --   --  3.8    CBC Recent Labs  Lab 03/22/22 1557 03/22/22 1605 03/23/22 0650 03/24/22 0554 03/25/22 0245 03/26/22 0453  WBC 12.4*  --  10.6* 7.3 8.1 6.2  NEUTROABS 10.8*  --   --  5.9 6.7 4.9  HGB 9.8*   < > 9.0* 9.3* 9.6* 8.8*  HCT 30.7*   < > 28.1* 29.2* 30.1* 28.1*  MCV 105.1*  --  104.5* 105.4* 105.2* 107.3*  PLT 199  --  189 223 258 247   < > = values in this interval not displayed.      Medications:     apixaban  5 mg Oral BID   atorvastatin  20 mg Oral Daily   Chlorhexidine Gluconate Cloth  6 each Topical Daily   clopidogrel  75 mg Oral Daily   docusate sodium  100 mg Oral Daily   ezetimibe  10 mg Oral Daily   feeding supplement  237 mL Oral BID BM   multivitamin  1 tablet Oral QHS   pantoprazole  40 mg Oral Daily   polyethylene glycol  17 g Oral Daily   sevelamer  carbonate  800 mg Oral TID WC   Thrombi-Pad  1 each Topical Once    Gean Quint, MD [facility] 03/26/2022, 8:43 AM

## 2022-03-26 NOTE — Progress Notes (Signed)
Speech Language Pathology Treatment: Dysphagia  Patient Details Name: William [name] MRN: [id number] DOB: [birth year] Today's Date: 03/26/2022 Time: 0160-1093 SLP Time Calculation (min) (ACUTE ONLY): 18 min  Assessment / Plan / Recommendation Clinical Impression  MBS was ordered by physician, but pt is currently on CRRT and not able to go down to radiology for MBS. Per chart, may come off CRRT as early as tomorrow to attempt iHD. RN and pt's daughter both report intermittent coughing with thin liquids today, and RN says nursing has had to crush his pills. Note that pt has had to crush some pills PTA and this does not appear to be an acute change (daughter present in agreement). Also note that pt has not been able to be fully upright while on CRRT, so he has been more reclined while consuming thin liquids. SLP used reverse Trendelenburg to get pt as upright as possible, with assistance from RN. Even while taking large, sequential straw sips, he had no overt s/s of aspiration. Suspect that positioning could certainly be impacting performance. Encouraged pt, daughter, and RN to try to optimize positioning as much as possible when offering POs, acknowledging that this might limit how much he can have at a time while still on CRRT. Hopefully, if he can come off this tomorrow, we can proceed with MBS to better evaluate oropharyngeal function. SLP still has suspicion for esophageal component given hx and report of symptoms - MD may wish to evaluate this further.    HPI HPI: Pt is an 82 year old male presenting with low blood pressure, SpO2 in the high 80s, confusion, and generalized weakness. Suspected bilateral multifocal PNA and severe sepsis. Head CT 1/19 reveals subacute occipital CVA. Swallow eval during recent admission was concerning for esophageal component. PMH includes PNA, reflux, GERD, combined CHF, ESRD on PD, HTN, HLD and BPH.      SLP Plan  MBS      Recommendations for follow up therapy are  one component of a multi-disciplinary discharge planning process, led by the attending physician.  Recommendations may be updated based on patient status, additional functional criteria and insurance authorization.    Recommendations  Diet recommendations: Regular;Thin liquid Liquids provided via: Cup;Straw Medication Administration: Crushed with puree Supervision: Staff to assist with self feeding Compensations: Small sips/bites;Slow rate;Follow solids with liquid Postural Changes and/or Swallow Maneuvers: Seated upright 90 degrees;Upright 30-60 min after meal                Oral Care Recommendations: Oral care BID Follow Up Recommendations: No SLP follow up Assistance recommended at discharge: Frequent or constant Supervision/Assistance SLP Visit Diagnosis: Dysphagia, unspecified (R13.10) Plan: MBS           Osie Bond., M.A. [place] Office [phone]  Secure chat preferred   03/26/2022, 1:25 PM

## 2022-03-26 NOTE — Evaluation (Signed)
Occupational Therapy Evaluation Patient Details Name: William [name] MRN: [id number] DOB: [birth year] Today's Date: 03/26/2022   History of Present Illness William [name] is a 82 y.o. male presented to ED with AMS found to have PNA with HF exacerbation and L PCA CVA. 1/20 LLR DVT, 1/21 CRRT. PMHx: chronic HFrEF, ESRD on PD, hypertension, hyperlipidemia, BPH recently admitted 12/29-1/11 for acute hypoxic respiratory failure secondary to multifocal pneumonia and was intubated 12/29-1/1   Clinical Impression   Art was evaluated s/p the above admission list, he is generally is mod I prior to last admission in December however needed some assist from his daughter since discharge. Upon evaluation he was limited by impaired cognition, generalized weakness, CRRT with IJ access, decreased balance and poor activity tolerance. Overall he needed max A +2 for bed mobility and mod A +2 to stand at EOB. He required maximal multimodal cues to initiate and sequence through all tasks, as well as reminders for orientation. Due to the deficits listed below, he also reuqires mod-total A fro ADLs. Pt assisted onto bed pan for BM at the end of the session. OT to continue to follow acutely. Recommend d/c to AIR for maximal functional progress to his mod I baseline.      Recommendations for follow up therapy are one component of a multi-disciplinary discharge planning process, led by the attending physician.  Recommendations may be updated based on patient status, additional functional criteria and insurance authorization.   Follow Up Recommendations  Acute inpatient rehab (3hours/day)     Assistance Recommended at Discharge Frequent or constant Supervision/Assistance  Patient can return home with the following A lot of help with walking and/or transfers;A lot of help with bathing/dressing/bathroom;Assistance with cooking/housework;Direct supervision/assist for medications management;Direct supervision/assist for  financial management;Assist for transportation;Help with stairs or ramp for entrance    Functional Status Assessment  Patient has had a recent decline in their functional status and demonstrates the ability to make significant improvements in function in a reasonable and predictable amount of time.  Equipment Recommendations  Other (comment) (defer)    Recommendations for Other Services Rehab consult     Precautions / Restrictions Precautions Precautions: Fall Precaution Comments: IJ CRRT Restrictions Weight Bearing Restrictions: No      Mobility Bed Mobility Overal bed mobility: Needs Assistance Bed Mobility: Supine to Sit, Sit to Supine     Supine to sit: Max assist, +2 for physical assistance, +2 for safety/equipment Sit to supine: Max assist, +2 for physical assistance, +2 for safety/equipment   General bed mobility comments: multimodal cues needed to initiate task    Transfers Overall transfer level: Needs assistance Equipment used: 2 person hand held assist Transfers: Sit to/from Stand Sit to Stand: Mod assist, +2 physical assistance, +2 safety/equipment                  Balance Overall balance assessment: Needs assistance Sitting-balance support: Feet supported Sitting balance-Leahy Scale: Fair     Standing balance support: Bilateral upper extremity supported, During functional activity Standing balance-Leahy Scale: Poor                             ADL either performed or assessed with clinical judgement   ADL Overall ADL's : Needs assistance/impaired Eating/Feeding: Minimal assistance   Grooming: Moderate assistance   Upper Body Bathing: Moderate assistance   Lower Body Bathing: Maximal assistance;+2 for physical assistance;+2 for safety/equipment;Sit to/from stand   Upper Body Dressing : Moderate  assistance;Sitting   Lower Body Dressing: Maximal assistance;+2 for physical assistance;+2 for safety/equipment;Sit to/from stand    Toilet Transfer: Maximal assistance;+2 for physical assistance;+2 for safety/equipment;Stand-pivot   Toileting- Clothing Manipulation and Hygiene: Maximal assistance;+2 for physical assistance;+2 for safety/equipment;Sit to/from stand       Functional mobility during ADLs: Maximal assistance;+2 for physical assistance;+2 for safety/equipment General ADL Comments: impiared cog, weakness, poor tolerance, IJ CRRT     Vision Patient Visual Report: No change from baseline Vision Assessment?: Vision impaired- to be further tested in functional context Additional Comments: per family report, pt with limited R superior visual field. pt tracking therapist and stimuli appropriately throughout session     Perception     [place] tested?: Not tested    Pertinent Vitals/Pain Pain Assessment Pain Assessment: Faces Faces Pain Scale: Hurts a little bit Pain Location: at IJ site Pain Descriptors / Indicators: Discomfort Pain Intervention(s): Limited activity within patient's tolerance, Monitored during session     Hand Dominance Left   Extremity/Trunk Assessment Upper Extremity Assessment Upper Extremity Assessment: Generalized weakness;RUE deficits/detail;LUE deficits/detail RUE Deficits / Details: poor grip strength, generally 3/5. denies sensation changes, required cues for functional use LUE Deficits / Details: globally weak 3+/5. limited overhead ROM, did not fully asses due to cognition. denies sensation changes   Lower Extremity Assessment Lower Extremity Assessment: Defer to PT evaluation   Cervical / Trunk Assessment Cervical / Trunk Assessment: Kyphotic   Communication Communication Communication: HOH   Cognition Arousal/Alertness: Awake/alert Behavior During Therapy: Flat affect Overall Cognitive Status: Impaired/Different from baseline Area of Impairment: Orientation, Attention, Memory, Following commands, Safety/judgement, Awareness, Problem solving                  Orientation Level: Disoriented to, Place, Situation, Person Current Attention Level: Sustained Memory: Decreased short-term memory, Decreased recall of precautions Following Commands: Follows one step commands consistently Safety/Judgement: Decreased awareness of safety, Decreased awareness of deficits Awareness: Emergent Problem Solving: Slow processing, Decreased initiation, Difficulty sequencing, Requires verbal cues General Comments: continuously stating "im a doctor." Only oriented to time of day. required simple one step commands with multimodal cues and time for processing.     General Comments  bleeding noted at IJ CRRT site, RN present to change bandage- otherwise pt stable throughout    Exercises     Shoulder Instructions      Home Living Family/patient expects to be discharged to:: Private residence Living Arrangements: Spouse/significant other;Children Available Help at Discharge: Available 24 hours/day Type of Home: House Home Access: Stairs to enter [employer] of Steps: 2 Entrance Stairs-Rails: None Home Layout: One level     Bathroom Shower/Tub: Walk-in shower         Home Equipment: Conservation officer, nature (2 wheels);Kasandra Knudsen - single point      Lives With: Spouse;Daughter    Prior Functioning/Environment Prior Level of Function : Independent/Modified Independent;Working/employed;Driving             Mobility Comments: per last admission: Wellsite geologist. Daughter PCA since discharge ADLs Comments: per last admission: mod I. Daughter assisting as needed since recent discharge        OT Problem List: Decreased strength;Decreased range of motion;Decreased activity tolerance;Impaired vision/perception;Impaired balance (sitting and/or standing);Decreased coordination;Decreased safety awareness;Decreased knowledge of use of DME or AE;Decreased knowledge of precautions      OT Treatment/Interventions:  Self-care/ADL training;Therapeutic exercise;DME and/or AE instruction;Therapeutic activities;Visual/perceptual remediation/compensation;Patient/family education;Balance training    OT Goals(Current goals can be found in the care plan  section) Acute Rehab OT Goals Patient Stated Goal: did not state OT Goal Formulation: With patient Time For Goal Achievement: 04/09/22 Potential to Achieve Goals: Good ADL Goals Pt Will Perform Grooming: with set-up;sitting Pt Will Perform Lower Body Dressing: with mod assist;sit to/from stand Pt Will Transfer to Toilet: with min guard assist;stand pivot transfer;bedside commode Additional ADL Goal #1: Pt will follow simple 1 step commands 100% of sessoin to complete functional tasks Additional ADL Goal #2: Pt will complete bed mobility with min G as a precursor to ADLs  OT Frequency: Min 2X/week    Co-evaluation PT/OT/SLP Co-Evaluation/Treatment: Yes Reason for Co-Treatment: Complexity of the patient's impairments (multi-system involvement);For patient/therapist safety;To address functional/ADL transfers   OT goals addressed during session: ADL's and self-care      AM-PAC OT "6 Clicks" Daily Activity     Outcome Measure Help from another person eating meals?: A Little Help from another person taking care of personal grooming?: A Little Help from another person toileting, which includes using toliet, bedpan, or urinal?: A Lot Help from another person bathing (including washing, rinsing, drying)?: A Lot Help from another person to put on and taking off regular upper body clothing?: A Lot Help from another person to put on and taking off regular lower body clothing?: A Lot 6 Click Score: 14   End of Session Equipment Utilized During Treatment: Oxygen (2L) Nurse Communication: Mobility status  Activity Tolerance: Patient tolerated treatment well Patient left: in bed;with call bell/phone within reach;with bed alarm set;with family/visitor present  OT  Visit Diagnosis: Unsteadiness on feet (R26.81);Other abnormalities of gait and mobility (R26.89);Muscle weakness (generalized) (M62.81);Pain                Time: 0174-9449 OT Time Calculation (min): 29 min Charges:  OT General Charges $OT Visit: 1 Visit OT Evaluation $OT Eval Moderate Complexity: 1 Mod    Chenel Wernli D Causey 03/26/2022, 11:04 AM

## 2022-03-26 NOTE — Progress Notes (Signed)
Inpatient Rehab Admissions Coordinator:  ? ?Per therapy recommendations,  patient was screened for CIR candidacy by Khristian Phillippi, MS, CCC-SLP. At this time, Pt. Appears to be a a potential candidate for CIR. I will place   order for rehab consult per protocol for full assessment. Please contact me any with questions. ? ?Fidencia Mccloud, MS, CCC-SLP ?Rehab Admissions Coordinator  ?[phone] (celll) ?[phone] (office) ? ?

## 2022-03-26 NOTE — PMR Pre-admission (Shared)
PMR Admission Coordinator Pre-Admission Assessment  Patient: William [name] is an 82 y.o., male MRN: [id number] DOB: [birth year] Height: 6' (182.9 cm) Weight: 80.3 kg  Insurance Information HMO: ***    PPO: ***     PCP: ***     IPA: ***     80/20: ***     OTHER: *** PRIMARY: [employer]      Policy#: [id number]      Subscriber: Patient CM Name: ***      Phone#: ***     Fax#: *** Pre-Cert#: ***      Employer: *** Benefits:  Phone #: [phone]     Name: *** Eff. Date: ***     Deduct: ***      Out of Pocket Max: ***      Life Max: *** CIR: ***      SNF: *** Outpatient: ***     Co-Pay: *** Home Health: ***      Co-Pay: *** DME: ***     Co-Pay: *** Providers: *** SECONDARY: ***      Policy#: ***     Phone#: ***  Financial Counselor: ***      Phone#: ***  The "Data Collection Information Summary" for patients in Inpatient Rehabilitation Facilities with attached "Privacy Act [place] Records" was provided and verbally reviewed with: Patient and Family  Emergency Contact Information Contact Information     Name Relation Home Work Mobile   Henery,Linda Spouse   [phone]   [facility] Daughter   [phone]   Nelso9n,Cindy Daughter   [phone]       Current Medical History  Patient Admitting Diagnosis: Acute Respiratory Failure History of Present Illness: William [name] is a 82 y.o. male with medical history significant of chronic HFrEF, ESRD on PD, hypertension, hyperlipidemia, BPH.  Recently admitted to [facility] 12/29-1/11 for acute hypoxic respiratory failure secondary to multifocal pneumonia and was intubated 12/29-1/1.  Hospital course complicated by NSTEMI requiring DES to mid LAD on 1/10 and delirium which required Precedex drip.  Given overall poor prognosis, he was made DNR/DNI.  He presented to [facility] ED  04/02/22 for generalized weakness, confusion, and very poor oral intake.  In the ED, patient was tachypneic and oxygen saturation 82%  on room air, placed on 2-3 L O2 with improvement of sats.  Temperature 100 F, not tachycardic.  Blood pressure low with systolic in the 24O.  Labs showing WBC 12.4, hemoglobin 9.8, platelet count 199k, BUN 76, creatinine 8.5, calcium 7.3, albumin 2.6, normal LFTs, UA pending, lactic acid 2.1> 1.5, INR 1.3, blood cultures drawn, VBG showing pH 7.39 and pCO2 43.4, COVID/influenza/RSV PCR pending, ammonia level normal, troponin 153> 143.  EKG without acute ischemic changes.  Chest x-ray showing developing bilateral opacities with effusions concerning for possible edema but secondary infiltrate is possible. His workup suggestive of acute hypoxic respiratory failure, possible uremic encephalopathy, CHF and pneumonia. 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 Therapies are recommending intensive rehab.     Patient's medical record from [facility] has been reviewed by the rehabilitation admission coordinator and physician.  Past Medical History  Past Medical History:  Diagnosis Date   Aortic insufficiency    Coronary artery disease    ESRD on peritoneal dialysis ([place])    Hyperlipidemia    Hypertension     Has the patient had major surgery during 100 days prior to admission?  Yes, coronary stent  Family History   family history includes Cancer in his mother; Cirrhosis in his father; Diabetes in his brother.  Current Medications  Current Facility-Administered Medications:     prismasol BGK 4/2.5 infusion, , CRRT, Continuous, Madelon Lips, MD, Last Rate: 500 mL/hr at 03/26/22 1255, New Bag at 03/26/22 1255    prismasol BGK 4/2.5 infusion, , CRRT, Continuous, Madelon Lips, MD, Last Rate: 400 mL/hr at 03/25/22 1615, New Bag at 03/25/22 1615   0.9 %  sodium chloride infusion, , Intravenous, PRN, Jacky Kindle, MD, Stopped at 03/25/22 1713   acetaminophen (TYLENOL) tablet 650  mg, 650 mg, Oral, Q6H PRN **OR** acetaminophen (TYLENOL) suppository 650 mg, 650 mg, Rectal, Q6H PRN, Shela Leff, MD   Ampicillin-Sulbactam (UNASYN) 3 g in sodium chloride 0.9 % 100 mL IVPB, 3 g, Intravenous, Q24H, Thurnell Lose, MD, Last Rate: 200 mL/hr at 03/26/22 1317, 3 g at 03/26/22 1317   apixaban (ELIQUIS) tablet 5 mg, 5 mg, Oral, BID, Thurnell Lose, MD, 5 mg at 03/26/22 1003   atorvastatin (LIPITOR) tablet 20 mg, 20 mg, Oral, Daily, Lala Lund K, MD, 20 mg at 03/26/22 1003   Chlorhexidine Gluconate Cloth 2 % PADS 6 each, 6 each, Topical, Daily, Thurnell Lose, MD, 6 each at 03/26/22 1003   clopidogrel (PLAVIX) tablet 75 mg, 75 mg, Oral, Daily, Thurnell Lose, MD, 75 mg at 03/26/22 1003   docusate sodium (COLACE) capsule 100 mg, 100 mg, Oral, Daily, Madelon Lips, MD, 100 mg at 03/26/22 1003   ezetimibe (ZETIA) tablet 10 mg, 10 mg, Oral, Daily, Lala Lund K, MD, 10 mg at 03/26/22 1003   feeding supplement (ENSURE ENLIVE / ENSURE PLUS) liquid 237 mL, 237 mL, Oral, BID BM, Thurnell Lose, MD, 237 mL at 03/26/22 1319   heparin injection 1,000-6,000 Units, 1,000-6,000 Units, CRRT, PRN, Madelon Lips, MD   LORazepam (ATIVAN) injection 1 mg, 1 mg, Intravenous, Once PRN, Alcario Drought, Jared M, DO   multivitamin (RENA-VIT) tablet 1 tablet, 1 tablet, Oral, QHS, Thurnell Lose, MD, 1 tablet at 03/25/22 2139   ondansetron (ZOFRAN) injection 4 mg, 4 mg, Intravenous, Q6H PRN, Thurnell Lose, MD, 4 mg at 03/25/22 1041   ondansetron (ZOFRAN-ODT) disintegrating tablet 4 mg, 4 mg, Oral, Q8H PRN, Thurnell Lose, MD   Oral care mouth rinse, 15 mL, Mouth Rinse, PRN, Candiss Norse, Margaree Mackintosh, MD   pantoprazole (PROTONIX) EC tablet 40 mg, 40 mg, Oral, Daily, Lala Lund K, MD, 40 mg at 03/26/22 1003   polyethylene glycol (MIRALAX / GLYCOLAX) packet 17 g, 17 g, Oral, Daily, Madelon Lips, MD, 17 g at 03/26/22 1002   prismasol BGK 4/2.5 infusion, , CRRT, Continuous,  Madelon Lips, MD, Last Rate: 2,000 mL/hr at 03/26/22 1255, New Bag at 03/26/22 1255   sevelamer carbonate (RENVELA) tablet 800 mg, 800 mg, Oral, TID WC, Thurnell Lose, MD, 800 mg at 03/26/22 1310  Patients Current Diet:  Diet Order             Diet regular Room service appropriate? Yes; Fluid consistency: Thin  Diet effective now                   Precautions / Restrictions Precautions Precautions: Fall Precaution Comments: IJ CRRT Restrictions Weight Bearing Restrictions: No   Has the patient had 2 or more falls or a fall with injury in the past year? No  Prior Activity Level    Prior Functional Level Self Care: Did the  patient need help bathing, dressing, using the toilet or eating? Independent  Indoor Mobility: Did the patient need assistance with walking from room to room (with or without device)? Independent  Stairs: Did the patient need assistance with internal or external stairs (with or without device)? Independent  Functional Cognition: Did the patient need help planning regular tasks such as shopping or remembering to take medications? Independent  Patient Information Are you of Hispanic, Latino/a,or Spanish origin?: A. No, not of Hispanic, Latino/a, or Spanish origin What is your race?: A. White Do you need or want an interpreter to communicate with a doctor or health care staff?: 0. No  Patient's Response To:  Health Literacy and Transportation Is the patient able to respond to health literacy and transportation needs?: Yes Health Literacy - How often do you need to have someone help you when you read instructions, pamphlets, or other written material from your doctor or pharmacy?: Never In the past 12 months, has lack of transportation kept you from medical appointments or from getting medications?: No In the past 12 months, has lack of transportation kept you from meetings, work, or from getting things needed for daily living?: No  Home Assistive  Devices / [place] Devices/Equipment: Wellsite geologist, Hearing aid (hearing aids are at hospital (per daughter [name]) but not on patient currently) Home Equipment: Conservation officer, nature (2 wheels), [employer] - single point  Prior Device Use: Indicate devices/aids used by the patient prior to current illness, exacerbation or injury? None of the above  Current Functional Level Cognition  Overall Cognitive Status: Impaired/Different from baseline Current Attention Level: Sustained Orientation Level: Oriented to person, Disoriented to place, Disoriented to time, Disoriented to situation Following Commands: Follows one step commands consistently Safety/Judgement: Decreased awareness of safety, Decreased awareness of deficits General Comments: continuously stating "im a doctor." Only oriented to time of day. required simple one step commands with multimodal cues and time for processing.    Extremity Assessment (includes Sensation/Coordination)  Upper Extremity Assessment: Generalized weakness, RUE deficits/detail, LUE deficits/detail RUE Deficits / Details: poor grip strength, generally 3/5. denies sensation changes, required cues for functional use LUE Deficits / Details: globally weak 3+/5. limited overhead ROM, did not fully asses due to cognition. denies sensation changes  Lower Extremity Assessment: Defer to PT evaluation    ADLs  Overall ADL's : Needs assistance/impaired Eating/Feeding: Minimal assistance Grooming: Moderate assistance Upper Body Bathing: Moderate assistance Lower Body Bathing: Maximal assistance, +2 for physical assistance, +2 for safety/equipment, Sit to/from stand Upper Body Dressing : Moderate assistance, Sitting Lower Body Dressing: Maximal assistance, +2 for physical assistance, +2 for safety/equipment, Sit to/from stand Toilet Transfer: Maximal assistance, +2 for physical assistance, +2 for safety/equipment, Stand-pivot Toileting- Clothing Manipulation and  Hygiene: Maximal assistance, +2 for physical assistance, +2 for safety/equipment, Sit to/from stand Functional mobility during ADLs: Maximal assistance, +2 for physical assistance, +2 for safety/equipment General ADL Comments: impiared cog, weakness, poor tolerance, IJ CRRT    Mobility  Overal bed mobility: Needs Assistance Bed Mobility: Supine to Sit, Sit to Supine Supine to sit: Max assist, +2 for physical assistance, +2 for safety/equipment Sit to supine: Max assist, +2 for physical assistance, +2 for safety/equipment General bed mobility comments: multimodal cues needed to initiate task    Transfers  Overall transfer level: Needs assistance Equipment used: 2 person hand held assist Transfers: Sit to/from Stand Sit to Stand: Mod assist, +2 physical assistance, +2 safety/equipment Bed to/from chair/wheelchair/BSC transfer type:: Step pivot Step pivot transfers: Min guard General transfer  comment: Assist to power up and balance from edge of bed; worked on weight shifting to R/L, bilateral knee instability, cues for quad activation    Ambulation / Gait / Stairs / Wheelchair Mobility  Ambulation/Gait General Gait Details: Too fatigued    Posture / Balance Balance Overall balance assessment: Needs assistance Sitting-balance support: Feet supported Sitting balance-Leahy Scale: Fair Standing balance support: Bilateral upper extremity supported, During functional activity Standing balance-Leahy Scale: Poor    Special needs/care consideration Skin ***   Previous Home Environment (from acute therapy documentation) Living Arrangements: Spouse/significant other, Children  Lives With: Spouse, Daughter Available Help at Discharge: Available 24 hours/day Type of Home: House Home Layout: One level Home Access: Stairs to enter Entrance Stairs-Rails: None Entrance Stairs-Number of Steps: 2 Bathroom Shower/Tub: Art gallery manager: Yes How Accessible: Accessible via  walker [place]: No  Discharge Living Setting Plans for Discharge Living Setting: Patient's home, Lives with (comment) (wife and daughter) Type of Home at Discharge: House Discharge Home Layout: One level Discharge Home Access: Stairs to enter Entrance Stairs-Rails: None Entrance Stairs-Number of Steps: 2 Discharge Bathroom Shower/Tub: Walk-in shower Discharge Bathroom Accessibility: Yes How Accessible: Accessible via walker Does the patient have any problems obtaining your medications?: No  Social/Family/Support Systems Anticipated Caregiver: spouse Vaughan Basta Anticipated Ambulance person Information: [phone] Caregiver Availability: 24/7 Discharge Plan Discussed with Primary Caregiver: Yes (met with patient and granddaughter) Is Caregiver In Agreement with Plan?: Yes Does Caregiver/Family have Issues with Lodging/Transportation while Pt is in Rehab?: No  Goals Patient/Family Goal for Rehab: Mod I PT, OT, independent with SLP Expected length of stay: 12-14 days Pt/Family Agrees to Admission and willing to participate: Yes Program Orientation Provided & Reviewed with Pt/Caregiver Including Roles  & Responsibilities: Yes  Barriers to Discharge: Insurance for SNF coverage  Decrease burden of Care through IP rehab admission: Othern/a  Possible need for SNF placement upon discharge: not anticipated  Patient Condition: I have reviewed medical records from [facility], spoken with  [facility] , and patient and family member. I met with patient at the bedside for inpatient rehabilitation assessment.  Patient will benefit from ongoing PT, OT, and SLP, can actively participate in 3 hours of therapy a day 5 days of the week, and can make measurable gains during the admission.  Patient will also benefit from the coordinated team approach during an Inpatient Acute Rehabilitation admission.  The patient will receive intensive therapy as well as Rehabilitation physician, nursing, social  worker, and care management interventions.  Due to safety, skin/wound care, disease management, medication administration, pain management, and patient education the patient requires 24 hour a day rehabilitation nursing.  The patient is currently *** with mobility and basic ADLs.  Discharge setting and therapy post discharge at home with home health is anticipated.  Patient has agreed to participate in the Acute Inpatient Rehabilitation Program and will admit {Time; today/tomorrow:10263}.  Preadmission Screen Completed By:  Nelly Laurence, 03/26/2022 2:33 PM ______________________________________________________________________   Discussed status with Dr. Marland Kitchen on *** at *** and received approval for admission today.  Admission Coordinator:  Nelly Laurence, time ***/Date ***   Assessment/Plan: Diagnosis: Does the need for close, 24 hr/day Medical supervision in concert with the patient's rehab needs make it unreasonable for this patient to be served in a less intensive setting? {yes_no_potentially:3041433} Co-Morbidities requiring supervision/potential complications: *** Due to {due OM:7672094}, does the patient require 24 hr/day rehab nursing? {yes_no_potentially:3041433} Does the patient require coordinated care of a physician, rehab nurse,  PT, OT, and SLP to address physical and functional deficits in the context of the above medical diagnosis(es)? {yes_no_potentially:3041433} Addressing deficits in the following areas: {deficits:3041436} Can the patient actively participate in an intensive therapy program of at least 3 hrs of therapy 5 days a week? {yes_no_potentially:3041433} The potential for patient to make measurable gains while on inpatient rehab is {potential:3041437} Anticipated functional outcomes upon discharge from inpatient rehab: {functional outcomes:304600100} PT, {functional outcomes:304600100} OT, {functional outcomes:304600100} SLP Estimated rehab length of stay to reach the  above functional goals is: *** Anticipated discharge destination: {anticipated dc setting:21604} 10. Overall Rehab/Functional Prognosis: {potential:3041437}   MD Signature: ***

## 2022-03-26 NOTE — Progress Notes (Signed)
Palliative:  HPI: 82 y.o. male  with past medical history of chronic HFrEF, ESRD on PD, hypertension, hyperlipidemia, and BPH admitted on 03/22/2022 with weakness, confusion, and poor p.o. intake. Recently admitted 12/29-1/11 for acute hypoxic respiratory failure secondary to multifocal pneumonia and was intubated 12/29-1/1.  Hospital course complicated by NSTEMI requiring DES to mid LAD on 1/10 and delirium which required Precedex drip.  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  MRI pending.  PMT consulted to discuss goals of care.   I reviewed records and following up from my colleague who consulted with William [name] 03/23/21. I met at bedside with William [name] and his granddaughter [name] at bedside. When I arrive [name] is very upset and William [name] is confused and escalating frustration. RN is attempting to reinforce HD cath site which has oozing blood at the site. I assisted to speak with William [name] and provide reassurance. I introduced myself and he expresses anger at people lying and not being honest with him. I reviewed with him that I will be honest and share with him anything that I know and his medical team will also be honest with him. We reviewed where he is, why, and reviewed the dialysis machine and catheter and why this is needed. He began to calm and granddaughter came back in the room and we discussed his career over the years and where he has lived in his life and his family originally from [place]. I left him more calm and in a better disposition. I explained that the doctors just want him to rest and take it easy while completing the dialysis today and he agrees with plan. I spoke with [name] and encouraged that when he becomes worked up to provide reassurance and distract him with discussing family or subjects that are more positive subjects for him to discuss. She also shares that his wife has been ill herself so family have been trying to relieve her and cover time at the  hospital to be with William [name]. She reports that her aunt [name] is POA and has been taking lead. I will reach out to [place] and/or [place] tomorrow to continue goals of care conversations.   All questions/concerns addressed. Emotional support provided.   Exam: Awake, alert, confused. Distressed from confusion and paranoia but talked down. HR NSR with occasional PVCs. Breathing regular, unlabored at rest. Abd flat. HD catheter to R IJ with bright red blood oozing.   Plan: - Need for ongoing goals of care. He is confused and unable to participate in these conversations at this time.   [place], NP Palliative Medicine Team Pager [phone] (Please see [email or web address] for schedule) Team Phone [phone]    Greater than 50%  of this time was spent counseling and coordinating care related to the above assessment and plan

## 2022-03-26 NOTE — Progress Notes (Signed)
Rounding Note    Patient Name: William [name] Date of Encounter: 03/27/2022  [place] Cardiologist: Janina Mayo, MD   Subjective   More calm on exam this morning but required precedex overnight due to agitation.  2.6L removed with CRRT BP soft this morning; precedex stopped.   Inpatient Medications    Scheduled Meds:  atorvastatin  20 mg Oral Daily   Chlorhexidine Gluconate Cloth  6 each Topical Daily   clopidogrel  75 mg Oral Daily   docusate sodium  100 mg Oral Daily   ezetimibe  10 mg Oral Daily   feeding supplement  237 mL Oral BID BM   multivitamin  1 tablet Oral QHS   pantoprazole  40 mg Oral Daily   polyethylene glycol  17 g Oral Daily   QUEtiapine  25 mg Oral BID   Continuous Infusions:   prismasol BGK 4/2.5 500 mL/hr at 03/27/22 0000    prismasol BGK 4/2.5 400 mL/hr at 03/26/22 1818   sodium chloride Stopped (03/27/22 0528)   ampicillin-sulbactam (UNASYN) IV Stopped (03/27/22 0511)   heparin 500 Units/hr (03/27/22 0715)   prismasol BGK 4/2.5 2,000 mL/hr at 03/27/22 0619   PRN Meds: sodium chloride, acetaminophen **OR** acetaminophen, clonazePAM, haloperidol lactate, heparin, ondansetron (ZOFRAN) IV, ondansetron, mouth rinse, Thrombi-Pad   Vital Signs    Vitals:   03/27/22 0600 03/27/22 0635 03/27/22 0700 03/27/22 0714  BP: 122/63  (!) 71/45 (!) 83/46  Pulse: 62 (!) 54 (!) 47 (!) 47  Resp: 18 (!) 21 (!) 22 (!) 21  Temp:      TempSrc:      SpO2: 98% 92% 95% 92%  Weight:      Height:        Intake/Output Summary (Last 24 hours) at 03/27/2022 0827 Last data filed at 03/27/2022 0715 Gross per 24 hour  Intake 614.2 ml  Output 3136 ml  Net -2521.8 ml      03/27/2022    5:00 AM 03/25/2022    3:00 PM 03/24/2022    9:00 AM  Last 3 Weights  Weight (lbs) 172 lb 13.5 oz 177 lb 0.5 oz 181 lb  Weight (kg) 78.4 kg 80.3 kg 82.1 kg      Telemetry    NSR/SB, occasional PVC - Personally Reviewed  ECG   No new tracing today - Personally  Reviewed  Physical Exam   GEN: Laying in bed, comfortable Neck: +JVD Cardiac: RRR, no murmurs Respiratory: Bibasilar crackles GI: Soft, nontender MS: No edema. Warm Neuro:  Confused but grossly nonfocal Psych: More calm  Labs    High Sensitivity Troponin:   Recent Labs  Lab 03/08/22 0917 03/08/22 1520 03/08/22 1850 03/22/22 1759 03/22/22 2341  TROPONINIHS 2,560* 1,534* 1,415* 153* 143*     Chemistry Recent Labs  Lab 03/22/22 1557 03/22/22 1605 03/25/22 0245 03/26/22 0453 03/26/22 1600 03/27/22 0444  NA 135   < > 138 139 136 137  136  K 4.7   < > 4.0 4.1 4.8 4.2  4.2  CL 94*   < > 96* 102 98 103  100  CO2 25   < > '22 26 25 25  25  '$ GLUCOSE 169*   < > 164* 97 95 106*  107*  BUN 76*   < > 87* 33* '22 12  12  '$ CREATININE 8.52*   < > 9.24* 3.58* 2.66* 1.65*  1.66*  CALCIUM 7.3*   < > 7.8* 7.5* 8.1* 7.9*  8.0*  MG  --    < >  2.3 2.3  --  2.5*  PROT 6.3*  --   --   --   --   --   ALBUMIN 2.6*  --   --  2.2* 2.6* 2.2*  AST 34  --   --   --   --   --   ALT 42  --   --   --   --   --   ALKPHOS 70  --   --   --   --   --   BILITOT 0.7  --   --   --   --   --   GFRNONAA 6*   < > 5* 16* 24* 42*  41*  ANIONGAP 16*   < > 20* '11 13 9  11   '$ < > = values in this interval not displayed.    Lipids  Recent Labs  Lab 03/23/22 0649  CHOL 86  TRIG 85  HDL 37*  LDLCALC 32  CHOLHDL 2.3    Hematology Recent Labs  Lab 03/25/22 0245 03/26/22 0453 03/27/22 0444  WBC 8.1 6.2 6.8  RBC 2.86* 2.62* 2.88*  HGB 9.6* 8.8* 9.5*  HCT 30.1* 28.1* 30.8*  MCV 105.2* 107.3* 106.9*  MCH 33.6 33.6 33.0  MCHC 31.9 31.3 30.8  RDW 14.7 14.6 14.7  PLT 258 247 278   Thyroid  Recent Labs  Lab 03/23/22 0650  TSH 1.029    BNP Recent Labs  Lab 03/24/22 0554 03/25/22 0245 03/26/22 0453  BNP >4,500.0* >4,500.0* 2,863.8*    DDimer No results for input(s): "DDIMER" in the last 168 hours.   Radiology    DG CHEST PORT 1 VIEW  Result Date: 03/25/2022 CLINICAL DATA:   Central line placement EXAM: PORTABLE CHEST 1 VIEW COMPARISON:  Chest radiograph 03/23/2022 FINDINGS: Monitoring leads overlie the patient. New right IJ central venous catheter tip projects over the expected location of the superior vena cava. Stable cardiomegaly. Similar bilateral airspace opacities. Probable small left pleural effusion. No pneumothorax. Thoracic spine degenerative changes. IMPRESSION: New right IJ central venous catheter tip projects over the expected location of the superior vena cava. Electronically Signed   By: Lovey Newcomer M.D.   On: 03/25/2022 15:04   DG Abd Portable 1V  Result Date: 03/25/2022 CLINICAL DATA:  Nausea. EXAM: PORTABLE ABDOMEN - 1 VIEW COMPARISON:  03/08/2022 FINDINGS: No gaseous bowel dilatation. Peritoneal dialysis catheter evident. Density over the central pelvis is compatible with a distended bladder. IMPRESSION: No evidence for bowel obstruction. No findings to explain the patient's history of pain. Electronically Signed   By: Misty Stanley M.D.   On: 03/25/2022 11:54    Cardiac Studies   TTE 03/24/22: IMPRESSIONS     1. Left ventricular ejection fraction, by estimation, is 25 to 30%. The  left ventricle has severely decreased function. The left ventricle  demonstrates global hypokinesis. The left ventricular internal cavity size  was severely dilated. There is mild  left ventricular hypertrophy. Left ventricular diastolic parameters are  indeterminate.   2. Right ventricular systolic function is moderately reduced. The right  ventricular size is mildly enlarged. Tricuspid regurgitation signal is  inadequate for assessing PA pressure.   3. The mitral valve is abnormal. Moderate mitral valve regurgitation.  Appears functional   4. The aortic valve was not well visualized. Aortic valve regurgitation  is mild.   5. Aortic dilatation noted. Aneurysm of the aortic root, measuring 48 mm.   6. The inferior vena cava is dilated in size  with <50% respiratory   variability, suggesting right atrial pressure of 15 mmHg.   7. Agitated saline contrast bubble study was positive with shunting  observed after >6 cardiac cycles suggestive of intrapulmonary shunting.    Patient Profile     82 y.o. male with history of ESRD on PD, CAD status post NSTEMI 03/04/2022 with DES to LAD, chronic combined systolic and diastolic heart failure with recent complex admission with acute hypoxic respiratory failure secondary to PNA requiring intubation with course complicated by NSTEMI and new HFrEF with EF 40-45%.   LHC on 03/12/2022 showed multivessel CAD with 80 to 90% stenosis of mid LAD, moderate to severe disease of D1, RPDA, distal LCx, R PLA B.  Underwent DES to mid LAD.  He now represents with generalized weakness and poor PO intake since recent discharge. Cardiology is consulted for acute on chronic HFrEF exacerbation.  Assessment & Plan   #Acute on chronic combined systolic and diastolic heart failure:  Patient presented with worsening weakness and poor PO intake found to have BNP >4500 with pleural effusions and pulmonary edema on CXR. TTE this admission showed EF 25 to 30%, moderate RV dysfunction, moderate MR  from EF 40-45% on 02/2022. Was started on CRRT for volume management as was failing PD. Unable to tolerate GDMT due to soft blood pressures.  -Continue CRRT for volume management -Unable to tolerate GDMT due to hypotension  #Multivessel CAD:  Status post DES to LAD 03/14/2022.  Troponin on this admission elevated at 153 > 143, likely demand ischemia in setting of acute illness.   -Continue Lipitor '20mg'$  daily, Zetia '10mg'$  daily -Plan for plavix and apixaban long-term due to presence of LE DVT  #Acute left lower extremity DVT:  -On heparin gtt for now; planned for apixaban long-term  #Acute hypoxic respiratory failure:  Likely multifactorial in setting of recurrent pneumonia and volume overload. Currently improved  and back on RA.  #Subacute CVA:  Noted  on head CT on admission.  Head CT did show petechial hemorrhage but okay to continue Eliquis/Plavix given recent DES placement per neurology.  #ESRD: On PD at home but has been started on CRRT for volume management during admission given significant volume overload and soft BP with drop in EF. -Management per Nephrology  #Goals of care:  -Palliative consulted  Cardiology will sign off. On CRRT for volume management and cannot tolerate GDMT due to hypotension. Palliative care involved. Please call with questions or concerns. Will arrange for follow-up for patient once he nears discharge.    For questions or updates, please contact [place] Please consult [email or web address] for contact info under        Signed, Freada Bergeron, MD  03/27/2022, 8:27 AM

## 2022-03-26 NOTE — Progress Notes (Signed)
ANTICOAGULATION CONSULT NOTE - Follow Up Consult  Pharmacy Consult for apixaban > heparin Indication: DVT  Allergies  Allergen Reactions   Nsaids Other (See Comments)    Chronic kidney disease   Valacyclovir Nausea And Vomiting   Lisinopril Rash    Patient Measurements: Height: 6' (182.9 cm) Weight: 80.3 kg (177 lb 0.5 oz) IBW/kg (Calculated) : 77.6  Vital Signs: Temp: 97.5 F (36.4 C) (01/22 1139) Temp Source: Oral (01/22 1139) BP: 121/64 (01/22 1500) Pulse Rate: 66 (01/22 1500)  Labs: Recent Labs    03/24/22 0554 03/25/22 0245 03/26/22 0453  HGB 9.3* 9.6* 8.8*  HCT 29.2* 30.1* 28.1*  PLT 223 258 247  CREATININE 9.23* 9.24* 3.58*     Estimated Creatinine Clearance: 18.1 mL/min (A) (by C-G formula based on SCr of 3.58 mg/dL (H)).   Medical History: Past Medical History:  Diagnosis Date   Aortic insufficiency    Coronary artery disease    ESRD on peritoneal dialysis (HCC)    Hyperlipidemia    Hypertension     Medications:  Scheduled:   atorvastatin  20 mg Oral Daily   Chlorhexidine Gluconate Cloth  6 each Topical Daily   clopidogrel  75 mg Oral Daily   docusate sodium  100 mg Oral Daily   ezetimibe  10 mg Oral Daily   feeding supplement  237 mL Oral BID BM   multivitamin  1 tablet Oral QHS   pantoprazole  40 mg Oral Daily   polyethylene glycol  17 g Oral Daily    Assessment: 81 YOM s/p DES 1/10 started on dapt then noted to have occipital lobe stroke via head CT, then found to found to have DVT started on apixaban 1/20 - load avoided with recent stroke and dapt. Asa dropped per cardiology after apixaban started.   Patient now on CRRT in ICU and will hold apixaban and start IV heparin drip Hgb 8.8 watch - oozing from HD IV site  - keep heparin drip at low end of range  Will titrate heparin by aptt with recent apixaban interfering with heparin level   Goal of Therapy:  Aptt goal 60-80 sec  Monitor platelets by anticoagulation protocol: Yes    Plan:  Stop apixaban  - last dose 1/22 am  Start heparin drip 500 uts/hr   Monitor s/s bleeding  Daily aptt and cbc    Bonnita Nasuti Pharm.D. CPP, BCPS Clinical Pharmacist [phone] 03/26/2022 3:35 PM

## 2022-03-26 NOTE — Progress Notes (Signed)
Physical Therapy Treatment Patient Details Name: William [name] MRN: [id number] DOB: 30-May-1940 Today's Date: 03/26/2022   History of Present Illness William [name] is a 82 y.o. male presented to ED with AMS found to have PNA with HF exacerbation and L PCA CVA. 1/20 LLR DVT, 1/21 CRRT. PMHx: chronic HFrEF, ESRD on PD, hypertension, hyperlipidemia, BPH recently admitted 12/29-1/11 for acute hypoxic respiratory failure secondary to multifocal pneumonia and was intubated 12/29-1/1    PT Comments    Pt seen on CRRT with IJ access; session somewhat limited due to increased drainage at site. RN present to change dressing. Pt requiring two person mod-max assist for bed mobility and standing edge of bed. Requires multimodal cueing to initiate and sequence throughout tasks, as well as reminders for orientation. 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 Towards end of session, pt reporting need to have BM and placed on bed pan. Pt demonstrates impaired cognition, balance, strength, vision, coordination. Updated d/c plan in light of deficits and medical complexities to AIR. 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    Recommendations for follow up therapy are one component of a multi-disciplinary discharge planning process, led by the attending physician.  Recommendations may be updated based on patient status, additional functional criteria and insurance authorization.  Follow Up Recommendations  Acute inpatient rehab (3hours/day)     Assistance Recommended at Discharge Frequent or constant Supervision/Assistance  Patient can return home with the following A little help with bathing/dressing/bathroom;Assistance with cooking/housework;Assist for transportation;Help with stairs or ramp for entrance;Direct supervision/assist for medications management;Direct supervision/assist for financial management;A lot of help with walking and/or transfers    Equipment Recommendations  Other (comment) (TBA)    Recommendations for Other Services       Precautions / Restrictions Precautions Precautions: Fall Precaution Comments: IJ CRRT Restrictions Weight Bearing Restrictions: No     Mobility  Bed Mobility Overal bed mobility: Needs Assistance Bed Mobility: Supine to Sit, Sit to Supine     Supine to sit: Max assist, +2 for physical assistance, +2 for safety/equipment Sit to supine: Max assist, +2 for physical assistance, +2 for safety/equipment   General bed mobility comments: multimodal cues needed to initiate task    Transfers Overall transfer level: Needs assistance Equipment used: 2 person hand held assist Transfers: Sit to/from Stand Sit to Stand: Mod assist, +2 physical assistance, +2 safety/equipment           General transfer comment: Assist to power up and balance from edge of bed; worked on weight shifting to R/L, bilateral knee instability, cues for quad activation    Ambulation/Gait                   Stairs             Wheelchair Mobility    Modified Rankin (Stroke Patients Only) Modified Rankin (Stroke Patients Only) Pre-Morbid Rankin Score: No symptoms Modified Rankin: Severe disability     Balance Overall balance assessment: Needs assistance Sitting-balance support: Feet supported Sitting balance-Leahy Scale: Fair     Standing balance support: Bilateral upper extremity supported, During functional activity Standing balance-Leahy Scale: Poor                              Cognition Arousal/Alertness: Awake/alert Behavior During Therapy: Flat affect Overall Cognitive Status: Impaired/Different from baseline Area of Impairment: Orientation, Attention, Memory, Following commands, Safety/judgement, Awareness, Problem solving  Orientation Level: Disoriented to, Place, Situation, Person Current Attention Level: Sustained Memory: Decreased  short-term memory, Decreased recall of precautions Following Commands: Follows one step commands consistently Safety/Judgement: Decreased awareness of safety, Decreased awareness of deficits Awareness: Emergent Problem Solving: Slow processing, Decreased initiation, Difficulty sequencing, Requires verbal cues General Comments: continuously stating "im a doctor." Only oriented to time of day. required simple one step commands with multimodal cues and time for processing.        Exercises General Exercises - Lower Extremity Ankle Circles/Pumps: Both, 5 reps, Other (comment) (bed in chair position) Long Arc Quad: Both, 10 reps, Other (comment) (bed in chair position)    General Comments General comments (skin integrity, edema, etc.): VSS      Pertinent Vitals/Pain Pain Assessment Pain Assessment: Faces Faces Pain Scale: Hurts a little bit Pain Location: at IJ site Pain Descriptors / Indicators: Discomfort Pain Intervention(s): Monitored during session    Home Living Family/patient expects to be discharged to:: Private residence Living Arrangements: Spouse/significant other;Children Available Help at Discharge: Available 24 hours/day Type of Home: House Home Access: Stairs to enter Entrance Stairs-Rails: None Entrance Stairs-Number of Steps: 2   Home Layout: One level Home Equipment: Conservation officer, nature (2 wheels);Cane - single point      Prior Function            PT Goals (current goals can now be found in the care plan section) Acute Rehab PT Goals PT Goal Formulation: With patient/family Time For Goal Achievement: 04/06/22 Potential to Achieve Goals: Good Progress towards PT goals: Progressing toward goals    Frequency    Min 4X/week      PT Plan Discharge plan needs to be updated    Co-evaluation PT/OT/SLP Co-Evaluation/Treatment: Yes Reason for Co-Treatment: Complexity of the patient's impairments (multi-system involvement);To address functional/ADL  transfers;For patient/therapist safety PT goals addressed during session: Mobility/safety with mobility OT goals addressed during session: ADL's and self-care      AM-PAC PT "6 Clicks" Mobility   Outcome Measure  Help needed turning from your back to your side while in a flat bed without using bedrails?: A Lot Help needed moving from lying on your back to sitting on the side of a flat bed without using bedrails?: Total Help needed moving to and from a bed to a chair (including a wheelchair)?: Total Help needed standing up from a chair using your arms (e.g., wheelchair or bedside chair)?: Total Help needed to walk in hospital room?: Total Help needed climbing 3-5 steps with a railing? : Total 6 Click Score: 7    End of Session Equipment Utilized During Treatment: Gait belt Activity Tolerance: Patient tolerated treatment well Patient left: in bed;with call bell/phone within reach;with bed alarm set;with family/visitor present Nurse Communication: Mobility status PT Visit Diagnosis: Unsteadiness on feet (R26.81);Muscle weakness (generalized) (M62.81);Difficulty in walking, not elsewhere classified (R26.2);Other symptoms and signs involving the nervous system (A63.016)     Time: 0109-3235 PT Time Calculation (min) (ACUTE ONLY): 29 min  Charges:  $Therapeutic Activity: 8-22 mins                     Wyona Almas, PT, DPT Acute Rehabilitation Services Office [phone]    Deno Etienne 03/26/2022, 11:13 AM

## 2022-03-26 NOTE — Progress Notes (Addendum)
Subjective:  Overnight:NAEON  More alert and oriented this am. Denying any acute concerns.   Objective:  Vital signs in last 24 hours: Vitals:   03/26/22 0300 03/26/22 0400 03/26/22 0500 03/26/22 0600  BP: 123/69 (!) 107/52 (!) 115/54 118/63  Pulse: (!) 56 (!) 54 (!) 55 60  Resp: '18 16 12 17  '$ Temp:  (!) 96.5 F (35.8 C)  (!) 96.8 F (36 C)  TempSrc:  Axillary  Axillary  SpO2: 100% 100% 100% 98%  Weight:      Height:       Supplemental O2: Nasal Cannula Last BM Date : 03/20/22 (per family) SpO2: 98 % O2 Flow Rate (L/min): 2 L/min Filed Weights   03/22/22 1557 03/24/22 0900 03/25/22 1500  Weight: 83.9 kg 82.1 kg 80.3 kg    Intake/Output Summary (Last 24 hours) at 03/26/2022 0827 Last data filed at 03/26/2022 0800 Gross per 24 hour  Intake 247.29 ml  Output 1676 ml  Net -1428.71 ml    Net IO Since Admission: -1,280.78 mL [03/26/22 0827]  Physical Exam  General: ill appearing elderly male HENT: NCAT Lungs: CTAB anteriorly Cardiovascular: NSR, good pulses in extremities Abdomen: No TTP, normal bowel sounds MSK: Moving all extremities.  Good strength in the lower extremity.  Neuro: CN II-XII grossly intact.  No focal deficits noted on exam.  Patient alert but oriented to self only.   Psych: Normal mood and normal affect  Diagnostics    Latest Ref Rng & Units 03/26/2022    4:53 AM 03/25/2022    2:45 AM 03/24/2022    5:54 AM  CBC  WBC 4.0 - 10.5 K/uL 6.2  8.1  7.3   Hemoglobin 13.0 - 17.0 g/dL 8.8  9.6  9.3   Hematocrit 39.0 - 52.0 % 28.1  30.1  29.2   Platelets 150 - 400 K/uL 247  258  223        Latest Ref Rng & Units 03/26/2022    4:53 AM 03/25/2022    2:45 AM 03/24/2022    5:54 AM  CMP  Glucose 70 - 99 mg/dL 97  164  128   BUN 8 - 23 mg/dL 33  87  86   Creatinine 0.61 - 1.24 mg/dL 3.58  9.24  9.23   Sodium 135 - 145 mmol/L 139  138  138   Potassium 3.5 - 5.1 mmol/L 4.1  4.0  4.4   Chloride 98 - 111 mmol/L 102  96  96   CO2 22 - 32 mmol/L '26  22  22    '$ Calcium 8.9 - 10.3 mg/dL 7.5  7.8  7.7     Procalcitonin 0.70 CRP 22.1-->16.8 Magnesium 2.3 BNP >4.5k  MR BRAIN WO CONTRAST  Result Date: 03/23/2022 IMPRESSION: Only diffusion images were acquired due to the patient's inability to tolerate the exam. 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 No other areas of restricted diffusion. Electronically Signed   By: Emmit Alexanders M.D.   On: 03/23/2022 14:04   CT HEAD WO CONTRAST (5MM)  Result Date: 03/23/2022 IMPRESSION: Suspected subacute infarct in the left occipital lobe with possible petechial hemorrhage but no mass effect. Recommend brain MRI for better evaluation. These results will be called to the ordering clinician or representative by the Radiologist Assistant, and communication documented in the PACS or [employer]. Electronically Signed   By: Valetta Mole M.D.   On: 03/23/2022 09:38   DG Chest Port 1 View  Result Date: 03/23/2022 CLINICAL DATA:  Shortness of breath.  Concern for pneumonia. EXAM: PORTABLE CHEST 1 VIEW COMPARISON:  03/22/2022 FINDINGS: Unchanged cardiac enlargement. Decreased left pleural effusion. Diffuse bilateral interstitial and airspace opacities are again noted. Compared with the previous exam there is been improved aeration to both lower lung zones. IMPRESSION: 1. Persistent bilateral interstitial and airspace opacities with improved aeration to both lower lung zones. 2. Decrease in left pleural effusion. Electronically Signed   By: Kerby Moors M.D.   On: 03/23/2022 06:29   Assessment/Plan: Tzvi Economou is a 82 y.o. male with medical history significant of chronic HFrEF, ESRD on PD, hypertension, hyperlipidemia, BPH recently admitted 12/29-1/11 for acute hypoxic respiratory failure secondary to multifocal pneumonia and was intubated 12/29-1/1 presented to ED with AMS found to have PNA with HF exacerbation  and acute stroke.    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 Secondary to combined pneumonia and HF exacerbation. Procal elevated. Pt on Unasyn day 4. Plan to continue abx. Pt initiated on CRRT for fluid removal yesterday and 1.6 L removed since initiation.   Metabolic encephalopathy Multifactorial. Improved. Due to combination of acute hypoxic respiratory failure, CHF and pneumonia. Was worsened by uremic encephalopathy (see plan below). Now improved BUN decreased from 87 to 33 this am. Mental status improved. Pt previously has had issues with delirium during his last hospital stay as well requiring Precedex drip, minimize benzodiazepines narcotics, as needed IM Haldol, nighttime Seroquel and monitor. Delirium precautions ordered.   ESRD on home PD.  Volume status improved.  Nephrology following and patient on midodrine 5 mg TID. Pt on CRRT and has 1.6 L removed. BUN decreased from 87 to 33 this am. Mental status improved. Will continue CRRT for now.   CAD with chronic systolic heart failure due to ischemic cardiomyopathy EF 25% on recent echocardiogram.  Recent history of left heart cath showing multivessel CAD requiring DES to mid LAD on 03/14/2022.   Appears to be in CHF exacerbation again. Recent EF low at 25-30 %. Some intrapulmonary shunting noted. More euvolemic today compared to yesterday. Pt on Eliquis and Plavix. Cardiology  following.    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 MRI shows acute stroke in the left PCA territory. Unable to obtain MRA of the neck yesterday due to encephalopathy. Pt was on DAPT due to recent stent placement. Recent echo shows slight decease in EF at 45 % with G1DD. A1c normal on 03/14/22. Neurology following and plan to attempt further imaging if mental status allows. Carotid and intracranial vascular ultrasound without any stenosis. Echo with bubble study without any shunt but worsened EF from 45 % to 25 % and LV with global hypokinesis. Acute DVT of LLE seen so pt regimen changed on 03/24/21 to plavix and eliquis with discontinuation of aspirin.  Dyslipidemia.  Chronic. LDL 32. Continue statin and Zetia.   Hypertension.  Chronic. Currently on beta-blocker.   Chronic macrocytic anemia.  B12, TSH stable.  PCP to monitor outpatient.   GERD - PPI   Diet: Normal IVF: None,None VTE: Heparin Code: DNR PT/OT recs: Home Health,  Prior to Admission Living Arrangement: Home Anticipated Discharge Location: Home Barriers to Discharge: Medical workup  Dispo: Anticipated discharge in approximately 1-2 day(s).   Idamae Schuller, MD Tillie Rung. [facility] Internal Medicine Residency, PGY-2  Pager: [phone] After 5 pm and on weekends: Please call the on-call pager    Attestation -   I have directly reviewed the clinical findings, lab results and imaging studies. I have interviewed and examined the patient and agree with the documentation and management as recorded by the Dr Humphrey Rolls, patient admitted for acute stroke, fluid overload, recent MI requiring DES stent to mid LAD. This admission found to be in CHF, new DVT and new stroke, now developing worsening uremia. Was on home PD but started developing signs of uremia and was started on CRRT on 03/25/2022 with gradual but definite improvement.  Continue CRRT.  Being followed by neurology, cardiology and  nephrology closely.

## 2022-03-26 NOTE — Consult Note (Signed)
NAME:  William [name], MRN:  [id number], DOB:  1941-01-18, LOS: 3 ADMISSION DATE:  03/22/2022, CONSULTATION DATE:  03/25/22 REFERRING MD:  Candiss Norse - [facility] , CHIEF COMPLAINT:  need for CRRT   History of Present Illness:  82 yo M PMH ESRD on PD, CAD with recent NSTEMI (03/04/22) and recent DES to LAD (7/51/02), chronic systolic and diastolic HF who was admitted 1/19 for generalized weakness. Found to have decompensated HF, likely PNA. Started on abx, nephro and cards consulted. 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 On 1/20 a Lower extremity US revealed LLE DVT. DAPT was changed to plavix and eliquis. On 1/21 a decision was reached to attempt CRRT for a couple of days as a means of volume removal.   PCCM is consulted in this setting   Pertinent  Medical History  CAD NSTEMI ICM Chronic systolic and diastolic HF  CVA DVT ESRD   Significant Hospital Events: Including procedures, antibiotic start and stop dates in addition to other pertinent events   1/19 admit for wkness. Decomp HF. L PCA CVA. Neuro, cards, nephro consult  1/20 LLE DVT. Changed DAPT to eliquis plavix 1/21 decision for CRRT for volume removal. PCCM consulted   Interim History / Subjective:  HD catheter was placed yesterday evening, patient was started on CRRT, tolerating well so far Remained afebrile  Objective   Blood pressure 118/63, pulse 60, temperature (!) 96.8 F (36 C), temperature source Axillary, resp. rate 17, height 6' (1.829 m), weight 80.3 kg, SpO2 98 %.        Intake/Output Summary (Last 24 hours) at 03/26/2022 5852 Last data filed at 03/26/2022 0800 Gross per 24 hour  Intake 247.29 ml  Output 1676 ml  Net -1428.71 ml   Filed Weights   03/22/22 1557 03/24/22 0900 03/25/22 1500  Weight: 83.9 kg 82.1 kg 80.3 kg    Examination: Physical exam: General: Chronically ill-appearing male, lying on the bed HEENT: [place]/AT, eyes anicteric.  moist mucus membranes Neuro: Awake, confused, following simple  commands, moving all 4 extremities Chest: Bilateral basal crackles, no wheezes or rhonchi Heart: Regular rate and rhythm, no murmurs or gallops Abdomen: Soft, nontender, nondistended, bowel sounds present Skin: No rash   Resolved Hospital Problem list     Assessment & Plan:  Acute on chronic HFrEF congestive heart failure Echocardiogram showed EF less than 20% Patient is not on GDMT considering borderline low blood pressure His heart rate is in 60s, cannot restart beta-blocker He may need to be started on low-dose Imdur if blood pressure allows Monitor intake and output  Acute hypoxic respiratory failure due to acute pulmonary edema Probable aspiration pneumonia Currently on nasal cannula oxygen, titrate with O2 sat goal 92% On CRRT to remove volume Continue IV antibiotics with Unasyn Blood cultures are negative Sputum culture is pending  Acute metabolic encephalopathy Due to hypoxia and uremia Avoid sedation   Acute LLE DVT Subacute left PCA territory stroke Continue Eliquis Continue Plavix Needs MRI brain once stable  ESRD on PD Anion gap metabolic acidosis Patient is on peritoneal dialysis at home, probably he will end up needing intermittent hemodialysis Currently on CRRT due to acute volume overload, plan is to transition to IHD by tomorrow Nephrology is following  CAD, recent NSTEMI  s/p recent DES to LAD Continue Plavix and statins  Best Practice (right click and "Reselect all SmartList Selections" daily)   Diet/type: Regular DVT prophylaxis: DOAC GI prophylaxis: N/A Lines: HD catheter, yes still needed Foley:  N/A Code Status:  DNR Last date of multidisciplinary goals of care discussion [daughter updated 1/21]  Labs   CBC: Recent Labs  Lab 03/22/22 1557 03/22/22 1605 03/23/22 0650 03/24/22 0554 03/25/22 0245 03/26/22 0453  WBC 12.4*  --  10.6* 7.3 8.1 6.2  NEUTROABS 10.8*  --   --  5.9 6.7 4.9  HGB 9.8* 10.9* 9.0* 9.3* 9.6* 8.8*  HCT 30.7*  32.0* 28.1* 29.2* 30.1* 28.1*  MCV 105.1*  --  104.5* 105.4* 105.2* 107.3*  PLT 199  --  189 223 258 856    Basic Metabolic Panel: Recent Labs  Lab 03/22/22 1557 03/22/22 1605 03/23/22 0650 03/24/22 0554 03/25/22 0245 03/26/22 0453  NA 135 132* 136 138 138 139  K 4.7 4.7 4.9 4.4 4.0 4.1  CL 94*  --  95* 96* 96* 102  CO2 25  --  '22 22 22 26  '$ GLUCOSE 169*  --  109* 128* 164* 97  BUN 76*  --  84* 86* 87* 33*  CREATININE 8.52*  --  9.44* 9.23* 9.24* 3.58*  CALCIUM 7.3*  --  7.3* 7.7* 7.8* 7.5*  MG  --   --  2.1 2.2 2.3 2.3  PHOS  --   --   --   --   --  3.8   GFR: Estimated Creatinine Clearance: 18.1 mL/min (A) (by C-G formula based on SCr of 3.58 mg/dL (H)). Recent Labs  Lab 03/22/22 1557 03/22/22 1756 03/23/22 0650 03/24/22 0554 03/25/22 0245 03/26/22 0453  PROCALCITON  --   --  0.60 0.70 0.60 0.13  WBC 12.4*  --  10.6* 7.3 8.1 6.2  LATICACIDVEN 2.1* 1.5  --   --   --   --     Liver Function Tests: Recent Labs  Lab 03/22/22 1557 03/26/22 0453  AST 34  --   ALT 42  --   ALKPHOS 70  --   BILITOT 0.7  --   PROT 6.3*  --   ALBUMIN 2.6* 2.2*   No results for input(s): "LIPASE", "AMYLASE" in the last 168 hours. Recent Labs  Lab 03/22/22 1657  AMMONIA 12    ABG    Component Value Date/Time   PHART 7.396 03/04/2022 1116   PCO2ART 29.2 (L) 03/04/2022 1116   PO2ART 79 (L) 03/04/2022 1116   HCO3 26.6 03/22/2022 1605   TCO2 28 03/22/2022 1605   ACIDBASEDEF 6.0 (H) 03/04/2022 1116   O2SAT 22 03/22/2022 1605     Coagulation Profile: Recent Labs  Lab 03/22/22 1557  INR 1.3*    Cardiac Enzymes: No results for input(s): "CKTOTAL", "CKMB", "CKMBINDEX", "TROPONINI" in the last 168 hours.  HbA1C: Hgb A1c MFr Bld  Date/Time Value Ref Range Status  03/14/2022 01:34 AM 5.5 4.8 - 5.6 % Final    Comment:    (NOTE) Pre diabetes:          5.7%-6.4%  Diabetes:              >6.4%  Glycemic control for   <7.0% adults with diabetes     CBG: Recent Labs   Lab 03/22/22 1556 03/23/22 1322  GLUCAP 150* 106*    Review of Systems:   Unable to obtain due to encephalopathy   Past Medical History:  He,  has a past medical history of Aortic insufficiency, Coronary artery disease, ESRD on peritoneal dialysis ([place]), Hyperlipidemia, and Hypertension.   Surgical History:   Past Surgical History:  Procedure Laterality Date   APPENDECTOMY  CARDIOVASCULAR STRESS TEST  06/08/2009   EF 43%   CHOLECYSTECTOMY     CORONARY STENT INTERVENTION N/A 03/14/2022   Procedure: CORONARY STENT INTERVENTION;  Surgeon: Troy Sine, MD;  Location: [facility];  Service: Cardiovascular;  Laterality: N/A;   FOOT SURGERY     HERNIA REPAIR     LEFT HEART CATH AND CORONARY ANGIOGRAPHY N/A 03/12/2022   Procedure: LEFT HEART CATH AND CORONARY ANGIOGRAPHY;  Surgeon: Nelva Bush, MD;  Location: [facility];  Service: Cardiovascular;  Laterality: N/A;   US ECHOCARDIOGRAPHY  05/24/2009   EF 60%     Social History:   reports that he quit smoking about 56 years ago. His smoking use included cigarettes. He has never used smokeless tobacco. He reports that he does not drink alcohol and does not use drugs.   Family History:  His family history includes Cancer in his mother; Cirrhosis in his father; Diabetes in his brother.   Allergies Allergies  Allergen Reactions   Nsaids Other (See Comments)    Chronic kidney disease   Valacyclovir Nausea And Vomiting   Lisinopril Rash     Home Medications  Prior to Admission medications   Medication Sig Start Date End Date Taking? Authorizing Provider  acetaminophen (TYLENOL) 325 MG tablet Take 2 tablets (650 mg total) by mouth every 6 (six) hours as needed for headache or mild pain. 03/15/22  Yes Mercy Riding, MD  aspirin 81 MG chewable tablet Chew 1 tablet (81 mg total) by mouth daily. 03/16/22 03/16/23 Yes Mercy Riding, MD  atorvastatin (LIPITOR) 20 MG tablet Take 1 tablet (20 mg total) by mouth daily.  03/16/22 06/14/22 Yes Mercy Riding, MD  clopidogrel (PLAVIX) 75 MG tablet Take 1 tablet (75 mg total) by mouth daily. 03/16/22 03/16/23 Yes Mercy Riding, MD  ezetimibe (ZETIA) 10 MG tablet Take 1 tablet (10 mg total) by mouth daily. 03/16/22 06/14/22 Yes Mercy Riding, MD  melatonin 3 MG TABS tablet Take 1 tablet (3 mg total) by mouth at bedtime. 03/15/22  Yes Mercy Riding, MD  metoprolol tartrate (LOPRESSOR) 25 MG tablet Take 1 tablet (25 mg total) by mouth 2 (two) times daily. 03/15/22 09/11/22 Yes Mercy Riding, MD  ondansetron (ZOFRAN) 4 MG tablet Take 1 tablet (4 mg total) by mouth daily as needed for up to 365 doses for nausea or vomiting. 03/15/22  Yes Mercy Riding, MD  pantoprazole (PROTONIX) 40 MG tablet Take 1 tablet (40 mg total) by mouth daily. 03/16/22 04/15/22 Yes Mercy Riding, MD  sevelamer carbonate (RENVELA) 800 MG tablet Take 1 tablet (800 mg total) by mouth 3 (three) times daily with meals. 03/15/22 06/13/22 Yes Mercy Riding, MD      Jacky Kindle, MD [facility] Critical [facility] See Amion for pager If no response to pager, please call [phone] until 7pm After 7pm, Please call E-link [phone]

## 2022-03-26 NOTE — Progress Notes (Signed)
Inpatient Rehab Coordinator Note:  I met with patient and granddaughter at bedside to discuss CIR recommendations and goals/expectations of CIR stay.  We reviewed 3 hrs/day of therapy, physician follow up, and average length of stay 2 weeks (dependent upon progress) with goals of supervision.  Granddaughter and patient are interested in pursuing CIR. Will continue to follow for progress with therapy and medical readiness to open with insurance for prior authorization.   Rehab Admissons Coordinator [place], [place], [place] [phone]

## 2022-03-27 DIAGNOSIS — G9341 Metabolic encephalopathy: Secondary | ICD-10-CM | POA: Diagnosis not present

## 2022-03-27 DIAGNOSIS — I5043 Acute on chronic combined systolic (congestive) and diastolic (congestive) heart failure: Secondary | ICD-10-CM | POA: Diagnosis not present

## 2022-03-27 DIAGNOSIS — Z992 Dependence on renal dialysis: Secondary | ICD-10-CM | POA: Diagnosis not present

## 2022-03-27 DIAGNOSIS — J9601 Acute respiratory failure with hypoxia: Secondary | ICD-10-CM | POA: Diagnosis not present

## 2022-03-27 DIAGNOSIS — R451 Restlessness and agitation: Secondary | ICD-10-CM

## 2022-03-27 DIAGNOSIS — N186 End stage renal disease: Secondary | ICD-10-CM | POA: Diagnosis not present

## 2022-03-27 DIAGNOSIS — I82462 Acute embolism and thrombosis of left calf muscular vein: Secondary | ICD-10-CM | POA: Diagnosis not present

## 2022-03-27 DIAGNOSIS — I5021 Acute systolic (congestive) heart failure: Secondary | ICD-10-CM

## 2022-03-27 DIAGNOSIS — I2510 Atherosclerotic heart disease of native coronary artery without angina pectoris: Secondary | ICD-10-CM | POA: Diagnosis not present

## 2022-03-27 DIAGNOSIS — Z7189 Other specified counseling: Secondary | ICD-10-CM | POA: Diagnosis not present

## 2022-03-27 DIAGNOSIS — I5023 Acute on chronic systolic (congestive) heart failure: Secondary | ICD-10-CM | POA: Diagnosis not present

## 2022-03-27 DIAGNOSIS — Z515 Encounter for palliative care: Secondary | ICD-10-CM | POA: Diagnosis not present

## 2022-03-27 LAB — CBC WITH DIFFERENTIAL/PLATELET
Abs Immature Granulocytes: 0.1 10*3/uL — ABNORMAL HIGH (ref 0.00–0.07)
Basophils Absolute: 0.1 10*3/uL (ref 0.0–0.1)
Basophils Relative: 1 %
Eosinophils Absolute: 0.1 10*3/uL (ref 0.0–0.5)
Eosinophils Relative: 2 %
HCT: 30.8 % — ABNORMAL LOW (ref 39.0–52.0)
Hemoglobin: 9.5 g/dL — ABNORMAL LOW (ref 13.0–17.0)
Immature Granulocytes: 2 %
Lymphocytes Relative: 7 %
Lymphs Abs: 0.5 10*3/uL — ABNORMAL LOW (ref 0.7–4.0)
MCH: 33 pg (ref 26.0–34.0)
MCHC: 30.8 g/dL (ref 30.0–36.0)
MCV: 106.9 fL — ABNORMAL HIGH (ref 80.0–100.0)
Monocytes Absolute: 0.6 10*3/uL (ref 0.1–1.0)
Monocytes Relative: 9 %
Neutro Abs: 5.4 10*3/uL (ref 1.7–7.7)
Neutrophils Relative %: 79 %
Platelets: 278 10*3/uL (ref 150–400)
RBC: 2.88 MIL/uL — ABNORMAL LOW (ref 4.22–5.81)
RDW: 14.7 % (ref 11.5–15.5)
WBC: 6.8 10*3/uL (ref 4.0–10.5)
nRBC: 0.4 % — ABNORMAL HIGH (ref 0.0–0.2)

## 2022-03-27 LAB — BASIC METABOLIC PANEL
Anion gap: 11 (ref 5–15)
BUN: 12 mg/dL (ref 8–23)
CO2: 25 mmol/L (ref 22–32)
Calcium: 8 mg/dL — ABNORMAL LOW (ref 8.9–10.3)
Chloride: 100 mmol/L (ref 98–111)
Creatinine, Ser: 1.66 mg/dL — ABNORMAL HIGH (ref 0.61–1.24)
GFR, Estimated: 41 mL/min — ABNORMAL LOW (ref >60)
Glucose, Bld: 107 mg/dL — ABNORMAL HIGH (ref 70–99)
Potassium: 4.2 mmol/L (ref 3.5–5.1)
Sodium: 136 mmol/L (ref 135–145)

## 2022-03-27 LAB — RENAL FUNCTION PANEL
Albumin: 2.2 g/dL — ABNORMAL LOW (ref 3.5–5.0)
Albumin: 2.1 g/dL — ABNORMAL LOW (ref 3.5–5.0)
Anion gap: 9 (ref 5–15)
Anion gap: 9 (ref 5–15)
BUN: 12 mg/dL (ref 8–23)
BUN: 11 mg/dL (ref 8–23)
CO2: 25 mmol/L (ref 22–32)
CO2: 25 mmol/L (ref 22–32)
Calcium: 7.9 mg/dL — ABNORMAL LOW (ref 8.9–10.3)
Calcium: 7.8 mg/dL — ABNORMAL LOW (ref 8.9–10.3)
Chloride: 103 mmol/L (ref 98–111)
Chloride: 102 mmol/L (ref 98–111)
Creatinine, Ser: 1.65 mg/dL — ABNORMAL HIGH (ref 0.61–1.24)
Creatinine, Ser: 1.38 mg/dL — ABNORMAL HIGH (ref 0.61–1.24)
GFR, Estimated: 42 mL/min — ABNORMAL LOW (ref >60)
GFR, Estimated: 52 mL/min — ABNORMAL LOW (ref >60)
Glucose, Bld: 106 mg/dL — ABNORMAL HIGH (ref 70–99)
Glucose, Bld: 96 mg/dL (ref 70–99)
Phosphorus: 2 mg/dL — ABNORMAL LOW (ref 2.5–4.6)
Phosphorus: 2.3 mg/dL — ABNORMAL LOW (ref 2.5–4.6)
Potassium: 4.2 mmol/L (ref 3.5–5.1)
Potassium: 4.3 mmol/L (ref 3.5–5.1)
Sodium: 137 mmol/L (ref 135–145)
Sodium: 136 mmol/L (ref 135–145)

## 2022-03-27 LAB — C-REACTIVE PROTEIN: CRP: 9.2 mg/dL — ABNORMAL HIGH (ref <1.0)

## 2022-03-27 LAB — CULTURE, BLOOD (ROUTINE X 2)
Culture: NO GROWTH
Culture: NO GROWTH
Special Requests: ADEQUATE
Special Requests: ADEQUATE

## 2022-03-27 LAB — TYPE AND SCREEN
ABO/RH(D): A POS
Antibody Screen: NEGATIVE

## 2022-03-27 LAB — MAGNESIUM: Magnesium: 2.5 mg/dL — ABNORMAL HIGH (ref 1.7–2.4)

## 2022-03-27 LAB — APTT
aPTT: 40 seconds — ABNORMAL HIGH (ref 24–36)
aPTT: 48 seconds — ABNORMAL HIGH (ref 24–36)

## 2022-03-27 LAB — PROCALCITONIN: Procalcitonin: 0.12 ng/mL

## 2022-03-27 MED ORDER — QUETIAPINE FUMARATE 25 MG PO TABS: 25.0000 mg | ORAL_TABLET | Freq: Every day | ORAL | Status: DC

## 2022-03-27 MED ORDER — SODIUM PHOSPHATES 45 MMOLE/15ML IV SOLN
15.0000 mmol | Freq: Once | INTRAVENOUS | Status: AC
  Administered 2022-03-27: 15 mmol via INTRAVENOUS
  Filled 2022-03-27: qty 5

## 2022-03-27 MED ORDER — QUETIAPINE FUMARATE 25 MG PO TABS: 25.0000 mg | ORAL_TABLET | Freq: Two times a day (BID) | ORAL | Status: DC

## 2022-03-27 MED ORDER — HALOPERIDOL LACTATE 5 MG/ML IJ SOLN
3.0000 mg | Freq: Four times a day (QID) | INTRAMUSCULAR | Status: DC | PRN
  Administered 2022-03-29: 3 mg via INTRAVENOUS
  Filled 2022-03-27: qty 1

## 2022-03-27 MED ORDER — QUETIAPINE FUMARATE 50 MG PO TABS: 50.0000 mg | ORAL_TABLET | Freq: Two times a day (BID) | ORAL | Status: DC

## 2022-03-27 MED ORDER — CLONAZEPAM 0.5 MG PO TABS: 0.5000 mg | ORAL_TABLET | Freq: Two times a day (BID) | ORAL | Status: DC | PRN

## 2022-03-27 MED ORDER — DEXMEDETOMIDINE HCL IN NACL 400 MCG/100ML IV SOLN
0.0000 ug/kg/h | INTRAVENOUS | Status: DC
  Administered 2022-03-27: 0.4 ug/kg/h via INTRAVENOUS
  Filled 2022-03-27: qty 100

## 2022-03-27 MED ORDER — HALOPERIDOL LACTATE 5 MG/ML IJ SOLN
INTRAMUSCULAR | Status: AC
  Administered 2022-03-27: 2 mg via INTRAVENOUS
  Filled 2022-03-27: qty 1

## 2022-03-27 MED ORDER — MIDODRINE HCL 5 MG PO TABS
10.0000 mg | ORAL_TABLET | Freq: Three times a day (TID) | ORAL | Status: DC
  Administered 2022-03-29: 10 mg via ORAL
  Filled 2022-03-27: qty 2

## 2022-03-27 MED ORDER — QUETIAPINE FUMARATE 25 MG PO TABS
25.0000 mg | ORAL_TABLET | Freq: Two times a day (BID) | ORAL | Status: DC
  Administered 2022-03-28: 25 mg via ORAL
  Filled 2022-03-27 (×2): qty 1

## 2022-03-27 MED ORDER — HALOPERIDOL LACTATE 5 MG/ML IJ SOLN
2.0000 mg | Freq: Four times a day (QID) | INTRAMUSCULAR | Status: DC | PRN
  Administered 2022-03-27: 2 mg via INTRAVENOUS
  Filled 2022-03-27: qty 1

## 2022-03-27 NOTE — Progress Notes (Signed)
ANTICOAGULATION CONSULT NOTE - Follow Up Consult  Pharmacy Consult for apixaban > heparin Indication: DVT  Allergies  Allergen Reactions   Nsaids Other (See Comments)    Chronic kidney disease   Valacyclovir Nausea And Vomiting   Lisinopril Rash    Patient Measurements: Height: 6' (182.9 cm) Weight: 78.4 kg (172 lb 13.5 oz) IBW/kg (Calculated) : 77.6  Vital Signs: Temp: 97.4 F (36.3 C) (01/23 0353) Temp Source: Oral (01/23 0353) BP: 104/54 (01/23 0900) Pulse Rate: 47 (01/23 0900)  Labs: Recent Labs    03/25/22 0245 03/26/22 0453 03/26/22 1600 03/27/22 0444  HGB 9.6* 8.8*  --  9.5*  HCT 30.1* 28.1*  --  30.8*  PLT 258 247  --  278  APTT  --   --   --  40*  CREATININE 9.24* 3.58* 2.66* 1.65*  1.66*     Estimated Creatinine Clearance: 39 mL/min (A) (by C-G formula based on SCr of 1.66 mg/dL (H)).   Medical History: Past Medical History:  Diagnosis Date   Aortic insufficiency    Coronary artery disease    ESRD on peritoneal dialysis (HCC)    Hyperlipidemia    Hypertension     Medications:  Scheduled:   atorvastatin  20 mg Oral Daily   Chlorhexidine Gluconate Cloth  6 each Topical Daily   clopidogrel  75 mg Oral Daily   docusate sodium  100 mg Oral Daily   ezetimibe  10 mg Oral Daily   feeding supplement  237 mL Oral BID BM   multivitamin  1 tablet Oral QHS   pantoprazole  40 mg Oral Daily   polyethylene glycol  17 g Oral Daily   QUEtiapine  25 mg Oral BID    Assessment: William [name] s/p DES 1/10 started on dapt then noted to have occipital lobe stroke via head CT, then found to found to have DVT started on apixaban 1/20 - load avoided with recent stroke and DAPT. Asa dropped per cardiology after apixaban started.    Patient now on CRRT in ICU and will hold apixaban and start IV heparin drip.  aPTT is subtherapeutic at 40, on heparin infusion 500 units/hr. Hgb 9.5, plt 278. No infusion issues. Bleeding stopped overnight at HD IV site - no further issues.    Will titrate heparin by aptt with recent apixaban interfering with heparin level.   Goal of Therapy:  Aptt goal 60-William sec  Monitor platelets by anticoagulation protocol: Yes   Plan:  Increase heparin infusion at 650 units/hr   Order aPTT in 8 hr Monitor s/sx bleeding  Daily aptt and cbc   Antonietta Jewel, PharmD, [place] Pharmacist  Phone: [phone] 03/27/2022 9:23 AM  Please check AMION for all [place] phone numbers After 10:00 PM, call [place] [phone]

## 2022-03-27 NOTE — Progress Notes (Signed)
Chloride notified that pt's agitation/delirium is unchanged s/p PRN haldol. Awaiting further guidance/orders.

## 2022-03-27 NOTE — Progress Notes (Signed)
ANTICOAGULATION CONSULT NOTE - Follow Up Consult  Pharmacy Consult for apixaban > heparin Indication: DVT  Allergies  Allergen Reactions   Nsaids Other (See Comments)    Chronic kidney disease   Valacyclovir Nausea And Vomiting   Lisinopril Rash    Patient Measurements: Height: 6' (182.9 cm) Weight: 78.4 kg (172 lb 13.5 oz) IBW/kg (Calculated) : 77.6  Vital Signs: Temp: 93 F (33.9 C) (01/23 1758) Temp Source: Rectal (01/23 1758) BP: 98/82 (01/23 1800) Pulse Rate: 60 (01/23 1800)  Labs: Recent Labs    03/25/22 0245 03/26/22 0453 03/26/22 1600 03/27/22 0444 03/27/22 1631  HGB 9.6* 8.8*  --  9.5*  --   HCT 30.1* 28.1*  --  30.8*  --   PLT 258 247  --  278  --   APTT  --   --   --  40* 48*  CREATININE 9.24* 3.58* 2.66* 1.65*  1.66*  --      Estimated Creatinine Clearance: 39 mL/min (A) (by C-G formula based on SCr of 1.66 mg/dL (H)).   Medical History: Past Medical History:  Diagnosis Date   Aortic insufficiency    Coronary artery disease    ESRD on peritoneal dialysis (HCC)    Hyperlipidemia    Hypertension     Medications:  Scheduled:   atorvastatin  20 mg Oral Daily   Chlorhexidine Gluconate Cloth  6 each Topical Daily   clopidogrel  75 mg Oral Daily   docusate sodium  100 mg Oral Daily   ezetimibe  10 mg Oral Daily   feeding supplement  237 mL Oral BID BM   midodrine  10 mg Oral TID WC   multivitamin  1 tablet Oral QHS   pantoprazole  40 mg Oral Daily   polyethylene glycol  17 g Oral Daily   QUEtiapine  25 mg Oral BID    Assessment: 81 YOM s/p DES 1/10 started on dapt then noted to have occipital lobe stroke via head CT, then found to found to have DVT started on apixaban 1/20 - load avoided with recent stroke and DAPT. Asa dropped per cardiology after apixaban started.    Patient now on CRRT in ICU and will hold apixaban and start IV heparin drip. Will titrate heparin by aptt with recent apixaban interfering with heparin level.   aPTT  remains subtherapeutic (48 sec) on infusion at 650 units/hr. No issues with line or bleeding reported per RN.  Goal of Therapy:  Aptt goal 66-85 sec  Monitor platelets by anticoagulation protocol: Yes   Plan:  Increase heparin infusion to 800 units/hr   aPTT in 8 hours  Sherlon Handing, PharmD, BCPS Please see amion for complete clinical pharmacist phone list 03/27/2022 6:22 PM

## 2022-03-27 NOTE — Progress Notes (Signed)
Dr. Alcario Drought [facility]) text paged regarding pt's ongoing delirium (with hallucinations and fidgeting), requested precedex gtt, as pt has not slept in 3 days and I'm concerned he'll pull his [place] out. Awaiting further orders.

## 2022-03-27 NOTE — Progress Notes (Signed)
[facility] Progress Note   Assessment/ Plan:   1 Acute hypoxic RF: recurrent PNA + some component of volume overload.  Being treated with vanc/ cefepime--> Unasyn  modified PD script seemed to be somewhat effective first night it was done but now pt is more encephalopathic with lower EF- needs HD at least for short term- likely would not tolerate IHD with EF down, therefore CRRT started 1/21 with plan to do CRRT for about 48 hrs and then convert to HD, anticipating rinse back on 1/24 AM.  All 4K bags.  No heparin as is on Eliquis.  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 Metabolic Bone Disease:  renal vitamin with fluid restriction 6.  NSTEMI: s/p LHC 1/10, on ASA/ Plavix 7.  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 Per primary service  Subjective:    Patient seen and examined on CRRT. Daughter at bedside. Had issues with agitation overnight, required precedex however BP dropped. BP improving with d/c'ing precedex. Tolerating CRRT Net neg ~2.6L   Objective:   BP (!) 83/46   Pulse (!) 47   Temp (!) 97.4 F (36.3 C) (Oral)   Resp (!) 21   Ht 6' (1.829 m)   Wt 78.4 kg   SpO2 92%   BMI 23.44 kg/m   Physical Exam: GEN  NAD, laying flat in bed PULM CTA b/l CV RRR, no rubs heard ABD: soft, nontender EXT 1+ LE edema Neuro: asleep ACCESS: PD cath C/d/I, RIJ temp HD cath  Labs: BMET Recent Labs  Lab 03/22/22 1557 03/22/22 1605 03/23/22 0650 03/24/22 0554 03/25/22 0245 03/26/22 0453 03/26/22 1600 03/27/22 0444  NA 135 132* 136 138 138 139 136 137  136  K 4.7 4.7 4.9 4.4 4.0 4.1 4.8 4.2  4.2  CL 94*  --  95* 96* 96* 102 98 103  100  CO2 25  --  '22 22 22 26 25 25  25  '$ GLUCOSE 169*  --  109* 128* 164* 97 95 106*  107*  BUN 76*  --  84* 86* 87* 33* '22 12  12  '$ CREATININE 8.52*  --  9.44* 9.23* 9.24* 3.58* 2.66* 1.65*  1.66*  CALCIUM  7.3*  --  7.3* 7.7* 7.8* 7.5* 8.1* 7.9*  8.0*  PHOS  --   --   --   --   --  3.8 2.7 2.0*   CBC Recent Labs  Lab 03/24/22 0554 03/25/22 0245 03/26/22 0453 03/27/22 0444  WBC 7.3 8.1 6.2 6.8  NEUTROABS 5.9 6.7 4.9 5.4  HGB 9.3* 9.6* 8.8* 9.5*  HCT 29.2* 30.1* 28.1* 30.8*  MCV 105.4* 105.2* 107.3* 106.9*  PLT 223 258 247 278      Medications:     atorvastatin  20 mg Oral Daily   Chlorhexidine Gluconate Cloth  6 each Topical Daily   clopidogrel  75 mg Oral Daily   docusate sodium  100 mg Oral Daily   ezetimibe  10 mg Oral Daily   feeding supplement  237 mL Oral BID BM   multivitamin  1 tablet Oral QHS   pantoprazole  40 mg Oral Daily   polyethylene glycol  17 g Oral Daily   QUEtiapine  25 mg Oral BID    Gean Quint, MD [facility] 03/27/2022, 8:35 AM

## 2022-03-27 NOTE — Plan of Care (Signed)
Informed by primary service in regards to softer Bps and hypothermia. Labs are better, volume status is better. D/c CRRT, discussed with RN. Will assess for IHD moving forward, either tomorrow depending on clinical status vs Thurs. Please call on call nephrologist with any questions/concerns.  Gean Quint, MD [facility]

## 2022-03-27 NOTE — Progress Notes (Signed)
NAME:  William [name], MRN:  [id number], DOB:  January 05, 1941, LOS: 4 ADMISSION DATE:  03/22/2022, CONSULTATION DATE:  03/25/22 REFERRING MD:  Candiss Norse - TRH , CHIEF COMPLAINT:  need for CRRT   History of Present Illness:  82 yo M PMH ESRD on PD, CAD with recent NSTEMI (03/04/22) and recent DES to LAD (03/05/73), chronic systolic and diastolic HF who was admitted 1/19 for generalized weakness. Found to have decompensated HF, likely PNA. Started on abx, nephro and cards consulted. 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 On 1/20 a Lower extremity US revealed LLE DVT. DAPT was changed to plavix and eliquis. On 1/21 a decision was reached to attempt CRRT for a couple of days as a means of volume removal.   PCCM is consulted in this setting   Pertinent  Medical History  CAD NSTEMI ICM Chronic systolic and diastolic HF  CVA DVT ESRD   Significant Hospital Events: Including procedures, antibiotic start and stop dates in addition to other pertinent events   1/19 admit for wkness. Decomp HF. L PCA CVA. Neuro, cards, nephro consult  1/20 LLE DVT. Changed DAPT to eliquis plavix 1/21 decision for CRRT for volume removal. PCCM consulted  1/22 agitation delirium pulling at line 1/23 started on precedex overnight  Interim History / Subjective:  On precedex this am for agitation delirium overnight pulling at lines Some oozing overnight on HD cath site; thrombi pad placed  HR 40s and SBP 80s On precedex 1.2 Patient asleep CRRT running  Objective   Blood pressure (!) 83/46, pulse (!) 47, temperature (!) 97.4 F (36.3 C), temperature source Oral, resp. rate (!) 21, height 6' (1.829 m), weight 78.4 kg, SpO2 92 %. CVP:  [6 mmHg-8 mmHg] 8 mmHg      Intake/Output Summary (Last 24 hours) at 03/27/2022 1025 Last data filed at 03/27/2022 0715 Gross per 24 hour  Intake 614.2 ml  Output 3253 ml  Net -2638.8 ml    Filed Weights   03/24/22 0900 03/25/22 1500 03/27/22 0500  Weight: 82.1 kg 80.3 kg 78.4 kg     Examination: General:  elderly ill appearing male HEENT: MM pink/moist; CRRT in RIJ,  Neuro: Aox3; MAE; sedated; CV: s1s2, brady 40s, no m/r/g PULM:  dim clear BS bilaterally GI: soft, bsx4 active  Extremities: warm/dry, no edema  Skin: no rashes or lesions    Resolved Hospital Problem list     Assessment & Plan:  Acute metabolic encephalopathy -Due to hypoxia and uremia Agitation Delirium Plan: -wean precedex -seroquel added and prn klonopin  -delirium precautions -PT/OT -continue CRRT  Acute on chronic HFrEF congestive heart failure -Echocardiogram showed EF less than 20% Plan: -Unable to do GDMT due to low BP/HR -CRRT for volume removal -strict I/o's; daily weights  Acute hypoxic respiratory failure due to acute pulmonary edema -Probable aspiration pneumonia Plan: -on room air -continue CRRT for volume removal -continue unasyn for asp pneum ppx -resp culture pending  Acute LLE DVT Subacute left PCA territory stroke Plan: -continue heparin gtt -cont plavix -MRI brain when stable  ESRD on PD Anion gap metabolic acidosis -Patient is on peritoneal dialysis at home, probably he will end up needing intermittent hemodialysis Plan: -nephro following -continue CRRT -Trend BMP / urinary output -Replace electrolytes as indicated -Avoid nephrotoxic agents, ensure adequate renal perfusion  CAD, recent NSTEMI  s/p recent DES to LAD Plan: -plavix and statin   Best Practice (right click and "Reselect all SmartList Selections" daily)   Diet/type:  Regular DVT prophylaxis: systemic heparin GI prophylaxis: PPI Lines: HD catheter, yes still needed Foley:  N/A Code Status:  DNR Last date of multidisciplinary goals of care discussion [daughter updated 1/23 updated grandson at bedside]  Labs   CBC: Recent Labs  Lab 03/22/22 1557 03/22/22 1605 03/23/22 0650 03/24/22 0554 03/25/22 0245 03/26/22 0453 03/27/22 0444  WBC 12.4*  --  10.6* 7.3 8.1 6.2 6.8   NEUTROABS 10.8*  --   --  5.9 6.7 4.9 5.4  HGB 9.8*   < > 9.0* 9.3* 9.6* 8.8* 9.5*  HCT 30.7*   < > 28.1* 29.2* 30.1* 28.1* 30.8*  MCV 105.1*  --  104.5* 105.4* 105.2* 107.3* 106.9*  PLT 199  --  189 223 258 247 278   < > = values in this interval not displayed.     Basic Metabolic Panel: Recent Labs  Lab 03/23/22 0650 03/24/22 0554 03/25/22 0245 03/26/22 0453 03/26/22 1600 03/27/22 0444  NA 136 138 138 139 136 137  136  K 4.9 4.4 4.0 4.1 4.8 4.2  4.2  CL 95* 96* 96* 102 98 103  100  CO2 '22 22 22 26 25 25  25  '$ GLUCOSE 109* 128* 164* 97 95 106*  107*  BUN 84* 86* 87* 33* '22 12  12  '$ CREATININE 9.44* 9.23* 9.24* 3.58* 2.66* 1.65*  1.66*  CALCIUM 7.3* 7.7* 7.8* 7.5* 8.1* 7.9*  8.0*  MG 2.1 2.2 2.3 2.3  --  2.5*  PHOS  --   --   --  3.8 2.7 2.0*    GFR: Estimated Creatinine Clearance: 39 mL/min (A) (by C-G formula based on SCr of 1.66 mg/dL (H)). Recent Labs  Lab 03/22/22 1557 03/22/22 1756 03/23/22 0650 03/24/22 0554 03/25/22 0245 03/26/22 0453 03/27/22 0444  PROCALCITON  --   --    < > 0.70 0.60 0.13 0.12  WBC 12.4*  --    < > 7.3 8.1 6.2 6.8  LATICACIDVEN 2.1* 1.5  --   --   --   --   --    < > = values in this interval not displayed.     Liver Function Tests: Recent Labs  Lab 03/22/22 1557 03/26/22 0453 03/26/22 1600 03/27/22 0444  AST 34  --   --   --   ALT 42  --   --   --   ALKPHOS 70  --   --   --   BILITOT 0.7  --   --   --   PROT 6.3*  --   --   --   ALBUMIN 2.6* 2.2* 2.6* 2.2*    No results for input(s): "LIPASE", "AMYLASE" in the last 168 hours. Recent Labs  Lab 03/22/22 1657  AMMONIA 12     ABG    Component Value Date/Time   PHART 7.396 03/04/2022 1116   PCO2ART 29.2 (L) 03/04/2022 1116   PO2ART 79 (L) 03/04/2022 1116   HCO3 26.6 03/22/2022 1605   TCO2 28 03/22/2022 1605   ACIDBASEDEF 6.0 (H) 03/04/2022 1116   O2SAT 22 03/22/2022 1605     Coagulation Profile: Recent Labs  Lab 03/22/22 1557  INR 1.3*      Cardiac Enzymes: No results for input(s): "CKTOTAL", "CKMB", "CKMBINDEX", "TROPONINI" in the last 168 hours.  HbA1C: Hgb A1c MFr Bld  Date/Time Value Ref Range Status  03/14/2022 01:34 AM 5.5 4.8 - 5.6 % Final    Comment:    (NOTE) Pre diabetes:  5.7%-6.4%  Diabetes:              >6.4%  Glycemic control for   <7.0% adults with diabetes     CBG: Recent Labs  Lab 03/22/22 1556 03/23/22 1322  GLUCAP 150* 106*     Review of Systems:   Unable to obtain due to encephalopathy   Past Medical History:  He,  has a past medical history of Aortic insufficiency, Coronary artery disease, ESRD on peritoneal dialysis ([place]), Hyperlipidemia, and Hypertension.   Surgical History:   Past Surgical History:  Procedure Laterality Date   APPENDECTOMY     CARDIOVASCULAR STRESS TEST  06/08/2009   EF 43%   CHOLECYSTECTOMY     CORONARY STENT INTERVENTION N/A 03/14/2022   Procedure: CORONARY STENT INTERVENTION;  Surgeon: Troy Sine, MD;  Location: [facility];  Service: Cardiovascular;  Laterality: N/A;   FOOT SURGERY     HERNIA REPAIR     LEFT HEART CATH AND CORONARY ANGIOGRAPHY N/A 03/12/2022   Procedure: LEFT HEART CATH AND CORONARY ANGIOGRAPHY;  Surgeon: Nelva Bush, MD;  Location: [facility];  Service: Cardiovascular;  Laterality: N/A;   US ECHOCARDIOGRAPHY  05/24/2009   EF 60%     Social History:   reports that he quit smoking about 56 years ago. His smoking use included cigarettes. He has never used smokeless tobacco. He reports that he does not drink alcohol and does not use drugs.   Family History:  His family history includes Cancer in his mother; Cirrhosis in his father; Diabetes in his brother.   Allergies Allergies  Allergen Reactions   Nsaids Other (See Comments)    Chronic kidney disease   Valacyclovir Nausea And Vomiting   Lisinopril Rash     Home Medications  Prior to Admission medications   Medication Sig Start Date End Date  Taking? Authorizing Provider  acetaminophen (TYLENOL) 325 MG tablet Take 2 tablets (650 mg total) by mouth every 6 (six) hours as needed for headache or mild pain. 03/15/22  Yes Mercy Riding, MD  aspirin 81 MG chewable tablet Chew 1 tablet (81 mg total) by mouth daily. 03/16/22 03/16/23 Yes Mercy Riding, MD  atorvastatin (LIPITOR) 20 MG tablet Take 1 tablet (20 mg total) by mouth daily. 03/16/22 06/14/22 Yes Mercy Riding, MD  clopidogrel (PLAVIX) 75 MG tablet Take 1 tablet (75 mg total) by mouth daily. 03/16/22 03/16/23 Yes Mercy Riding, MD  ezetimibe (ZETIA) 10 MG tablet Take 1 tablet (10 mg total) by mouth daily. 03/16/22 06/14/22 Yes Mercy Riding, MD  melatonin 3 MG TABS tablet Take 1 tablet (3 mg total) by mouth at bedtime. 03/15/22  Yes Mercy Riding, MD  metoprolol tartrate (LOPRESSOR) 25 MG tablet Take 1 tablet (25 mg total) by mouth 2 (two) times daily. 03/15/22 09/11/22 Yes Mercy Riding, MD  ondansetron (ZOFRAN) 4 MG tablet Take 1 tablet (4 mg total) by mouth daily as needed for up to 365 doses for nausea or vomiting. 03/15/22  Yes Mercy Riding, MD  pantoprazole (PROTONIX) 40 MG tablet Take 1 tablet (40 mg total) by mouth daily. 03/16/22 04/15/22 Yes Mercy Riding, MD  sevelamer carbonate (RENVELA) 800 MG tablet Take 1 tablet (800 mg total) by mouth 3 (three) times daily with meals. 03/15/22 06/13/22 Yes Mercy Riding, MD      Critical Care Time: 35 minutes  JD Ger[facility] 03/27/2022, 7:43 AM  Please see  [email or web address] for pager details.  From 7A-7P if no response, please call [phone]. After hours, please call ELink [phone].

## 2022-03-27 NOTE — Progress Notes (Signed)
Palliative:  HPI: 82 y.o. male  with past medical history of chronic HFrEF, ESRD on PD, hypertension, hyperlipidemia, and BPH admitted on 03/22/2022 with weakness, confusion, and poor p.o. intake. Recently admitted 12/29-1/11 for acute hypoxic respiratory failure secondary to multifocal pneumonia and was intubated 12/29-1/1.  Hospital course complicated by NSTEMI requiring DES to mid LAD on 1/10 and delirium which required Precedex drip.  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  PMT consulted to discuss goals of care.     I met today at William [name]' bedside with daughter, William [name]. I spoke with her and then called and spoke with daughter/HCPOA, William [name]. I discussed with them William [name]' current status and overall prognosis. They both express understanding of William [name] complicated condition and they concern for quality of life and suffering. William [name] expresses concern that they did feel that they wished for him to be readmitted to the hospital this time with concern for his quality of life and that this would not be his desire. Seems that much of the family feel he is suffering and that this is not what he would desire. His wife is not yet to this point. I discussed with William [name] that we will speak with wife, William [name], and continue our conversations to ensure that the interventions that we are providing are what William [name] would want for himself. Living Will reviewed and is very clear regarding his wish that he would not desire interventions to prolong his suffering.   I spoke with wife, William [name]. We discussed overall health status, suffering, quality of life, goals of care with her husband's wishes. William [name] reports that although she is hopeful that he can improve she understands that overall outcome to improve to a quality of life he would desire is unlikely. She is interested to see how he does with intermittent  dialysis although she does share that he has told her he would never desire hemodialysis. She  shares the struggles as he has been very healthy and functional up until ~1 month ago. She requests time for outcomes but open to further discussion and consideration of ensuring that we are making the right decisions for him moving forward.   All questions/concerns addressed. Emotional support provided.   Exam: Sleeping. Confused when awake. Bradycardic. No distress. Breathing regular, unlabored. Abd flat. CRRT going and tolerating.   Plan: - Ongoing goals of care conversations.   [place], NP Palliative Medicine Team Pager [phone] (Please see [email or web address] for schedule) Team Phone [phone]    Greater than 50%  of this time was spent counseling and coordinating care related to the above assessment and plan

## 2022-03-27 NOTE — Progress Notes (Signed)
Physical Therapy Treatment Patient Details Name: Joram Venson MRN: [id number] DOB: [birth year] Today's Date: 03/27/2022   History of Present Illness Vyncent Overby is a 82 y.o. male presented to ED with AMS found to have PNA with HF exacerbation and L PCA CVA. 1/20 LLR DVT, 1/21 CRRT. PMHx: chronic HFrEF, ESRD on PD, hypertension, hyperlipidemia, BPH recently admitted 12/29-1/11 for acute hypoxic respiratory failure secondary to multifocal pneumonia and was intubated 12/29-1/1    PT Comments    Pt mildly drowsy in setting of medication, however, demonstrates good activity tolerance throughout. Focus on transfer and pre gait training. Pt overall requiring two person mod-max assist for functional mobility. Session limited due to difficulty with IJ CRRT access with change in positioning. Suspect steady progress. Continue to recommend acute inpatient rehab (AIR) for post-acute therapy needs.    Recommendations for follow up therapy are one component of a multi-disciplinary discharge planning process, led by the attending physician.  Recommendations may be updated based on patient status, additional functional criteria and insurance authorization.  Follow Up Recommendations  Acute inpatient rehab (3hours/day)     Assistance Recommended at Discharge Frequent or constant Supervision/Assistance  Patient can return home with the following A little help with bathing/dressing/bathroom;Assistance with cooking/housework;Assist for transportation;Help with stairs or ramp for entrance;Direct supervision/assist for medications management;Direct supervision/assist for financial management;A lot of help with walking and/or transfers   Equipment Recommendations  Other (comment) (TBA)    Recommendations for Other Services       Precautions / Restrictions Precautions Precautions: Fall Precaution Comments: IJ CRRT Restrictions Weight Bearing Restrictions: No     Mobility  Bed Mobility Overal bed  mobility: Needs Assistance Bed Mobility: Supine to Sit, Sit to Supine     Supine to sit: +2 for physical assistance, +2 for safety/equipment, Mod assist Sit to supine: Max assist, +2 for physical assistance, +2 for safety/equipment   General bed mobility comments: pt initiating task well to sit up on side of bed, assist to guide BLE's off edge of bed and for trunk to upright. Increased assist to return to bed due to protecting IJ line    Transfers Overall transfer level: Needs assistance Equipment used: Rolling walker (2 wheels) Transfers: Sit to/from Stand Sit to Stand: Mod assist, +2 physical assistance, +2 safety/equipment           General transfer comment: Assist to power up and balance from edge of bed; worked on weight shifting to R/L, marching in place, and taking ~2 steps forward and backwards. Verbal cues for bilateral quad activation. increased left lateral lean    Ambulation/Gait                   Stairs             Wheelchair Mobility    Modified Rankin (Stroke Patients Only) Modified Rankin (Stroke Patients Only) Pre-Morbid Rankin Score: No symptoms Modified Rankin: Severe disability     Balance Overall balance assessment: Needs assistance Sitting-balance support: Feet supported Sitting balance-Leahy Scale: Fair     Standing balance support: Bilateral upper extremity supported, During functional activity Standing balance-Leahy Scale: Poor                              Cognition Arousal/Alertness: Awake/alert Behavior During Therapy: Flat affect Overall Cognitive Status: Impaired/Different from baseline Area of Impairment: Orientation, Attention, Memory, Following commands, Safety/judgement, Awareness, Problem solving  Orientation Level: Disoriented to, Place, Situation, Person Current Attention Level: Sustained Memory: Decreased short-term memory, Decreased recall of precautions Following Commands:  Follows one step commands consistently Safety/Judgement: Decreased awareness of safety, Decreased awareness of deficits Awareness: Emergent Problem Solving: Slow processing, Decreased initiation, Difficulty sequencing, Requires verbal cues General Comments: Pt more drowsy this session (likely due to Precedex); following 1 step commands with increased time and intermittent multimodal cueing        Exercises      General Comments        Pertinent Vitals/Pain Pain Assessment Pain Assessment: No/denies pain    Home Living                          Prior Function            PT Goals (current goals can now be found in the care plan section) Acute Rehab PT Goals PT Goal Formulation: With patient/family Time For Goal Achievement: 04/06/22 Potential to Achieve Goals: Good Progress towards PT goals: Progressing toward goals    Frequency    Min 4X/week      PT Plan Current plan remains appropriate    Co-evaluation              AM-PAC PT "6 Clicks" Mobility   Outcome Measure  Help needed turning from your back to your side while in a flat bed without using bedrails?: A Lot Help needed moving from lying on your back to sitting on the side of a flat bed without using bedrails?: A Lot Help needed moving to and from a bed to a chair (including a wheelchair)?: Total Help needed standing up from a chair using your arms (e.g., wheelchair or bedside chair)?: A Lot Help needed to walk in hospital room?: Total Help needed climbing 3-5 steps with a railing? : Total 6 Click Score: 9    End of Session Equipment Utilized During Treatment: Gait belt Activity Tolerance: Patient tolerated treatment well Patient left: in bed;with call bell/phone within reach;with bed alarm set;with family/visitor present Nurse Communication: Mobility status PT Visit Diagnosis: Unsteadiness on feet (R26.81);Muscle weakness (generalized) (M62.81);Difficulty in walking, not elsewhere  classified (R26.2);Other symptoms and signs involving the nervous system (R29.898)     Time: 5102-5852 PT Time Calculation (min) (ACUTE ONLY): 19 min  Charges:  $Therapeutic Activity: 8-22 mins                     Wyona Almas, PT, DPT Acute Rehabilitation Services Office [phone]    Deno Etienne 03/27/2022, 1:18 PM

## 2022-03-27 NOTE — Progress Notes (Signed)
eLink Physician-Brief Progress Note Patient Name: William [name] DOB: 1940-06-18 MRN: [id number]   Date of Service  03/27/2022  HPI/Events of Note  Agitation overnight with significant concerns regarding pulling lines including HD cath in place  eICU Interventions  Precedex started Will contact PCCM team in am regarding re-consult Will need long term management for delirium/hallucinations     Intervention Category Minor Interventions: Agitation / anxiety - evaluation and management  Keighan Amezcua Rodman Pickle 03/27/2022, 5:19 AM

## 2022-03-27 NOTE — Progress Notes (Signed)
Inpatient Rehabilitation Admissions Coordinator   I am following his progress from a distance to assist with planning dispo when appropriate.  Danne Baxter, RN, MSN Rehab Admissions Coordinator [phone] 03/27/2022 12:07 PM

## 2022-03-27 NOTE — Progress Notes (Addendum)
Subjective:  Overnight:NAEON  Sleeping this a.m.  Per nurse report patient was very agitated overnight requiring Haldol injection and IV Precedex.  Nurse reported patient was awake overnight and slept at 7 am.   Objective:  Vital signs in last 24 hours: Vitals:   03/27/22 0600 03/27/22 0635 03/27/22 0700 03/27/22 0714  BP: 122/63  (!) 71/45 (!) 83/46  Pulse: 62 (!) 54 (!) 47 (!) 47  Resp: 18 (!) 21 (!) 22 (!) 21  Temp:      TempSrc:      SpO2: 98% 92% 95% 92%  Weight:      Height:       Supplemental O2: Nasal Cannula Last BM Date : 03/20/22 SpO2: 92 % O2 Flow Rate (L/min): 2 L/min Filed Weights   03/24/22 0900 03/25/22 1500 03/27/22 0500  Weight: 82.1 kg 80.3 kg 78.4 kg    Intake/Output Summary (Last 24 hours) at 03/27/2022 0743 Last data filed at 03/27/2022 0715 Gross per 24 hour  Intake 614.2 ml  Output 3253 ml  Net -2638.8 ml    Net IO Since Admission: -3,802.58 mL [03/27/22 0743]  Physical Exam  General: ill appearing elderly male HENT: NCAT Lungs: CTAB anteriorly Cardiovascular: NSR, good pulses in extremities, no LE edema Abdomen: No TTP, normal bowel sounds MSK: No asymmetry Neuro: sleeping unable to assess.  Psych: Normal mood and normal affect  Diagnostics    Latest Ref Rng & Units 03/27/2022    4:44 AM 03/26/2022    4:53 AM 03/25/2022    2:45 AM  CBC  WBC 4.0 - 10.5 K/uL 6.8  6.2  8.1   Hemoglobin 13.0 - 17.0 g/dL 9.5  8.8  9.6   Hematocrit 39.0 - 52.0 % 30.8  28.1  30.1   Platelets 150 - 400 K/uL 278  247  258        Latest Ref Rng & Units 03/27/2022    4:44 AM 03/26/2022    4:00 PM 03/26/2022    4:53 AM  CMP  Glucose 70 - 99 mg/dL 70 - 99 mg/dL 107    106  95  97   BUN 8 - 23 mg/dL 8 - 23 mg/dL '12    12  22  '$ 33   Creatinine 0.61 - 1.24 mg/dL 0.61 - 1.24 mg/dL 1.66    1.65  2.66  3.58   Sodium 135 - 145 mmol/L 135 - 145 mmol/L 136    137  136  139   Potassium 3.5 - 5.1 mmol/L 3.5 - 5.1 mmol/L 4.2    4.2  4.8  4.1   Chloride 98 -  111 mmol/L 98 - 111 mmol/L 100    103  98  102   CO2 22 - 32 mmol/L 22 - 32 mmol/L '25    25  25  26   '$ Calcium 8.9 - 10.3 mg/dL 8.9 - 10.3 mg/dL 8.0    7.9  8.1  7.5     Procalcitonin 0.12 CRP 9.2 Magnesium 2.5   MR BRAIN WO CONTRAST  Result Date: 03/23/2022 IMPRESSION: Only diffusion images were acquired due to the patient's inability to tolerate the exam. 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 No other areas of restricted diffusion. Electronically Signed   By: Emmit Alexanders M.D.   On: 03/23/2022 14:04   CT HEAD WO CONTRAST (5MM)  Result Date: 03/23/2022 IMPRESSION: Suspected subacute infarct in the left occipital lobe with possible petechial hemorrhage but no mass effect. Recommend brain MRI  for better evaluation. These results will be called to the ordering clinician or representative by the Radiologist Assistant, and communication documented in the PACS or [employer]. Electronically Signed   By: Valetta Mole M.D.   On: 03/23/2022 09:38   DG Chest Port 1 View  Result Date: 03/23/2022 CLINICAL DATA:  Shortness of breath.  Concern for pneumonia. EXAM: PORTABLE CHEST 1 VIEW COMPARISON:  03/22/2022 FINDINGS: Unchanged cardiac enlargement. Decreased left pleural effusion. Diffuse bilateral interstitial and airspace opacities are again noted. Compared with the previous exam there is been improved aeration to both lower lung zones. IMPRESSION: 1. Persistent bilateral interstitial and airspace opacities with improved aeration to both lower lung zones. 2. Decrease in left pleural effusion. Electronically Signed   By: Kerby Moors M.D.   On: 03/23/2022 06:29   Assessment/Plan: Kaisyn Millea is a 82 y.o. male with medical history significant of chronic HFrEF, ESRD on PD, hypertension, hyperlipidemia, BPH recently admitted 12/29-1/11 for acute hypoxic respiratory failure secondary to multifocal pneumonia and was intubated 12/29-1/1 presented to ED with AMS found to have PNA with HF  exacerbation and acute stroke.    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 Pt satting well on RA. Secondary to combined pneumonia and HF exacerbation. Procal elevated. Pt on Unasyn day 5. Plan to continue abx. Pt initiated on CRRT for fluid removal 01/21 and 4.8 L removed since initiation with net negative 3.6 L.  Metabolic encephalopathy Multifactorial. Given overnight events appears pt has development of hospital delirium. Due to combination of acute hypoxic respiratory failure, CHF and pneumonia. Was worsened by uremic encephalopathy (see plan below) but that showed improvement yesterday.  Pt was given haldol and started on IV precedex overnight. Pt previously has had issues with delirium during his last hospital stay as well requiring Precedex drip, minimize benzodiazepines narcotics, as needed IM Haldol, nighttime Seroquel and monitor. Delirium precautions ordered.  -Will schedule Seroquel 25 mg BID.  -CTM mental status  ESRD on home PD.  Volume status improved.  Nephrology following and patient on midodrine 5 mg TID. Pt initiated on CRRT for fluid removal 01/21 and 4.8 L removed since initiation with net negative 3.6 L. BUN decreased 12 this am. Plan is to switch CRRT to HD tomorrow with first session on 1/25.   CAD with chronic systolic heart failure  due to ischemic cardiomyopathy EF 25% on recent echocardiogram.  Recent history of left heart cath showing multivessel CAD requiring DES to mid LAD on 03/14/2022.   Appears to be in CHF exacerbation again. Recent EF low at 25-30 %. Some intrapulmonary shunting noted. More euvolemic today compared to yesterday. Pt on Eliquis and Plavix. Cardiology following.    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 MRI shows acute stroke in the left PCA territory. Unable to obtain MRA  of the neck yesterday due to encephalopathy. Pt was on DAPT due to recent stent placement. Recent echo shows slight decease in EF at 45 % with G1DD. A1c normal on 03/14/22. Neurology following and plan to attempt further imaging if mental status allows. MRA head pending.  Carotid ultrasound without any stenosis. Echo with bubble study without any shunt but worsened EF from 45 % to 25 % and LV with global hypokinesis. Acute DVT of LLE seen so pt regimen changed on 03/24/21 to plavix and eliquis with discontinuation of aspirin.   Dyslipidemia.  Chronic. LDL 32. Continue statin and Zetia.   Hypertension.  Chronic. Currently on beta-blocker.   Chronic macrocytic anemia.  B12, TSH stable.  PCP to monitor outpatient.   GERD - PPI   Diet: Normal IVF: None,None VTE: Heparin Code: DNR PT/OT recs: Home Health,  Prior to Admission Living Arrangement: Home Anticipated Discharge Location: Home Barriers to Discharge: Medical workup  Dispo: Anticipated discharge in approximately 1-2 day(s).   William Schuller, MD Tillie Rung. [facility], PGY-2  Pager: [phone] After 5 pm and on weekends: Please call the on-call pager     Attestation -   I have directly reviewed the clinical findings, lab results and imaging studies. I have interviewed and examined the patient and agree with the documentation and management as recorded by the Dr Humphrey Rolls, patient admitted for acute stroke,  fluid overload, recent MI requiring DES stent to mid LAD. This admission found to be in CHF, new DVT and new stroke, now developing worsening uremia. Was on home PD but started developing signs of uremia and was started on CRRT on 03/25/2022 with gradual but definite improvement.  Continue CRRT.  He had some bleeding from his right IJ CRRT access site which has resolved after thrombin was applied topically, he is being followed by neurology, cardiology and nephrology closely.  Did develop mild toxic and metabolic encephalopathy for which as needed Haldol in daytime and nighttime Seroquel have been started on 03/27/2022, avoiding Precedex as he developed significant bradycardia with it, continue to monitor closely.  Overall clinically improving from uremia standpoint mild hospital-acquired delirium remains an active issue.  BP slightly soft on CRRT, added Midodrine, hold CRRT tonight and let him equilibrate, DW Renal, family leaning towards comfort if any further decline.

## 2022-03-27 NOTE — Progress Notes (Signed)
SLP Cancellation Note  Patient Details Name: William [name] MRN: [id number] DOB: Sep 15, 1940   Cancelled treatment:       Reason Eval/Treat Not Completed: Medical issues which prohibited therapy. Discussed with RN who says that pt is to stay on CRRT today, but may come off tomorrow morning. He adds that pt had no overt difficulty with swallowing on previous date. Will continue current plan for now and f/u for completion of MBS as able.     Osie Bond., M.A. [place] Office [phone]  Secure chat preferred  03/27/2022, 8:20 AM

## 2022-03-28 ENCOUNTER — Inpatient Hospital Stay (HOSPITAL_COMMUNITY): Payer: PPO

## 2022-03-28 DIAGNOSIS — E43 Unspecified severe protein-calorie malnutrition: Secondary | ICD-10-CM | POA: Insufficient documentation

## 2022-03-28 DIAGNOSIS — I5021 Acute systolic (congestive) heart failure: Secondary | ICD-10-CM

## 2022-03-28 DIAGNOSIS — Z7189 Other specified counseling: Secondary | ICD-10-CM | POA: Diagnosis not present

## 2022-03-28 DIAGNOSIS — Z515 Encounter for palliative care: Secondary | ICD-10-CM | POA: Diagnosis not present

## 2022-03-28 DIAGNOSIS — J9601 Acute respiratory failure with hypoxia: Secondary | ICD-10-CM | POA: Diagnosis not present

## 2022-03-28 DIAGNOSIS — N186 End stage renal disease: Secondary | ICD-10-CM | POA: Diagnosis not present

## 2022-03-28 LAB — CBC WITH DIFFERENTIAL/PLATELET
Abs Immature Granulocytes: 0.16 10*3/uL — ABNORMAL HIGH (ref 0.00–0.07)
Basophils Absolute: 0.1 10*3/uL (ref 0.0–0.1)
Basophils Relative: 1 %
Eosinophils Absolute: 0.1 10*3/uL (ref 0.0–0.5)
Eosinophils Relative: 2 %
HCT: 29.1 % — ABNORMAL LOW (ref 39.0–52.0)
Hemoglobin: 9.3 g/dL — ABNORMAL LOW (ref 13.0–17.0)
Immature Granulocytes: 2 %
Lymphocytes Relative: 6 %
Lymphs Abs: 0.5 10*3/uL — ABNORMAL LOW (ref 0.7–4.0)
MCH: 33.5 pg (ref 26.0–34.0)
MCHC: 32 g/dL (ref 30.0–36.0)
MCV: 104.7 fL — ABNORMAL HIGH (ref 80.0–100.0)
Monocytes Absolute: 0.7 10*3/uL (ref 0.1–1.0)
Monocytes Relative: 9 %
Neutro Abs: 6.5 10*3/uL (ref 1.7–7.7)
Neutrophils Relative %: 80 %
Platelets: 263 10*3/uL (ref 150–400)
RBC: 2.78 MIL/uL — ABNORMAL LOW (ref 4.22–5.81)
RDW: 15 % (ref 11.5–15.5)
WBC: 8.1 10*3/uL (ref 4.0–10.5)
nRBC: 1.6 % — ABNORMAL HIGH (ref 0.0–0.2)

## 2022-03-28 LAB — MAGNESIUM: Magnesium: 2.6 mg/dL — ABNORMAL HIGH (ref 1.7–2.4)

## 2022-03-28 LAB — RENAL FUNCTION PANEL
Albumin: 2.1 g/dL — ABNORMAL LOW (ref 3.5–5.0)
Albumin: 2.1 g/dL — ABNORMAL LOW (ref 3.5–5.0)
Anion gap: 9 (ref 5–15)
Anion gap: 12 (ref 5–15)
BUN: 18 mg/dL (ref 8–23)
BUN: 30 mg/dL — ABNORMAL HIGH (ref 8–23)
CO2: 24 mmol/L (ref 22–32)
CO2: 23 mmol/L (ref 22–32)
Calcium: 7.8 mg/dL — ABNORMAL LOW (ref 8.9–10.3)
Calcium: 7.8 mg/dL — ABNORMAL LOW (ref 8.9–10.3)
Chloride: 104 mmol/L (ref 98–111)
Chloride: 103 mmol/L (ref 98–111)
Creatinine, Ser: 2.6 mg/dL — ABNORMAL HIGH (ref 0.61–1.24)
Creatinine, Ser: 3.83 mg/dL — ABNORMAL HIGH (ref 0.61–1.24)
GFR, Estimated: 24 mL/min — ABNORMAL LOW (ref >60)
GFR, Estimated: 15 mL/min — ABNORMAL LOW (ref >60)
Glucose, Bld: 85 mg/dL (ref 70–99)
Glucose, Bld: 99 mg/dL (ref 70–99)
Phosphorus: 3.2 mg/dL (ref 2.5–4.6)
Phosphorus: 4.2 mg/dL (ref 2.5–4.6)
Potassium: 4.4 mmol/L (ref 3.5–5.1)
Potassium: 4.2 mmol/L (ref 3.5–5.1)
Sodium: 137 mmol/L (ref 135–145)
Sodium: 138 mmol/L (ref 135–145)

## 2022-03-28 LAB — IRON AND TIBC
Iron: 34 ug/dL — ABNORMAL LOW (ref 45–182)
Saturation Ratios: 17 % — ABNORMAL LOW (ref 17.9–39.5)
TIBC: 202 ug/dL — ABNORMAL LOW (ref 250–450)
UIBC: 168 ug/dL

## 2022-03-28 LAB — HEPARIN LEVEL (UNFRACTIONATED): Heparin Unfractionated: >1.1 IU/mL — ABNORMAL HIGH (ref 0.30–0.70)

## 2022-03-28 LAB — APTT
aPTT: 43 seconds — ABNORMAL HIGH (ref 24–36)
aPTT: 60 seconds — ABNORMAL HIGH (ref 24–36)

## 2022-03-28 LAB — FERRITIN: Ferritin: 1120 ng/mL — ABNORMAL HIGH (ref 24–336)

## 2022-03-28 LAB — HEPATITIS B SURFACE ANTIGEN: Hepatitis B Surface Ag: NONREACTIVE

## 2022-03-28 LAB — PROCALCITONIN: Procalcitonin: 0.33 ng/mL

## 2022-03-28 MED ORDER — ENSURE ENLIVE PO LIQD: 237.0000 mL | Freq: Three times a day (TID) | ORAL | Status: DC

## 2022-03-28 MED ORDER — CHLORHEXIDINE GLUCONATE CLOTH 2 % EX PADS
6.0000 | MEDICATED_PAD | Freq: Every day | CUTANEOUS | Status: DC
  Administered 2022-03-28 – 2022-03-29 (×2): 6 via TOPICAL

## 2022-03-28 MED ORDER — DARBEPOETIN ALFA 60 MCG/0.3ML IJ SOSY
60.0000 ug | PREFILLED_SYRINGE | INTRAMUSCULAR | Status: DC
  Filled 2022-03-28: qty 0.3

## 2022-03-28 NOTE — Progress Notes (Signed)
Nutrition Follow-up  DOCUMENTATION CODES:   Severe malnutrition in context of acute illness/injury (acute on chronic malnutrition likely)  INTERVENTION:   Plan to hold on Cortrak placement today per discussion with patient's family, Dr. Florene Glen (today's attending) and Dr. Carlis Abbott (ordering provider of Cortrak). Also discussed plan with Palliative Care, plan for family meeting tomorrow around 12pm after iHD. Plan to reassess Cortrak need then with Cortrak placement on Friday pending further [place]  Feeding assistance at meals, encourage po intake as able  Ensure Enlive po TID, each supplement provides 350 kcal and 20 grams of protein.   NUTRITION DIAGNOSIS:   Severe Malnutrition related to acute illness (likely acute on chronic) as evidenced by energy intake < or equal to 50% for > or equal to 5 days, moderate muscle depletion, mild fat depletion.  Continues  GOAL:   Patient will meet greater than or equal to 90% of their needs  Not Met  MONITOR:   PO intake, Supplement acceptance, Labs, Weight trends  REASON FOR ASSESSMENT:   Malnutrition Screening Tool    ASSESSMENT:   82 y.o. male presented to the [facility] with generalized weakness, confusion, and poor PO intake. Pt recently admitted 12/29-1/11 for acute hypoxic respiratory failure 2/2 pneumonia and NSTEMI. PMH includes ESRD on PD, HTN, HLD, GERD, CHF, and CAD. Pt admitted with acute hypoxemic respiratory failure, volume overload, possible PNA, and metabolic encephalopathy.  1/19 Admitted for weakness 1/21 CRRT initiated 1/23 CRRT discontinued  Pt has been experiencing delirium/agitation, currently off precedex. Noted plan for MRA Head tomorrow  MBS today, diet downgraded to Dysphagia 2, Nectar Thick from Regular. Per family and per RN however, pt is not eating or drinking anything. Family is very concerned about his nutritional status.  Family reports po intake has been poor for around 1 month since previous admission. Family  reports that prior to admission 12/29, pt eating ok, appetite "normal for patient" with no weight loss. During last admission and prior to this admission, pt eating much less with pt eating nothing for 2 days prior to admit on 1/19. Currently po intake has been 0-20% of meals. Minimal nutrition x 7 days. Between hospitalizations, family report he would eat a small bowl of cherrios, half a sandwich and cup of pudding for the entire day  Noted order for Cortark placement today. Discussed this with patient's family , Dr. Florene Glen and Dr. Carlis Abbott. Family struggling with Cortrak placement given pt is not eating and needs nutrition but family also notes that if pt "does not tolerate iHD tomorrow then there is nothing that can be home and family wishes to take pt home." Discussed placement of Cortrak today vs reassessing Cortrak placement post iHD and family meeting tomorrow." Family only wanting Cortrak today if provision of TF would improve chances of tolerating iHD tomorrow. RD discussed with MD, MD indicating this Cortrak placement today with initiation of TF for <24 hours would not have any affect on HD tolerance tomorrow. If however, pt does well with iHD tomorrow and family wishes to continue with current poc, then all agree that Cortrak placement with initiation of TF would be of benefit.  Based on weight encounters, pt has experienced weight loss. Current weight 73.2 kg, admit weight around 83.9 kg. Noted weight of 76.2 kg on 1/09, otherwise no recent wt encounters. Unsure of patient's recent baseline dry weight; family believes pt has lost weight over the past month.   Labs: reviewed Meds: reviewed  NUTRITION - FOCUSED PHYSICAL EXAM:  [place]  Most Recent Value  Orbital Region Mild depletion  Upper Arm Region Unable to assess  Thoracic and Lumbar Region Mild depletion  Buccal Region Mild depletion  Temple Region Moderate depletion  Clavicle Bone Region Moderate depletion  Clavicle and Acromion  Bone Region Moderate depletion  Scapular Bone Region Moderate depletion  Dorsal Hand Unable to assess  Patellar Region Unable to assess  Anterior Thigh Region Unable to assess  Posterior Calf Region Unable to assess  Edema (RD Assessment) Mild       Diet Order:   Diet Order             DIET DYS 2 Room service appropriate? No; Fluid consistency: Nectar Thick  Diet effective now                   EDUCATION NEEDS:   Education needs have been addressed  Skin:  Skin Assessment: Reviewed RN Assessment  Last BM:  1/16  Height:   Ht Readings from Last 1 Encounters:  03/22/22 6' (1.829 m)    Weight:   Wt Readings from Last 1 Encounters:  03/28/22 73.2 kg    Ideal Body Weight:  80.9 kg  BMI:  Body mass index is 21.89 kg/m.  Estimated Nutritional Needs:   Kcal:  2100-2300  Protein:  105-120 grams  Fluid:  >/= 2 L    Kerman Passey MS, RDN, LDN, CNSC Registered Dietitian 3 Clinical Nutrition RD Pager and On-Call Pager Number Located in [place]

## 2022-03-28 NOTE — Progress Notes (Addendum)
PT Cancellation Note  Patient Details Name: William [name] MRN: [id number] DOB: June 14, 1940   Cancelled Treatment:    Reason Eval/Treat Not Completed: Patient at procedure or test/unavailable   Wyona Almas, PT, DPT Acute Rehabilitation Services Office [place] 03/28/2022, 9:34 AM

## 2022-03-28 NOTE — Progress Notes (Signed)
Patient seen and examined. No longer on CRRT or precedex. No longer requiring critical care services. We will sign off. Please call with questions.  Julian Hy, DO 03/28/22 6:33 PM [facility]  For contact information, see Amion. If no response to pager, please call PCCM consult pager. After hours, 7PM- 7AM, please call Elink.

## 2022-03-28 NOTE — Progress Notes (Signed)
ANTICOAGULATION CONSULT NOTE - Follow Up Consult  Pharmacy Consult for heparin Indication:  DVT in setting of stroke with petechial hemorrhage  Labs: Recent Labs    03/26/22 0453 03/26/22 1600 03/27/22 0444 03/27/22 1631 03/28/22 0334  HGB 8.8*  --  9.5*  --  9.3*  HCT 28.1*  --  30.8*  --  29.1*  PLT 247  --  278  --  263  APTT  --   --  40* 48* 43*  HEPARINUNFRC  --   --   --   --  >1.10*  CREATININE 3.58*   < > 1.65*  1.66* 1.38* 2.60*   < > = values in this interval not displayed.    Assessment: 82yo male subtherapeutic on heparin with lower PTT despite increased rate, required up to 1300 units/hr early this admission; no infusion issues or signs of bleeding per RN.  Goal of Therapy:  aPTT 66-85 seconds   Plan:  Will increase heparin infusion by 2-3 units/kg/hr to 1000 units/hr and check level in 8 hours.    Wynona Neat, PharmD, BCPS  03/28/2022,4:52 AM

## 2022-03-28 NOTE — Progress Notes (Addendum)
Physical Therapy Treatment  Patient Details Name: William [name] MRN: [id number] DOB: 1940/06/05 Today's Date: 03/28/2022   History of Present Illness Eaton Folmar is a 82 y.o. male presented to ED with AMS found to have PNA with HF exacerbation and L PCA CVA. 1/20 LLR DVT, 1/21 CRRT. PMHx: chronic HFrEF, ESRD on PD, hypertension, hyperlipidemia, BPH recently admitted 12/29-1/11 for acute hypoxic respiratory failure secondary to multifocal pneumonia and was intubated 12/29-1/1    PT Comments    Pt with increased somnolence today; keeping eyes closed throughout session and not following one step commands. Attempted to sit upright to promote alertness and provide stimulation without much success. Pt requiring two person mod-max assist for functional mobility. Stood from edge of bed 3 times to walker, but unable to sustain for extended period. Will continue to re-assess and progress as tolerated.     Recommendations for follow up therapy are one component of a multi-disciplinary discharge planning process, led by the attending physician.  Recommendations may be updated based on patient status, additional functional criteria and insurance authorization.  Follow Up Recommendations  Acute inpatient rehab (3hours/day)     Assistance Recommended at Discharge Frequent or constant Supervision/Assistance  Patient can return home with the following A little help with bathing/dressing/bathroom;Assistance with cooking/housework;Assist for transportation;Help with stairs or ramp for entrance;Direct supervision/assist for medications management;Direct supervision/assist for financial management;A lot of help with walking and/or transfers   Equipment Recommendations  Other (comment) (defer)    Recommendations for Other Services       Precautions / Restrictions Precautions Precautions: Fall Restrictions Weight Bearing Restrictions: No     Mobility  Bed Mobility Overal bed mobility: Needs  Assistance Bed Mobility: Supine to Sit, Sit to Supine, Rolling Rolling: Max assist   Supine to sit: Max assist Sit to supine: Max assist, +2 for physical assistance   General bed mobility comments: pt not initiating    Transfers Overall transfer level: Needs assistance Equipment used: Rolling walker (2 wheels) Transfers: Sit to/from Stand Sit to Stand: Mod assist, +2 physical assistance           General transfer comment: ModA + 2 to rise from edge of bed x 3, pt utlizing narrow BOS, unable to sustain for extended period.    Ambulation/Gait                   Stairs             Wheelchair Mobility    Modified Rankin (Stroke Patients Only)       Balance Overall balance assessment: Needs assistance Sitting-balance support: Feet supported Sitting balance-Leahy Scale: Fair     Standing balance support: Bilateral upper extremity supported, During functional activity Standing balance-Leahy Scale: Poor                              Cognition Arousal/Alertness: Lethargic Behavior During Therapy: Flat affect Overall Cognitive Status: Impaired/Different from baseline Area of Impairment: Following commands, Attention                   Current Attention Level: Focused   Following Commands: Follows one step commands inconsistently       General Comments: Increased somnolence today, keeping eyes closed throughout session, not following 1 step commands.        Exercises      General Comments        Pertinent Vitals/Pain Pain Assessment Pain Assessment: PAINAD Breathing:  occasional labored breathing, short period of hyperventilation Negative Vocalization: occasional moan/groan, low speech, negative/disapproving quality Facial Expression: smiling or inexpressive Body Language: tense, distressed pacing, fidgeting Consolability: distracted or reassured by voice/touch PAINAD Score: 4    Home Living                           Prior Function            PT Goals (current goals can now be found in the care plan section) Acute Rehab PT Goals Patient Stated Goal: Go home Potential to Achieve Goals: Fair Progress towards PT goals: Not progressing toward goals - comment    Frequency    Min 4X/week      PT Plan Current plan remains appropriate    Co-evaluation              AM-PAC PT "6 Clicks" Mobility   Outcome Measure  Help needed turning from your back to your side while in a flat bed without using bedrails?: A Lot Help needed moving from lying on your back to sitting on the side of a flat bed without using bedrails?: A Lot Help needed moving to and from a bed to a chair (including a wheelchair)?: Total Help needed standing up from a chair using your arms (e.g., wheelchair or bedside chair)?: Total Help needed to walk in hospital room?: Total Help needed climbing 3-5 steps with a railing? : Total 6 Click Score: 8    End of Session   Activity Tolerance: Patient limited by lethargy Patient left: in bed;with call bell/phone within reach;with bed alarm set;with family/visitor present Nurse Communication: Mobility status PT Visit Diagnosis: Unsteadiness on feet (R26.81);Muscle weakness (generalized) (M62.81);Difficulty in walking, not elsewhere classified (R26.2);Other symptoms and signs involving the nervous system (R29.898)     Time: 7322-0254 PT Time Calculation (min) (ACUTE ONLY): 22 min  Charges:  $Therapeutic Activity: 8-22 mins                     Wyona Almas, PT, DPT Acute Rehabilitation Services Office [phone]    Deno Etienne 03/28/2022, 4:55 PM

## 2022-03-28 NOTE — Progress Notes (Addendum)
PROGRESS NOTE    Dontario [name]  RXV:[id number] DOB: [birth year] DOA: 03/22/2022 PCP: [name], PA-C  Chief Complaint  Patient presents with   Weakness    Brief Narrative:  William [name] is William [name] 82 y.o. male with medical history significant of chronic HFrEF, ESRD on PD, hypertension, hyperlipidemia, BPH recently admitted 12/29-1/11 for acute hypoxic respiratory failure secondary to multifocal pneumonia and was intubated 12/29-1/1 presented to ED with AMS found to have PNA with HF exacerbation and acute stroke.    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)   Pneumonia   Macrocytic anemia   Acute on chronic combined systolic and diastolic heart failure (HCC)   Severe sepsis (HCC)   Acute metabolic encephalopathy   CAD (coronary artery disease)   Failure to thrive in adult   Acute hypoxemic respiratory failure (HCC)   Acute deep vein thrombosis (DVT) of calf muscle vein of left lower extremity (HCC)   Cerebrovascular accident (CVA) due to embolism of left posterior cerebral artery ([place])   Peritoneal dialysis catheter in place [facility])   Acute systolic heart failure (HCC)   ESRD (end stage renal disease) on dialysis (HCC)   Agitation   Acute on chronic HFrEF (heart failure with reduced ejection fraction) ([place])  Acute hypoxic Respiratory Failure  Resolved. Pt satting well on RA.  Thought due to pneumonia and HF exacerbation. Unasyn 1/19 - present (plan for 7 day course) Volume per renal, planning for IHD (s/p CRRT, now dc'd)   Metabolic encephalopathy Continued delirium today.  Multifactorial. Suspected hospital delirium in addition to combination of acute hypoxic respiratory failure, stroke, uremia?  Normal TSH, B12, ammonia S/p precedex  Currently receiving seroquel. Haldol prn. Delirium precautions   Hypothermia Resolved,  unclear cause - will follow and workup additionally as needed  Hypotension Midodrine 10 mg TID BP's improved, may need to adjust dose (may need dose reduction back to 5 mg, will monitor)  ESRD on home PD.   Nephrology following. CRRT now d/c'd, renal following and planning for IHD    CAD with chronic systolic heart failure due to ischemic cardiomyopathy EF 25% on recent echocardiogram.  Recent history of left heart cath showing multivessel CAD requiring DES to mid LAD on 03/14/2022.   Echo with EF 25-30%, intrapulmonary shunting Volume per renal  Continue heparin (eliquis when able) and plavix per cardiology.  Lipitor, zetia.  BP limit use of GDMT   Acute CVA  Occipital lobe stroke noted on CT head MRI shows acute stroke in the left PCA territory Unable to obtain MRA of the neck due to encephalopathy -> carotid US with near normal extracranial vessels, vertebral arteries with antegrade flow  Has MRA pending, doubt he'll be able to tolerate this  Echo with EF 25-30%, intrapulmonary shunting Transcranial doppler without evidence of right to left intracardiac communciation Was previously on DAPT -> now on heparin/plavix with DVT (plan for eliquis when able)   Acute DVT of LLE Eliquis   Dyslipidemia.  Chronic. LDL 32. Continue statin and Zetia.   Hypertension.  Chronic. Currently on beta-blocker.   Chronic macrocytic anemia.  B12, TSH stable.  PCP to monitor outpatient.   GERD - PPI  Right IJ in place - nontunneled HD catheter     DVT prophylaxis: heparin Code Status: DNR Family Communication: wife at bedside, daughter asleep in room as well  Disposition:  Status is: Inpatient Remains inpatient appropriate because: continued delirium, need for HD   Consultants:  Cardiology Neurology PCCM Palliative nephrology  Procedures:  Echo IMPRESSIONS     1. Left ventricular ejection fraction, by estimation, is 25 to 30%. The  left ventricle has severely decreased  function. The left ventricle  demonstrates global hypokinesis. The left ventricular internal cavity size  was severely dilated. There is mild  left ventricular hypertrophy. Left ventricular diastolic parameters are  indeterminate.   2. Right ventricular systolic function is moderately reduced. The right  ventricular size is mildly enlarged. Tricuspid regurgitation signal is  inadequate for assessing PA pressure.   3. The mitral valve is abnormal. Moderate mitral valve regurgitation.  Appears functional   4. The aortic valve was not well visualized. Aortic valve regurgitation  is mild.   5. Aortic dilatation noted. Aneurysm of the aortic root, measuring 48 mm.   6. The inferior vena cava is dilated in size with <50% respiratory  variability, suggesting right atrial pressure of 15 mmHg.   7. Agitated saline contrast bubble study was positive with shunting  observed after >6 cardiac cycles suggestive of intrapulmonary shunting.   Carotid doppler Summary:  Right Carotid: The extracranial vessels were near-normal with only minimal  wall                thickening or plaque.   Left Carotid: The extracranial vessels were near-normal with only minimal  wall               thickening or plaque.   Vertebrals:  Bilateral vertebral arteries demonstrate antegrade flow.  Subclavians: Normal flow hemodynamics were seen in bilateral subclavian               arteries.   Transcranial [place]  Summary:  No HITS at rest or during Valsalva. Negative transcranial Doppler Bubble  study with no evidence of right to left intracardiac communication.     Crista Nuon vascular evaluation was performed. The right middle cerebral artery was  studied. An IV was inserted into the patient's left antecubital. Verbal  informed consent was obtained.     LE [place] Summary:  RIGHT:  - Findings consistent with acute superficial vein thrombosis involving the  right small saphenous vein.  - There is no evidence of deep vein  thrombosis in the lower extremity.    - No cystic structure found in the popliteal fossa.    LEFT:  - Findings consistent with acute deep vein thrombosis involving the left  popliteal vein, and left peroneal veins.  - No cystic structure found in the popliteal fossa.     Mid LAD lesion is 85% stenosed.   Dist Cx lesion is 60% stenosed.   Prox Cx to Mid Cx lesion is 20% stenosed.   1st Diag lesion is 70% stenosed.   Torryn Hudspeth drug-eluting stent was successfully placed.   Post intervention, there is Raybon Conard 0% residual stenosis.   Coronary stent intervention Successful percutaneous coronary intervention to an 85 to 90% mildly tortuous mid LAD stenosis rated with PTCA, [place] Ball[place], and ultimate stenting with Lennan Malone 3.0 x 18 mm Onyx Frontier DES stent postdilated to 3.25 mm with the stenosis being reduced to 0%.   RECOMMENDATION: DAPT for minimum of 1 year.  Medical therapy for concomitant CAD.  The patient is on peritoneal dialysis.  Aggressive lipid-lowering therapy with target LDL less than 55.   Antimicrobials:  Anti-infectives (From admission, onward)    Start     Dose/Rate Route  Frequency Ordered Stop   03/26/22 2130  Ampicillin-Sulbactam (UNASYN) 3 g in sodium chloride 0.9 % 100 mL IVPB        3 g 200 mL/hr over 30 Minutes Intravenous Every 8 hours 03/26/22 1513 03/28/22 2359   03/26/22 2117  Ampicillin-Sulbactam (UNASYN) 3 g in sodium chloride 0.9 % 100 mL IVPB  Status:  Discontinued        3 g 200 mL/hr over 30 Minutes Intravenous Every 8 hours 03/26/22 1503 03/26/22 1513   03/23/22 2200  ceFEPIme (MAXIPIME) 1 g in sodium chloride 0.9 % 100 mL IVPB  Status:  Discontinued        1 g 200 mL/hr over 30 Minutes Intravenous Every 24 hours 03/23/22 0557 03/23/22 1018   03/23/22 1200  Ampicillin-Sulbactam (UNASYN) 3 g in sodium chloride 0.9 % 100 mL IVPB  Status:  Discontinued        3 g 200 mL/hr over 30 Minutes Intravenous Every 24 hours 03/23/22 1021 03/26/22 1503   03/22/22 1900  vancomycin  (VANCOCIN) 500 mg in sodium chloride 0.9 % 100 mL IVPB       See Hyperspace for full Linked Orders Report.   500 mg 100 mL/hr over 60 Minutes Intravenous  Once 03/22/22 1749 03/22/22 2208   03/22/22 1800  vancomycin (VANCOCIN) IVPB 1000 mg/200 mL premix       See Hyperspace for full Linked Orders Report.   1,000 mg 200 mL/hr over 60 Minutes Intravenous  Once 03/22/22 1749 03/22/22 1918   03/22/22 1749  vancomycin variable dose per unstable renal function (pharmacist dosing)  Status:  Discontinued         Does not apply See admin instructions 03/22/22 1749 03/23/22 1018   03/22/22 1745  ceFEPIme (MAXIPIME) 1 g in sodium chloride 0.9 % 100 mL IVPB        1 g 200 mL/hr over 30 Minutes Intravenous  Once 03/22/22 1741 03/22/22 2300       Subjective: Discussed with wife at bedside, continued delirium, confusion He hasn't said anything recently She notes he was working as recently as 2 weeks ago  Objective: Vitals:   03/28/22 0500 03/28/22 0600 03/28/22 0700 03/28/22 0827  BP: 118/65 130/62 126/83   Pulse: 86 84 87   Resp: (!) 33 (!) 31 (!) 30   Temp: 97.6 F (36.4 C)   98.5 F (36.9 C)  TempSrc: Axillary   Oral  SpO2: 95% 96% 96%   Weight: 73.2 kg     Height:        Intake/Output Summary (Last 24 hours) at 03/28/2022 0855 Last data filed at 03/28/2022 0700 Gross per 24 hour  Intake 723.14 ml  Output 477 ml  Net 246.14 ml   Filed Weights   03/25/22 1500 03/27/22 0500 03/28/22 0500  Weight: 80.3 kg 78.4 kg 73.2 kg    Examination:  General exam: Appears delirious and generally uncomfortable Respiratory system: diminished anterior exam with some scattered coarse lung sounds Cardiovascular system: RRR Gastrointestinal system: Abdomen is nondistended, soft and nontender. Central nervous system: agitated, unintelligible moaning, eyes tightly shut (when discussing with wife, she says he hasn't said anything intelligible to her) Extremities: no LEE    Data Reviewed: I have  personally reviewed following labs and imaging studies  CBC: Recent Labs  Lab 03/24/22 0554 03/25/22 0245 03/26/22 0453 03/27/22 0444 03/28/22 0334  WBC 7.3 8.1 6.2 6.8 8.1  NEUTROABS 5.9 6.7 4.9 5.4 6.5  HGB 9.3* 9.6* 8.8* 9.5* 9.3*  HCT 29.2* 30.1* 28.1* 30.8* 29.1*  MCV 105.4* 105.2* 107.3* 106.9* 104.7*  PLT 223 258 247 278 466    Basic Metabolic Panel: Recent Labs  Lab 03/24/22 0554 03/25/22 0245 03/26/22 0453 03/26/22 1600 03/27/22 0444 03/27/22 1631 03/28/22 0334  NA 138 138 139 136 137  136 136 137  K 4.4 4.0 4.1 4.8 4.2  4.2 4.3 4.4  CL 96* 96* 102 98 103  100 102 104  CO2 '22 22 26 25 25  25 25 24  '$ GLUCOSE 128* 164* 97 95 106*  107* 96 85  BUN 86* 87* 33* '22 12  12 11 18  '$ CREATININE 9.23* 9.24* 3.58* 2.66* 1.65*  1.66* 1.38* 2.60*  CALCIUM 7.7* 7.8* 7.5* 8.1* 7.9*  8.0* 7.8* 7.8*  MG 2.2 2.3 2.3  --  2.5*  --  2.6*  PHOS  --   --  3.8 2.7 2.0* 2.3* 3.2    GFR: Estimated Creatinine Clearance: 23.5 mL/min (Gwendalyn Mcgonagle) (by C-G formula based on SCr of 2.6 mg/dL (H)).  Liver Function Tests: Recent Labs  Lab 03/22/22 1557 03/26/22 0453 03/26/22 1600 03/27/22 0444 03/27/22 1631 03/28/22 0334  AST 34  --   --   --   --   --   ALT 42  --   --   --   --   --   ALKPHOS 70  --   --   --   --   --   BILITOT 0.7  --   --   --   --   --   PROT 6.3*  --   --   --   --   --   ALBUMIN 2.6* 2.2* 2.6* 2.2* 2.1* 2.1*    CBG: Recent Labs  Lab 03/22/22 1556 03/23/22 1322  GLUCAP 150* 106*     Recent Results (from the past 240 hour(s))  Culture, blood (Routine x 2)     Status: None   Collection Time: 03/22/22  4:00 PM   Specimen: Left Antecubital; Blood  Result Value Ref Range Status   Specimen Description   Final    LEFT ANTECUBITAL BLOOD Performed at [facility], [address] [place], [place] [address]    Special Requests   Final    Blood Culture adequate volume BOTTLES DRAWN AEROBIC AND ANAEROBIC Performed at [facility],  [address] [place], [place] [address]    Culture   Final    NO GROWTH 5 DAYS Performed at [facility], [place] [address], [place], [place] [address]    Report Status 03/27/2022 FINAL  Final  Culture, blood (Routine x 2)     Status: None   Collection Time: 03/22/22  4:05 PM   Specimen: BLOOD LEFT WRIST  Result Value Ref Range Status   Specimen Description   Final    BLOOD LEFT WRIST BLOOD Performed at [facility], [address] [place], [place] [address]    Special Requests   Final    Blood Culture adequate volume BOTTLES DRAWN AEROBIC AND ANAEROBIC Performed at [facility], [address] [place], [place] [address]    Culture   Final    NO GROWTH 5 DAYS Performed at [facility], [place] [address] [place], [place] [address]    Report Status 03/27/2022 FINAL  Final  MRSA Next Gen by PCR, Nasal     Status: None   Collection Time:  03/23/22  7:26 AM   Specimen: Nasal Mucosa; Nasal Swab  Result Value Ref Range Status   MRSA by PCR Next Gen NOT DETECTED NOT DETECTED Final    Comment: (NOTE) The GeneXpert MRSA Assay (FDA approved for NASAL specimens only), is one component of [facility] comprehensive MRSA colonization surveillance program. It is not intended to diagnose MRSA infection nor to guide or monitor treatment for MRSA infections. Test performance is not FDA approved in patients less than 22 years old. Performed at [facility], [place] [address], [place], [place] [address]          Radiology Studies: No results found.      Scheduled Meds:  atorvastatin  20 mg Oral Daily   Chlorhexidine Gluconate Cloth  6 each Topical Daily   clopidogrel  75 mg Oral Daily   docusate sodium  100 mg Oral Daily   ezetimibe  10 mg Oral Daily   feeding supplement  237 mL Oral BID BM   midodrine  10 mg Oral TID WC   multivitamin  1 tablet Oral QHS   pantoprazole  40 mg Oral Daily   polyethylene glycol  17 g Oral Daily   QUEtiapine  25 mg  Oral BID   Continuous Infusions:  sodium chloride Stopped (03/27/22 0528)   ampicillin-sulbactam (UNASYN) IV Stopped (03/28/22 0531)   heparin 1,000 Units/hr (03/28/22 0700)     LOS: 5 days    Time spent: over 30 min    Fayrene Helper, MD [facility]   To contact the attending provider between 7A-7P or the covering provider during after hours 7P-7A, please log into the web site [email or web address] and access using universal [place] password for that web site. If you do not have the password, please call the hospital operator.  03/28/2022, 8:55 AM

## 2022-03-28 NOTE — Progress Notes (Signed)
ANTICOAGULATION CONSULT NOTE - Follow Up Consult  Pharmacy Consult for apixaban > heparin Indication: DVT  Allergies  Allergen Reactions   Nsaids Other (See Comments)    Chronic kidney disease   Valacyclovir Nausea And Vomiting   Lisinopril Rash    Patient Measurements: Height: 6' (182.9 cm) Weight: 73.2 kg (161 lb 6 oz) IBW/kg (Calculated) : 77.6  Vital Signs: Temp: 99.2 F (37.3 C) (01/24 1159) Temp Source: Oral (01/24 1159) BP: 122/83 (01/24 1400) Pulse Rate: 87 (01/24 1400)  Labs: Recent Labs    03/26/22 0453 03/26/22 1600 03/27/22 0444 03/27/22 1631 03/28/22 0334 03/28/22 1321  HGB 8.8*  --  9.5*  --  9.3*  --   HCT 28.1*  --  30.8*  --  29.1*  --   PLT 247  --  278  --  263  --   APTT  --    < > 40* 48* 43* 60*  HEPARINUNFRC  --   --   --   --  >1.10*  --   CREATININE 3.58*   < > 1.65*  1.66* 1.38* 2.60*  --    < > = values in this interval not displayed.     Estimated Creatinine Clearance: 23.5 mL/min (A) (by C-G formula based on SCr of 2.6 mg/dL (H)).   Medical History: Past Medical History:  Diagnosis Date   Aortic insufficiency    Coronary artery disease    ESRD on peritoneal dialysis (HCC)    Hyperlipidemia    Hypertension     Medications:  Scheduled:   atorvastatin  20 mg Oral Daily   Chlorhexidine Gluconate Cloth  6 each Topical Daily   Chlorhexidine Gluconate Cloth  6 each Topical Q0600   clopidogrel  75 mg Oral Daily   [START ON 03/29/2022] darbepoetin (ARANESP) injection - DIALYSIS  60 mcg Subcutaneous Q Thu-1800   docusate sodium  100 mg Oral Daily   ezetimibe  10 mg Oral Daily   feeding supplement  237 mL Oral BID BM   midodrine  10 mg Oral TID WC   multivitamin  1 tablet Oral QHS   pantoprazole  40 mg Oral Daily   polyethylene glycol  17 g Oral Daily   QUEtiapine  25 mg Oral BID    Assessment: 4 YOM s/p DES 1/10 started on dapt then noted to have occipital lobe stroke via head CT, then found to found to have DVT started on  apixaban 1/20 - load avoided with recent stroke and DAPT. Asa dropped per cardiology after apixaban started.    Patient now on CRRT in ICU and will hold apixaban and start IV heparin drip. Will titrate heparin by aptt with recent apixaban interfering with heparin level.   Repeat aPTT this afternoon is slightly below goal at 60 seconds.    Goal of Therapy:  Aptt goal 66-85 sec  Monitor platelets by anticoagulation protocol: Yes   Plan:  Increase heparin infusion to 1150 units/h and recheck in [place], PharmD, [place], [place] Pharmacist [phone] Please check AMION for all [facility] numbers 03/28/2022

## 2022-03-28 NOTE — Progress Notes (Signed)
[facility] Progress Note   Assessment/ Plan:   1 Acute hypoxic RF: recurrent PNA + some component of volume overload.  Being treated with vanc/ cefepime--> Unasyn  modified PD script seemed to be somewhat effective first night it was done but pt became more encephalopathic with lower EF- needs HD at least for short term- likely would not tolerate IHD with EF down, therefore CRRT started 1/21 to 1/23.  No heparin as is on Eliquis. Will give him a break today and plan for IHD tomorrow. Provided he does okay with HD, next step is to convert his line to a TDC. Will d/w HD staff in regards to PD flush 2 ESRD: on PD- as above in #1  CRRT 1/21-1/23. HD tomorrow 3 Hypotension: midodrine 10 TID 4. Anemia of ESRD: Hgb 9.3, esa to start 1/25 5. Metabolic Bone Disease:  renal vitamin with fluid restriction 6.  NSTEMI: s/p LHC 1/10, on ASA/ Plavix 7.  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 Per primary service Dispo: palliative consulted, ongoing discussions for GOC  Subjective:    Patient seen and examined bedside. Wife and daughter at bedside. Stopped CRRT yesterday given concerns of lower BP and ongoing hypothermia. No other acute events   Objective:   BP 131/63   Pulse 84   Temp 98.5 F (36.9 C) (Oral)   Resp (!) 31   Ht 6' (1.829 m)   Wt 73.2 kg   SpO2 96%   BMI 21.89 kg/m   Physical Exam: GEN sleepy/somnolent, laying flat in bed PULM CTA b/l CV RRR, no rubs heard ABD: soft, nontender EXT 1+ LE edema Neuro: somnolent, unable to assess ACCESS: right quadrant PD cath C/d/I, RIJ temp HD cath  Labs: BMET Recent Labs  Lab 03/24/22 0554 03/25/22 0245 03/26/22 0453 03/26/22 1600 03/27/22 0444 03/27/22 1631 03/28/22 0334  NA 138 138 139 136 137  136 136 137  K 4.4 4.0 4.1 4.8 4.2  4.2 4.3 4.4  CL 96* 96* 102 98 103  100 102 104  CO2 '22 22 26 25 25  25 25 24  '$ GLUCOSE 128* 164* 97 95 106*  107* 96 85  BUN 86* 87* 33* '22 12  12 11 18  '$ CREATININE 9.23*  9.24* 3.58* 2.66* 1.65*  1.66* 1.38* 2.60*  CALCIUM 7.7* 7.8* 7.5* 8.1* 7.9*  8.0* 7.8* 7.8*  PHOS  --   --  3.8 2.7 2.0* 2.3* 3.2   CBC Recent Labs  Lab 03/25/22 0245 03/26/22 0453 03/27/22 0444 03/28/22 0334  WBC 8.1 6.2 6.8 8.1  NEUTROABS 6.7 4.9 5.4 6.5  HGB 9.6* 8.8* 9.5* 9.3*  HCT 30.1* 28.1* 30.8* 29.1*  MCV 105.2* 107.3* 106.9* 104.7*  PLT 258 247 278 263      Medications:     atorvastatin  20 mg Oral Daily   Chlorhexidine Gluconate Cloth  6 each Topical Daily   clopidogrel  75 mg Oral Daily   docusate sodium  100 mg Oral Daily   ezetimibe  10 mg Oral Daily   feeding supplement  237 mL Oral BID BM   midodrine  10 mg Oral TID WC   multivitamin  1 tablet Oral QHS   pantoprazole  40 mg Oral Daily   polyethylene glycol  17 g Oral Daily   QUEtiapine  25 mg Oral BID    Gean Quint, MD [facility] 03/28/2022, 9:13 AM

## 2022-03-28 NOTE — Progress Notes (Signed)
Palliative:  HPI: 82 y.o. male  with past medical history of chronic HFrEF, ESRD on PD, hypertension, hyperlipidemia, and BPH admitted on 03/22/2022 with weakness, confusion, and poor p.o. intake. Recently admitted 12/29-1/11 for acute hypoxic respiratory failure secondary to multifocal pneumonia and was intubated 12/29-1/1.  Hospital course complicated by NSTEMI requiring DES to mid LAD on 1/10 and delirium which required Precedex drip.  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  PMT consulted to discuss goals of care.       I met today at William [name]' bedside along with wife and daughter, William [name] and William [name]. William [name]' is lying in bed. He is moaning and mumbling but cannot understand anything he is saying. He is tense and jumps when I gently touch his shoulder. He is becoming more confused. I had a discussion with family at bedside about his significant decline over the last month. We discussed the burden of hemodialysis but especially with his heart failure. I explained in more detail the burdens and complications of heart failure and why this has lead to such significant decline. I voiced concern that even if he can tolerate intermittent HD I worry that his quality of life will still be much declined from previously. 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 We reviewed expectations moving forward as well as his current health situation in relation to his quality of life, wishes, and his directive. They are awaiting to see how he tolerates iHD because if he does not then they know that the options are very limited. They agree for family meeting after dialysis tomorrow. William [name] is tearful. William [name] is understanding and shares that her head knows what is going on. She also reports that she did not understand the heart failure extent and effects as much prior to conversation and this is the information that they need to make decisions moving forward.    I called and spoke with William [name] (daughter/HCPOA) who left message earlier concerned about discussion of feeding tube. Living Will is clear when reviewed. I discussed plan for iHD in the am and then recommended family meeting after. William [name] is currently with her sister, William [name], and they confirm meeting tomorrow 03/29/21 1200 noon. I called RN to inform family at bedside meeting time for tomorrow.   All questions/concerns addressed. Emotional support provided.   Exam: Sleeping. Confused when awake. Bradycardic. No distress. Breathing regular, unlabored. Abd flat. CRRT going and tolerating.   Plan: - Family meeting for goals of care 03/29/21 1200pm with William Rhodes, NP.   35 min  William Sill, NP Palliative Medicine Team Pager [phone] (Please see [email or web address] for schedule) Team Phone [phone]    Greater than 50%  of this time was spent counseling and coordinating care related to the above assessment and plan

## 2022-03-28 NOTE — Progress Notes (Signed)
Modified Barium Swallow Progress Note  Patient Details  Name: William [name] MRN: [id number] Date of Birth: 09/06/1940  Today's Date: 03/28/2022  Modified Barium Swallow completed.  Full report located under Chart Review in the Imaging Section.  Brief recommendations include the following:  Clinical Impression  Pt kept his eyes closed for the majority of the study and he exhibited difficulty following commands. The impact of these on his performance is considered. He presented with oropharyngeal dysphagia characterized by a pharyngeal delay and reduction in bolus cohesion, tongue base retraction, anterior laryngeal movement, and pharyngeal stripping. He demonstrated premature spillage to the valleculae and pyriform sinuses, base of tongue residue, vallecular residue, pyriform sinus residue, and intermittent incomplete epiglottic inversion. Penetration (PAS 5) and aspiration (PAS 7) were noted with thin liquids. Penetration was also observed with nectar thick liquids via straw. Laryngeal invasion was secondary to the pharyngeal delay, incomplete epiglottic inversion, and due to spillover of thin liquid pyriform sinus residue into the larynx. Pt's cough was weak, minimally effective in mobilizing aspirated material, and ineffective in expelling it from the larynx. Pt was noted to propel regular texture boluses to the hypopharynx, but no swallowing was demonstrated despite verbal prompts and tactile cues. The regular texture bolus was eventually cleared from the pharynx with a liquid wash. A chin tuck posture was attempted, but pt was unable to maintain it. Visualization of the esophagus revealed mild esophageal stasis with retrograde flow in the lower thoracic esophagus. Pt's case was discussed with RD who advised that the current plan is for Cortrak placement. Considering this, and pt's impaired pharyngeal clearance, SLP would opt for a dysphagia 2 diet. Nectar thick liquids are recommended with  observance of swallowing precautions. SLP will follow for dysphagia treatment.   Swallow Evaluation Recommendations       SLP Diet Recommendations: Dysphagia 2 (Fine chop) solids;Nectar thick liquid   Liquid Administration via: Cup;No straw   Medication Administration: Crushed with puree   Supervision: Full assist for feeding;Full supervision/cueing for compensatory strategies   Compensations: Small sips/bites;Slow rate;Follow solids with liquid   Postural Changes: Seated upright at 90 degrees   Oral Care Recommendations: Oral care BID      William [name] I. William [name], [name], [name] Office number [phone]   William [name] 03/28/2022,11:20 AM

## 2022-03-29 ENCOUNTER — Other Ambulatory Visit (HOSPITAL_COMMUNITY): Payer: Self-pay

## 2022-03-29 DIAGNOSIS — N186 End stage renal disease: Secondary | ICD-10-CM | POA: Diagnosis not present

## 2022-03-29 DIAGNOSIS — J9601 Acute respiratory failure with hypoxia: Secondary | ICD-10-CM | POA: Diagnosis not present

## 2022-03-29 DIAGNOSIS — Z7189 Other specified counseling: Secondary | ICD-10-CM | POA: Diagnosis not present

## 2022-03-29 DIAGNOSIS — Z66 Do not resuscitate: Secondary | ICD-10-CM

## 2022-03-29 DIAGNOSIS — I5043 Acute on chronic combined systolic (congestive) and diastolic (congestive) heart failure: Secondary | ICD-10-CM | POA: Diagnosis not present

## 2022-03-29 LAB — CBC
HCT: 30.2 % — ABNORMAL LOW (ref 39.0–52.0)
Hemoglobin: 9.6 g/dL — ABNORMAL LOW (ref 13.0–17.0)
MCH: 34 pg (ref 26.0–34.0)
MCHC: 31.8 g/dL (ref 30.0–36.0)
MCV: 107.1 fL — ABNORMAL HIGH (ref 80.0–100.0)
Platelets: 265 10*3/uL (ref 150–400)
RBC: 2.82 MIL/uL — ABNORMAL LOW (ref 4.22–5.81)
RDW: 15.4 % (ref 11.5–15.5)
WBC: 9.5 10*3/uL (ref 4.0–10.5)
nRBC: 0.5 % — ABNORMAL HIGH (ref 0.0–0.2)

## 2022-03-29 LAB — MAGNESIUM: Magnesium: 3 mg/dL — ABNORMAL HIGH (ref 1.7–2.4)

## 2022-03-29 LAB — APTT
aPTT: 76 seconds — ABNORMAL HIGH (ref 24–36)
aPTT: 78 seconds — ABNORMAL HIGH (ref 24–36)

## 2022-03-29 LAB — RENAL FUNCTION PANEL
Albumin: 2.2 g/dL — ABNORMAL LOW (ref 3.5–5.0)
Anion gap: 20 — ABNORMAL HIGH (ref 5–15)
BUN: 42 mg/dL — ABNORMAL HIGH (ref 8–23)
CO2: 18 mmol/L — ABNORMAL LOW (ref 22–32)
Calcium: 8.1 mg/dL — ABNORMAL LOW (ref 8.9–10.3)
Chloride: 103 mmol/L (ref 98–111)
Creatinine, Ser: 5.02 mg/dL — ABNORMAL HIGH (ref 0.61–1.24)
GFR, Estimated: 11 mL/min — ABNORMAL LOW (ref >60)
Glucose, Bld: 101 mg/dL — ABNORMAL HIGH (ref 70–99)
Phosphorus: 6 mg/dL — ABNORMAL HIGH (ref 2.5–4.6)
Potassium: 4.4 mmol/L (ref 3.5–5.1)
Sodium: 141 mmol/L (ref 135–145)

## 2022-03-29 LAB — HEPATITIS B SURFACE ANTIBODY, QUANTITATIVE: Hep B S AB Quant (Post): 18.3 m[IU]/mL (ref >9.9)

## 2022-03-29 LAB — HEPARIN LEVEL (UNFRACTIONATED): Heparin Unfractionated: >1.1 IU/mL — ABNORMAL HIGH (ref 0.30–0.70)

## 2022-03-29 MED ORDER — BIOTENE DRY MOUTH MT LIQD: 15.0000 mL | Freq: Three times a day (TID) | OROMUCOSAL | Status: DC

## 2022-03-29 MED ORDER — MIDODRINE HCL 5 MG PO TABS: 5.0000 mg | ORAL_TABLET | Freq: Three times a day (TID) | ORAL | Status: DC

## 2022-03-29 MED ORDER — HALOPERIDOL LACTATE 2 MG/ML PO CONC: 2.0000 mg | ORAL | Status: DC | PRN

## 2022-03-29 MED ORDER — HALOPERIDOL LACTATE 2 MG/ML PO CONC
2.0000 mg | ORAL | 0 refills | Status: AC | PRN
  Filled 2022-03-29: qty 18, 3d supply, fill #0

## 2022-03-29 MED ORDER — HEPARIN SODIUM (PORCINE) 1000 UNIT/ML DIALYSIS: 1000.0000 [IU] | INTRAMUSCULAR | Status: DC | PRN

## 2022-03-29 MED ORDER — ALTEPLASE 2 MG IJ SOLR: 2.0000 mg | Freq: Once | INTRAMUSCULAR | Status: DC | PRN

## 2022-03-29 MED ORDER — OXYCODONE HCL 5 MG/5ML PO SOLN: 5.0000 mg | ORAL | Status: DC | PRN

## 2022-03-29 MED ORDER — POLYVINYL ALCOHOL 1.4 % OP SOLN: 1.0000 [drp] | Freq: Four times a day (QID) | OPHTHALMIC | Status: DC | PRN

## 2022-03-29 MED ORDER — SODIUM CHLORIDE 0.9 % IV SOLN
3.0000 g | Freq: Three times a day (TID) | INTRAVENOUS | Status: DC
  Administered 2022-03-29: 3 g via INTRAVENOUS
  Filled 2022-03-29: qty 8

## 2022-03-29 MED ORDER — OXYCODONE HCL 20 MG/ML PO CONC: 5.0000 mg | ORAL | Status: DC | PRN

## 2022-03-29 MED ORDER — THIAMINE HCL 100 MG/ML IJ SOLN
500.0000 mg | INTRAVENOUS | Status: DC
  Administered 2022-03-29: 500 mg via INTRAVENOUS
  Filled 2022-03-29: qty 5

## 2022-03-29 MED ORDER — LIDOCAINE HCL (PF) 1 % IJ SOLN: 5.0000 mL | INTRAMUSCULAR | Status: DC | PRN

## 2022-03-29 MED ORDER — LORAZEPAM 2 MG/ML PO CONC
1.0000 mg | ORAL | 0 refills | Status: AC | PRN
  Filled 2022-03-29: qty 9, 3d supply, fill #0

## 2022-03-29 MED ORDER — GLYCOPYRROLATE 0.2 MG/ML IJ SOLN
0.2000 mg | INTRAMUSCULAR | Status: DC | PRN
  Administered 2022-03-29: 0.2 mg via INTRAVENOUS
  Filled 2022-03-29: qty 1

## 2022-03-29 MED ORDER — ANTICOAGULANT SODIUM CITRATE 4% (200MG/5ML) IV SOLN: 5.0000 mL | Status: DC | PRN

## 2022-03-29 MED ORDER — SCOPOLAMINE 1 MG/3DAYS TD PT72
1.0000 | MEDICATED_PATCH | TRANSDERMAL | Status: DC
  Administered 2022-03-29: 1.5 mg via TRANSDERMAL
  Filled 2022-03-29: qty 1

## 2022-03-29 MED ORDER — GLYCOPYRROLATE 0.2 MG/ML IJ SOLN
0.2000 mg | INTRAMUSCULAR | 0 refills | Status: AC | PRN
  Filled 2022-03-29: qty 2, 1d supply, fill #0

## 2022-03-29 MED ORDER — OXYCODONE HCL 5 MG/5ML PO SOLN
5.0000 mg | ORAL | 0 refills | Status: AC | PRN
  Filled 2022-03-29: qty 150, 3d supply, fill #0

## 2022-03-29 MED ORDER — LIDOCAINE-PRILOCAINE 2.5-2.5 % EX CREA: 1.0000 | TOPICAL_CREAM | CUTANEOUS | Status: DC | PRN

## 2022-03-29 MED ORDER — GLYCOPYRROLATE 0.2 MG/ML IJ SOLN: 0.2000 mg | INTRAMUSCULAR | Status: DC | PRN

## 2022-03-29 MED ORDER — PENTAFLUOROPROP-TETRAFLUOROETH EX AERO: 1.0000 | INHALATION_SPRAY | CUTANEOUS | Status: DC | PRN

## 2022-03-29 MED ORDER — SCOPOLAMINE 1 MG/3DAYS TD PT72
1.0000 | MEDICATED_PATCH | TRANSDERMAL | 12 refills | Status: AC
  Filled 2022-03-29: qty 10, 30d supply, fill #0

## 2022-03-29 MED ORDER — GLYCOPYRROLATE 1 MG PO TABS: 1.0000 mg | ORAL_TABLET | ORAL | Status: DC | PRN

## 2022-03-29 MED ORDER — LORAZEPAM 2 MG/ML PO CONC
1.0000 mg | ORAL | Status: DC | PRN
  Administered 2022-03-29: 1 mg via SUBLINGUAL
  Filled 2022-03-29: qty 1

## 2022-03-29 NOTE — Plan of Care (Signed)
Pt being d/c to home w/ hospice.  Transportation by [employer].

## 2022-03-29 NOTE — Progress Notes (Signed)
Nutrition Brief Note  Chart reviewed. Family meeting today, family agreeable to comfort measures and request d/c home with hospice services.  No further nutrition interventions planned at this time. Cortrak tube has been discontinued Please re-consult as needed.   Kerman Passey MS, RDN, LDN, CNSC Registered Dietitian 3 Clinical Nutrition RD Pager and On-Call Pager Number Located in [place]

## 2022-03-29 NOTE — Progress Notes (Signed)
PT Cancellation Note  Patient Details Name: William [name] MRN: [id number] DOB: 02-17-1941   Cancelled Treatment:    Reason Eval/Treat Not Completed: Patient at procedure or test/unavailable. Pt currently on HD.   Shary Decamp [facility] 03/29/2022, 8:58 AM [place] Office [phone]

## 2022-03-29 NOTE — Progress Notes (Addendum)
ANTICOAGULATION CONSULT NOTE - Follow Up Consult  Pharmacy Consult for apixaban > heparin Indication: DVT  Allergies  Allergen Reactions   Nsaids Other (See Comments)    Chronic kidney disease   Valacyclovir Nausea And Vomiting   Lisinopril Rash    Patient Measurements: Height: 6' (182.9 cm) Weight: 73.2 kg (161 lb 6 oz) IBW/kg (Calculated) : 77.6  Vital Signs: Temp: 100 F (37.8 C) (01/24 2351) Temp Source: Axillary (01/24 2351) BP: 128/64 (01/24 2300) Pulse Rate: 85 (01/24 2351)  Labs: Recent Labs    03/26/22 0453 03/26/22 1600 03/27/22 0444 03/27/22 1631 03/28/22 0334 03/28/22 1321 03/28/22 1613 03/28/22 2345  HGB 8.8*  --  9.5*  --  9.3*  --   --   --   HCT 28.1*  --  30.8*  --  29.1*  --   --   --   PLT 247  --  278  --  263  --   --   --   APTT  --    < > 40* 48* 43* 60*  --  78*  HEPARINUNFRC  --   --   --   --  >1.10*  --   --   --   CREATININE 3.58*   < > 1.65*  1.66* 1.38* 2.60*  --  3.83*  --    < > = values in this interval not displayed.     Estimated Creatinine Clearance: 15.9 mL/min (A) (by C-G formula based on SCr of 3.83 mg/dL (H)).   Medical History: Past Medical History:  Diagnosis Date   Aortic insufficiency    Coronary artery disease    ESRD on peritoneal dialysis (HCC)    Hyperlipidemia    Hypertension     Medications:  Scheduled:   atorvastatin  20 mg Oral Daily   Chlorhexidine Gluconate Cloth  6 each Topical Daily   Chlorhexidine Gluconate Cloth  6 each Topical Q0600   clopidogrel  75 mg Oral Daily   darbepoetin (ARANESP) injection - DIALYSIS  60 mcg Subcutaneous Q Thu-1800   docusate sodium  100 mg Oral Daily   ezetimibe  10 mg Oral Daily   feeding supplement  237 mL Oral TID BM   midodrine  10 mg Oral TID WC   multivitamin  1 tablet Oral QHS   pantoprazole  40 mg Oral Daily   polyethylene glycol  17 g Oral Daily   QUEtiapine  25 mg Oral BID    Assessment: 26 YOM s/p DES 1/10 started on dapt then noted to have  occipital lobe stroke via head CT, then found to found to have DVT started on apixaban 1/20 - load avoided with recent stroke and DAPT. Asa dropped per cardiology after apixaban started.    Patient now on CRRT in ICU and will hold apixaban and start IV heparin drip. Will titrate heparin by aptt with recent apixaban interfering with heparin level.   1/25 AM update:  aPTT therapeutic after rate increase  Goal of Therapy:  Heparin level 0.3-0.5 units/mL aPTT 66-84 secs Monitor platelets by anticoagulation protocol: Yes   Plan:  Cont heparin 1150 units/hr Heparin level and aPTT with AM labs  Narda Bonds, PharmD, [place] Pharmacist Phone: [phone]

## 2022-03-29 NOTE — Progress Notes (Addendum)
Received patient in bed to unit.  Respond to voice.  Informed consent signed and in chart.   HD Duration: 3H  Patient tolerated well.   Hand-off given to patient's nurse.   Access used: R IJ Access issues: None  Total UF removed: 1588m Medication(s) given: None Post HD weight: 72.7kg   [facility]    03/29/22 1122  Vitals  Temp 97.7 F (36.5 C)  Temp Source Axillary  BP 129/65  MAP (mmHg) 85  Pulse Rate 82  ECG Heart Rate 83  Resp (!) 24  Oxygen Therapy  SpO2 94 %  O2 Device Room Air  During Treatment Monitoring  Intra-Hemodialysis Comments Tolerated well;Tx completed  Post Treatment  Dialyzer Clearance Clear  Duration of HD Treatment -hour(s) 3 hour(s)  Hemodialysis Intake (mL) 0 mL  Liters Processed 47  Fluid Removed (mL) 1500 mL  Tolerated HD Treatment Yes  Hemodialysis Catheter Right Internal jugular Triple lumen Temporary (Non-Tunneled)  Placement Date/Time: 03/25/22 1430   Placed prior to admission: No  Time Out: Correct patient;Correct site;Correct procedure  Maximum sterile barrier precautions: Cap;Mask;Sterile gown;Large sterile sheet;Sterile gloves;Hand hygiene  Site Prep: Chlorh...  Site Condition No complications  Blue Lumen Status Dead end cap in place;Heparin locked  Red Lumen Status Dead end cap in place;Heparin locked  Purple Lumen Status Infusing  Catheter fill solution Heparin 1000 units/ml  Catheter fill volume (Arterial) 1.2 cc  Catheter fill volume (Venous) 1.2  Dressing Type Transparent  Dressing Status Antimicrobial disc in place;Clean, Dry, Intact  Interventions Other (Comment) (assessed)  Drainage Description None  Dressing Change Due 04/03/22  Post treatment catheter status Capped and Clamped

## 2022-03-29 NOTE — Progress Notes (Signed)
ANTICOAGULATION CONSULT NOTE - Follow Up Consult  Pharmacy Consult for apixaban > heparin Indication: DVT  Allergies  Allergen Reactions   Nsaids Other (See Comments)    Chronic kidney disease   Valacyclovir Nausea And Vomiting   Lisinopril Rash    Patient Measurements: Height: 6' (182.9 cm) Weight: 74 kg (163 lb 2.3 oz) IBW/kg (Calculated) : 77.6  Vital Signs: Temp: 97.5 F (36.4 C) (01/25 0757) Temp Source: Axillary (01/25 0757) BP: 130/64 (01/25 0845) Pulse Rate: 74 (01/25 0845)  Labs: Recent Labs    03/27/22 0444 03/27/22 1631 03/28/22 0334 03/28/22 1321 03/28/22 1613 03/28/22 2345 03/29/22 0533  HGB 9.5*  --  9.3*  --   --   --  9.6*  HCT 30.8*  --  29.1*  --   --   --  30.2*  PLT 278  --  263  --   --   --  265  APTT 40*   < > 43* 60*  --  78* 76*  HEPARINUNFRC  --   --  >1.10*  --   --   --  >1.10*  CREATININE 1.65*  1.66*   < > 2.60*  --  3.83*  --  5.02*   < > = values in this interval not displayed.     Estimated Creatinine Clearance: 12.3 mL/min (A) (by C-G formula based on SCr of 5.02 mg/dL (H)).   Medical History: Past Medical History:  Diagnosis Date   Aortic insufficiency    Coronary artery disease    ESRD on peritoneal dialysis (HCC)    Hyperlipidemia    Hypertension     Medications:  Scheduled:   atorvastatin  20 mg Oral Daily   Chlorhexidine Gluconate Cloth  6 each Topical Daily   Chlorhexidine Gluconate Cloth  6 each Topical Q0600   clopidogrel  75 mg Oral Daily   darbepoetin (ARANESP) injection - DIALYSIS  60 mcg Subcutaneous Q Thu-1800   docusate sodium  100 mg Oral Daily   ezetimibe  10 mg Oral Daily   feeding supplement  237 mL Oral TID BM   midodrine  10 mg Oral TID WC   multivitamin  1 tablet Oral QHS   pantoprazole  40 mg Oral Daily   polyethylene glycol  17 g Oral Daily    Assessment: 57 YOM s/p DES 1/10 started on dapt then noted to have occipital lobe stroke via head CT, then found to found to have DVT started on  apixaban 1/20 - load avoided with recent stroke and DAPT. Asa dropped per cardiology after apixaban started.    Patient now on CRRT in ICU and will hold apixaban and start IV heparin drip. Will titrate heparin by aptt with recent apixaban interfering with heparin level.   Heparin level remains elevated as expected with recent DOAC, aPTT is therapeutic at 76, on heparin infusion at 1150 units/hr. Hgb 9.6, plt 265. No s/sx of bleeding or infusion issues.   Goal of Therapy:  Heparin level 0.3-0.5 units/mL aPTT 66-84 secs Monitor platelets by anticoagulation protocol: Yes   Plan:  Cont heparin 1150 units/hr Heparin level and aPTT with AM labs  Antonietta Jewel, PharmD, [place] Pharmacist  Phone: [phone] 03/29/2022 12:11 PM  Please check AMION for all [place] phone numbers After 10:00 PM, call [place] [phone]

## 2022-03-29 NOTE — Progress Notes (Signed)
Palliative:  HPI: 82 y.o. male  with past medical history of chronic HFrEF, ESRD on PD, hypertension, hyperlipidemia, and BPH admitted on 03/22/2022 with weakness, confusion, and poor p.o. intake. Recently admitted 12/29-1/11 for acute hypoxic respiratory failure secondary to multifocal pneumonia and was intubated 12/29-1/1.  Hospital course complicated by NSTEMI requiring DES to mid LAD on 1/10 and delirium which required Precedex drip.  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  PMT consulted to discuss goals of care.       I met with Mr. Emberson' family: multiple people present to include wife Vaughan Basta, 3 daughters, 1 son, and 2 granddaughters. We review clinical situation. Review options of continuing to pursue aggressive medical interventions which at this point would likely include more HD and cortrak placement. Discussed other option of transitioning to focus on his comfort. Detailed what comfort measures would entail. After all questions addressed family expressed that continuing aggressive medical interventions would not align with Mr. Flatt' wishes and they feel he would want team to transition to focus on his comfort. All agree. We discussed transitioning in hospital and seeing how he does prior to making a decision about disposition vs trying to get him home ASAP. Family shares that patient would very much want to pass away at home so they do not want to spend any longer in the hospital. They request we expedite getting him home with hospice support. We discussed philosophy of hospice care and type of support provided. We discussed medications we will use to ensure Mr. Morino' comfort. We discussed signs to look for that would indicate discomfort. We discussed placing catheter and removing central line. Family agrees. Will transition to comfort measures only and consult hospice services - [facility].   All questions/concerns addressed. Emotional support provided.    Exam: Does not respond to voice or gentle touch. No distress. Breathing regular, unlabored. Abd flat.  Plan: Arranging for dc home with hospice support Place catheter Remove central line Comfort measures only, avoid morphine in renal failure Place scopolamine patch Signed DNR placed on chart  [place], DNP, [facility] Palliative Medicine Team Team Phone # [phone]  Pager # [phone]

## 2022-03-29 NOTE — Progress Notes (Signed)
Inpatient Rehabilitation Admissions Coordinator  Noted plans. Will sign off.  Danne Baxter, RN, MSN Rehab Admissions Coordinator [phone] 03/29/2022 1:28 PM

## 2022-03-29 NOTE — Progress Notes (Signed)
Pt's chart reviewed and noted pt going home with hospice services. [place] and spoke to [place], home therapy RN. Clinic advised pt to return home today with hospice services.   Melven Sartorius Renal Navigator [phone]

## 2022-03-29 NOTE — Progress Notes (Signed)
PROGRESS NOTE    William [name]  NFA:[id number] DOB: [birth year] DOA: 03/22/2022 PCP: William Bellows, PA-C  Chief Complaint  Patient presents with   Weakness    Brief Narrative:  William [name] is William [name] 82 y.o. male with medical history significant of chronic HFrEF, ESRD on PD, hypertension, hyperlipidemia, BPH recently admitted 12/29-1/11 for acute hypoxic respiratory failure secondary to multifocal pneumonia and was intubated 12/29-1/1 presented to ED with AMS found to have PNA with HF exacerbation and acute stroke.    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)   Pneumonia   Macrocytic anemia   Acute on chronic combined systolic and diastolic heart failure (HCC)   Severe sepsis (HCC)   Acute metabolic encephalopathy   CAD (coronary artery disease)   Failure to thrive in adult   Acute hypoxemic respiratory failure (HCC)   Acute deep vein thrombosis (DVT) of calf muscle vein of left lower extremity (HCC)   Cerebrovascular accident (CVA) due to embolism of left posterior cerebral artery ([place])   Peritoneal dialysis catheter in place [facility])   Acute systolic heart failure (HCC)   ESRD (end stage renal disease) on dialysis (HCC)   Agitation   Acute on chronic HFrEF (heart failure with reduced ejection fraction) ([place])  Goals of care Persistent delirium that seems to be worsening.  Family conversation planned with palliative today.    Acute hypoxic Respiratory Failure  Pt satting well on RA, but is tachypneic. Thought due to pneumonia and HF exacerbation. Unasyn 1/19 - 1/25 (plan for 7 day course) Volume per renal, planning for IHD (s/p CRRT, now dc'd) Temp of 100 noted on 1/24, normal WBC count, will continue to monitor.   Metabolic encephalopathy Continued delirium today.  Multifactorial. Suspected hospital delirium in addition to  combination of acute hypoxic respiratory failure, stroke, uremia?  Normal TSH, B12, ammonia High dose thiamine S/p precedex  Will d/c seroquel and follow. Haldol prn. Delirium precautions   Dysphagia SLP eval appreciated, recommending dysphagia 2, nectar thick liquids, meds crushed with puree Considering cortrak, goals of care conversations ongoing before this  Hypothermia Resolved, unclear cause - will follow and workup additionally as needed  Hypotension Midodrine 5 mg TID (follow for need for adjustment)  ESRD on home PD.   Nephrology following. CRRT now d/c'd, renal following and planning for IHD    CAD with chronic systolic heart failure due to ischemic cardiomyopathy EF 25% on recent echocardiogram.  Recent history of left heart cath showing multivessel CAD requiring DES to mid LAD on 03/14/2022.   Echo with EF 25-30%, intrapulmonary shunting Volume per renal  Continue heparin (eliquis when able) and plavix per cardiology.  Lipitor, zetia.  BP limit use of GDMT   Acute CVA  Occipital lobe stroke noted on CT head MRI shows acute stroke in the left PCA territory Unable to obtain MRA of the neck due to encephalopathy -> carotid US with near normal extracranial vessels, vertebral arteries with antegrade flow  Has MRA pending, doubt he'll be able to tolerate this with current delirium  Echo with EF 25-30%, intrapulmonary shunting Transcranial doppler without evidence of right to left intracardiac communciation Was previously on DAPT -> now on heparin/plavix with DVT (plan for eliquis when able)   Acute DVT of LLE Eliquis   Dyslipidemia.  Chronic. LDL 32. Continue statin and Zetia.   Hypertension.  Chronic. Currently  on beta-blocker.   Chronic macrocytic anemia.  B12, TSH stable.  PCP to monitor outpatient.   GERD - PPI  Right IJ in place - nontunneled HD catheter     DVT prophylaxis: heparin Code Status: DNR Family Communication: wife at bedside, daughter asleep  in room as well  Disposition:   Status is: Inpatient Remains inpatient appropriate because: continued delirium, need for HD   Consultants:  Cardiology Neurology PCCM Palliative nephrology  Procedures:  Echo IMPRESSIONS     1. Left ventricular ejection fraction, by estimation, is 25 to 30%. The  left ventricle has severely decreased function. The left ventricle  demonstrates global hypokinesis. The left ventricular internal cavity size  was severely dilated. There is mild  left ventricular hypertrophy. Left ventricular diastolic parameters are  indeterminate.   2. Right ventricular systolic function is moderately reduced. The right  ventricular size is mildly enlarged. Tricuspid regurgitation signal is  inadequate for assessing PA pressure.   3. The mitral valve is abnormal. Moderate mitral valve regurgitation.  Appears functional   4. The aortic valve was not well visualized. Aortic valve regurgitation  is mild.   5. Aortic dilatation noted. Aneurysm of the aortic root, measuring 48 mm.   6. The inferior vena cava is dilated in size with <50% respiratory  variability, suggesting right atrial pressure of 15 mmHg.   7. Agitated saline contrast bubble study was positive with shunting  observed after >6 cardiac cycles suggestive of intrapulmonary shunting.   Carotid doppler Summary:  Right Carotid: The extracranial vessels were near-normal with only minimal  wall                thickening or plaque.   Left Carotid: The extracranial vessels were near-normal with only minimal  wall               thickening or plaque.   Vertebrals:  Bilateral vertebral arteries demonstrate antegrade flow.  Subclavians: Normal flow hemodynamics were seen in bilateral subclavian               arteries.   Transcranial [place]  Summary:  No HITS at rest or during Valsalva. Negative transcranial Doppler Bubble  study with no evidence of right to left intracardiac communication.     Dionisios Ricci vascular  evaluation was performed. The right middle cerebral artery was  studied. An IV was inserted into the patient's left antecubital. Verbal  informed consent was obtained.     LE [place] Summary:  RIGHT:  - Findings consistent with acute superficial vein thrombosis involving the  right small saphenous vein.  - There is no evidence of deep vein thrombosis in the lower extremity.    - No cystic structure found in the popliteal fossa.    LEFT:  - Findings consistent with acute deep vein thrombosis involving the left  popliteal vein, and left peroneal veins.  - No cystic structure found in the popliteal fossa.     Mid LAD lesion is 85% stenosed.   Dist Cx lesion is 60% stenosed.   Prox Cx to Mid Cx lesion is 20% stenosed.   1st Diag lesion is 70% stenosed.   Brixon Zhen drug-eluting stent was successfully placed.   Post intervention, there is Kaelynne Christley 0% residual stenosis.   Coronary stent intervention Successful percutaneous coronary intervention to an 85 to 90% mildly tortuous mid LAD stenosis rated with PTCA, [place], and ultimate stenting with Minahil Quinlivan 3.0 x 18 mm Onyx Frontier DES stent postdilated to 3.25 mm with the  stenosis being reduced to 0%.   RECOMMENDATION: DAPT for minimum of 1 year.  Medical therapy for concomitant CAD.  The patient is on peritoneal dialysis.  Aggressive lipid-lowering therapy with target LDL less than 55.   Antimicrobials:  Anti-infectives (From admission, onward)    Start     Dose/Rate Route Frequency Ordered Stop   03/26/22 2130  Ampicillin-Sulbactam (UNASYN) 3 g in sodium chloride 0.9 % 100 mL IVPB        3 g 200 mL/hr over 30 Minutes Intravenous Every 8 hours 03/26/22 1513 03/28/22 2359   03/26/22 2117  Ampicillin-Sulbactam (UNASYN) 3 g in sodium chloride 0.9 % 100 mL IVPB  Status:  Discontinued        3 g 200 mL/hr over 30 Minutes Intravenous Every 8 hours 03/26/22 1503 03/26/22 1513   03/23/22 2200  ceFEPIme (MAXIPIME) 1 g in sodium chloride 0.9 % 100 mL IVPB   Status:  Discontinued        1 g 200 mL/hr over 30 Minutes Intravenous Every 24 hours 03/23/22 0557 03/23/22 1018   03/23/22 1200  Ampicillin-Sulbactam (UNASYN) 3 g in sodium chloride 0.9 % 100 mL IVPB  Status:  Discontinued        3 g 200 mL/hr over 30 Minutes Intravenous Every 24 hours 03/23/22 1021 03/26/22 1503   03/22/22 1900  vancomycin (VANCOCIN) 500 mg in sodium chloride 0.9 % 100 mL IVPB       See Hyperspace for full Linked Orders Report.   500 mg 100 mL/hr over 60 Minutes Intravenous  Once 03/22/22 1749 03/22/22 2208   03/22/22 1800  vancomycin (VANCOCIN) IVPB 1000 mg/200 mL premix       See Hyperspace for full Linked Orders Report.   1,000 mg 200 mL/hr over 60 Minutes Intravenous  Once 03/22/22 1749 03/22/22 1918   03/22/22 1749  vancomycin variable dose per unstable renal function (pharmacist dosing)  Status:  Discontinued         Does not apply See admin instructions 03/22/22 1749 03/23/22 1018   03/22/22 1745  ceFEPIme (MAXIPIME) 1 g in sodium chloride 0.9 % 100 mL IVPB        1 g 200 mL/hr over 30 Minutes Intravenous  Once 03/22/22 1741 03/22/22 2300       Subjective: Discussed with wife and daughter at bedside Planning for family meeting later today Discussed my concern regarding cortrak with general decline and his encephalopathy, though reasonable to trial if that's what they desire  Objective: Vitals:   03/28/22 0500 03/28/22 0600 03/28/22 0700 03/28/22 0827  BP: 118/65 130/62 126/83   Pulse: 86 84 87   Resp: (!) 33 (!) 31 (!) 30   Temp: 97.6 F (36.4 C)   98.5 F (36.9 C)  TempSrc: Axillary   Oral  SpO2: 95% 96% 96%   Weight: 73.2 kg     Height:        Intake/Output Summary (Last 24 hours) at 03/28/2022 0855 Last data filed at 03/28/2022 0700 Gross per 24 hour  Intake 723.14 ml  Output 477 ml  Net 246.14 ml   Filed Weights   03/25/22 1500 03/27/22 0500 03/28/22 0500  Weight: 80.3 kg 78.4 kg 73.2 kg    Examination:  General: No acute  distress. Cardiovascular: RRR Lungs: mildly tachypneic Abdomen: Soft, nontender, nondistended  Neurological: continued delirium, fidgeting on dialysis  Extremities: No clubbing or cyanosis. No edema.  Data Reviewed: I have personally reviewed following labs and imaging studies  CBC:  Recent Labs  Lab 03/24/22 0554 03/25/22 0245 03/26/22 0453 03/27/22 0444 03/28/22 0334  WBC 7.3 8.1 6.2 6.8 8.1  NEUTROABS 5.9 6.7 4.9 5.4 6.5  HGB 9.3* 9.6* 8.8* 9.5* 9.3*  HCT 29.2* 30.1* 28.1* 30.8* 29.1*  MCV 105.4* 105.2* 107.3* 106.9* 104.7*  PLT 223 258 247 278 160    Basic Metabolic Panel: Recent Labs  Lab 03/24/22 0554 03/25/22 0245 03/26/22 0453 03/26/22 1600 03/27/22 0444 03/27/22 1631 03/28/22 0334  NA 138 138 139 136 137  136 136 137  K 4.4 4.0 4.1 4.8 4.2  4.2 4.3 4.4  CL 96* 96* 102 98 103  100 102 104  CO2 '22 22 26 25 25  25 25 24  '$ GLUCOSE 128* 164* 97 95 106*  107* 96 85  BUN 86* 87* 33* '22 12  12 11 18  '$ CREATININE 9.23* 9.24* 3.58* 2.66* 1.65*  1.66* 1.38* 2.60*  CALCIUM 7.7* 7.8* 7.5* 8.1* 7.9*  8.0* 7.8* 7.8*  MG 2.2 2.3 2.3  --  2.5*  --  2.6*  PHOS  --   --  3.8 2.7 2.0* 2.3* 3.2    GFR: Estimated Creatinine Clearance: 23.5 mL/min (Tylah Mancillas) (by C-G formula based on SCr of 2.6 mg/dL (H)).  Liver Function Tests: Recent Labs  Lab 03/22/22 1557 03/26/22 0453 03/26/22 1600 03/27/22 0444 03/27/22 1631 03/28/22 0334  AST 34  --   --   --   --   --   ALT 42  --   --   --   --   --   ALKPHOS 70  --   --   --   --   --   BILITOT 0.7  --   --   --   --   --   PROT 6.3*  --   --   --   --   --   ALBUMIN 2.6* 2.2* 2.6* 2.2* 2.1* 2.1*    CBG: Recent Labs  Lab 03/22/22 1556 03/23/22 1322  GLUCAP 150* 106*     Recent Results (from the past 240 hour(s))  Culture, blood (Routine x 2)     Status: None   Collection Time: 03/22/22  4:00 PM   Specimen: Left Antecubital; Blood  Result Value Ref Range Status   Specimen Description   Final    LEFT  ANTECUBITAL BLOOD Performed at [facility], [address] [place], [place] [address]    Special Requests   Final    Blood Culture adequate volume BOTTLES DRAWN AEROBIC AND ANAEROBIC Performed at [facility], [address], [place], [place] [address]    Culture   Final    NO GROWTH 5 DAYS Performed at [facility], [place] [address] [place], [place] [address]    Report Status 03/27/2022 FINAL  Final  Culture, blood (Routine x 2)     Status: None   Collection Time: 03/22/22  4:05 PM   Specimen: BLOOD LEFT WRIST  Result Value Ref Range Status   Specimen Description   Final    BLOOD LEFT WRIST BLOOD Performed at [facility], [address] [place], [place] [address]    Special Requests   Final    Blood Culture adequate volume BOTTLES DRAWN AEROBIC AND ANAEROBIC Performed at [facility], [address] [place], [place] [address]    Culture   Final    NO GROWTH 5 DAYS Performed at [facility], [place] [address], [place], [place] [address]    Report Status 03/27/2022 FINAL  Final  MRSA Next Gen by PCR, Nasal     Status: None   Collection Time: 03/23/22  7:26 AM   Specimen: Nasal Mucosa; Nasal Swab  Result Value Ref Range Status   MRSA by PCR Next Gen NOT DETECTED NOT DETECTED Final    Comment: (NOTE) The GeneXpert MRSA Assay (FDA approved for NASAL specimens only), is one component of Starr Urias comprehensive MRSA colonization surveillance program. It is not intended to diagnose MRSA infection nor to guide or monitor treatment for MRSA infections. Test performance is not FDA approved in patients less than 32 years old. Performed at [facility], [place] [address] [place], [place] [address]          Radiology Studies: No results found.      Scheduled Meds:  atorvastatin  20 mg Oral Daily   Chlorhexidine Gluconate Cloth  6 each Topical Daily   clopidogrel  75 mg Oral Daily   docusate sodium  100 mg Oral Daily    ezetimibe  10 mg Oral Daily   feeding supplement  237 mL Oral BID BM   midodrine  10 mg Oral TID WC   multivitamin  1 tablet Oral QHS   pantoprazole  40 mg Oral Daily   polyethylene glycol  17 g Oral Daily   QUEtiapine  25 mg Oral BID   Continuous Infusions:  sodium chloride Stopped (03/27/22 0528)   ampicillin-sulbactam (UNASYN) IV Stopped (03/28/22 0531)   heparin 1,000 Units/hr (03/28/22 0700)     LOS: 5 days    Time spent: over 30 min    Fayrene Helper, MD [facility]   To contact the attending provider between 7A-7P or the covering provider during after hours 7P-7A, please log into the web site [email or web address] and access using universal  password for that web site. If you do not have the password, please call the hospital operator.  03/28/2022, 8:55 AM

## 2022-03-29 NOTE — Progress Notes (Signed)
PT Cancellation/DC Note  Patient Details Name: William [name] MRN: [id number] DOB: 10-29-1940   Cancelled Treatment:    Reason Eval/Treat Not Completed: Other (comment). Pt to transition to comfort care. Signing off.    Shary Decamp [facility] 03/29/2022, 12:32 PM [place] Office [phone]

## 2022-03-29 NOTE — TOC Transition Note (Signed)
Transition of Care [facility]) - CM/SW Discharge Note   Patient Details  Name: William [name] MRN: [id number] Date of Birth: 1940-04-03  [facility] CM/SW Contact:  Amador Cunas, [place] Phone Number: 03/29/2022, 2:11 PM   Clinical Narrative: Pt for dc home with hospice services provided by [facility]. Pt's wife and dtr aware of dc and report agreeable. [facility]OP to meet pt at home tonight around 630pm. [facility] has been arranged for transport. SW signing off at dc.   Wandra Feinstein, MSW, LCSW [phone] (coverage)        Final next level of care: Home w Hospice Care Barriers to Discharge: No Barriers Identified   Patient Goals and CMS Choice      Discharge Placement                  Patient to be transferred to facility by: [facility] for transport home Name of family member notified: Janie/dtr Patient and family notified of of transfer: 03/29/22  Discharge Plan and Services Additional resources added to the After Visit Summary for                                       Social Determinants of Health (SDOH) Interventions SDOH Screenings   Food Insecurity: No Food Insecurity (03/25/2022)  Housing: Low Risk  (03/25/2022)  Transportation Needs: No Transportation Needs (03/25/2022)  Utilities: Not At Risk (03/25/2022)  Tobacco Use: Medium Risk (03/22/2022)     Readmission Risk Interventions     No data to display

## 2022-03-29 NOTE — Progress Notes (Signed)
This RN attempted to administer 0800 midodrine dosage. Medication crushed and put in small spoon of applesauce. Patient required much prompting to take bite off of spoon but able to do so. Two small bites of applesauce taken with medication and what appears to be adequate swallow. One spoonful of nectar thick water taken from spoon with immediate coughing exhibited. Ensured that patient cleared mouth and oral care given after coughing. Dr. Florene Glen made aware of findings and informed of medications at 1000. Okay to hold off on medications for now until family meeting at 36 today.

## 2022-03-29 NOTE — Progress Notes (Addendum)
Per Palliative Care, pt's family agreeable to comfort measures and requesting pt dc home with hospice services. Spoke to pt's wife Vaughan Basta and dtr Narda Rutherford who confirm agreeable to hospice and requesting [place] ([facility]). Referral made to Roxanne with HOP who will eval pt and f/u pt's family.   Anticipate dc today pending SOC date for hospice. Will provide updates as available.    UPDATE 1350: [facility] has accepted pt for hospice services and plan to admit pt to services this evening at approximately 6:30pm. Pt's family in agreement to dc plan. MD updated.   Wandra Feinstein, MSW, LCSW [phone] (coverage)

## 2022-03-29 NOTE — Discharge Summary (Signed)
Physician Discharge Summary  William [name] WUJ:[id number] DOB: 10/03/1940 DOA: 03/22/2022  PCP: [name], PA-C  Admit date: 03/22/2022 Discharge date: 03/29/2022  Time spent: 40 minutes  Recommendations for Outpatient Follow-up:  Follow comfort measures per hospice outpatient    Discharge Diagnoses:  Principal Problem:   Acute respiratory failure with hypoxia [facility]) Active Problems:   Hyperlipidemia   End-stage renal disease on peritoneal dialysis ([place])   Pneumonia   Macrocytic anemia   Acute on chronic combined systolic and diastolic heart failure (HCC)   Severe sepsis (HCC)   Acute metabolic encephalopathy   CAD (coronary artery disease)   Failure to thrive in adult   Acute hypoxemic respiratory failure (HCC)   Acute deep vein thrombosis (DVT) of calf muscle vein of left lower extremity ([place])   Cerebrovascular accident (CVA) due to embolism of left posterior cerebral artery ([place])   Peritoneal dialysis catheter in place [facility])   Acute systolic heart failure ([place])   ESRD (end stage renal disease) on dialysis ([place])   Agitation   Acute on chronic HFrEF (heart failure with reduced ejection fraction) ([place])   Protein-calorie malnutrition, severe   Discharge Condition: stable  Diet recommendation: dysphagia 2/nectar thick diet if within goals of care to continue to feed by mouth  Filed Weights   03/29/22 0500 03/29/22 0755 03/29/22 1128  Weight: 74 kg 73.5 kg 72.7 kg    History of present illness:  William [name] is William [name] 82 y.o. male with medical history significant of chronic HFrEF, ESRD on PD, hypertension, hyperlipidemia, BPH recently admitted 12/29-1/11 for acute hypoxic respiratory failure secondary to multifocal pneumonia and was intubated 12/29-1/1 presented to ED with AMS found to have PNA with HF exacerbation and acute stroke.     He declined with worsening delirium during this hospitalization.  Meeting with palliative care today and decision was made for  comfort measures and to transition home with hospice.  See below and previous progress notes for additional details.   Hospital Course:  Assessment and Plan: Goals of care Persistent delirium that seems to be worsening.  Family conversation planned with palliative today.  After discussion with palliative today, they've decided to transition him home for comfort measures.  Palliative care note currently pending, but plan for discharge home with hospice today for comfort measures.   Acute hypoxic Respiratory Failure  Pt satting well on RA, but is tachypneic. Thought due to pneumonia and HF exacerbation. Unasyn 1/19 - 1/25 (plan for 7 day course) Volume per renal, planning for IHD (s/p CRRT, now dc'd) Temp of 100 noted on 1/24, normal WBC count, will continue to monitor. Above now d/c'd with plan for comfort measures   Metabolic encephalopathy Continued delirium today.  Multifactorial. Suspected hospital delirium in addition to combination of acute hypoxic respiratory failure, stroke, uremia?  Normal TSH, B12, ammonia High dose thiamine S/p precedex  Will d/c seroquel and follow. Haldol prn. Delirium precautions.  Comfort measures now, home with hospice.   Dysphagia SLP eval appreciated, recommending dysphagia 2, nectar thick liquids, meds crushed with puree if within goals of care to continue to feed by mouth   Hypothermia Resolved, unclear cause - will follow and workup additionally as needed   Hypotension Midodrine 5 mg TID (follow for need for adjustment) D/c at discharge with comfort measures   ESRD on home PD.   Nephrology following. CRRT now d/c'd, renal following and planning for IHD  Dialysis now d/c'd with plan for comfort   CAD with chronic systolic  heart failure due to ischemic cardiomyopathy EF 25% on recent echocardiogram.  Recent history of left heart cath showing multivessel CAD requiring DES to mid LAD on 03/14/2022.   Echo with EF 25-30%, intrapulmonary  shunting Volume per renal  Continue heparin (eliquis when able) and plavix per cardiology.  Lipitor, zetia.  BP limit use of GDMT   Acute CVA  Occipital lobe stroke noted on CT head MRI shows acute stroke in the left PCA territory Unable to obtain MRA of the neck due to encephalopathy -> carotid US with near normal extracranial vessels, vertebral arteries with antegrade flow  Has MRA pending, doubt he'll be able to tolerate this with current delirium  Echo with EF 25-30%, intrapulmonary shunting Transcranial doppler without evidence of right to left intracardiac communciation Was previously on DAPT -> now on heparin/plavix with DVT (plan for eliquis when able) Plan for comfort   Acute DVT of LLE Eliquis    Dyslipidemia.  Chronic. LDL 32. Continue statin and Zetia.   Hypertension.  Chronic. Currently on beta-blocker.   Chronic macrocytic anemia.  B12, TSH stable.  PCP to monitor outpatient.   GERD - PPI   Right IJ in place - nontunneled HD catheter - will remove prior to discharge  Foley cathter to be placed prior to discharge       Procedures: Echo IMPRESSIONS     1. Left ventricular ejection fraction, by estimation, is 25 to 30%. The  left ventricle has severely decreased function. The left ventricle  demonstrates global hypokinesis. The left ventricular internal cavity size  was severely dilated. There is mild  left ventricular hypertrophy. Left ventricular diastolic parameters are  indeterminate.   2. Right ventricular systolic function is moderately reduced. The right  ventricular size is mildly enlarged. Tricuspid regurgitation signal is  inadequate for assessing PA pressure.   3. The mitral valve is abnormal. Moderate mitral valve regurgitation.  Appears functional   4. The aortic valve was not well visualized. Aortic valve regurgitation  is mild.   5. Aortic dilatation noted. Aneurysm of the aortic root, measuring 48 mm.   6. The inferior vena cava is  dilated in size with <50% respiratory  variability, suggesting right atrial pressure of 15 mmHg.   7. Agitated saline contrast bubble study was positive with shunting  observed after >6 cardiac cycles suggestive of intrapulmonary shunting.    Carotid doppler Summary:  Right Carotid: The extracranial vessels were near-normal with only minimal  wall                thickening or plaque.   Left Carotid: The extracranial vessels were near-normal with only minimal  wall               thickening or plaque.   Vertebrals:  Bilateral vertebral arteries demonstrate antegrade flow.  Subclavians: Normal flow hemodynamics were seen in bilateral subclavian               arteries.    Transcranial [place]  Summary:  No HITS at rest or during Valsalva. Negative transcranial Doppler Bubble  study with no evidence of right to left intracardiac communication.     Shalane Florendo vascular evaluation was performed. The right middle cerebral artery was  studied. An IV was inserted into the patient's left antecubital. Verbal  informed consent was obtained.     LE [place] Summary:  RIGHT:  - Findings consistent with acute superficial vein thrombosis involving the  right small saphenous vein.  - There is no  evidence of deep vein thrombosis in the lower extremity.    - No cystic structure found in the popliteal fossa.    LEFT:  - Findings consistent with acute deep vein thrombosis involving the left  popliteal vein, and left peroneal veins.  - No cystic structure found in the popliteal fossa.      Mid LAD lesion is 85% stenosed.   Dist Cx lesion is 60% stenosed.   Prox Cx to Mid Cx lesion is 20% stenosed.   1st Diag lesion is 70% stenosed.   Herberto Ledwell drug-eluting stent was successfully placed.   Post intervention, there is Clete Kuch 0% residual stenosis.   Coronary stent intervention Successful percutaneous coronary intervention to an 85 to 90% mildly tortuous mid LAD stenosis rated with PTCA, Cutting Balloon, and ultimate stenting  with Denaly Gatling 3.0 x 18 mm Onyx Frontier DES stent postdilated to 3.25 mm with the stenosis being reduced to 0%.   RECOMMENDATION: DAPT for minimum of 1 year.  Medical therapy for concomitant CAD.  The patient is on peritoneal dialysis.  Aggressive lipid-lowering therapy with target LDL less than 55.   Consultations: Palliative PCCM Renal  Cardiology neurology  Discharge Exam: Vitals:   03/29/22 1200 03/29/22 1230  BP: (!) 120/56 (!) 114/59  Pulse: 72 74  Resp: (!) 30 (!) 24  Temp:    SpO2: 98% 99%   See progress note from 1/25 Palliative care discussion resulted in plan for home with hospice  Discharge Instructions   Discharge Instructions     Ambulatory referral to Neurology   Complete by: As directed    Follow up with stroke clinic NP (Jessica Vanschaick or Cecille Rubin, if both not available, consider Zachery Dauer, or Ahern) at [facility] in about 4 weeks. Thanks.   Diet - low sodium heart healthy   Complete by: As directed    Discharge instructions   Complete by: As directed    You came to the hospital with confusion and were found to have pneumonia and overload in addition to Harini Dearmond stroke.  After several days of supportive care, we made the decision with palliative cares assistance to transition to comfort measures/hospice.  We'll discharge home today with comfort medications per hospice and palliative care.  Please let us know if you have any questions or concerns.  You can follow up this hospitalization with your PCP or the palliative care providers.   Increase activity slowly   Complete by: As directed    No wound care   Complete by: As directed       Allergies as of 03/29/2022       Reactions   Nsaids Other (See Comments)   Chronic kidney disease   Valacyclovir Nausea And Vomiting   Lisinopril Rash        Medication List     STOP taking these medications    acetaminophen 325 MG tablet Commonly known as: TYLENOL   aspirin 81 MG chewable tablet    atorvastatin 20 MG tablet Commonly known as: LIPITOR   clopidogrel 75 MG tablet Commonly known as: PLAVIX   ezetimibe 10 MG tablet Commonly known as: ZETIA   melatonin 3 MG Tabs tablet   metoprolol tartrate 25 MG tablet Commonly known as: LOPRESSOR   ondansetron 4 MG tablet Commonly known as: Zofran   sevelamer carbonate 800 MG tablet Commonly known as: RENVELA       TAKE these medications    glycopyrrolate 0.2 MG/ML injection Commonly known as: ROBINUL Inject 1 mL (0.2  mg total) into the skin every 4 (four) hours as needed (excessive secretions).   haloperidol 2 MG/ML solution Commonly known as: HALDOL Place 1 mL (2 mg total) under the tongue every 4 (four) hours as needed for up to 3 days for agitation (or delirium).   LORazepam 2 MG/ML concentrated solution Commonly known as: ATIVAN Place 0.5 mLs (1 mg total) under the tongue every 4 (four) hours as needed for anxiety.   oxyCODONE 5 MG/5ML solution Commonly known as: ROXICODONE Take 5 mLs (5 mg total) by mouth every 2 (two) hours as needed for up to 3 days for moderate pain (or dyspnea).   pantoprazole 40 MG tablet Commonly known as: PROTONIX Take 1 tablet (40 mg total) by mouth daily.   scopolamine 1 MG/3DAYS Commonly known as: TRANSDERM-SCOP Place 1 patch (1.5 mg total) onto the skin every 3 (three) days.       Allergies  Allergen Reactions   Nsaids Other (See Comments)    Chronic kidney disease   Valacyclovir Nausea And Vomiting   Lisinopril Rash    Follow-up Information     [facility]. Schedule an appointment as soon as possible for Deepa Barthel visit in 1 month(s).   Specialty: Neurology Why: stroke clinic Contact information: [address] [place] [phone]                 The results of significant diagnostics from this hospitalization (including imaging, microbiology, ancillary and laboratory) are listed below for  reference.    Significant Diagnostic Studies: DG Swallowing Func-Speech Pathology  Result Date: 03/28/2022 Table formatting from the original result was not included. Objective Swallowing Evaluation: Type of Study: MBS-Modified Barium Swallow Study  Patient Details Name: Hjalmar Ballengee MRN: [id number] Date of Birth: 1941-02-24 Today's Date: 03/28/2022 Time: SLP Start Time (ACUTE ONLY): 0930 -SLP Stop Time (ACUTE ONLY): 4627 SLP Time Calculation (min) (ACUTE ONLY): 20 min Past Medical History: Past Medical History: Diagnosis Date  Aortic insufficiency   Coronary artery disease   ESRD on peritoneal dialysis ([place])   Hyperlipidemia   Hypertension  Past Surgical History: Past Surgical History: Procedure Laterality Date  APPENDECTOMY    CARDIOVASCULAR STRESS TEST  06/08/2009  EF 43%  CHOLECYSTECTOMY    CORONARY STENT INTERVENTION N/Braxdon Gappa 03/14/2022  Procedure: CORONARY STENT INTERVENTION;  Surgeon: Troy Sine, MD;  Location: [employer] [facility];  Service: Cardiovascular;  Laterality: N/Darly Massi;  FOOT SURGERY    HERNIA REPAIR    LEFT HEART CATH AND CORONARY ANGIOGRAPHY N/Kamren Heintzelman 03/12/2022  Procedure: LEFT HEART CATH AND CORONARY ANGIOGRAPHY;  Surgeon: Nelva Bush, MD;  Location: [place] CV [facility];  Service: Cardiovascular;  Laterality: N/Ewing Fandino;  US ECHOCARDIOGRAPHY  05/24/2009  EF 60% HPI: Pt is an 82 year old male presenting with low blood pressure, SpO2 in the high 80s, confusion, and generalized weakness. Suspected bilateral multifocal PNA and severe sepsis. Head CT 1/19 reveals subacute occipital CVA. Swallow eval during recent admission was concerning for esophageal component. PMH includes PNA, reflux, GERD, combined CHF, ESRD on PD, HTN, HLD and BPH.  Subjective: pt says large pills primarily cause reflux  Recommendations for follow up therapy are one component of Linda Grimmer multi-disciplinary discharge planning process, led by the attending physician.  Recommendations may be updated based on patient status, additional functional  criteria and insurance authorization. Assessment / Plan / Recommendation   03/28/2022  10:29 AM Clinical Impressions Clinical Impression Pt kept his eyes closed for the majority of the  study and he exhibited difficulty following commands. The impact of these on his performance is considered. He presented with oropharyngeal dysphagia characterized by Salome Cozby pharyngeal delay and reduction in bolus cohesion, tongue base retraction, anterior laryngeal movement, and pharyngeal stripping. He demonstrated premature spillage to the valleculae and pyriform sinuses, base of tongue residue, vallecular residue, pyriform sinus residue, and intermittent incomplete epiglottic inversion. Penetration (PAS 5) and aspiration (PAS 7) were noted with thin liquids. Penetration was also observed with nectar thick liquids via straw. Laryngeal invasion was secondary to the pharyngeal delay, incomplete epiglottic inversion, and due to spillover of thin liquid pyriform sinus residue into the larynx. Pt's cough was weak, minimally effective in mobilizing aspirated material, and ineffective in expelling it from the larynx. Pt was noted to propel regular texture boluses to the hypopharynx, but no swallowing was demonstrated despite verbal prompts and tactile cues. The regular texture bolus was eventually cleared from the pharynx with Kipling Graser liquid wash. Nevena Rozenberg chin tuck posture was attempted, but pt was unable to maintain it. Visualization of the esophagus revealed mild esophageal stasis with retrograde flow in the lower thoracic esophagus. Pt's case was discussed with RD who advised that the current plan is for Cortrak placement. Considering this, and pt's impaired pharyngeal clearance, SLP would opt for Dayron Odland dysphagia 2 diet. Nectar thick liquids are recommended with observance of swallowing precautions. SLP will follow for dysphagia treatment. SLP Visit Diagnosis Dysphagia, oropharyngeal phase (R13.12) Impact on safety and function Mild aspiration risk;Moderate  aspiration risk     03/28/2022  10:29 AM Treatment Recommendations Treatment Recommendations Therapy as outlined in treatment plan below     03/28/2022  10:29 AM Prognosis Prognosis for Safe Diet Advancement Good Barriers to Reach Goals Severity of deficits   03/28/2022  10:29 AM Diet Recommendations SLP Diet Recommendations Dysphagia 2 (Fine chop) solids;Nectar thick liquid Liquid Administration via Cup;No straw Medication Administration Crushed with puree Compensations Small sips/bites;Slow rate;Follow solids with liquid Postural Changes Seated upright at 90 degrees     03/28/2022  10:29 AM Other Recommendations Oral Care Recommendations Oral care BID Follow Up Recommendations Acute inpatient rehab (3hours/day) Functional Status Assessment Patient has had Japhet Morgenthaler recent decline in their functional status and demonstrates the ability to make significant improvements in function in Soyla Bainter reasonable and predictable amount of time.   03/28/2022  10:29 AM Frequency and Duration  Speech Therapy Frequency (ACUTE ONLY) min 2x/week Treatment Duration 2 weeks     03/28/2022  10:29 AM Oral Phase Oral Phase Impaired Oral - Nectar Cup Decreased bolus cohesion;Premature spillage Oral - Nectar Straw Decreased bolus cohesion;Premature spillage Oral - Thin Cup Decreased bolus cohesion;Premature spillage Oral - Thin Straw Decreased bolus cohesion;Premature spillage Oral - Puree Decreased bolus cohesion;Premature spillage Oral - Regular Decreased bolus cohesion;Premature spillage    03/28/2022  10:29 AM Pharyngeal Phase Pharyngeal Phase Impaired Pharyngeal- Honey Teaspoon Delayed swallow initiation-pyriform sinuses;Reduced tongue base retraction;Reduced anterior laryngeal mobility;Pharyngeal residue - pyriform;Pharyngeal residue - valleculae Pharyngeal- Nectar Cup Delayed swallow initiation-pyriform sinuses;Reduced tongue base retraction;Reduced anterior laryngeal mobility;Pharyngeal residue - pyriform;Pharyngeal residue - valleculae Pharyngeal-  Nectar Straw Delayed swallow initiation-pyriform sinuses;Reduced tongue base retraction;Reduced anterior laryngeal mobility;Pharyngeal residue - pyriform;Pharyngeal residue - valleculae;Penetration/Aspiration during swallow;Penetration/Aspiration before swallow Pharyngeal Material enters airway, remains ABOVE vocal cords and not ejected out Pharyngeal- Thin Cup Delayed swallow initiation-pyriform sinuses;Reduced tongue base retraction;Reduced anterior laryngeal mobility;Pharyngeal residue - pyriform;Pharyngeal residue - valleculae;Penetration/Aspiration during swallow;Penetration/Aspiration before swallow;Penetration/Apiration after swallow Pharyngeal Material enters airway, CONTACTS cords and not ejected out;Material enters airway, passes BELOW  cords and not ejected out despite cough attempt by patient Pharyngeal- Thin Straw Delayed swallow initiation-pyriform sinuses;Reduced tongue base retraction;Reduced anterior laryngeal mobility;Pharyngeal residue - pyriform;Pharyngeal residue - valleculae;Penetration/Aspiration during swallow;Penetration/Aspiration before swallow;Penetration/Apiration after swallow Pharyngeal Material enters airway, passes BELOW cords and not ejected out despite cough attempt by patient Pharyngeal- Puree Delayed swallow initiation-pyriform sinuses;Reduced tongue base retraction;Reduced anterior laryngeal mobility;Pharyngeal residue - pyriform;Pharyngeal residue - valleculae Pharyngeal- Regular Delayed swallow initiation-pyriform sinuses;Reduced tongue base retraction;Reduced anterior laryngeal mobility;Pharyngeal residue - pyriform;Pharyngeal residue - valleculae    03/28/2022  10:29 AM Cervical Esophageal Phase  Cervical Esophageal Phase [facility] Shanika I. Hardin Negus, [place], [place] Office number [phone] [name] 03/28/2022, 11:23 AM                     VAS US CAROTID  Result Date: 03/26/2022 Carotid Arterial Duplex Study Patient Name:  ALIJAH AKRAM   Date of Exam:   03/24/2022 Medical Rec #: [id number]       Accession #:    [id number] Date of Birth: 1940/07/04      Patient Gender: M Patient Age:   70 years Exam Location:  [facility] Procedure:      VAS US CAROTID Referring Phys: Deno Etienne [facility] --------------------------------------------------------------------------------  Indications:       CVA. Risk Factors:      Hypertension, hyperlipidemia, past history of smoking,                    coronary artery disease. Other Factors:     ESRD. Comparison Study:  No prior studies. Performing Technologist: Darlin Coco RDMS, RVT  Examination Guidelines: Winni Ehrhard complete evaluation includes B-mode imaging, spectral Doppler, color Doppler, and power Doppler as needed of all accessible portions of each vessel. Bilateral testing is considered an integral part of Jorden Minchey complete examination. Limited examinations for reoccurring indications may be performed as noted.  Right Carotid Findings: +----------+--------+--------+--------+------------------+------------------+           PSV cm/sEDV cm/sStenosisPlaque DescriptionComments           +----------+--------+--------+--------+------------------+------------------+ CCA Prox  65      10                                                   +----------+--------+--------+--------+------------------+------------------+ CCA Distal39      6                                 intimal thickening +----------+--------+--------+--------+------------------+------------------+ ICA Prox  56      14                                                   +----------+--------+--------+--------+------------------+------------------+ ICA Mid   83      27                                                   +----------+--------+--------+--------+------------------+------------------+ ICA Distal75      20                                                    +----------+--------+--------+--------+------------------+------------------+  ECA       38                                                           +----------+--------+--------+--------+------------------+------------------+ +----------+--------+-------+----------------+-------------------+           PSV cm/sEDV cmsDescribe        Arm Pressure (mmHG) +----------+--------+-------+----------------+-------------------+ [id number]             Multiphasic, WNL                    +----------+--------+-------+----------------+-------------------+ +---------+--------+--+--------+--+---------+ VertebralPSV cm/s48EDV cm/s10Antegrade +---------+--------+--+--------+--+---------+  Left Carotid Findings: +----------+--------+--------+--------+------------------+------------------+           PSV cm/sEDV cm/sStenosisPlaque DescriptionComments           +----------+--------+--------+--------+------------------+------------------+ CCA Prox  87      11                                                   +----------+--------+--------+--------+------------------+------------------+ CCA Distal53      9                                 intimal thickening +----------+--------+--------+--------+------------------+------------------+ ICA Prox  53      16                                tortuous           +----------+--------+--------+--------+------------------+------------------+ ICA Distal106     23                                                   +----------+--------+--------+--------+------------------+------------------+ ECA       55                                                           +----------+--------+--------+--------+------------------+------------------+ +----------+--------+--------+----------------+-------------------+           PSV cm/sEDV cm/sDescribe        Arm Pressure (mmHG) +----------+--------+--------+----------------+-------------------+  [id number]              Multiphasic, WNL                    +----------+--------+--------+----------------+-------------------+ +---------+--------+--+--------+--+---------+ VertebralPSV cm/s40EDV cm/s14Antegrade +---------+--------+--+--------+--+---------+   Summary: Right Carotid: The extracranial vessels were near-normal with only minimal wall                thickening or plaque. Left Carotid: The extracranial vessels were near-normal with only minimal wall               thickening or plaque. Vertebrals:  Bilateral vertebral arteries demonstrate antegrade flow. Subclavians: Normal flow hemodynamics were seen in bilateral subclavian  arteries. *See table(s) above for measurements and observations.  Electronically signed by Antony Contras MD on 03/26/2022 at 3:47:07 PM.    Final    DG CHEST PORT 1 VIEW  Result Date: 03/25/2022 CLINICAL DATA:  Central line placement EXAM: PORTABLE CHEST 1 VIEW COMPARISON:  Chest radiograph 03/23/2022 FINDINGS: Monitoring leads overlie the patient. New right IJ central venous catheter tip projects over the expected location of the superior vena cava. Stable cardiomegaly. Similar bilateral airspace opacities. Probable small left pleural effusion. No pneumothorax. Thoracic spine degenerative changes. IMPRESSION: New right IJ central venous catheter tip projects over the expected location of the superior vena cava. Electronically Signed   By: Lovey Newcomer M.D.   On: 03/25/2022 15:04   VAS [place] LOWER EXTREMITY VENOUS (DVT)  Result Date: 03/25/2022  Lower Venous DVT Study Patient Name:  MURAT RIDEOUT  Date of Exam:   03/24/2022 Medical Rec #: [id number]       Accession #:    [id number] Date of Birth: [birth year]      Patient Gender: M Patient Age:   39 years Exam Location:  [facility] Procedure:      VAS [place] LOWER EXTREMITY VENOUS (DVT) Referring Phys: Cornelius Moras XU --------------------------------------------------------------------------------   Indications: Stroke.  Risk Factors: None identified. Limitations: Poor ultrasound/tissue interface and patient positioning, patient movement. Comparison Study: No prior studies. Performing Technologist: Oliver Hum RVT  Examination Guidelines: Amer Alcindor complete evaluation includes B-mode imaging, spectral Doppler, color Doppler, and power Doppler as needed of all accessible portions of each vessel. Bilateral testing is considered an integral part of Lasha Echeverria complete examination. Limited examinations for reoccurring indications may be performed as noted. The reflux portion of the exam is performed with the patient in reverse Trendelenburg.  +---------+---------------+---------+-----------+----------+--------------+ RIGHT    CompressibilityPhasicitySpontaneityPropertiesThrombus Aging +---------+---------------+---------+-----------+----------+--------------+ CFV      Full           Yes      Yes                                 +---------+---------------+---------+-----------+----------+--------------+ SFJ      Full                                                        +---------+---------------+---------+-----------+----------+--------------+ FV Prox  Full                                                        +---------+---------------+---------+-----------+----------+--------------+ FV Mid   Full                                                        +---------+---------------+---------+-----------+----------+--------------+ FV DistalFull                                                        +---------+---------------+---------+-----------+----------+--------------+  PFV      Full                                                        +---------+---------------+---------+-----------+----------+--------------+ POP      Full           Yes      Yes                                 +---------+---------------+---------+-----------+----------+--------------+ PTV      Full                                                         +---------+---------------+---------+-----------+----------+--------------+ PERO     Full                                                        +---------+---------------+---------+-----------+----------+--------------+ SSV      None                                         Acute          +---------+---------------+---------+-----------+----------+--------------+   +---------+---------------+---------+-----------+----------+--------------+ LEFT     CompressibilityPhasicitySpontaneityPropertiesThrombus Aging +---------+---------------+---------+-----------+----------+--------------+ CFV      Full           Yes      Yes                                 +---------+---------------+---------+-----------+----------+--------------+ SFJ      Full                                                        +---------+---------------+---------+-----------+----------+--------------+ FV Prox  Full                                                        +---------+---------------+---------+-----------+----------+--------------+ FV Mid   Full                                                        +---------+---------------+---------+-----------+----------+--------------+ FV DistalFull                                                        +---------+---------------+---------+-----------+----------+--------------+  PFV      Full                                                        +---------+---------------+---------+-----------+----------+--------------+ POP      None           No       No                   Acute          +---------+---------------+---------+-----------+----------+--------------+ PTV      Full                                                        +---------+---------------+---------+-----------+----------+--------------+ PERO     None                                         Acute           +---------+---------------+---------+-----------+----------+--------------+     Summary: RIGHT: - Findings consistent with acute superficial vein thrombosis involving the right small saphenous vein. - There is no evidence of deep vein thrombosis in the lower extremity.  - No cystic structure found in the popliteal fossa.  LEFT: - Findings consistent with acute deep vein thrombosis involving the left popliteal vein, and left peroneal veins. - No cystic structure found in the popliteal fossa.  *See table(s) above for measurements and observations. Electronically signed by Harold Barban MD on 03/25/2022 at 2:17:21 PM.    Final    DG Abd Portable 1V  Result Date: 03/25/2022 CLINICAL DATA:  Nausea. EXAM: PORTABLE ABDOMEN - 1 VIEW COMPARISON:  03/08/2022 FINDINGS: No gaseous bowel dilatation. Peritoneal dialysis catheter evident. Density over the central pelvis is compatible with Justina Bertini distended bladder. IMPRESSION: No evidence for bowel obstruction. No findings to explain the patient's history of pain. Electronically Signed   By: Misty Stanley M.D.   On: 03/25/2022 11:54   ECHOCARDIOGRAM LIMITED BUBBLE STUDY  Result Date: 03/24/2022    ECHOCARDIOGRAM LIMITED REPORT   Patient Name:   MOUSTAFA MOSSA Date of Exam: 03/24/2022 Medical Rec #:  [id number]      Height:       72.0 in Accession #:    [id number]     Weight:       181.0 lb Date of Birth:  [birth year]     BSA:          2.042 m Patient Age:    40 years       BP:           114/76 mmHg Patient Gender: M              HR:           75 bpm. Exam Location:  Inpatient Procedure: Limited Echo and Limited Color Doppler Indications:    Stroke I63.9  History:        Patient has prior history of Echocardiogram examinations, most                 recent 03/04/2022. CHF,  Previous Myocardial Infarction and CAD;                 Risk Factors:Hypertension and Dyslipidemia. ESRD.  Sonographer:    Ronny Flurry Referring Phys: [place]  1. Left  ventricular ejection fraction, by estimation, is 25 to 30%. The left ventricle has severely decreased function. The left ventricle demonstrates global hypokinesis. The left ventricular internal cavity size was severely dilated. There is mild left ventricular hypertrophy. Left ventricular diastolic parameters are indeterminate.  2. Right ventricular systolic function is moderately reduced. The right ventricular size is mildly enlarged. Tricuspid regurgitation signal is inadequate for assessing PA pressure.  3. The mitral valve is abnormal. Moderate mitral valve regurgitation. Appears functional  4. The aortic valve was not well visualized. Aortic valve regurgitation is mild.  5. Aortic dilatation noted. Aneurysm of the aortic root, measuring 48 mm.  6. The inferior vena cava is dilated in size with <50% respiratory variability, suggesting right atrial pressure of 15 mmHg.  7. Agitated saline contrast bubble study was positive with shunting observed after >6 cardiac cycles suggestive of intrapulmonary shunting. FINDINGS  Left Ventricle: Left ventricular ejection fraction, by estimation, is 25 to 30%. The left ventricle has severely decreased function. The left ventricle demonstrates global hypokinesis. The left ventricular internal cavity size was severely dilated. There is mild left ventricular hypertrophy. Left ventricular diastolic parameters are indeterminate. Right Ventricle: The right ventricular size is mildly enlarged. Right ventricular systolic function is moderately reduced. Tricuspid regurgitation signal is inadequate for assessing PA pressure. Mitral Valve: The mitral valve is abnormal. Moderate mitral valve regurgitation. Aortic Valve: The aortic valve was not well visualized. Aortic valve regurgitation is mild. Aorta: Aortic dilatation noted. There is an aneurysm involving the aortic root measuring 48 mm. Venous: The inferior vena cava is dilated in size with less than 50% respiratory variability,  suggesting right atrial pressure of 15 mmHg. IAS/Shunts: Agitated saline contrast bubble study was positive with shunting observed after >6 cardiac cycles suggestive of intrapulmonary shunting. LEFT VENTRICLE PLAX 2D LVIDd:         6.90 cm   Diastology LVIDs:         6.50 cm   LV e' medial:  3.45 cm/s LV PW:         1.10 cm   LV e' lateral: 5.40 cm/s LV IVS:        0.90 cm LVOT diam:     2.10 cm LVOT Area:     3.46 cm  RIGHT VENTRICLE             IVC RV S prime:     11.90 cm/s  IVC diam: 2.30 cm LEFT ATRIUM         Index LA diam:    5.10 cm 2.50 cm/m   AORTA Ao Root diam: 4.80 cm Ao Asc diam:  3.60 cm  SHUNTS Systemic Diam: 2.10 cm Oswaldo Milian MD Electronically signed by Oswaldo Milian MD Signature Date/Time: 03/24/2022/5:26:35 PM    Final    VAS [place] TRANSCRANIAL DOPPLER W BUBBLES  Result Date: 03/24/2022  Transcranial Doppler with Bubble Patient Name:  ZAYLEN SUSMAN  Date of Exam:   03/24/2022 Medical Rec #: [id number]       Accession #:    [id number] Date of Birth: 20-May-1940      Patient Gender: M Patient Age:   46 years Exam Location:  [facility] Procedure:      VAS [place] TRANSCRANIAL DOPPLER W BUBBLES  Referring Phys: Rosalin Hawking --------------------------------------------------------------------------------  Indications: Stroke. History: Positive for lower extremity DVT. Performing Technologist: Darlin Coco RDMS, RVT  Examination Guidelines: Yaretzy Olazabal complete evaluation includes B-mode imaging, spectral Doppler, color Doppler, and power Doppler as needed of all accessible portions of each vessel. Bilateral testing is considered an integral part of Cleave Ternes complete examination. Limited examinations for reoccurring indications may be performed as noted.  Summary: No HITS at rest or during Valsalva. Negative transcranial Doppler Bubble study with no evidence of right to left intracardiac communication.  Karmine Kauer vascular evaluation was performed. The right middle cerebral artery was studied. An IV was  inserted into the patient's left antecubital. Verbal informed consent was obtained.  *See table(s) above for TCD measurements and observations.  Diagnosing physician: Rosalin Hawking MD Electronically signed by Rosalin Hawking MD on 03/24/2022 at 5:16:55 PM.    Final    MR BRAIN WO CONTRAST  Result Date: 03/23/2022 CLINICAL DATA:  Neuro deficit, acute, stroke suspected.  Weakness. EXAM: MRI HEAD WITHOUT CONTRAST TECHNIQUE: Multiplanar, multiecho pulse sequences of the brain and surrounding structures were obtained without intravenous contrast was attempted. Only diffusion images were acquired due to patient's inability to tolerate the exam. COMPARISON:  Head CT 03/23/2022. FINDINGS: Brain: Acute infarct in the medial left occipital lobe extending into the tail of the left hippocampus and posterior aspect of the left parahippocampal gyrus. No other areas of abnormal restricted diffusion. Vascular: No abnormal diffusion signal. Skull and upper cervical spine: No abnormal diffusion signal. Sinuses/Orbits: No abnormal diffusion signal. Other: None. IMPRESSION: Only diffusion images were acquired due to the patient's inability to tolerate the exam. 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 No other areas of restricted diffusion. Electronically Signed   By: Emmit Alexanders M.D.   On: 03/23/2022 14:04   CT HEAD WO CONTRAST (5MM)  Result Date: 03/23/2022 CLINICAL DATA:  Delirium EXAM: CT HEAD WITHOUT CONTRAST TECHNIQUE: Contiguous axial images were obtained from the base of the skull through the vertex without intravenous contrast. RADIATION DOSE REDUCTION: This exam was performed according to the departmental dose-optimization program which includes automated exposure control, adjustment of the mA and/or kV according to patient size and/or use of iterative reconstruction technique. COMPARISON:  CT head 01/21/2019 FINDINGS: Brain: There is hypodensity in the left occipital lobe suspicious for subacute infarct. There is possible  trace petechial hemorrhage (3-15) but no mass effect. There is no acute intracranial hemorrhage or extra-axial fluid collection. Parenchymal volume is within expected limits for age. The ventricles are stable in size. Patchy and confluent hypodensity throughout the remainder of the supratentorial white matter likely reflects sequela of chronic small-vessel ischemic change. There is Bransyn Adami small remote infarct in the right cerebellar hemisphere. The pituitary and suprasellar region are normal. There is no mass lesion. There is no mass effect or midline shift. Vascular: There is calcification of the bilateral carotid siphons and vertebral arteries. Skull: Normal. Negative for fracture or focal lesion. Sinuses/Orbits: The imaged paranasal sinuses are clear. Bilateral lens implants are in place. The globes and orbits are otherwise unremarkable. Other: None. IMPRESSION: Suspected subacute infarct in the left occipital lobe with possible petechial hemorrhage but no mass effect. Recommend brain MRI for better evaluation. These results will be called to the ordering clinician or representative by the Radiologist Assistant, and communication documented in the PACS or [employer]. Electronically Signed   By: Valetta Mole M.D.   On: 03/23/2022 09:38   DG Chest Port 1 View  Result Date: 03/23/2022 CLINICAL DATA:  Shortness of breath.  Concern for pneumonia. EXAM: PORTABLE CHEST 1 VIEW COMPARISON:  03/22/2022 FINDINGS: Unchanged cardiac enlargement. Decreased left pleural effusion. Diffuse bilateral interstitial and airspace opacities are again noted. Compared with the previous exam there is been improved aeration to both lower lung zones. IMPRESSION: 1. Persistent bilateral interstitial and airspace opacities with improved aeration to both lower lung zones. 2. Decrease in left pleural effusion. Electronically Signed   By: Kerby Moors M.D.   On: 03/23/2022 06:29   DG Chest Portable 1 View  Result Date:  03/22/2022 CLINICAL DATA:  Lethargic.  Decreasing blood pressure EXAM: PORTABLE CHEST 1 VIEW COMPARISON:  03/15/2022 x-ray and older FINDINGS: Enlarged cardiopericardial silhouette with increasing edema and perihilar opacities. Small effusions, left-greater-than-right. No pneumothorax. Tortuous and ectatic aorta. Overlapping cardiac leads. IMPRESSION: Developing bilateral opacities with effusions, possible edema. Secondary infiltrate is possible. No pneumothorax. Enlarged cardiopericardial silhouette. Electronically Signed   By: Jill Side M.D.   On: 03/22/2022 16:20   DG CHEST PORT 1 VIEW  Result Date: 03/15/2022 CLINICAL DATA:  Follow-up exam. EXAM: PORTABLE CHEST 1 VIEW COMPARISON:  CXR 03/08/22 FINDINGS: No pleural effusion. No pneumothorax. Unchanged enlarged cardiac contours. Redemonstrated bilateral perihilar pulmonary opacities, improved from prior exam, and possibly suggestive improving infection or pulmonary edema. No displaced rib fractures. Visualized upper abdomen is unremarkable. IMPRESSION: Redemonstrated bilateral perihilar pulmonary opacities, improved from prior exam, and possibly suggestive of improving infection or pulmonary edema. Electronically Signed   By: Marin Roberts M.D.   On: 03/15/2022 10:12   CARDIAC CATHETERIZATION  Result Date: 03/14/2022   Mid LAD lesion is 85% stenosed.   Dist Cx lesion is 60% stenosed.   Prox Cx to Mid Cx lesion is 20% stenosed.   1st Diag lesion is 70% stenosed.   Teila Skalsky drug-eluting stent was successfully placed.   Post intervention, there is Talea Manges 0% residual stenosis. Successful percutaneous coronary intervention to an 85 to 90% mildly tortuous mid LAD stenosis rated with PTCA, [place], and ultimate stenting with Jewell Ryans 3.0 x 18 mm Onyx Frontier DES stent postdilated to 3.25 mm with the stenosis being reduced to 0%. RECOMMENDATION: DAPT for minimum of 1 year.  Medical therapy for concomitant CAD.  The patient is on peritoneal dialysis.  Aggressive  lipid-lowering therapy with target LDL less than 55.   CARDIAC CATHETERIZATION  Result Date: 03/12/2022 Conclusions: Multivessel coronary artery disease, as detailed below.  Most severe lesion is an 80-90% mid LAD stenosis.  There is also moderate to severe disease involving small branches (D1 and rPDA) as well as moderate stenosis affecting distal LCx and rPLAV. Moderately elevated left ventricular filling pressure (LVEDP 25-30 mmHg). Aborted attempt at PCI of the mid LAD due to inability to achieve therapeutic anticoagulation despite administration of 16,000 unit of IV heparin. Recommendations: Consider PCI to mid LAD as soon as tomorrow using alternative intraprocedural anticoagulation strategy (i.e. bivalirudin). Dual antiplatelet therapy with aspirin and clopidogrel for at least 12 months. Consider escalation of fluid removal with peritoneal dialysis in the setting of moderately elevated LVEDP. Aggressive secondary prevention of coronary artery disease. Nelva Bush, MD [facility]  CT CHEST ABDOMEN PELVIS WO CONTRAST  Result Date: 03/08/2022 CLINICAL DATA:  82 year old with shortness of breath and abdominal pain. End-stage renal disease on peritoneal dialysis. EXAM: CT CHEST, ABDOMEN AND PELVIS WITHOUT CONTRAST TECHNIQUE: Multidetector CT imaging of the chest, abdomen and pelvis was performed following the standard protocol without IV contrast. RADIATION DOSE REDUCTION: This exam was performed according to the departmental  dose-optimization program which includes automated exposure control, adjustment of the mA and/or kV according to patient size and/or use of iterative reconstruction technique. COMPARISON:  Chest radiograph 03/08/2022 and CT chest abdomen and pelvis 01/21/2019 FINDINGS: CT CHEST FINDINGS Cardiovascular: Extensive coronary artery calcifications. Heart size is slightly enlarged. No significant pericardial effusion. Normal caliber of the thoracic aorta with atherosclerotic  calcifications. Mediastinum/Nodes: Prominent mediastinal lymph nodes. Right paratracheal lymph node on sequence 3 image 19 measures up to 1.5 cm in the short axis and previously measured 0.7 cm. Limited evaluation for hilar lymph node enlargement due to the lack of intravascular contrast. No axillary lymph node enlargement. Lungs/Pleura: Small left pleural effusion and moderate sized right pleural effusion. Patchy confluent airspace densities in both lungs, most prominent in the upper lobes. Findings are suggestive for multifocal pneumonia. Musculoskeletal: No acute bone abnormality. CT ABDOMEN PELVIS FINDINGS Hepatobiliary: Small amount of perihepatic ascites. Cholecystectomy. No gross abnormality to the liver. Pancreas: Unremarkable. No pancreatic ductal dilatation or surrounding inflammatory changes. Spleen: Limited evaluation due to motion artifact. Small amount of perisplenic ascites. Adrenals/Urinary Tract: Normal appearance of the adrenal glands. Both kidneys are atrophic without hydronephrosis. Normal appearance of the urinary bladder containing fluid. Small renal lesions likely represent cysts and do not require dedicated follow-up. Stomach/Bowel: Diverticula involving the duodenum near the ampulla. No evidence for bowel dilatation or obstruction. Colonic diverticula without acute bowel inflammation. Vascular/Lymphatic: Diffuse atherosclerotic calcifications in the abdominal aorta and iliac arteries. Negative for abdominal aortic aneurysm. No lymph node enlargement in the abdomen or pelvis. Reproductive: Prostate is prominent for size. Normal appearance of seminal vesicles. Other: Peritoneal dialysis catheter is coiled in the right anterior pelvis. Small amount of ascites or free fluid in the pelvis. Negative for free air. Again noted is Neelah Mannings lobulated soft tissue structure containing fat in the right anterior abdomen measuring up to 7.5 cm on image 73/3. This is [place] chronic finding and probably represent prior  inflammation or infarction in the omentum. Left inguinal hernia containing fat. Musculoskeletal: No acute bone abnormality. IMPRESSION: 1. Patchy airspace disease in both lungs is suggestive for multifocal pneumonia. 2. Bilateral pleural effusions, right side greater than left. 3. Small amount of ascites associated with the peritoneal dialysis catheter. 4. Prominent mediastinal lymph nodes are likely reactive but nonspecific. 5. Aortic Atherosclerosis (ICD10-I70.0). 6. Chronic fatty lesion in the right anterior abdomen is likely related to Destiney Sanabia remote omental infarct. Electronically Signed   By: Markus Daft M.D.   On: 03/08/2022 17:06   DG Abd Portable 1V  Result Date: 03/08/2022 CLINICAL DATA:  Dyspnea EXAM: PORTABLE ABDOMEN - 1 VIEW COMPARISON:  03/04/2022 FINDINGS: Bowel gas pattern is nonspecific. Peritoneal dialysis catheter is seen with its tip in the right side of pelvis. Arterial calcifications are seen, especially prominent in tortuous splenic artery. Surgical clips are seen in right upper quadrant. IMPRESSION: Nonspecific bowel gas pattern. Electronically Signed   By: Elmer Picker M.D.   On: 03/08/2022 16:02   DG CHEST PORT 1 VIEW  Result Date: 03/08/2022 CLINICAL DATA:  Dyspnea EXAM: PORTABLE CHEST 1 VIEW COMPARISON:  Previous studies including the examination of 03/07/2022 FINDINGS: Transverse diameter of heart is increased. There is interval worsening of pulmonary vascular congestion. Increased interstitial and alveolar markings are seen in parahilar regions and lower lung fields. Lateral CP angles are clear. There is no pneumothorax. IMPRESSION: Cardiomegaly. Central pulmonary vessels are prominent suggesting CHF. Possibility of underlying pneumonia is not excluded. Electronically Signed   By: Prudy Feeler.D.  On: 03/08/2022 16:01   DG CHEST PORT 1 VIEW  Result Date: 03/07/2022 CLINICAL DATA:  Acute respiratory failure EXAM: PORTABLE CHEST 1 VIEW COMPARISON:  03/06/2022 FINDINGS:  Stable perihilar airspace opacities. Borderline enlargement of the cardiopericardial silhouette. Minimal blunting of the left lateral costophrenic angle. Right lateral seventh rib deformity compatible with age-indeterminate fracture. Atherosclerotic calcification of the aortic arch. IMPRESSION: 1. Stable perihilar airspace opacities favoring pulmonary edema over pneumonia. 2. Borderline enlargement of the cardiopericardial silhouette. 3. Right lateral seventh rib deformity compatible with age-indeterminate fracture. 4. Minimal blunting of the left lateral costophrenic angle, possibly from Carisma Troupe trace left pleural effusion or pleural thickening. Electronically Signed   By: Van Clines M.D.   On: 03/07/2022 09:34   DG CHEST PORT 1 VIEW  Result Date: 03/06/2022 CLINICAL DATA:  Shortness of breath. EXAM: PORTABLE CHEST 1 VIEW COMPARISON:  03/04/2022 FINDINGS: Interval removal of left IJ catheter, endotracheal tube catheter endotracheal tube, and enteric tube. Stable cardiac enlargement. Multifocal airspace disease throughout the right lung is unchanged from previous exam. New left upper lobe airspace opacity. Partial improved aeration to the left lung base. Small pleural effusions suspected. IMPRESSION: 1. Interval removal of support apparatus. 2. New left upper lobe airspace opacity. 3. Improved aeration to the left lung base. Electronically Signed   By: Kerby Moors M.D.   On: 03/06/2022 18:13   DG CHEST PORT 1 VIEW  Result Date: 03/05/2022 CLINICAL DATA:  Respiratory failure EXAM: PORTABLE CHEST 1 VIEW COMPARISON:  03/04/2022 FINDINGS: Diffuse opacity right hemithorax again noted. Left base consolidation. Vascular congestion. Probable left-sided pleural effusion. No pneumothorax identified. NG tube below the diaphragm and off x-ray. Endotracheal tube tip just below thoracic inlet. Left IJ CVC tip distal SVC. IMPRESSION: Diffuse opacity right hemithorax and left base consolidation or volume loss.  Electronically Signed   By: Sammie Bench M.D.   On: 03/05/2022 11:11   DG Abd 1 View  Result Date: 03/04/2022 CLINICAL DATA:  Peritoneal dialysis EXAM: ABDOMEN - 1 VIEW COMPARISON:  03/03/2022 FINDINGS: Enteric tube is coiled within the stomach. Tip of the peritoneal dialysis catheter is coiled within the low right pelvis. Nonobstructive bowel gas pattern. Cholecystectomy clips. Calcified splenic artery. IMPRESSION: Tip of the peritoneal dialysis catheter is coiled within the low right pelvis. Electronically Signed   By: Davina Poke D.O.   On: 03/04/2022 14:12   DG Chest Port 1 View  Result Date: 03/04/2022 CLINICAL DATA:  Endotracheal tube in-situ. EXAM: PORTABLE CHEST 1 VIEW COMPARISON:  One-view chest x-ray 03/03/2022 FINDINGS: Heart is enlarged. Atherosclerotic changes are present the aortic arch. Endotracheal tube is stable, 5 cm above the carina. Left IJ line terminates at the cavoatrial junction. Diffuse interstitial and airspace opacities are present throughout the right lung, increasing in the right lower lobe. Perihilar opacities bilaterally are similar the prior study. Retrocardiac opacity is increasing. Bilateral pleural effusions are increasing. IMPRESSION: 1. Increasing interstitial and airspace opacities bilaterally consistent with multifocal pneumonia and probable pulmonary edema. 2. Increasing bilateral effusions. 3. Stable support apparatus. Electronically Signed   By: San Morelle M.D.   On: 03/04/2022 09:53   ECHOCARDIOGRAM COMPLETE  Result Date: 03/04/2022    ECHOCARDIOGRAM REPORT   Patient Name:   Jigar Zielke Date of Exam: 03/04/2022 Medical Rec #:  [id number]      Height:       72.0 in Accession #:    [id number]     Weight:       190.9 lb Date of Birth:  1940/06/21     BSA:          2.089 m Patient Age:    81 years       BP:           98/58 mmHg Patient Gender: M              HR:           51 bpm. Exam Location:  Inpatient Procedure: 2D Echo, Cardiac Doppler  and Color Doppler                       STAT ECHO Reported to: Dr Phineas Inches on 03/04/2022 7:47:00 AM. Indications:    Cardiomyopathy-ischemic  History:        Patient has prior history of Echocardiogram examinations, most                 recent 03/29/2011. Risk Factors:Hypertension and Dyslipidemia.                 ESRD.  Sonographer:    Clayton Lefort RDCS (AE) Referring Phys: Frederik Pear  Sonographer Comments: Echo performed with patient supine and on artificial respirator. IMPRESSIONS  1. Left ventricular ejection fraction, by estimation, is 40 to 45%. The left ventricle has mildly decreased function. The left ventricle demonstrates global hypokinesis. There is mild left ventricular hypertrophy. Left ventricular diastolic parameters are consistent with Grade I diastolic dysfunction (impaired relaxation).  2. Right ventricular systolic function is mildly reduced. The right ventricular size is normal. Tricuspid regurgitation signal is inadequate for assessing PA pressure.  3. Moderate pleural effusion.  4. Mild mitral valve regurgitation.  5. Aortic valve regurgitation is mild.  6. The inferior vena cava is normal in size with <50% respiratory variability, suggesting right atrial pressure of 8 mmHg. Comparison(s): No prior Echocardiogram. FINDINGS  Left Ventricle: Left ventricular ejection fraction, by estimation, is 40 to 45%. The left ventricle has mildly decreased function. The left ventricle demonstrates global hypokinesis. The left ventricular internal cavity size was normal in size. There is  mild left ventricular hypertrophy. Left ventricular diastolic parameters are consistent with Grade I diastolic dysfunction (impaired relaxation). Right Ventricle: The right ventricular size is normal. Right ventricular systolic function is mildly reduced. Tricuspid regurgitation signal is inadequate for assessing PA pressure. Left Atrium: Left atrial size was normal in size. Right Atrium: Right atrial size was normal  in size. Pericardium: There is no evidence of pericardial effusion. Mitral Valve: Mild mitral valve regurgitation. Tricuspid Valve: Tricuspid valve regurgitation is not demonstrated. Aortic Valve: Aortic valve regurgitation is mild. Aortic regurgitation PHT measures 775 msec. Aortic valve mean gradient measures 4.0 mmHg. Aortic valve peak gradient measures 8.0 mmHg. Aortic valve area, by VTI measures 3.31 cm. Pulmonic Valve: Pulmonic valve regurgitation is mild. Aorta: The aortic root and ascending aorta are structurally normal, with no evidence of dilitation. Venous: The inferior vena cava is normal in size with less than 50% respiratory variability, suggesting right atrial pressure of 8 mmHg. IAS/Shunts: The interatrial septum was not well visualized. Additional Comments: There is Sylvia Helms moderate pleural effusion.  LEFT VENTRICLE PLAX 2D LVIDd:         6.50 cm      Diastology LVIDs:         4.90 cm      LV e' medial:    3.59 cm/s LV PW:         2.10 cm      LV E/e' medial:  17.0 LV IVS:        1.40 cm      LV e' lateral:   5.44 cm/s LVOT diam:     2.50 cm      LV E/e' lateral: 11.2 LV SV:         95 LV SV Index:   45 LVOT Area:     4.91 cm  LV Volumes (MOD) LV vol d, MOD A2C: 258.0 ml LV vol d, MOD A4C: 186.0 ml LV vol s, MOD A2C: 139.0 ml LV vol s, MOD A4C: 112.0 ml LV SV MOD A2C:     119.0 ml LV SV MOD A4C:     186.0 ml LV SV MOD BP:      100.0 ml RIGHT VENTRICLE            IVC RV Basal diam:  3.20 cm    IVC diam: 2.10 cm RV S prime:     7.72 cm/s TAPSE (M-mode): 1.4 cm LEFT ATRIUM              Index        RIGHT ATRIUM           Index LA diam:        3.60 cm  1.72 cm/m   RA Area:     18.10 cm LA Vol (A2C):   101.0 ml 48.35 ml/m  RA Volume:   47.70 ml  22.84 ml/m LA Vol (A4C):   54.5 ml  26.09 ml/m LA Biplane Vol: 74.6 ml  35.71 ml/m  AORTIC VALVE AV Area (Vmax):    3.34 cm AV Area (Vmean):   3.54 cm AV Area (VTI):     3.31 cm AV Vmax:           141.00 cm/s AV Vmean:          87.000 cm/s AV VTI:             0.286 m AV Peak Grad:      8.0 mmHg AV Mean Grad:      4.0 mmHg LVOT Vmax:         95.90 cm/s LVOT Vmean:        62.700 cm/s LVOT VTI:          0.193 m LVOT/AV VTI ratio: 0.67 AI PHT:            775 msec  AORTA Ao Root diam: 3.50 cm Ao Asc diam:  3.80 cm MITRAL VALVE MV Area (PHT): 1.96 cm    SHUNTS MV Decel Time: 388 msec    Systemic VTI:  0.19 m MV E velocity: 61.20 cm/s  Systemic Diam: 2.50 cm Phineas Inches Electronically signed by Phineas Inches Signature Date/Time: 03/04/2022/8:04:15 AM    Final    DG Abd 1 View  Result Date: 03/03/2022 CLINICAL DATA:  Orogastric tube placement EXAM: ABDOMEN - 1 VIEW COMPARISON:  08/30/2021 FINDINGS: Distal course of orogastric tube is coiled within the fundus of the stomach. Tip of enteric tube is seen in the region of antrum of the stomach. There are surgical clips in right upper quadrant. Bowel gas pattern in the upper abdomen is unremarkable. Lower abdomen and pelvis are not included in the image. There is Everett Ricciardelli catheter in the mid abdomen suggesting possible peritoneal dialysis catheter. IMPRESSION: Tip of enteric tube is seen in the antrum of the stomach. Electronically Signed   By: Elmer Picker M.D.   On: 03/03/2022 18:48   DG CHEST PORT 1 VIEW  Result Date: 03/03/2022 CLINICAL DATA:  Evaluate endotracheal tube and enteric tube location EXAM: PORTABLE CHEST 1 VIEW COMPARISON:  Previous studies including the examination done earlier today FINDINGS: Transverse diameter of heart is increased. Tip of endotracheal tube is 5.6 cm above the carina. There is interval placement of left IJ central venous catheter with its tip in superior vena cava. Enteric tube is noted traversing the esophagus. There is large alveolar infiltrate in right upper lung field with possible interval worsening. There are faint alveolar densities in both parahilar regions and lower lung fields suggesting multifocal pneumonia and possibly pulmonary edema. There is blunting of lateral CP angles.  There is no pneumothorax. IMPRESSION: Dense alveolar infiltrate is seen in right upper lobe. There are alveolar densities in both parahilar regions and both lower lung fields with possible interval worsening suggesting multifocal pneumonia and possibly pulmonary edema. Small bilateral pleural effusions. Support devices as described in the body of the report. Electronically Signed   By: Elmer Picker M.D.   On: 03/03/2022 18:46   DG CHEST PORT 1 VIEW  Result Date: 03/03/2022 CLINICAL DATA:  Shortness of breath and cough. EXAM: PORTABLE CHEST 1 VIEW COMPARISON:  12/31/2021 FINDINGS: RIGHT UPPER lobe consolidation/airspace opacities noted, stable to slightly increased. New bilateral airspace opacities within the remainder of the lungs noted and may represent pneumonia and/or edema. There may be trace bilateral pleural effusions present. Cardiomediastinal silhouette is not significantly changed. Probable bibasilar atelectasis noted. IMPRESSION: 1. RIGHT UPPER lobe consolidation/airspace opacities, likely representing pneumonia. 2. New bilateral airspace opacities within the remainder of the lungs which may represent pneumonia and/or edema. Electronically Signed   By: Margarette Canada M.D.   On: 03/03/2022 15:05   DG Chest 2 View  Result Date: 03/02/2022 CLINICAL DATA:  Dyspnea EXAM: CHEST - 2 VIEW COMPARISON:  03/11/2021 FINDINGS: There is consolidation within the right upper lobe in keeping with changes of acute lobar pneumonia in the appropriate clinical setting. Left lung is clear. No pneumothorax or pleural effusion. Cardiac size within normal limits. No acute bone abnormality. IMPRESSION: 1. Right upper lobe pneumonia. Follow-up chest radiograph is recommended in 3-4 weeks to document resolution. Electronically Signed   By: Fidela Salisbury M.D.   On: 03/02/2022 22:00    Microbiology: Recent Results (from the past 240 hour(s))  Culture, blood (Routine x 2)     Status: None   Collection Time: 03/22/22   4:00 PM   Specimen: Left Antecubital; Blood  Result Value Ref Range Status   Specimen Description   Final    LEFT ANTECUBITAL BLOOD Performed at [facility], [address] [place], [place] [address]    Special Requests   Final    Blood Culture adequate volume BOTTLES DRAWN AEROBIC AND ANAEROBIC Performed at [facility], [address], [place], [place] [address]    Culture   Final    NO GROWTH 5 DAYS Performed at [facility], [place] [address] [place], [place] [address]    Report Status 03/27/2022 FINAL  Final  Culture, blood (Routine x 2)     Status: None   Collection Time: 03/22/22  4:05 PM   Specimen: BLOOD LEFT WRIST  Result Value Ref Range Status   Specimen Description   Final    BLOOD LEFT WRIST BLOOD Performed at [facility], [address] [place], [place] [address]    Special Requests   Final    Blood Culture adequate volume  BOTTLES DRAWN AEROBIC AND ANAEROBIC Performed at [facility], [address] [place], [place] [address]    Culture   Final    NO GROWTH 5 DAYS Performed at [facility], [place] [address], [place], [place] [address]    Report Status 03/27/2022 FINAL  Final  MRSA Next Gen by PCR, Nasal     Status: None   Collection Time: 03/23/22  7:26 AM   Specimen: Nasal Mucosa; Nasal Swab  Result Value Ref Range Status   MRSA by PCR Next Gen NOT DETECTED NOT DETECTED Final    Comment: (NOTE) The GeneXpert MRSA Assay (FDA approved for NASAL specimens only), is one component of [facility] comprehensive MRSA colonization surveillance program. It is not intended to diagnose MRSA infection nor to guide or monitor treatment for MRSA infections. Test performance is not FDA approved in patients less than 47 years old. Performed at [facility], [place] [address] [place], [place] [address]      Labs: Basic Metabolic Panel: Recent Labs  Lab 03/25/22 0245 03/26/22 0453 03/26/22 1600 03/27/22 0444  03/27/22 1631 03/28/22 0334 03/28/22 1613 03/29/22 0533  NA 138 139   < > 137  136 136 137 138 141  K 4.0 4.1   < > 4.2  4.2 4.3 4.4 4.2 4.4  CL 96* 102   < > 103  100 102 104 103 103  CO2 22 26   < > '25  25 25 24 23 '$ 18*  GLUCOSE 164* 97   < > 106*  107* 96 85 99 101*  BUN 87* 33*   < > '12  12 11 18 '$ 30* 42*  CREATININE 9.24* 3.58*   < > 1.65*  1.66* 1.38* 2.60* 3.83* 5.02*  CALCIUM 7.8* 7.5*   < > 7.9*  8.0* 7.8* 7.8* 7.8* 8.1*  MG 2.3 2.3  --  2.5*  --  2.6*  --  3.0*  PHOS  --  3.8   < > 2.0* 2.3* 3.2 4.2 6.0*   < > = values in this interval not displayed.   Liver Function Tests: Recent Labs  Lab 03/22/22 1557 03/26/22 0453 03/27/22 0444 03/27/22 1631 03/28/22 0334 03/28/22 1613 03/29/22 0533  AST 34  --   --   --   --   --   --   ALT 42  --   --   --   --   --   --   ALKPHOS 70  --   --   --   --   --   --   BILITOT 0.7  --   --   --   --   --   --   PROT 6.3*  --   --   --   --   --   --   ALBUMIN 2.6*   < > 2.2* 2.1* 2.1* 2.1* 2.2*   < > = values in this interval not displayed.   No results for input(s): "LIPASE", "AMYLASE" in the last 168 hours. Recent Labs  Lab 03/22/22 1657  AMMONIA 12   CBC: Recent Labs  Lab 03/24/22 0554 03/25/22 0245 03/26/22 0453 03/27/22 0444 03/28/22 0334 03/29/22 0533  WBC 7.3 8.1 6.2 6.8 8.1 9.5  NEUTROABS 5.9 6.7 4.9 5.4 6.5  --   HGB 9.3* 9.6* 8.8* 9.5* 9.3* 9.6*  HCT 29.2* 30.1* 28.1* 30.8* 29.1* 30.2*  MCV 105.4* 105.2* 107.3* 106.9* 104.7* 107.1*  PLT 223 258 247  278 263 265   Cardiac Enzymes: No results for input(s): "CKTOTAL", "CKMB", "CKMBINDEX", "TROPONINI" in the last 168 hours. BNP: BNP (last 3 results) Recent Labs    03/24/22 0554 03/25/22 0245 03/26/22 0453  BNP >4,500.0* >4,500.0* 2,426.8*    ProBNP (last 3 results) No results for input(s): "PROBNP" in the last 8760 hours.  CBG: Recent Labs  Lab 03/22/22 1556 03/23/22 1322  GLUCAP 150* 106*       Signed:  Fayrene Helper MD.   [facility] 03/29/2022, 2:16 PM

## 2022-03-29 NOTE — Progress Notes (Signed)
PD catheter flushed without difficulty.

## 2022-03-29 NOTE — Progress Notes (Signed)
This chaplain responded to the PMT NP-Shae consult for EOL spiritual care. The chaplain joined the NP-Shae on a family update. The chaplain understands the Pt. is transitioning to hospice at home today.  The chaplain introduced herself as a layer of supportive spiritual care in the hospital. The chaplain listened reflectively as the family briefly told stories of the Pt. 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  The chaplain understands clergy has visited the hospital with family and will continue to be a presence at home.     The chaplain invited the Pt. family to request a spiritual care presence through the RN if needed. Intercessory prayer was accepted by the family.  Chaplain Sallyanne Kuster [phone]

## 2022-03-29 NOTE — Progress Notes (Signed)
OT Cancellation Note  Patient Details Name: William [name] MRN: [id number] DOB: 12/18/1940   Cancelled Treatment:    Reason Eval/Treat Not Completed: Patient at procedure or test/ unavailable (Pt currently on HD.)  Malka So 03/29/2022, 8:49 AM Cleta Alberts, OTR/L Acute Rehabilitation Services Office: [phone]

## 2022-03-29 NOTE — Progress Notes (Signed)
[facility] Progress Note   Assessment/ Plan:   1 Acute hypoxic RF: recurrent PNA + some component of volume overload.  Being treated with vanc/ cefepime--> Unasyn  modified PD script seemed to be somewhat effective first night it was done but pt became more encephalopathic with lower EF- needs HD at least for short term- likely would not tolerate IHD with EF down, therefore CRRT started 1/21 to 1/23.  No heparin as is on Eliquis. IHD today. Will await outcomes of family meeting today before deciding on next step I.e. [facility] placement/next HD treatment. Tentatively planning on his next HD treatment to be on Sat 2 ESRD: on PD- as above in #1  CRRT 1/21-1/23. HD today, then likely Sat 3 Hypotension: midodrine 10 TID 4. 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 Metabolic Bone Disease:  renal vitamin with fluid restriction 6.  NSTEMI: s/p LHC 1/10, on ASA/ Plavix 7.  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 Per primary service Dispo: palliative consulted, ongoing discussions for [place], family meeting planned for noon  Subjective:    Patient seen and examined bedside. Son at bedside, discussed plan. About to start on HD. Family meeting planned with pall care at noon today.   Objective:   BP 128/76   Pulse 84   Temp (!) 97.5 F (36.4 C) (Axillary)   Resp (!) 32   Ht 6' (1.829 m)   Wt 74 kg   SpO2 97%   BMI 22.13 kg/m   Physical Exam: GEN sleepy/somnolent, sitting up in bed (bed in 'chair position') PULM CTA b/l CV RRR, no rubs heard ABD: soft, nontender, PD cath c/d/i EXT trace LE edema Neuro: sleepy, not really following commands ACCESS: right quadrant PD cath C/d/I, RIJ temp HD cath  Labs: BMET Recent Labs  Lab 03/26/22 0453 03/26/22 1600 03/27/22 0444 03/27/22 1631 03/28/22 0334 03/28/22 1613 03/29/22 0533  NA 139 136 137  136 136 137 138 141  K 4.1 4.8 4.2  4.2 4.3 4.4 4.2 4.4  CL 102 98 103  100 102 104 103 103  CO2 '26 25 25  25 25 24 23 '$ 18*   GLUCOSE 97 95 106*  107* 96 85 99 101*  BUN 33* '22 12  12 11 18 '$ 30* 42*  CREATININE 3.58* 2.66* 1.65*  1.66* 1.38* 2.60* 3.83* 5.02*  CALCIUM 7.5* 8.1* 7.9*  8.0* 7.8* 7.8* 7.8* 8.1*  PHOS 3.8 2.7 2.0* 2.3* 3.2 4.2 6.0*   CBC Recent Labs  Lab 03/25/22 0245 03/26/22 0453 03/27/22 0444 03/28/22 0334 03/29/22 0533  WBC 8.1 6.2 6.8 8.1 9.5  NEUTROABS 6.7 4.9 5.4 6.5  --   HGB 9.6* 8.8* 9.5* 9.3* 9.6*  HCT 30.1* 28.1* 30.8* 29.1* 30.2*  MCV 105.2* 107.3* 106.9* 104.7* 107.1*  PLT 258 247 278 263 265      Medications:     atorvastatin  20 mg Oral Daily   Chlorhexidine Gluconate Cloth  6 each Topical Daily   Chlorhexidine Gluconate Cloth  6 each Topical Q0600   clopidogrel  75 mg Oral Daily   darbepoetin (ARANESP) injection - DIALYSIS  60 mcg Subcutaneous Q Thu-1800   docusate sodium  100 mg Oral Daily   ezetimibe  10 mg Oral Daily   feeding supplement  237 mL Oral TID BM   midodrine  10 mg Oral TID WC   multivitamin  1 tablet Oral QHS   pantoprazole  40 mg Oral Daily  polyethylene glycol  17 g Oral Daily   QUEtiapine  25 mg Oral BID    Gean Quint, MD [facility] 03/29/2022, 8:04 AM

## 2022-03-29 NOTE — Progress Notes (Signed)
SLP Cancellation Note  Patient Details Name: William [name] MRN: [id number] DOB: [birth year]   Cancelled treatment:       Reason Eval/Treat Not Completed: Medical issues which prohibited therapy. SLP touched base with RN to discuss concern for aspiration this morning and overall plan of care. Per RN, plan per MD is to hold meds/POs pending [place] meeting scheduled for noon, so SLP will hold as well.   Note that pt appears to be having some fluctuating performance when it comes to swallowing, suspect related to fluctuating alertness and/or mentation. If it is within [place] to continue to feed by mouth despite this risk, would recommend continuing current diet recommended from MBS (Dys 2/nectar thick liquid) but with careful assistance with feeding and monitoring for appropriate times to offer POs. Would also continue to offer thorough, frequent oral care.     William [name]., M.A. [place] Office [phone]  Secure chat preferred  03/29/2022, 10:04 AM

## 2022-04-03 MED FILL — Thrombin-Sodium Carboxymethylcellulose-CaCl Pad 3" X 3": CUTANEOUS | Qty: 1 | Status: AC

## 2022-04-05 DEATH — deceased
